# Patient Record
Sex: Female | Born: 1935
Health system: Southern US, Community
[De-identification: ages and names within clinical notes are randomized; demographics above are authoritative.]

## PROBLEM LIST (undated history)

## (undated) ENCOUNTER — Emergency Department (HOSPITAL_COMMUNITY): Admission: EM | Payer: Medicare HMO | Source: Home / Self Care

## (undated) DIAGNOSIS — G43909 Migraine, unspecified, not intractable, without status migrainosus: Secondary | ICD-10-CM

## (undated) DIAGNOSIS — L821 Other seborrheic keratosis: Secondary | ICD-10-CM

## (undated) DIAGNOSIS — M25569 Pain in unspecified knee: Secondary | ICD-10-CM

## (undated) DIAGNOSIS — E039 Hypothyroidism, unspecified: Secondary | ICD-10-CM

## (undated) DIAGNOSIS — F329 Major depressive disorder, single episode, unspecified: Secondary | ICD-10-CM

## (undated) DIAGNOSIS — B9681 Helicobacter pylori [H. pylori] as the cause of diseases classified elsewhere: Secondary | ICD-10-CM

## (undated) DIAGNOSIS — I471 Supraventricular tachycardia, unspecified: Secondary | ICD-10-CM

## (undated) DIAGNOSIS — M1990 Unspecified osteoarthritis, unspecified site: Secondary | ICD-10-CM

## (undated) DIAGNOSIS — K589 Irritable bowel syndrome without diarrhea: Secondary | ICD-10-CM

## (undated) DIAGNOSIS — Z780 Asymptomatic menopausal state: Secondary | ICD-10-CM

## (undated) DIAGNOSIS — L309 Dermatitis, unspecified: Secondary | ICD-10-CM

## (undated) DIAGNOSIS — M5137 Other intervertebral disc degeneration, lumbosacral region: Secondary | ICD-10-CM

## (undated) DIAGNOSIS — R0602 Shortness of breath: Secondary | ICD-10-CM

## (undated) DIAGNOSIS — J189 Pneumonia, unspecified organism: Secondary | ICD-10-CM

## (undated) DIAGNOSIS — D229 Melanocytic nevi, unspecified: Secondary | ICD-10-CM

## (undated) DIAGNOSIS — N898 Other specified noninflammatory disorders of vagina: Secondary | ICD-10-CM

## (undated) DIAGNOSIS — E739 Lactose intolerance, unspecified: Secondary | ICD-10-CM

## (undated) DIAGNOSIS — R7611 Nonspecific reaction to tuberculin skin test without active tuberculosis: Secondary | ICD-10-CM

## (undated) DIAGNOSIS — I639 Cerebral infarction, unspecified: Secondary | ICD-10-CM

## (undated) DIAGNOSIS — F32A Depression, unspecified: Secondary | ICD-10-CM

## (undated) DIAGNOSIS — K921 Melena: Secondary | ICD-10-CM

## (undated) DIAGNOSIS — Z9109 Other allergy status, other than to drugs and biological substances: Secondary | ICD-10-CM

## (undated) DIAGNOSIS — R112 Nausea with vomiting, unspecified: Secondary | ICD-10-CM

## (undated) DIAGNOSIS — I10 Essential (primary) hypertension: Secondary | ICD-10-CM

## (undated) DIAGNOSIS — Z79890 Hormone replacement therapy (postmenopausal): Secondary | ICD-10-CM

## (undated) DIAGNOSIS — E785 Hyperlipidemia, unspecified: Secondary | ICD-10-CM

## (undated) DIAGNOSIS — K279 Peptic ulcer, site unspecified, unspecified as acute or chronic, without hemorrhage or perforation: Secondary | ICD-10-CM

## (undated) DIAGNOSIS — C539 Malignant neoplasm of cervix uteri, unspecified: Secondary | ICD-10-CM

## (undated) DIAGNOSIS — H43819 Vitreous degeneration, unspecified eye: Secondary | ICD-10-CM

## (undated) DIAGNOSIS — K219 Gastro-esophageal reflux disease without esophagitis: Secondary | ICD-10-CM

## (undated) DIAGNOSIS — K5730 Diverticulosis of large intestine without perforation or abscess without bleeding: Secondary | ICD-10-CM

## (undated) DIAGNOSIS — R350 Frequency of micturition: Secondary | ICD-10-CM

## (undated) DIAGNOSIS — M51379 Other intervertebral disc degeneration, lumbosacral region without mention of lumbar back pain or lower extremity pain: Secondary | ICD-10-CM

## (undated) DIAGNOSIS — Z9889 Other specified postprocedural states: Secondary | ICD-10-CM

## (undated) HISTORY — DX: Nonspecific reaction to tuberculin skin test without active tuberculosis: R76.11

## (undated) HISTORY — DX: Helicobacter pylori (H. pylori) as the cause of diseases classified elsewhere: B96.81

## (undated) HISTORY — DX: Peptic ulcer, site unspecified, unspecified as acute or chronic, without hemorrhage or perforation: K27.9

## (undated) HISTORY — DX: Hyperlipidemia, unspecified: E78.5

## (undated) HISTORY — DX: Melanocytic nevi, unspecified: D22.9

## (undated) HISTORY — DX: Irritable bowel syndrome, unspecified: K58.9

## (undated) HISTORY — DX: Cerebral infarction, unspecified: I63.9

## (undated) HISTORY — PX: CATARACT EXTRACTION W/ INTRAOCULAR LENS IMPLANT: SHX1309

## (undated) HISTORY — DX: Melena: K92.1

## (undated) HISTORY — DX: Essential (primary) hypertension: I10

## (undated) HISTORY — DX: Other seborrheic keratosis: L82.1

## (undated) HISTORY — DX: Hormone replacement therapy: Z79.890

## (undated) HISTORY — DX: Supraventricular tachycardia, unspecified: I47.10

## (undated) HISTORY — DX: Unspecified osteoarthritis, unspecified site: M19.90

## (undated) HISTORY — DX: Malignant neoplasm of cervix uteri, unspecified: C53.9

## (undated) HISTORY — DX: Asymptomatic menopausal state: Z78.0

## (undated) HISTORY — DX: Other specified noninflammatory disorders of vagina: N89.8

## (undated) HISTORY — PX: EYE SURGERY: SHX253

## (undated) HISTORY — DX: Supraventricular tachycardia: I47.1

## (undated) HISTORY — DX: Vitreous degeneration, unspecified eye: H43.819

## (undated) HISTORY — DX: Hypothyroidism, unspecified: E03.9

## (undated) HISTORY — DX: Pain in unspecified knee: M25.569

## (undated) HISTORY — PX: COLONOSCOPY: SHX174

## (undated) HISTORY — DX: Frequency of micturition: R35.0

## (undated) HISTORY — DX: Diverticulosis of large intestine without perforation or abscess without bleeding: K57.30

---

## 1980-04-17 HISTORY — PX: TOTAL ABDOMINAL HYSTERECTOMY W/ BILATERAL SALPINGOOPHORECTOMY: SHX83

## 1980-04-17 HISTORY — PX: ABDOMINAL HYSTERECTOMY: SHX81

## 1994-04-17 DIAGNOSIS — H43819 Vitreous degeneration, unspecified eye: Secondary | ICD-10-CM

## 1994-04-17 HISTORY — DX: Vitreous degeneration, unspecified eye: H43.819

## 1997-09-22 ENCOUNTER — Encounter: Admission: RE | Admit: 1997-09-22 | Discharge: 1997-09-22 | Payer: Self-pay | Admitting: Family Medicine

## 1997-09-30 ENCOUNTER — Encounter: Admission: RE | Admit: 1997-09-30 | Discharge: 1997-09-30 | Payer: Self-pay | Admitting: Family Medicine

## 1997-12-22 ENCOUNTER — Emergency Department (HOSPITAL_COMMUNITY): Admission: EM | Admit: 1997-12-22 | Discharge: 1997-12-22 | Payer: Self-pay | Admitting: Emergency Medicine

## 1998-02-03 ENCOUNTER — Encounter: Admission: RE | Admit: 1998-02-03 | Discharge: 1998-02-03 | Payer: Self-pay | Admitting: Family Medicine

## 1998-04-14 ENCOUNTER — Encounter: Admission: RE | Admit: 1998-04-14 | Discharge: 1998-04-14 | Payer: Self-pay | Admitting: Sports Medicine

## 1998-09-24 ENCOUNTER — Encounter: Admission: RE | Admit: 1998-09-24 | Discharge: 1998-09-24 | Payer: Self-pay | Admitting: Family Medicine

## 1998-12-22 ENCOUNTER — Encounter: Admission: RE | Admit: 1998-12-22 | Discharge: 1998-12-22 | Payer: Self-pay | Admitting: Family Medicine

## 1999-01-11 ENCOUNTER — Encounter: Admission: RE | Admit: 1999-01-11 | Discharge: 1999-01-11 | Payer: Self-pay | Admitting: Family Medicine

## 1999-02-01 ENCOUNTER — Encounter: Admission: RE | Admit: 1999-02-01 | Discharge: 1999-02-01 | Payer: Self-pay | Admitting: Sports Medicine

## 1999-04-05 ENCOUNTER — Encounter: Admission: RE | Admit: 1999-04-05 | Discharge: 1999-04-05 | Payer: Self-pay | Admitting: Sports Medicine

## 1999-05-20 ENCOUNTER — Encounter: Admission: RE | Admit: 1999-05-20 | Discharge: 1999-05-20 | Payer: Self-pay | Admitting: Family Medicine

## 1999-05-20 ENCOUNTER — Encounter: Payer: Self-pay | Admitting: Sports Medicine

## 1999-05-20 ENCOUNTER — Encounter: Admission: RE | Admit: 1999-05-20 | Discharge: 1999-05-20 | Payer: Self-pay | Admitting: *Deleted

## 1999-05-21 ENCOUNTER — Emergency Department (HOSPITAL_COMMUNITY): Admission: EM | Admit: 1999-05-21 | Discharge: 1999-05-21 | Payer: Self-pay | Admitting: Emergency Medicine

## 1999-05-22 ENCOUNTER — Emergency Department (HOSPITAL_COMMUNITY): Admission: EM | Admit: 1999-05-22 | Discharge: 1999-05-22 | Payer: Self-pay | Admitting: Emergency Medicine

## 1999-05-30 ENCOUNTER — Ambulatory Visit (HOSPITAL_COMMUNITY): Admission: RE | Admit: 1999-05-30 | Discharge: 1999-05-30 | Payer: Self-pay | Admitting: *Deleted

## 1999-07-22 ENCOUNTER — Ambulatory Visit (HOSPITAL_COMMUNITY): Admission: RE | Admit: 1999-07-22 | Discharge: 1999-07-22 | Payer: Self-pay | Admitting: Cardiology

## 1999-08-26 ENCOUNTER — Encounter: Payer: Self-pay | Admitting: Cardiology

## 1999-08-26 ENCOUNTER — Ambulatory Visit (HOSPITAL_COMMUNITY): Admission: RE | Admit: 1999-08-26 | Discharge: 1999-08-26 | Payer: Self-pay | Admitting: Cardiology

## 2000-03-23 ENCOUNTER — Encounter: Admission: RE | Admit: 2000-03-23 | Discharge: 2000-03-23 | Payer: Self-pay | Admitting: Family Medicine

## 2000-05-08 ENCOUNTER — Encounter: Admission: RE | Admit: 2000-05-08 | Discharge: 2000-05-08 | Payer: Self-pay | Admitting: Family Medicine

## 2000-05-25 ENCOUNTER — Encounter: Admission: RE | Admit: 2000-05-25 | Discharge: 2000-05-25 | Payer: Self-pay | Admitting: Family Medicine

## 2000-05-31 ENCOUNTER — Encounter: Payer: Self-pay | Admitting: Sports Medicine

## 2000-05-31 ENCOUNTER — Ambulatory Visit (HOSPITAL_COMMUNITY): Admission: RE | Admit: 2000-05-31 | Discharge: 2000-05-31 | Payer: Self-pay | Admitting: Sports Medicine

## 2001-06-27 ENCOUNTER — Ambulatory Visit (HOSPITAL_COMMUNITY): Admission: RE | Admit: 2001-06-27 | Discharge: 2001-06-27 | Payer: Self-pay | Admitting: Sports Medicine

## 2001-10-10 ENCOUNTER — Emergency Department (HOSPITAL_COMMUNITY): Admission: EM | Admit: 2001-10-10 | Discharge: 2001-10-11 | Payer: Self-pay | Admitting: Emergency Medicine

## 2001-10-11 ENCOUNTER — Encounter: Payer: Self-pay | Admitting: Emergency Medicine

## 2001-10-15 ENCOUNTER — Encounter (INDEPENDENT_AMBULATORY_CARE_PROVIDER_SITE_OTHER): Payer: Self-pay | Admitting: *Deleted

## 2001-10-15 LAB — CONVERTED CEMR LAB

## 2001-11-08 ENCOUNTER — Encounter: Admission: RE | Admit: 2001-11-08 | Discharge: 2001-11-08 | Payer: Self-pay | Admitting: Family Medicine

## 2002-05-29 ENCOUNTER — Encounter: Admission: RE | Admit: 2002-05-29 | Discharge: 2002-05-29 | Payer: Self-pay | Admitting: Family Medicine

## 2002-06-03 ENCOUNTER — Encounter: Payer: Self-pay | Admitting: Sports Medicine

## 2002-06-03 ENCOUNTER — Encounter: Admission: RE | Admit: 2002-06-03 | Discharge: 2002-06-03 | Payer: Self-pay | Admitting: Sports Medicine

## 2002-06-23 ENCOUNTER — Encounter: Admission: RE | Admit: 2002-06-23 | Discharge: 2002-06-23 | Payer: Self-pay | Admitting: Family Medicine

## 2003-03-11 ENCOUNTER — Encounter: Admission: RE | Admit: 2003-03-11 | Discharge: 2003-03-11 | Payer: Self-pay | Admitting: Family Medicine

## 2003-04-22 ENCOUNTER — Ambulatory Visit (HOSPITAL_COMMUNITY): Admission: RE | Admit: 2003-04-22 | Discharge: 2003-04-22 | Payer: Self-pay | Admitting: Family Medicine

## 2003-04-22 ENCOUNTER — Encounter: Admission: RE | Admit: 2003-04-22 | Discharge: 2003-04-22 | Payer: Self-pay | Admitting: Sports Medicine

## 2003-05-06 ENCOUNTER — Encounter: Admission: RE | Admit: 2003-05-06 | Discharge: 2003-05-06 | Payer: Self-pay | Admitting: Family Medicine

## 2004-05-20 ENCOUNTER — Ambulatory Visit: Payer: Self-pay | Admitting: Family Medicine

## 2004-05-22 ENCOUNTER — Emergency Department (HOSPITAL_COMMUNITY): Admission: EM | Admit: 2004-05-22 | Discharge: 2004-05-22 | Payer: Self-pay | Admitting: Family Medicine

## 2004-05-27 ENCOUNTER — Ambulatory Visit (HOSPITAL_COMMUNITY): Admission: RE | Admit: 2004-05-27 | Discharge: 2004-05-27 | Payer: Self-pay | Admitting: Family Medicine

## 2004-06-26 ENCOUNTER — Emergency Department (HOSPITAL_COMMUNITY): Admission: EM | Admit: 2004-06-26 | Discharge: 2004-06-26 | Payer: Self-pay | Admitting: Family Medicine

## 2004-08-09 ENCOUNTER — Ambulatory Visit: Payer: Self-pay | Admitting: Cardiology

## 2004-08-19 ENCOUNTER — Encounter: Payer: Self-pay | Admitting: Family Medicine

## 2004-08-19 ENCOUNTER — Ambulatory Visit: Payer: Self-pay

## 2004-12-22 ENCOUNTER — Emergency Department (HOSPITAL_COMMUNITY): Admission: EM | Admit: 2004-12-22 | Discharge: 2004-12-22 | Payer: Self-pay | Admitting: Emergency Medicine

## 2005-05-24 ENCOUNTER — Encounter: Admission: RE | Admit: 2005-05-24 | Discharge: 2005-05-24 | Payer: Self-pay | Admitting: Family Medicine

## 2005-05-24 ENCOUNTER — Ambulatory Visit: Payer: Self-pay | Admitting: Family Medicine

## 2005-07-10 ENCOUNTER — Ambulatory Visit (HOSPITAL_COMMUNITY): Admission: RE | Admit: 2005-07-10 | Discharge: 2005-07-10 | Payer: Self-pay | Admitting: Sports Medicine

## 2005-07-27 ENCOUNTER — Ambulatory Visit: Payer: Self-pay | Admitting: Family Medicine

## 2005-08-21 ENCOUNTER — Encounter: Admission: RE | Admit: 2005-08-21 | Discharge: 2005-08-21 | Payer: Self-pay | Admitting: Sports Medicine

## 2005-08-28 ENCOUNTER — Ambulatory Visit: Payer: Self-pay | Admitting: Sports Medicine

## 2005-08-28 ENCOUNTER — Ambulatory Visit: Payer: Self-pay | Admitting: Cardiology

## 2005-09-20 ENCOUNTER — Encounter: Admission: RE | Admit: 2005-09-20 | Discharge: 2005-12-19 | Payer: Self-pay | Admitting: Sports Medicine

## 2005-09-28 ENCOUNTER — Ambulatory Visit: Payer: Self-pay | Admitting: Family Medicine

## 2005-10-27 ENCOUNTER — Encounter: Admission: RE | Admit: 2005-10-27 | Discharge: 2005-10-27 | Payer: Self-pay | Admitting: Sports Medicine

## 2005-10-30 ENCOUNTER — Ambulatory Visit: Payer: Self-pay | Admitting: Family Medicine

## 2005-12-11 ENCOUNTER — Ambulatory Visit: Payer: Self-pay | Admitting: Family Medicine

## 2005-12-21 ENCOUNTER — Ambulatory Visit: Payer: Self-pay | Admitting: Family Medicine

## 2006-01-24 ENCOUNTER — Ambulatory Visit: Payer: Self-pay | Admitting: Sports Medicine

## 2006-02-15 ENCOUNTER — Ambulatory Visit: Payer: Self-pay | Admitting: Sports Medicine

## 2006-02-21 ENCOUNTER — Ambulatory Visit: Payer: Self-pay | Admitting: Family Medicine

## 2006-02-26 ENCOUNTER — Emergency Department (HOSPITAL_COMMUNITY): Admission: EM | Admit: 2006-02-26 | Discharge: 2006-02-26 | Payer: Self-pay | Admitting: Family Medicine

## 2006-06-14 DIAGNOSIS — I471 Supraventricular tachycardia, unspecified: Secondary | ICD-10-CM | POA: Insufficient documentation

## 2006-06-14 DIAGNOSIS — M170 Bilateral primary osteoarthritis of knee: Secondary | ICD-10-CM | POA: Insufficient documentation

## 2006-06-14 DIAGNOSIS — M1990 Unspecified osteoarthritis, unspecified site: Secondary | ICD-10-CM

## 2006-06-14 DIAGNOSIS — M5137 Other intervertebral disc degeneration, lumbosacral region: Secondary | ICD-10-CM | POA: Insufficient documentation

## 2006-06-14 DIAGNOSIS — M25569 Pain in unspecified knee: Secondary | ICD-10-CM | POA: Insufficient documentation

## 2006-06-14 HISTORY — DX: Supraventricular tachycardia, unspecified: I47.10

## 2006-06-15 ENCOUNTER — Encounter (INDEPENDENT_AMBULATORY_CARE_PROVIDER_SITE_OTHER): Payer: Self-pay | Admitting: *Deleted

## 2006-10-04 ENCOUNTER — Encounter (INDEPENDENT_AMBULATORY_CARE_PROVIDER_SITE_OTHER): Payer: Self-pay | Admitting: Family Medicine

## 2006-10-05 ENCOUNTER — Ambulatory Visit: Payer: Self-pay | Admitting: Family Medicine

## 2006-10-05 ENCOUNTER — Encounter (INDEPENDENT_AMBULATORY_CARE_PROVIDER_SITE_OTHER): Payer: Self-pay | Admitting: Family Medicine

## 2006-10-05 LAB — CONVERTED CEMR LAB: TSH: 4.135 microintl units/mL (ref 0.350–5.50)

## 2006-11-12 ENCOUNTER — Ambulatory Visit: Payer: Self-pay | Admitting: Cardiology

## 2007-02-08 ENCOUNTER — Ambulatory Visit: Payer: Self-pay | Admitting: Family Medicine

## 2007-02-08 ENCOUNTER — Encounter (INDEPENDENT_AMBULATORY_CARE_PROVIDER_SITE_OTHER): Payer: Self-pay | Admitting: Family Medicine

## 2007-02-08 LAB — CONVERTED CEMR LAB
Cholesterol: 228 mg/dL — ABNORMAL HIGH (ref 0–200)
LDL Cholesterol: 139 mg/dL — ABNORMAL HIGH (ref 0–99)
Triglycerides: 229 mg/dL — ABNORMAL HIGH (ref <150)

## 2007-02-11 ENCOUNTER — Telehealth (INDEPENDENT_AMBULATORY_CARE_PROVIDER_SITE_OTHER): Payer: Self-pay | Admitting: Family Medicine

## 2007-02-11 ENCOUNTER — Telehealth: Payer: Self-pay | Admitting: *Deleted

## 2007-02-21 ENCOUNTER — Ambulatory Visit: Payer: Self-pay | Admitting: Cardiology

## 2007-02-26 ENCOUNTER — Encounter (INDEPENDENT_AMBULATORY_CARE_PROVIDER_SITE_OTHER): Payer: Self-pay | Admitting: Family Medicine

## 2007-03-07 ENCOUNTER — Ambulatory Visit: Payer: Self-pay | Admitting: Cardiology

## 2007-03-28 ENCOUNTER — Ambulatory Visit: Payer: Self-pay | Admitting: Family Medicine

## 2007-04-26 ENCOUNTER — Ambulatory Visit: Payer: Self-pay | Admitting: Gastroenterology

## 2007-04-26 LAB — CONVERTED CEMR LAB
Basophils Relative: 0.5 % (ref 0.0–1.0)
Hemoglobin: 13.4 g/dL (ref 12.0–15.0)
Lymphocytes Relative: 38.1 % (ref 12.0–46.0)
MCV: 85.7 fL (ref 78.0–100.0)
Monocytes Absolute: 0.6 10*3/uL (ref 0.2–0.7)
Monocytes Relative: 8.8 % (ref 3.0–11.0)
Neutro Abs: 3.2 10*3/uL (ref 1.4–7.7)

## 2007-05-01 ENCOUNTER — Ambulatory Visit: Payer: Self-pay | Admitting: Cardiology

## 2007-05-01 LAB — CONVERTED CEMR LAB
ALT: 16 units/L (ref 0–35)
Alkaline Phosphatase: 59 units/L (ref 39–117)
Cholesterol: 156 mg/dL (ref 0–200)
LDL Cholesterol: 85 mg/dL (ref 0–99)
Total Protein: 7.1 g/dL (ref 6.0–8.3)
VLDL: 25 mg/dL (ref 0–40)

## 2007-05-06 ENCOUNTER — Encounter: Payer: Self-pay | Admitting: Gastroenterology

## 2007-05-06 ENCOUNTER — Encounter (INDEPENDENT_AMBULATORY_CARE_PROVIDER_SITE_OTHER): Payer: Self-pay | Admitting: Family Medicine

## 2007-05-06 ENCOUNTER — Ambulatory Visit: Payer: Self-pay | Admitting: Gastroenterology

## 2007-05-06 DIAGNOSIS — K5730 Diverticulosis of large intestine without perforation or abscess without bleeding: Secondary | ICD-10-CM | POA: Insufficient documentation

## 2007-05-21 ENCOUNTER — Ambulatory Visit: Payer: Self-pay | Admitting: Gastroenterology

## 2007-05-21 LAB — CONVERTED CEMR LAB
Fecal Occult Blood: NEGATIVE
OCCULT 3: NEGATIVE
OCCULT 4: NEGATIVE

## 2007-06-04 ENCOUNTER — Ambulatory Visit: Payer: Self-pay | Admitting: Cardiology

## 2007-11-14 ENCOUNTER — Ambulatory Visit: Payer: Self-pay | Admitting: Family Medicine

## 2007-11-14 ENCOUNTER — Telehealth: Payer: Self-pay | Admitting: *Deleted

## 2007-11-14 LAB — CONVERTED CEMR LAB
Glucose, Urine, Semiquant: NEGATIVE
Specific Gravity, Urine: 1.015
WBC Urine, dipstick: NEGATIVE
pH: 7.5

## 2007-11-18 ENCOUNTER — Ambulatory Visit: Payer: Self-pay | Admitting: Cardiology

## 2007-12-24 ENCOUNTER — Ambulatory Visit: Payer: Self-pay | Admitting: Family Medicine

## 2007-12-24 ENCOUNTER — Encounter (INDEPENDENT_AMBULATORY_CARE_PROVIDER_SITE_OTHER): Payer: Self-pay | Admitting: Family Medicine

## 2007-12-24 DIAGNOSIS — L821 Other seborrheic keratosis: Secondary | ICD-10-CM | POA: Insufficient documentation

## 2007-12-24 LAB — CONVERTED CEMR LAB
BUN: 10 mg/dL (ref 6–23)
Calcium: 9.1 mg/dL (ref 8.4–10.5)
Glucose, Bld: 87 mg/dL (ref 70–99)
TSH: 4.185 microintl units/mL (ref 0.350–4.50)

## 2007-12-25 ENCOUNTER — Encounter (INDEPENDENT_AMBULATORY_CARE_PROVIDER_SITE_OTHER): Payer: Self-pay | Admitting: Family Medicine

## 2008-01-22 ENCOUNTER — Ambulatory Visit: Payer: Self-pay | Admitting: Family Medicine

## 2008-05-13 ENCOUNTER — Ambulatory Visit: Payer: Self-pay | Admitting: Cardiology

## 2008-05-13 ENCOUNTER — Ambulatory Visit (HOSPITAL_COMMUNITY): Admission: RE | Admit: 2008-05-13 | Discharge: 2008-05-13 | Payer: Self-pay | Admitting: Family Medicine

## 2008-05-13 LAB — CONVERTED CEMR LAB
ALT: 13 units/L (ref 0–35)
AST: 20 units/L (ref 0–37)
Bilirubin, Direct: 0.1 mg/dL (ref 0.0–0.3)
Total Bilirubin: 0.5 mg/dL (ref 0.3–1.2)
VLDL: 17 mg/dL (ref 0–40)

## 2008-05-21 ENCOUNTER — Ambulatory Visit: Payer: Self-pay | Admitting: Cardiology

## 2008-05-25 ENCOUNTER — Ambulatory Visit: Payer: Self-pay | Admitting: Family Medicine

## 2008-05-25 LAB — CONVERTED CEMR LAB: Rapid Strep: NEGATIVE

## 2008-06-01 ENCOUNTER — Encounter (INDEPENDENT_AMBULATORY_CARE_PROVIDER_SITE_OTHER): Payer: Self-pay | Admitting: Family Medicine

## 2008-06-02 ENCOUNTER — Telehealth (INDEPENDENT_AMBULATORY_CARE_PROVIDER_SITE_OTHER): Payer: Self-pay | Admitting: Family Medicine

## 2008-06-03 ENCOUNTER — Encounter: Admission: RE | Admit: 2008-06-03 | Discharge: 2008-06-03 | Payer: Self-pay | Admitting: Family Medicine

## 2008-06-03 ENCOUNTER — Ambulatory Visit: Payer: Self-pay | Admitting: Family Medicine

## 2008-06-03 DIAGNOSIS — I10 Essential (primary) hypertension: Secondary | ICD-10-CM | POA: Insufficient documentation

## 2008-06-09 ENCOUNTER — Encounter: Payer: Self-pay | Admitting: *Deleted

## 2008-06-17 ENCOUNTER — Ambulatory Visit: Payer: Self-pay | Admitting: Family Medicine

## 2008-11-06 ENCOUNTER — Encounter (INDEPENDENT_AMBULATORY_CARE_PROVIDER_SITE_OTHER): Payer: Self-pay | Admitting: *Deleted

## 2008-12-15 ENCOUNTER — Ambulatory Visit: Payer: Self-pay | Admitting: Cardiology

## 2009-01-27 ENCOUNTER — Ambulatory Visit: Payer: Self-pay | Admitting: Family Medicine

## 2009-04-16 ENCOUNTER — Emergency Department (HOSPITAL_COMMUNITY): Admission: EM | Admit: 2009-04-16 | Discharge: 2009-04-16 | Payer: Self-pay | Admitting: Emergency Medicine

## 2009-06-08 ENCOUNTER — Ambulatory Visit: Payer: Self-pay | Admitting: Family Medicine

## 2009-06-23 ENCOUNTER — Ambulatory Visit: Payer: Self-pay

## 2009-06-23 ENCOUNTER — Encounter: Payer: Self-pay | Admitting: Family Medicine

## 2009-06-28 DIAGNOSIS — E559 Vitamin D deficiency, unspecified: Secondary | ICD-10-CM | POA: Insufficient documentation

## 2009-06-28 LAB — CONVERTED CEMR LAB
Albumin: 4.4 g/dL (ref 3.5–5.2)
Alkaline Phosphatase: 65 units/L (ref 39–117)
BUN: 7 mg/dL (ref 6–23)
CO2: 26 meq/L (ref 19–32)
Cholesterol: 143 mg/dL (ref 0–200)
Glucose, Bld: 98 mg/dL (ref 70–99)
HDL: 51 mg/dL (ref >39)
LDL Cholesterol: 71 mg/dL (ref 0–99)
Potassium: 4.7 meq/L (ref 3.5–5.3)
Total Bilirubin: 0.3 mg/dL (ref 0.3–1.2)
Triglycerides: 106 mg/dL (ref <150)

## 2009-07-26 ENCOUNTER — Ambulatory Visit (HOSPITAL_COMMUNITY): Admission: RE | Admit: 2009-07-26 | Discharge: 2009-07-26 | Payer: Self-pay | Admitting: Family Medicine

## 2009-10-08 ENCOUNTER — Encounter: Payer: Self-pay | Admitting: Family Medicine

## 2009-10-08 ENCOUNTER — Ambulatory Visit: Payer: Self-pay | Admitting: Family Medicine

## 2009-10-08 DIAGNOSIS — R0602 Shortness of breath: Secondary | ICD-10-CM | POA: Insufficient documentation

## 2009-10-14 ENCOUNTER — Encounter: Admission: RE | Admit: 2009-10-14 | Discharge: 2009-10-14 | Payer: Self-pay | Admitting: Family Medicine

## 2009-10-15 ENCOUNTER — Encounter: Payer: Self-pay | Admitting: Family Medicine

## 2009-10-26 ENCOUNTER — Ambulatory Visit: Payer: Self-pay | Admitting: Family Medicine

## 2009-10-26 ENCOUNTER — Encounter: Payer: Self-pay | Admitting: Family Medicine

## 2009-10-26 LAB — CONVERTED CEMR LAB
HCT: 38 % (ref 36.0–46.0)
Hemoglobin: 12.9 g/dL (ref 12.0–15.0)
MCV: 84.1 fL (ref 78.0–100.0)
Platelets: 179 10*3/uL (ref 150–400)
WBC: 7.2 10*3/uL (ref 4.0–10.5)

## 2009-11-30 ENCOUNTER — Ambulatory Visit: Payer: Self-pay | Admitting: Family Medicine

## 2010-01-12 ENCOUNTER — Ambulatory Visit: Payer: Self-pay | Admitting: Cardiology

## 2010-02-07 ENCOUNTER — Ambulatory Visit: Payer: Self-pay | Admitting: Family Medicine

## 2010-02-08 ENCOUNTER — Ambulatory Visit: Payer: Self-pay | Admitting: Family Medicine

## 2010-02-17 ENCOUNTER — Ambulatory Visit: Payer: Self-pay | Admitting: Family Medicine

## 2010-03-07 ENCOUNTER — Encounter: Payer: Self-pay | Admitting: *Deleted

## 2010-03-08 ENCOUNTER — Observation Stay (HOSPITAL_COMMUNITY): Admission: EM | Admit: 2010-03-08 | Discharge: 2010-03-08 | Payer: Self-pay | Admitting: Emergency Medicine

## 2010-03-08 ENCOUNTER — Encounter (INDEPENDENT_AMBULATORY_CARE_PROVIDER_SITE_OTHER): Payer: Self-pay | Admitting: Emergency Medicine

## 2010-03-08 ENCOUNTER — Telehealth: Payer: Self-pay | Admitting: Family Medicine

## 2010-03-21 ENCOUNTER — Encounter: Payer: Self-pay | Admitting: Family Medicine

## 2010-03-21 ENCOUNTER — Ambulatory Visit: Payer: Self-pay | Admitting: Family Medicine

## 2010-03-22 ENCOUNTER — Encounter: Admission: RE | Admit: 2010-03-22 | Discharge: 2010-03-22 | Payer: Self-pay | Admitting: Family Medicine

## 2010-03-25 ENCOUNTER — Telehealth: Payer: Self-pay | Admitting: Family Medicine

## 2010-03-31 ENCOUNTER — Telehealth: Payer: Self-pay | Admitting: Family Medicine

## 2010-03-31 ENCOUNTER — Telehealth: Payer: Self-pay | Admitting: *Deleted

## 2010-04-12 ENCOUNTER — Ambulatory Visit
Admission: RE | Admit: 2010-04-12 | Discharge: 2010-04-12 | Payer: Self-pay | Source: Home / Self Care | Attending: Family Medicine | Admitting: Family Medicine

## 2010-04-15 ENCOUNTER — Telehealth: Payer: Self-pay | Admitting: *Deleted

## 2010-04-19 ENCOUNTER — Telehealth: Payer: Self-pay | Admitting: *Deleted

## 2010-04-22 ENCOUNTER — Telehealth: Payer: Self-pay | Admitting: Family Medicine

## 2010-04-27 ENCOUNTER — Ambulatory Visit: Admission: RE | Admit: 2010-04-27 | Discharge: 2010-04-27 | Payer: Self-pay | Source: Home / Self Care

## 2010-05-02 ENCOUNTER — Encounter: Payer: Self-pay | Admitting: Family Medicine

## 2010-05-02 ENCOUNTER — Ambulatory Visit
Admission: RE | Admit: 2010-05-02 | Discharge: 2010-05-02 | Payer: Self-pay | Source: Home / Self Care | Attending: Family Medicine | Admitting: Family Medicine

## 2010-05-08 ENCOUNTER — Encounter: Payer: Self-pay | Admitting: Sports Medicine

## 2010-05-09 ENCOUNTER — Encounter
Admission: RE | Admit: 2010-05-09 | Discharge: 2010-05-09 | Payer: Self-pay | Source: Home / Self Care | Attending: Family Medicine | Admitting: Family Medicine

## 2010-05-16 ENCOUNTER — Ambulatory Visit
Admission: RE | Admit: 2010-05-16 | Discharge: 2010-05-16 | Payer: Self-pay | Source: Home / Self Care | Attending: Family Medicine | Admitting: Family Medicine

## 2010-05-17 NOTE — Assessment & Plan Note (Signed)
Summary: Meet new PCP/tcb   Vital Signs:  Patient profile:   77 year old female Weight:      148.1 pounds Temp:     97.7 degrees F oral Pulse rate:   57 / minute Pulse rhythm:   regular BP sitting:   145 / 84  (left arm) Cuff size:   regular  Vitals Entered By: Loralee Pacas CMA (June 08, 2009 1:52 PM)  Primary Care Provider:  Delbert Harness MD   History of Present Illness: 77 yo F here to meet new PCP for follow-up- has not been seen in over a year.  HRT:  Hysterectomy at age 77.  has been taking hormone therapy since then.  Has been tapering down.  has been out for 2 months.  Hot flashes have been not tolerable in this time period.  would like to restart.  HYPERTENSION/SVT Meds: Taking and tolerating? yes Home BP's: no Chest Pain: no Dyspnea: no SVT: No palpitations  Hypothyroid:  has been taking sythroid daily.  Has some fatigue but otherwise has been stable on doseage for a while.  No constipation, palpitations, heat or cold intolerance, change in hair or nails.  HYPERLIPIDEMIA Meds: simvastatin 20mg   Taking and tolerating? yes    Current Medications (verified): 1)  Atenolol 25 Mg Tabs (Atenolol) .... One Daily 2)  Nasacort Aq 55 Mcg/act Aers (Triamcinolone Acetonide(Nasal)) .... Spray 2 Spray As Directed Once A Day 3)  Omeprazole 10 Mg Cpdr (Omeprazole) .... Take 1 Capsule By Mouth Once A Day 4)  Premarin 0.3 Mg Tabs (Estrogens Conjugated) .... Take 1 Tablet By Mouth Every 3 Days. 5)  Synthroid 50 Mcg Tabs (Levothyroxine Sodium) .... Take 1 Tablet By Mouth Once A Day 6)  Fexofenadine Hcl 180 Mg Tabs (Fexofenadine Hcl) .... One By Mouth Daily 7)  Simvastatin 20 Mg Tabs (Simvastatin) .... One Tablet Qhs  Allergies: 1)  ! Sulfa 2)  ! Doxycycline PMH-FH-SH reviewed-no changes except otherwise noted  Review of Systems      See HPI General:  Complains of fatigue; denies fever and weight loss. CV:  Denies chest pain or discomfort, palpitations, shortness of  breath with exertion, and swelling of feet. Resp:  Denies cough and shortness of breath. GI:  Denies change in bowel habits. GU:  Denies incontinence. Endo:  Denies cold intolerance and heat intolerance.  Physical Exam  General:  Well developed, well nourished, in no acute distress. Neck:  Neck supple, no JVD. No masses, thyromegaly or abnormal cervical nodes. Lungs:  Clear bilaterally to auscultation and percussion. Heart:  Normal rate and regular rhythm. S1 and S2 normal without gallop, murmur, click, rub or other extra sounds. Extremities:  no edema   Impression & Recommendations:  Problem # 1:  HRT (ICD-V07.4)  Discussed long term HRT and risks with patient's FH of breast cancer.  Patient understands this but feels her quality of life is too  compromised without this.  Patient states she had been weaning with previous PCP.  Patient agreeable to reifll today at previous dose taking it every 3 days.  Given ed handout on obehaviorsal/non-hormonal ways to releive hot flashes.  Patient will try and see if she is able to wean, will discuss at next visit.  Orders: [facility]- Est  Level 4 (60454)  Problem # 2:  HYPERTENSION, BENIGN ESSENTIAL (ICD-401.1)  Near goal.  Continue current mgmt.  Her updated medication list for this problem includes:    Atenolol 25 Mg Tabs (Atenolol) ..... One daily  Orders:  [facility]- Est  Level 4 (99214)Future Orders: Comp Met-[facility] ([id number]) ... 05/26/2010 Lipid-[facility] ([id number]) ... 06/03/2010 Vit D, 25 OH-[facility] ([id number]) ... 06/03/2010  Problem # 3:  OSTEOPENIA (ICD-733.90)  Patient had dexa < 2 years ago.  To continue Calcium/Vit D.  Future Orders: Vit D, 25 OH-[facility] ([id number]) ... 06/03/2010  Problem # 4:  HYPOTHYROIDISM, UNSPECIFIED (ICD-244.9)  Will recheck as it has been over 1 year.  Contnue current dose.  Her updated medication list for this problem includes:    Synthroid 50 Mcg Tabs (Levothyroxine sodium) .Marland Kitchen... Take 1 tablet by mouth once  a day  Labs Reviewed: TSH: 2.34 (05/21/2008)    Chol: 144 (05/13/2008)   HDL: 40.7 (05/13/2008)   LDL: 87 (05/13/2008)   TG: 83 (05/13/2008)  Orders: [facility]- Est  Level 4 (99214)Future Orders: Comp Met-[facility] ([id number]) ... 05/26/2010 TSH-[facility] [id number]) ... 05/20/2010  Problem # 5:  Preventive Health Care (ICD-V70.0) Patient to inquire about zostavax.  Reminded to make mammogram appt.  Complete Medication List: 1)  Atenolol 25 Mg Tabs (Atenolol) .... One daily 2)  Nasacort Aq 55 Mcg/act Aers (Triamcinolone acetonide(nasal)) .... Spray 2 spray as directed once a day 3)  Omeprazole 10 Mg Cpdr (Omeprazole) .... Take 1 capsule by mouth once a day 4)  Premarin 0.3 Mg Tabs (Estrogens conjugated) .... Take 1 tablet by mouth every 3 days. 5)  Synthroid 50 Mcg Tabs (Levothyroxine sodium) .... Take 1 tablet by mouth once a day 6)  Fexofenadine Hcl 180 Mg Tabs (Fexofenadine hcl) .... One by mouth daily 7)  Simvastatin 20 Mg Tabs (Simvastatin) .... One tablet qhs  Patient Instructions: 1)  Continue to take calcium and vitamin D Supplement. 2)  Come back for fasting blood work. 3)  Work on tapering premarin. 4)  Miaralax for constipation. 5)  Make mammogram appt. 6)  follow-up in 2-3 months. Prescriptions: SIMVASTATIN 20 MG TABS (SIMVASTATIN) one tablet QHS  #30 x 3   Entered and Authorized by:   Delbert Harness MD   Signed by:   Delbert Harness MD on 06/08/2009   Method used:   Electronically to        [facility]  [place] [address] (retail)       [address]       [place], [place]  [address]       Ph: [phone] or [phone]       Fax: [phone]   RxID:   [id number] SYNTHROID 50 MCG TABS (LEVOTHYROXINE SODIUM) Take 1 tablet by mouth once a day  #30 x 3   Entered and Authorized by:   Delbert Harness MD   Signed by:   Delbert Harness MD on 06/08/2009   Method used:   [facility] to        [facility]  [place] [address] (retail)       [address]       [place], [place]  [address]       Ph:  [phone] or [phone]       Fax: [phone]   RxID:   [id number] FEXOFENADINE HCL 180 MG TABS (FEXOFENADINE HCL) one by mouth daily  #90 x 3   Entered and Authorized by:   Delbert Harness MD   Signed by:   Delbert Harness MD on 06/08/2009   Method used:   [facility]        [facility]  [place] [phone][facility] (retail)       [address]  [place], [place]  [address]       Ph: [phone] or [phone]       Fax: [phone]   RxID:   [id number] OMEPRAZOLE 10 MG CPDR (OMEPRAZOLE) Take 1 capsule by mouth once a day  #90 x 3   Entered and Authorized by:   Delbert Harness MD   Signed by:   Delbert Harness MD on 06/08/2009   Method used:   [facility]        [facility]  [place] [address] (retail)       [address]       [place], [place]  [address]       Ph: [phone] or [phone]       Fax: [phone]   RxID:   [id number] PREMARIN 0.3 MG TABS (ESTROGENS CONJUGATED) Take 1 tablet by mouth every 3 days.  #10 x 1   Entered and Authorized by:   Delbert Harness MD   Signed by:   Delbert Harness MD on 06/08/2009   Method used:   [facility]        [facility]  [place] [address] (retail)       [address]       [place], [place]  [address]       Ph: [phone] or [phone]       Fax: [phone]   RxID:   [id number]   Last Flu Vaccine:  Fluvax MCR (01/27/2009 11:58:52 AM) Flu Vaccine Result Date:  12/16/2008 Flu Vaccine Result:  given Flu Vaccine Next Due:  1 yr

## 2010-05-17 NOTE — Assessment & Plan Note (Signed)
Summary: dyspnea/kh   Vital Signs:  Patient profile:   77 year old female Height:      61 inches Weight:      151.2 pounds BMI:     28.67 O2 Sat:      95 % on Room air Temp:     98.2 degrees F oral Pulse rate:   108 / minute BP sitting:   109 / 65  (left arm) Cuff size:   small  Vitals Entered By: Gladstone Pih (October 26, 2009 3:35 PM)  O2 Flow:  Room air CC: dyspnea Is Patient Diabetic? No Pain Assessment Patient in pain? no        Primary Care Provider:  Delbert Harness MD  CC:  dyspnea.  History of Present Illness: 77 yo with PMH of PSVT here to discuss chronic dypnea  had been noticing increasing dyspnea over past several years.  25 pack year history but has not smoked in over 20 years.  Described as "cant' catch my breath" when walking.  Also had some trouble breathing in very hot weather such as on the drive here in a car without air conditioning.    Decreasing exercise tolerance- feels she has to stop sooner when going to the mailbox.    ROS positive for cough with sputum at times.  Mild dependent LE edema.  neg for PND, claudication, chest pain, diaphoresis, lightheadedness.  Habits & Providers  Alcohol-Tobacco-Diet     Tobacco Status: never  Current Medications (verified): 1)  Atenolol 25 Mg Tabs (Atenolol) .... One Daily 2)  Nasacort Aq 55 Mcg/act Aers (Triamcinolone Acetonide(Nasal)) .... Spray 2 Spray As Directed Once A Day 3)  Omeprazole 10 Mg Cpdr (Omeprazole) .... Take 1 Capsule By Mouth Once A Day 4)  Synthroid 50 Mcg Tabs (Levothyroxine Sodium) .... Take 1 Tablet By Mouth Once A Day 5)  Fexofenadine Hcl 180 Mg Tabs (Fexofenadine Hcl) .... One By Mouth Daily 6)  Simvastatin 20 Mg Tabs (Simvastatin) .... One Tablet Qhs 7)  Zostavax 47425 Unt/0.68ml Solr (Zoster Vaccine Live) .... Handwritten Prescription  Allergies: 1)  ! Sulfa 2)  ! Doxycycline PMH-FH-SH reviewed-no changes except otherwise noted  Social History: tob quit 1982, 25 pack yr hx, no  drugs/etoh; married X 33 years, 3 grown children NSVD; 1 great grandichild.  husband smokes, outdoor Medical laboratory scientific officer; retired from [employer] ge[employer]; lives in trailer in [place] with husband.   eats lots of veggies.    Review of Systems      See HPI  Physical Exam  General:  Well developed, well nourished, in no acute distress.  here with her husband Neck:  no JVD Lungs:  Clear bilaterally to auscultation and percussion. Heart:  Normal rate and regular rhythm. S1 and S2 normal without gallop, murmur, click, rub or other extra sounds. Pulses:  minimal LE edema Additional Exam:  Pulse ox at rest- 95%, on walking around clinic 90%   Impression & Recommendations:  Problem # 1:  DYSPNEA (ICD-786.05) Chronic, gradually progressibe.  Echo normal several years ago and normal cardiolite in 2001.  No cardiomegaly on CXR 2010.  Pulse ox decreased on ambulation.  Will get CBC, TSH to rule out easily reversible causes of dyspnea.  Will schedule PFT in pharmacy clinic.  Patient has appt with cardiology next month and will discuss SOB at that time and need for ECHO.  Orders: CBC-[facility] ([id number]) TSH-[facility] ([id number]) [facility]- Est Level  3 (88416)  Complete Medication List: 1)  Atenolol 25 Mg Tabs (Atenolol) .... One  daily 2)  Nasacort Aq 55 Mcg/act Aers (Triamcinolone acetonide(nasal)) .... Spray 2 spray as directed once a day 3)  Omeprazole 10 Mg Cpdr (Omeprazole) .... Take 1 capsule by mouth once a day 4)  Synthroid 50 Mcg Tabs (Levothyroxine sodium) .... Take 1 tablet by mouth once a day 5)  Fexofenadine Hcl 180 Mg Tabs (Fexofenadine hcl) .... One by mouth daily 6)  Simvastatin 20 Mg Tabs (Simvastatin) .... One tablet qhs 7)  Zostavax 30160 Unt/0.4ml Solr (Zoster vaccine live) .... Handwritten prescription  Patient Instructions: 1)  Make appointment to see Dr. Raymondo Band for Pulmonary Function Testing 2)  Follow-up with cardiologist to evaluate dyspnea  Appended Document: ECHO  Records obtained from  cardiologist.   Clinical Lists Changes  Observations: Added new observation of PAST SURG HX: B hand films osteoarth, no erosions, no RA - 05/26/2005 BMI:27 (144 pounds, 61.5 inches) - 03/13/2003 Cardiolite:  EF 69%, no ischemia - 07/17/1999,  ECHO 08/2004 Normal LV fxn, trace MR and TR DEXA:  spine T  -2.0, L hip  -1.4 - 04/26/2003, L foot film wnl - 05/26/2005, R heel scan:  T score -0.93 - 01/16/1999 R shoulder:  degen change of AC joint - 05/18/2002 TAH -cervical CA, normal paps since - 04/17/1980 (10/28/2009 11:13) Added new observation of PRIMARY MD: Delbert Harness MD (10/28/2009 11:13)        Past History:  Past Surgical History: B hand films osteoarth, no erosions, no RA - 05/26/2005 BMI:27 (144 pounds, 61.5 inches) - 03/13/2003 Cardiolite:  EF 69%, no ischemia - 07/17/1999,  ECHO 08/2004 Normal LV fxn, trace MR and TR DEXA:  spine T  -2.0, L hip  -1.4 - 04/26/2003, L foot film wnl - 05/26/2005, R heel scan:  T score -0.93 - 01/16/1999 R shoulder:  degen change of AC joint - 05/18/2002 TAH -cervical CA, normal paps since - 04/17/1980

## 2010-05-17 NOTE — Progress Notes (Signed)
Summary: Leg pain followup    Phone Note From Other Clinic   Reason for Call: Schedule Patient Appt, Diagnosis Check Summary of Call: Recieved page from ED about pt seen for leg pain. LE dopplers negative for DVT. However, pain still persistent. Pt will be recieving short term course of narcotics for pain. EDP requesting followup for leg pain.  Doree Albee MD March 08, 2010 9:30 AM

## 2010-05-17 NOTE — Assessment & Plan Note (Signed)
Summary: leg pain,df   Vital Signs:  Patient profile:   77 year old female Weight:      154 pounds Temp:     97.5 degrees F oral Pulse rate:   53 / minute Pulse rhythm:   regular BP sitting:   140 / 73  (left arm) Cuff size:   regular  Vitals Entered By: Loralee Pacas CMA (March 21, 2010 4:39 PM) CC: left leg pain   Primary Care Provider:  Delbert Harness MD  CC:  left leg pain.  History of Present Illness: 77 yo withh several weeks of leg pain  no new injury or overuse.  Has some chronic back and hip pain but this is different.  Went to ER for this and was given pain medications after negative doppler for DVT.  Worse with sitting.  No morning stiffness.  No symptoms of claudication.  Pain in anterior shin area.  No weakness.  htn: was asked by cardiologist to take lisinopril 10mg  but she did not start as she was not sure she needed it.  BP is  high again today.  Current Medications (verified): 1)  Atenolol 25 Mg Tabs (Atenolol) .... 1/2 Pill Am and 1/2 Pill Pm 2)  Omeprazole 10 Mg Cpdr (Omeprazole) .... Take 1 Capsule By Mouth Once A Day 3)  Synthroid 50 Mcg Tabs (Levothyroxine Sodium) .... Take 1 Tablet By Mouth Once A Day 4)  Fexofenadine Hcl 180 Mg Tabs (Fexofenadine Hcl) .... One By Mouth Daily 5)  Simvastatin 20 Mg Tabs (Simvastatin) .... One Tablet Qhs 6)  Zostavax 32440 Unt/0.49ml Solr (Zoster Vaccine Live) .... Handwritten Prescription 7)  Lisinopril 10 Mg Tabs (Lisinopril) .... Take One Tablet By Mouth Daily 8)  Flonase 50 Mcg/act Susp (Fluticasone Propionate) .... 2 Squirts in Each Nostril Daily 9)  Amoxicillin 875 Mg Tabs (Amoxicillin) .... One Tab Two Times A Day For 7 Days 10)  Vicodin 5-500 Mg Tabs (Hydrocodone-Acetaminophen) .... One Tablet Every 6 Hours As Needed For Pain  Allergies: 1)  ! Sulfa 2)  ! Doxycycline PMH-FH-SH reviewed for relevance  Review of Systems      See HPI  Physical Exam  General:  Well-developed,well-nourished,in no acute  distress; alert,appropriate and cooperative throughout examination Extremities:  Left leg 39.5 cm at calf which is smaller than unaffected leg at 40.5 cm.  normal sensation,strength, and bloodflow.   tender to palpation along tibia   Impression & Recommendations:  Problem # 1:  LEG PAIN, LEFT (ICD-729.5) Unusual presentation of pain.  ESR normal suggests noninflammatory etiology.  Xrays obtained and shows no fractures as well as no bony lesions.  Up to date on cancer screening.  Will treat pain symptomatically.  Will follow-up in 1 month or sooner if worsens.  Orders: Radiology other (Radiology Other) Sed Rate (ESR)-[facility] [phone])  Complete Medication List: 1)  Atenolol 25 Mg Tabs (Atenolol) .... 1/2 pill am and 1/2 pill pm 2)  Omeprazole 10 Mg Cpdr (Omeprazole) .... Take 1 capsule by mouth once a day 3)  Synthroid 50 Mcg Tabs (Levothyroxine sodium) .... Take 1 tablet by mouth once a day 4)  Fexofenadine Hcl 180 Mg Tabs (Fexofenadine hcl) .... One by mouth daily 5)  Simvastatin 20 Mg Tabs (Simvastatin) .... One tablet qhs 6)  Zostavax 53664 Unt/0.24ml Solr (Zoster vaccine live) .... Handwritten prescription 7)  Lisinopril 10 Mg Tabs (Lisinopril) .... Take one tablet by mouth daily 8)  Flonase 50 Mcg/act Susp (Fluticasone propionate) .... 2 squirts in each nostril daily  9)  Amoxicillin 875 Mg Tabs (Amoxicillin) .... One tab two times a day for 7 days 10)  Vicodin 5-500 Mg Tabs (Hydrocodone-acetaminophen) .... One tablet every 6 hours as needed for pain  Patient Instructions: 1)  take one half tablet of your lisinopril and follow-up for labwork in 10 days 2)  Will get xray 3)  follow-up if no improvement in pain in 1 month Prescriptions: VICODIN 5-500 MG TABS (HYDROCODONE-ACETAMINOPHEN) one tablet every 6 hours as needed for pain  #20 x 0   Entered and Authorized by:   Delbert Harness MD   Signed by:   Delbert Harness MD on 03/21/2010   Method used:   Print then Give to Patient   RxID:    [id number]    Orders Added: 1)  Radiology other [Radiology Other] 2)  Sed Rate (ESR)-[facility] [33295]     Prevention & Chronic Care Immunizations   Influenza vaccine: Fluvax MCR  (02/17/2010)   Influenza vaccine due: 12/16/2009    Tetanus booster: 06/16/2002: Done.   Tetanus booster due: 06/15/2012    Pneumococcal vaccine: Done.  (05/23/2004)   Pneumococcal vaccine due: None    H. zoster vaccine: Not documented  Colorectal Screening   Hemoccult: Done.  (05/23/2004)   Hemoccult due: Not Indicated    Colonoscopy: polyp, diverticulosis  (04/18/2007)   Colonoscopy due: 04/17/2012  Other Screening   Pap smear: s/p TAH /BSO  (12/24/2007)   Pap smear due: Not Indicated    Mammogram: BI-RADS CATEGORY 1:  Negative.^MM DIGITAL DIAGNOSTIC UNILAT R  (10/14/2009)   Mammogram due: 05/13/2009    DXA bone density scan: abnormal - osteopenic with mod frx risk [facility]  (05/13/2008)   DXA scan due: None    Smoking status: quit  (02/08/2010)  Lipids   Total Cholesterol: 143  (06/23/2009)   LDL: 71  (06/23/2009)   LDL Direct: Not documented   HDL: 51  (06/23/2009)   Triglycerides: 106  (06/23/2009)    SGOT (AST): 13  (06/23/2009)   SGPT (ALT): 8  (06/23/2009)   Alkaline phosphatase: 65  (06/23/2009)   Total bilirubin: 0.3  (06/23/2009)  Hypertension   Last Blood Pressure: 140 / 73  (03/21/2010)   Serum creatinine: 0.80  (06/23/2009)   Serum potassium 4.7  (06/23/2009)  Self-Management Support :   Personal Goals (by the next clinic visit) :      Personal blood pressure goal: 140/90  (11/30/2009)     Personal LDL goal: 130  (11/30/2009)    Hypertension self-management support: Written self-care plan  (11/30/2009)    Lipid self-management support: Written self-care plan  (11/30/2009)     Appended Document: Orders Update     Clinical Lists Changes  Problems: Assessed HYPERTENSION, BENIGN ESSENTIAL as comment only - advised if does not want to take  10 mg of lisinopril cardiology prescribed, would be reasonable to start with 1/2 tab.  Wil recheck Cr 10 days after taking new ACE-I.  Her updated medication list for this problem includes:    Atenolol 25 Mg Tabs (Atenolol) .Marland Kitchen... 1/2 pill am and 1/2 pill pm    Lisinopril 10 Mg Tabs (Lisinopril) .Marland Kitchen... Take one tablet by mouth daily  Orders: Added new Test order of [facility]- Est Level  3 (18841) - Signed Observations: Added new observation of PRIMARY MD: Delbert Harness MD (03/24/2010 20:45) Added new observation of HTN PROGRESS: Unchanged (03/24/2010 20:45) Added new observation of HTN FSREVIEW: Yes (03/24/2010 20:45) Added new observation of DM PROGRESS: N/A (03/24/2010 20:45) Added  new observation of DM FSREVIEW: N/A (03/24/2010 20:45)       Prevention & Chronic Care Immunizations   Influenza vaccine: Fluvax MCR  (02/17/2010)   Influenza vaccine due: 12/16/2009    Tetanus booster: 06/16/2002: Done.   Tetanus booster due: 06/15/2012    Pneumococcal vaccine: Done.  (05/23/2004)   Pneumococcal vaccine due: None    H. zoster vaccine: Not documented  Colorectal Screening   Hemoccult: Done.  (05/23/2004)   Hemoccult due: Not Indicated    Colonoscopy: polyp, diverticulosis  (04/18/2007)   Colonoscopy due: 04/17/2012  Other Screening   Pap smear: s/p TAH /BSO  (12/24/2007)   Pap smear due: Not Indicated    Mammogram: BI-RADS CATEGORY 1:  Negative.^MM DIGITAL DIAGNOSTIC UNILAT R  (10/14/2009)   Mammogram due: 05/13/2009    DXA bone density scan: abnormal - osteopenic with mod frx risk ([facility])  (05/13/2008)   DXA scan due: None    Smoking status: quit  (02/08/2010)  Lipids   Total Cholesterol: 143  (06/23/2009)   LDL: 71  (06/23/2009)   LDL Direct: Not documented   HDL: 51  (06/23/2009)   Triglycerides: 106  (06/23/2009)    SGOT (AST): 13  (06/23/2009)   SGPT (ALT): 8  (06/23/2009)   Alkaline phosphatase: 65  (06/23/2009)   Total bilirubin: 0.3   (06/23/2009)  Hypertension   Last Blood Pressure: 140 / 73  (03/21/2010)   Serum creatinine: 0.80  (06/23/2009)   Serum potassium 4.7  (06/23/2009)    Hypertension flowsheet reviewed?: Yes   Progress toward BP goal: Unchanged  Self-Management Support :   Personal Goals (by the next clinic visit) :      Personal blood pressure goal: 140/90  (11/30/2009)     Personal LDL goal: 130  (11/30/2009)    Hypertension self-management support: Written self-care plan  (11/30/2009)    Lipid self-management support: Written self-care plan  (11/30/2009)      Impression & Recommendations:  Problem # 1:  HYPERTENSION, BENIGN ESSENTIAL (ICD-401.1)  advised if does not want to take 10 mg of lisinopril cardiology prescribed, would be reasonable to start with 1/2 tab.  Wil recheck Cr 10 days after taking new ACE-I.  Her updated medication list for this problem includes:    Atenolol 25 Mg Tabs (Atenolol) .Marland Kitchen... 1/2 pill am and 1/2 pill pm    Lisinopril 10 Mg Tabs (Lisinopril) .Marland Kitchen... Take one tablet by mouth daily  Orders: [facility]- Est Level  3 (63016)  Complete Medication List: 1)  Atenolol 25 Mg Tabs (Atenolol) .... 1/2 pill am and 1/2 pill pm 2)  Omeprazole 10 Mg Cpdr (Omeprazole) .... Take 1 capsule by mouth once a day 3)  Synthroid 50 Mcg Tabs (Levothyroxine sodium) .... Take 1 tablet by mouth once a day 4)  Fexofenadine Hcl 180 Mg Tabs (Fexofenadine hcl) .... One by mouth daily 5)  Simvastatin 20 Mg Tabs (Simvastatin) .... One tablet qhs 6)  Zostavax 01093 Unt/0.19ml Solr (Zoster vaccine live) .... Handwritten prescription 7)  Lisinopril 10 Mg Tabs (Lisinopril) .... Take one tablet by mouth daily 8)  Flonase 50 Mcg/act Susp (Fluticasone propionate) .... 2 squirts in each nostril daily 9)  Amoxicillin 875 Mg Tabs (Amoxicillin) .... One tab two times a day for 7 days 10)  Vicodin 5-500 Mg Tabs (Hydrocodone-acetaminophen) .... One tablet every 6 hours as needed for pain

## 2010-05-17 NOTE — Letter (Signed)
Summary: Results Follow-up Letter  [facility]  [address]   [place], [place] [address]   Phone: [phone]  Fax: [phone]    10/15/2009  [address] [facility] [address] [place], [place]  [address]  Dear Ms. Ozanich,   The following are the results of your mammogram:  No evidence of malignancy is identified in the right breast. Bilateral screening mammogram in April 2012 is recommended.  I look forward to seeing you soon.  Sincerely,  Delbert Harness MD [facility]           Appended Document: Results Follow-up Letter mailed

## 2010-05-17 NOTE — Assessment & Plan Note (Signed)
Summary: PFT's  Rx Clinic   Vital Signs:  Patient profile:   77 year old female Height:      61 inches Weight:      151 pounds BMI:     28.63 Pulse rate:   51 / minute BP sitting:   142 / 79  (right arm)   Primary Care Provider:  Delbert Harness MD   History of Present Illness: Presents in good spirits.  WN, WD, NAD. A&O x 3.    CC: dyspnea on exertion that has gotten progressively worse over the past several weeks.  Has a history of dyspnea for several years, and mentioned that a doctor at one point said she possibly had emphysema.    Smoking: 25 pack year history (1 ppd x 25 years), but quit back in 1982.   Does not currently use any medications to manage dyspnea.  Reports that most dyspnea occurs when she is walking to her mailbox, which is at the top of her inclined driveway.  She does have to stop to catch her breath when she walks to the mailbox. Reports not exercising due to inability to drive to convenient locations to walk.  Husband is in poor health and cannot drive her either.    At the office we walked with patient around the building three times while monitoring O2 saturation.  Oxygenation dropped to 92% and pulse rose into the 70s, at which point patient reported to be short of breath.  After stopping, O2 saturation rose and pulse decreased to 55.    Current Medications (verified): 1)  Atenolol 25 Mg Tabs (Atenolol) .... 1/2 Pill Am and 1/2 Pill Pm 2)  Nasacort Aq 55 Mcg/act Aers (Triamcinolone Acetonide(Nasal)) .... Spray 2 Spray As Directed Once A Day 3)  Omeprazole 10 Mg Cpdr (Omeprazole) .... Take 1 Capsule By Mouth Once A Day 4)  Synthroid 50 Mcg Tabs (Levothyroxine Sodium) .... Take 1 Tablet By Mouth Once A Day 5)  Fexofenadine Hcl 180 Mg Tabs (Fexofenadine Hcl) .... One By Mouth Daily 6)  Simvastatin 20 Mg Tabs (Simvastatin) .... One Tablet Qhs 7)  Zostavax 14782 Unt/0.47ml Solr (Zoster Vaccine Live) .... Handwritten Prescription  Allergies (verified): 1)  !  Sulfa 2)  ! Doxycycline   Impression & Recommendations:  Problem # 1:  DYSPNEA (ICD-786.05) Assessment Unchanged  Spirometry evaluation without bronchodilator use reveals normal to very slightly impaired lung function.  Patient has been experiencing dyspnea on exertion for several weeks and currently taking no medication for relief.  Continue current treatment plan at this time.  Reviewed results of pulmonary function tests.  Pt verbalized understanding of results.  Written pt instructions provided.  F/U with Dr. Earnest Bailey in 4-6 weeks to assess blood pressure medications as well as pulse.    TTFFC:45 minutes.  Patient seen with: Kennieth Francois, PharmD candidate.  Orders: PFT Basline & Lung Volume (PFT Baseline-Lung  V)  Problem # 2:  HYPERTENSION, BENIGN ESSENTIAL (ICD-401.1) Assessment: Unchanged Blood pressure today in clinic is 142/79. SBP slightly elevated compared to goal of < 140/90.  However, pulse today is 51, compared with pulse of 108 October 26, 2009.  Patient walked around the building three times, and HR increased into the 70s. Pt reported some dyspnea at this point.  Low pulse could be contributing to dyspnea.  Spoke with Dr. Earnest Bailey about this issue.  At this time, continue atenolol 25 mg 1/2 tab am and 1/2 tab pm, and follow-up with Dr. Earnest Bailey in 4 to 6  weeks to assess pulse, BP, and dyspnea.    Her updated medication list for this problem includes:    Atenolol 25 Mg Tabs (Atenolol) .Marland Kitchen... 1/2 pill am and 1/2 pill pm  Complete Medication List: 1)  Atenolol 25 Mg Tabs (Atenolol) .... 1/2 pill am and 1/2 pill pm 2)  Nasacort Aq 55 Mcg/act Aers (Triamcinolone acetonide(nasal)) .... Spray 2 spray as directed once a day 3)  Omeprazole 10 Mg Cpdr (Omeprazole) .... Take 1 capsule by mouth once a day 4)  Synthroid 50 Mcg Tabs (Levothyroxine sodium) .... Take 1 tablet by mouth once a day 5)  Fexofenadine Hcl 180 Mg Tabs (Fexofenadine hcl) .... One by mouth daily 6)  Simvastatin 20 Mg  Tabs (Simvastatin) .... One tablet qhs 7)  Zostavax 16109 Unt/0.10ml Solr (Zoster vaccine live) .... Handwritten prescription  Patient Instructions: 1)  Near Normal Lung Function Test. 2)  Keep walking as much as possible at a comfortable pace.  3)  Next visit with Dr. Earnest Bailey in 4-6 weeks. Prescriptions: ATENOLOL 25 MG TABS (ATENOLOL) 1/2 pill AM and 1/2 pill PM  #30 x 0   Entered and Authorized by:   Christian Mate D   Signed by:   Madelon Lips Pharm D on 11/30/2009   Method used:   Historical   RxID:   [id number]    Pulmonary Function Test Date: 11/30/2009 Height (in.): 61 Gender: Female  Pre-Spirometry FVC    Value: 2.62 L/min   Pred: 2.54 L/min     % Pred: 103 % FEV1    Value: 1.98 L     Pred: 1.96 L     % Pred: 100 % FEV1/FVC  Value: 75 %     Pred: 79 %     % Pred: 95 % FEF 25-75  Value: 1.52 L/min   Pred: 1.82 L/min     % Pred: 83 %  Comments: Effort:  Good with training  (Third effort was best).   Evaluation: normal - improved   Prevention & Chronic Care Immunizations   Influenza vaccine: Fluvax MCR  (01/27/2009)   Influenza vaccine due: 12/16/2009    Tetanus booster: 06/16/2002: Done.   Tetanus booster due: 06/15/2012    Pneumococcal vaccine: Done.  (05/23/2004)   Pneumococcal vaccine due: None    H. zoster vaccine: Not documented  Colorectal Screening   Hemoccult: Done.  (05/23/2004)   Hemoccult due: Not Indicated    Colonoscopy: polyp, diverticulosis  (04/18/2007)   Colonoscopy due: 04/17/2012  Other Screening   Pap smear: s/p TAH /BSO  (12/24/2007)   Pap smear due: Not Indicated    Mammogram: BI-RADS CATEGORY 1:  Negative.^MM DIGITAL DIAGNOSTIC UNILAT R  (10/14/2009)   Mammogram due: 05/13/2009    DXA bone density scan: abnormal - osteopenic with mod frx risk [facility]  (05/13/2008)   DXA scan due: None    Smoking status: never  (10/26/2009)  Lipids   Total Cholesterol: 143  (06/23/2009)   LDL: 71  (06/23/2009)   LDL  Direct: Not documented   HDL: 51  (06/23/2009)   Triglycerides: 106  (06/23/2009)    SGOT (AST): 13  (06/23/2009)   SGPT (ALT): 8  (06/23/2009)   Alkaline phosphatase: 65  (06/23/2009)   Total bilirubin: 0.3  (06/23/2009)    Lipid flowsheet reviewed?: Yes   Progress toward LDL goal: At goal  Hypertension   Last Blood Pressure: 142 / 79  (11/30/2009)   Serum creatinine: 0.80  (06/23/2009)   Serum potassium 4.7  (  06/23/2009)    Hypertension flowsheet reviewed?: Yes   Progress toward BP goal: Unchanged  Self-Management Support :   Personal Goals (by the next clinic visit) :      Personal blood pressure goal: 140/90  (11/30/2009)     Personal LDL goal: 130  (11/30/2009)    Hypertension self-management support: Written self-care plan  (11/30/2009)   Hypertension self-care plan printed.    Lipid self-management support: Written self-care plan  (11/30/2009)   Lipid self-care plan printed.

## 2010-05-17 NOTE — Assessment & Plan Note (Signed)
Summary: WI for sinus infection   Vital Signs:  Patient profile:   77 year old female Weight:      152.5 pounds Temp:     98 degrees F oral Pulse rate:   58 / minute BP sitting:   149 / 78  (left arm) Cuff size:   regular  Vitals Entered By: Arlyss Repress CMA, (February 08, 2010 12:05 PM) CC: right ear pain/sore throat, congestion and cough x 3 weeks Is Patient Diabetic? No Pain Assessment Patient in pain? yes     Location: right ear Intensity: 6 Onset of pain  x 3 weeks   Primary Care Provider:  Delbert Harness MD  CC:  right ear pain/sore throat and congestion and cough x 3 weeks.  History of Present Illness: 3 weeks of sinus pressure, ear pain, cough, and headache.  She tried to hold out but feels that she needs antibotics.  She has a positive belief and past experience that this will help.  Use Allegra daily, had used nasal steroids in the past currently not using.  Habits & Providers  Alcohol-Tobacco-Diet     Tobacco Status: quit     Year Quit: 1982  Current Medications (verified): 1)  Atenolol 25 Mg Tabs (Atenolol) .... 1/2 Pill Am and 1/2 Pill Pm 2)  Omeprazole 10 Mg Cpdr (Omeprazole) .... Take 1 Capsule By Mouth Once A Day 3)  Synthroid 50 Mcg Tabs (Levothyroxine Sodium) .... Take 1 Tablet By Mouth Once A Day 4)  Fexofenadine Hcl 180 Mg Tabs (Fexofenadine Hcl) .... One By Mouth Daily 5)  Simvastatin 20 Mg Tabs (Simvastatin) .... One Tablet Qhs 6)  Zostavax 04540 Unt/0.67ml Solr (Zoster Vaccine Live) .... Handwritten Prescription 7)  Lisinopril 10 Mg Tabs (Lisinopril) .... Take One Tablet By Mouth Daily 8)  Flonase 50 Mcg/act Susp (Fluticasone Propionate) .... 2 Squirts in Each Nostril Daily 9)  Amoxicillin 875 Mg Tabs (Amoxicillin) .... One Tab Two Times A Day For 7 Days  Allergies: 1)  ! Sulfa 2)  ! Doxycycline  Social History: Smoking Status:  quit  Review of Systems General:  Denies chills, fatigue, fever, and malaise. ENT:  Complains of earache,  postnasal drainage, and sinus pressure; denies nasal congestion and sore throat. Resp:  Complains of cough; denies sputum productive and wheezing.  Physical Exam  General:  Well-developed,well-nourished,in no acute distress; alert,appropriate and cooperative throughout examination Ears:  TMs grey and retracted Nose:  mildly inflammed Mouth:  post nasal drainage, swollen tonsils (in 77 yr old) Neck:  No deformities, masses, or tenderness noted. Lungs:  normal respiratory effort and normal breath sounds.   Heart:  normal rate, regular rhythm, and no murmur.     Impression & Recommendations:  Problem # 1:  SINUSITIS, ACUTE (ICD-461.9) add nasal steroid, explaind to use for a least one month but may find it is helpful long term. Her updated medication list for this problem includes:    Flonase 50 Mcg/act Susp (Fluticasone propionate) .Marland Kitchen... 2 squirts in each nostril daily    Amoxicillin 875 Mg Tabs (Amoxicillin) ..... One tab two times a day for 7 days  Orders: [facility]- Est Level  3 (98119)  Complete Medication List: 1)  Atenolol 25 Mg Tabs (Atenolol) .... 1/2 pill am and 1/2 pill pm 2)  Omeprazole 10 Mg Cpdr (Omeprazole) .... Take 1 capsule by mouth once a day 3)  Synthroid 50 Mcg Tabs (Levothyroxine sodium) .... Take 1 tablet by mouth once a day 4)  Fexofenadine Hcl 180 Mg  Tabs (Fexofenadine hcl) .... One by mouth daily 5)  Simvastatin 20 Mg Tabs (Simvastatin) .... One tablet qhs 6)  Zostavax 29562 Unt/0.12ml Solr (Zoster vaccine live) .... Handwritten prescription 7)  Lisinopril 10 Mg Tabs (Lisinopril) .... Take one tablet by mouth daily 8)  Flonase 50 Mcg/act Susp (Fluticasone propionate) .... 2 squirts in each nostril daily 9)  Amoxicillin 875 Mg Tabs (Amoxicillin) .... One tab two times a day for 7 days  Patient Instructions: 1)  Please schedule a follow-up appointment as needed .  Prescriptions: AMOXICILLIN 875 MG TABS (AMOXICILLIN) one tab two times a day for 7 days  #14 x 0    Entered and Authorized by:   Luretha Murphy NP   Signed by:   Luretha Murphy NP on 02/08/2010   Method used:   [facility] to        [facility]  [place] [address] (retail)       [address]       [place], [place]  [address]       Ph: [phone] or [phone]       Fax: [phone]   RxID:   [id number] FLONASE 50 MCG/ACT SUSP (FLUTICASONE PROPIONATE) 2 squirts in each nostril daily  #1 x 6   Entered and Authorized by:   Luretha Murphy NP   Signed by:   Luretha Murphy NP on 02/08/2010   Method used:   [facility] to        [facility]  [place] [address] (retail)       [address]       [place], [place]  [address]       Ph: [phone] or [phone]       Fax: [phone]   RxID:   [id number]    Orders Added: 1)  [facility]- Est Level  3 [16073]

## 2010-05-17 NOTE — Assessment & Plan Note (Signed)
Summary: f 1 yr/nt  Medications Added LISINOPRIL 10 MG TABS (LISINOPRIL) Take one tablet by mouth daily        Visit Type:  1 yr f/u Primary Provider:  Delbert Harness MD  CC:  sob....edema/ankles....denies any cp.  History of Present Illness: Amy Rodriguez returns today for evaluation of a history of SVT, hypertension, and now some lower extremity edema.  She denies any recurrent clinical SVT. The atenolol twice a day as really made a difference. Resting heart rate runs in the 50s but she is not symptomatic.  She's had increasing lower extremity edema. She does use salt table. Her blood pressures are running in the 160 range systolic and around 90 diastolic.  She denies orthopnea or PND. She had  no angina or chest pain.  Current Medications (verified): 1)  Atenolol 25 Mg Tabs (Atenolol) .... 1/2 Pill Am and 1/2 Pill Pm 2)  Omeprazole 10 Mg Cpdr (Omeprazole) .... Take 1 Capsule By Mouth Once A Day 3)  Synthroid 50 Mcg Tabs (Levothyroxine Sodium) .... Take 1 Tablet By Mouth Once A Day 4)  Fexofenadine Hcl 180 Mg Tabs (Fexofenadine Hcl) .... One By Mouth Daily 5)  Simvastatin 20 Mg Tabs (Simvastatin) .... One Tablet Qhs 6)  Zostavax 16109 Unt/0.67ml Solr (Zoster Vaccine Live) .... Handwritten Prescription  Allergies: 1)  ! Sulfa 2)  ! 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 Vitreous detachment 1996 Multiple nevi--last derm evaluation 01/2003 postmenopausal s/p hyst for h/o Cervical CA '82 (both ovaries taken at that time) now on hormonal replacement   Past Surgical History: Last updated: 10/28/2009 B hand films osteoarth, no erosions, no RA - 05/26/2005 BMI:27 (144 pounds, 61.5 inches) - 03/13/2003 Cardiolite:  EF 69%, no ischemia - 07/17/1999,  ECHO 08/2004 Normal LV fxn, trace MR and TR DEXA:  spine T  -2.0, L hip  -1.4 - 04/26/2003, L foot film wnl - 05/26/2005, R heel scan:  T score -0.93 - 01/16/1999 R shoulder:  degen change of AC joint - 05/18/2002 TAH -cervical CA, normal paps since - 04/17/1980  Family History: Last updated: 12/24/2007 Breast CA mother in her 58s brother deceased lung CA 61 CHF in father no CAD,DM, HTN  Social History: Last updated: 10/26/2009 tob quit 1982, 25 pack yr hx, no drugs/etoh; married X 33 years, 3 grown children NSVD; 1 great grandichild.  husband smokes, outdoor Medical laboratory scientific officer; retired from [employer]; lives in trailer in [place] with husband.   eats lots of veggies.    Risk Factors: Smoking Status: never (10/26/2009)  Review of Systems       date of of history of present illness  Vital Signs:  Patient profile:   77 year old female Height:      61 inches Weight:      154 pounds BMI:     29.20 Pulse rate:   57 / minute Pulse rhythm:   irregular BP sitting:   154 /  90  (left arm) Cuff size:   large  Vitals Entered By: Danielle Rankin, CMA (January 12, 2010 4:40 PM)  Physical Exam  General:  Well developed, well nourished, in no acute distress. Head:  normocephalic and atraumatic Eyes:  PERRLA/EOM intact; conjunctiva and lids normal. Neck:  Neck supple, no JVD. No  masses, thyromegaly or abnormal cervical nodes. Chest [name]:  no deformities or breast masses noted Lungs:  Clear bilaterally to auscultation and percussion. Heart:  MI nondisplaced, regular rate and rhythm, no gallop, carotid strokes equal bilaterally without bruit Msk:  Back normal, normal gait. Muscle strength and tone normal. Pulses:  pulses normal in all 4 extremities Extremities:  1+ left pedal edema and 1+ right pedal edema.  1+ left pedal edema and 1+ right pedal edema.   Neurologic:  Alert and oriented x 3. Skin:  Intact without lesions or rashes. Psych:  Normal affect.   Problems:  Medical Problems Added: 1)  Dx of Edema  (ICD-782.3)  EKG  Procedure date:  01/12/2010  Findings:      sinus bradycardia first degree AV block, no other changes  EKG  Procedure date:  01/12/2010  Findings:      sinus bradycardia at, nonspecific ST segment changes no change.  Impression & Recommendations:  Problem # 1:  HYPERTENSION, BENIGN ESSENTIAL (ICD-401.1) Assessment Deteriorated I will start lisinopril 10 mg p.o. q. day with followup electrolytes in a week to 10 days. Salt restriction reinforced. Cannot use diuretic also sulfa allergy, specially HCTZ. Her updated medication list for this problem includes:    Atenolol 25 Mg Tabs (Atenolol) .Marland Kitchen... 1/2 pill am and 1/2 pill pm    Lisinopril 10 Mg Tabs (Lisinopril) .Marland Kitchen... Take one tablet by mouth daily  Orders: EKG w/ Interpretation (93000)  Problem # 2:  HYPERLIPIDEMIA (ICD-272.4)  Her updated medication list for this problem includes:    Simvastatin 20 Mg Tabs (Simvastatin) ..... One tablet qhs  Problem # 3:  TACHYCARDIA, PAROXYSMAL SUPRAVENTRICULAR (ICD-427.0) Assessment: Unchanged  Her updated medication list for this problem includes:    Atenolol 25 Mg Tabs (Atenolol) .Marland Kitchen... 1/2 pill am and 1/2 pill pm    Lisinopril 10 Mg Tabs (Lisinopril) .Marland Kitchen... Take one tablet by mouth daily  Problem # 4:  PALPITATIONS  (ICD-785.1) Assessment: Improved  Her updated medication list for this problem includes:    Atenolol 25 Mg Tabs (Atenolol) .Marland Kitchen... 1/2 pill am and 1/2 pill pm    Lisinopril 10 Mg Tabs (Lisinopril) .Marland Kitchen... Take one tablet by mouth daily  Orders: EKG w/ Interpretation (93000)  Problem # 5:  EDEMA (ICD-782.3) Assessment: Deteriorated  Patient Instructions: 1)  Your physician recommends that you schedule a follow-up appointment in: 1 year with Dr. Daleen Squibb 2)  Your physician has recommended you make the following change in your medication:  3)  Reduce your salt intake.  Remember to elevate your legs when sitting.  This will help reduce swelling. 4)  Your physician has requested that you regularly monitor and record your blood pressure readings at home.  Please use the same machine at the same time of day to check your readings and record them to bring to your follow-up visit. Goal blood pressure 135-140 systolic over 80-85 diastolic. 5)  Return in 10 days for lab work.  Bmet 401.9 Prescriptions: LISINOPRIL 10 MG TABS (LISINOPRIL) Take one tablet by mouth daily  #30 x 11   Entered by:   Lisabeth Devoid RN   Authorized by:   Gaylord Shih,  MD, [facility]   Signed by:   Lisabeth Devoid RN on 01/12/2010   Method used:   [facility]        [facility]  [place] [address] [facility]       [address]       [place], [place]  [address]       Ph: [phone] or [phone]       Fax: [phone]   RxID:   [id number]

## 2010-05-17 NOTE — Miscellaneous (Signed)
Summary: triage call   Clinical Lists Changes Angelique Blonder took a call from  patient  wanting to schedule appointment for leg pain and appointment was scheduled for tomorrow . Angelique Blonder ask me to call patient to discuss. Patient states she has been having this pain for a week . she rates the pain at 10/10.  states she has arthritis in knee but this is  a different pain  involving whole leg. states leg is somewhat swollen. advised patient that she needs to be seen today and advised her to go to Urgent Care or ED. states she has no way to get there.  advised her to try to get someone to take her but if no other way call  EMS. consulted Dr. Leveda Anna and  he agrees this is a reasonable solution. patient notified.  Theresia Lo RN  March 07, 2010 5:03 PM

## 2010-05-17 NOTE — Assessment & Plan Note (Signed)
Summary: FLU SHOT/KH   Nurse Visit   Allergies: 1)  ! Sulfa 2)  ! Doxycycline  Immunizations Administered:  Influenza Vaccine # 1:    Vaccine Type: Fluvax MCR    Site: right deltoid    Mfr: Gl[employer]oSmithKline    Dose: 0.5 ml    Route: IM    Given by: Theresia Lo RN    Exp. Date: 10/12/2010    Lot #: [id number]    VIS given: 11/09/09 version given February 17, 2010.  Flu Vaccine Consent Questions:    Do you have a history of severe allergic reactions to this vaccine? no    Any prior history of allergic reactions to egg and/or gelatin? no    Do you have a sensitivity to the preservative Thimersol? no    Do you have a past history of Guillan-Barre Syndrome? no    Do you currently have an acute febrile illness? no    Have you ever had a severe reaction to latex? no    Vaccine information given and explained to patient? yes    Are you currently pregnant? no  Orders Added: 1)  Influenza Vaccine MCR [00025] 2)  Administration Flu vaccine - MCR [G0008]

## 2010-05-17 NOTE — Assessment & Plan Note (Signed)
Summary: flu shot/eo   patient came in for flu vaccine  and she states she has had sore throat , earache and sinus cingestion for a  2-3 weeks. appointment scheduled tomorrow for work in appointment. will defer flu vaccine until that time. today arrived no charge. Theresia Lo RN  February 07, 2010 2:39 PM  Nurse Visit   Allergies: 1)  ! Sulfa 2)  ! Doxycycline  Orders Added: 1)  No Charge Patient Arrived (NCPA0) [NCPA0]

## 2010-05-17 NOTE — Assessment & Plan Note (Signed)
Summary: follow-up and breast tenderness,df   Vital Signs:  Patient profile:   77 year old female Height:      61 inches Weight:      149 pounds BMI:     28.26 BSA:     1.67 Temp:     97.9 degrees F Pulse rate:   49 / minute BP sitting:   135 / 76  Vitals Entered By: Jone Baseman CMA (October 08, 2009 2:38 PM) CC: CPE Is Patient Diabetic? No Pain Assessment Patient in pain? no        Primary Care Provider:  Delbert Harness MD  CC:  CPE.  History of Present Illness: 77 yo here to discuss breast tenderness, prevention, menopause  1.  breast tenderness:  breasts tender x 1-2 months.  no skin changes, lumps, nipple discharge.  had normal mammogram April 2011.  No changes felt on self breast exam.  2.  zostavax:  patient requests zostavax prescritpion.  Has not had vaccine or shingles before to her knowledge.  3.  Vitamin D deficiency:  completed course prescribed.  4.  IBS:  continues to have what she thinks is lactose intolerance of many years.  Also diagnosed with IBS which improves some with lactose avoidance.  Not currently taking any meds.  Asks about Hyocyamine.  5.  postmenopausal symptoms:  has discontinued premarin.  doing well.  6.  SOB:  has been for many years.  feels like exercise intolerance is gradually declining over the years.  no orthopnea.  No cough.  former smoker.  Habits & Providers  Alcohol-Tobacco-Diet     Tobacco Status: never  Current Medications (verified): 1)  Atenolol 25 Mg Tabs (Atenolol) .... One Daily 2)  Nasacort Aq 55 Mcg/act Aers (Triamcinolone Acetonide(Nasal)) .... Spray 2 Spray As Directed Once A Day 3)  Omeprazole 10 Mg Cpdr (Omeprazole) .... Take 1 Capsule By Mouth Once A Day 4)  Synthroid 50 Mcg Tabs (Levothyroxine Sodium) .... Take 1 Tablet By Mouth Once A Day 5)  Fexofenadine Hcl 180 Mg Tabs (Fexofenadine Hcl) .... One By Mouth Daily 6)  Simvastatin 20 Mg Tabs (Simvastatin) .... One Tablet Qhs 7)  Zostavax 27253 Unt/0.86ml Solr  (Zoster Vaccine Live) .... Handwritten Prescription  Allergies: 1)  ! Sulfa 2)  ! Doxycycline PMH-FH-SH reviewed-no changes except otherwise noted  Review of Systems      See HPI General:  Denies fever and weight loss. CV:  Complains of shortness of breath with exertion; denies chest pain or discomfort, difficulty breathing at night, difficulty breathing while lying down, lightheadness, and palpitations. Resp:  Complains of shortness of breath; denies cough. GI:  Complains of diarrhea; denies change in bowel habits.  Physical Exam  General:  Well developed, well nourished, in no acute distress.  here with her husband Breasts:  No mass, nodules, thickening, tenderness, bulging, retraction, inflamation, nipple discharge or skin changes noted.  tender lateral to areola - 9 oclock on right breast Lungs:  Clear bilaterally to auscultation and percussion. Heart:  Normal rate and regular rhythm. S1 and S2 normal without gallop, murmur, click, rub or other extra sounds. Extremities:  no LE edema   Impression & Recommendations:  Problem # 1:  BREAST TENDERNESS (ICD-611.71)  normal exam.  Normal mammogram 07/2009.  family history of breast ca.  Will refer for diagnostic mammogram.  Orders: [facility]- Est  Level 4 (66440) Mammogram (Diagnostic) (Mammo)  Problem # 2:  VITAMIN D DEFICIENCY (ICD-268.9) Will recheck today.  Orders: Vit D, 25  OH-[facility] [phone]) [facility]- Est  Level 4 ([address]11)  Problem # 3:  HRT (ICD-V07.4)  discontinued- have now tapered off  Orders: [facility]- Est  Level 4 (91478)  Problem # 4:  DYSPNEA (ICD-786.05)  patient attributes this to summer heat although seems to be pattern of slow decline over the years.  Last had ECHO per patient several years ago- normal.  Will request recordsfrom cardiology.  Also history of smoking but does not seem to have had any issues with COPD.  Will discuss further and workup at next appt.  may benefit from PFT's.  Orders: [facility]- Est  Level 4  ([address]214)  Problem # 5:  IRRITABLE BOWEL SYNDROME (ICD-564.1)  Not overly distressing to patient.  Will discuss further at future visit.  Orders: [facility]- Est  Level 4 ([address]562)  Complete Medication List: 1)  Atenolol 25 Mg Tabs (Atenolol) .... One daily 2)  Nasacort Aq 55 Mcg/act Aers (Triamcinolone acetonide(nasal)) .... Spray 2 spray as directed once a day 3)  Omeprazole 10 Mg Cpdr (Omeprazole) .... Take 1 capsule by mouth once a day 4)  Synthroid 50 Mcg Tabs (Levothyroxine sodium) .... Take 1 tablet by mouth once a day 5)  Fexofenadine Hcl 180 Mg Tabs (Fexofenadine hcl) .... One by mouth daily 6)  Simvastatin 20 Mg Tabs (Simvastatin) .... One tablet qhs 7)  Zostavax 13086 Unt/0.47ml Solr (Zoster vaccine live) .... Handwritten prescription  Patient Instructions: 1)  Will schedule you for mammogram- call us back if you dont get a call about appt. 2)  Make follow-up appt to further discuss SOB 3)  Will recheck Vitamin D at next visit Prescriptions: ZOSTAVAX 57846 UNT/0.65ML SOLR (ZOSTER VACCINE LIVE) Handwritten prescription  #1 x 0   Entered and Authorized by:   Delbert Harness MD   Signed by:   Delbert Harness MD on 10/08/2009   Method used:   Handwritten   RxID:   [id number]    Prevention & Chronic Care Immunizations   Influenza vaccine: Fluvax MCR  (01/27/2009)   Influenza vaccine due: 12/16/2009    Tetanus booster: 06/16/2002: Done.   Tetanus booster due: 06/15/2012    Pneumococcal vaccine: Done.  (05/23/2004)   Pneumococcal vaccine due: None    H. zoster vaccine: Not documented  Colorectal Screening   Hemoccult: Done.  (05/23/2004)   Hemoccult due: Not Indicated    Colonoscopy: polyp, diverticulosis  (04/18/2007)   Colonoscopy due: 04/17/2012  Other Screening   Pap smear: s/p TAH /BSO  (12/24/2007)   Pap smear due: Not Indicated    Mammogram: ASSESSMENT: Negative - BI-RADS 1^MM DIGITAL SCREENING  (07/26/2009)   Mammogram due: 05/13/2009    DXA bone density scan:  abnormal - osteopenic with mod frx risk [facility]  (05/13/2008)   DXA scan due: None    Smoking status: never  (10/08/2009)  Lipids   Total Cholesterol: 143  (06/23/2009)   LDL: 71  (06/23/2009)   LDL Direct: Not documented   HDL: 51  (06/23/2009)   Triglycerides: 106  (06/23/2009)    SGOT (AST): 13  (06/23/2009)   SGPT (ALT): 8  (06/23/2009)   Alkaline phosphatase: 65  (06/23/2009)   Total bilirubin: 0.3  (06/23/2009)  Hypertension   Last Blood Pressure: 135 / 76  (10/08/2009)   Serum creatinine: 0.80  (06/23/2009)   Serum potassium 4.7  (06/23/2009)  Self-Management Support :    Hypertension self-management support: Not documented    Lipid self-management support: Not documented

## 2010-05-19 NOTE — Progress Notes (Signed)
Summary: follow-up pain  Medications Added GABAPENTIN 100 MG CAPS (GABAPENTIN) take 1-3 tablets before bed at night       Phone Note Outgoing Call      New/Updated Medications: GABAPENTIN 100 MG CAPS (GABAPENTIN) take 1-3 tablets before bed at night Prescriptions: VICODIN 5-500 MG TABS (HYDROCODONE-ACETAMINOPHEN) one tablet every 6 hours as needed for pain  #30 x 0   Entered and Authorized by:   Delbert Harness MD   Signed by:   Delbert Harness MD on 03/31/2010   Method used:   Printed then faxed to ...       [facility]  [place] [address] (retail)       [address]       [place], [place]  [address]       Ph: [phone] or [phone]       Fax: [phone]   RxID:   [id number] GABAPENTIN 100 MG CAPS (GABAPENTIN) take 1-3 tablets before bed at night  #90 x 1   Entered and Authorized by:   Delbert Harness MD   Signed by:   Delbert Harness MD on 03/31/2010   Method used:   [facility]        [facility]  [place] [address] (retail)       [address]       [place], [place]  [address]       Ph: [phone] or [phone]       Fax: [phone]   RxID:   [id number]     Impression & Recommendations:  Problem # 1:  LEG PAIN, LEFT (ICD-729.5) Continued leg pain, better some morning but overall no improvement.  Continues to take vicodin once at night and once during the day.  Hurts even when in bed but worse also with movement.    Not only pain in anterior shins as before, but also having som epain in ankle and in thighs.  Discussed maybe due to chronic hip pain and be from back.  Will start gabapentin- will start very low as patient states she is very sensetive to meds.  Discussed how we will slowly titrate dose upwards.  Will plan to follow-up in office in January to re-examine and consider need for further back or hip studies.  Will refill vicodin  Complete Medication List: 1)  Atenolol 25 Mg Tabs (Atenolol) .... 1/2 pill am and 1/2 pill pm 2)  Omeprazole 10 Mg Cpdr (Omeprazole) .... Take  1 capsule by mouth once a day 3)  Synthroid 50 Mcg Tabs (Levothyroxine sodium) .... Take 1 tablet by mouth once a day 4)  Fexofenadine Hcl 180 Mg Tabs (Fexofenadine hcl) .... One by mouth daily 5)  Simvastatin 20 Mg Tabs (Simvastatin) .... One tablet qhs 6)  Zostavax 46270 Unt/0.34ml Solr (Zoster vaccine live) .... Handwritten prescription 7)  Lisinopril 10 Mg Tabs (Lisinopril) .... Take one tablet by mouth daily 8)  Flonase 50 Mcg/act Susp (Fluticasone propionate) .... 2 squirts in each nostril daily 9)  Amoxicillin 875 Mg Tabs (Amoxicillin) .... One tab two times a day for 7 days 10)  Vicodin 5-500 Mg Tabs (Hydrocodone-acetaminophen) .... One tablet every 6 hours as needed for pain 11)  Gabapentin 100 Mg Caps (Gabapentin) .... Take 1-3 tablets before bed at night

## 2010-05-19 NOTE — Op Note (Signed)
Summary: CONSENT  CONSENT   Imported By: Marily Memos 05/03/2010 10:01:37  _____________________________________________________________________  External Attachment:    Type:   Image     Comment:   External Document

## 2010-05-19 NOTE — Assessment & Plan Note (Signed)
Summary: f/u cough, leg painl/rh   Vital Signs:  Patient profile:   77 year old female Height:      61 inches Weight:      152.6 pounds BMI:     28.94 O2 Sat:      96 % on Room air Temp:     97.8 degrees F oral Pulse rate:   55 / minute BP sitting:   154 / 80  (left arm) Cuff size:   regular  Vitals Entered By: Jimmy Footman, CMA (April 27, 2010 10:29 AM)  O2 Flow:  Room air CC: follow-up visit cough/congestion, not getting better Is Patient Diabetic? No Pain Assessment Patient in pain? no        Primary Care Provider:  Delbert Harness MD  CC:  follow-up visit cough/congestion and not getting better.  History of Present Illness: 77 yo here for follow-up of cough and leg pain  cough:  several weeks of cough.  was seen Dec 27th and dx with viral illness.  seemed to get better then worsened cough with yellow rhinorrhea, postnasal drainage.  No sore throat, ear pain.  has had some dyspnea at baseline.  left leg pain:  continued pain on anterior shin, now also in knee.  No swelling or knee effusion.  Worse with stairs, sitting, ebtter with standing.  Some stiffness but not associated with periods of inactivity.  Uses cane to ambulate.  Continues to use vicodin at night.  gabapentin did not help andonly sedated her.  Habits & Providers  Alcohol-Tobacco-Diet     Tobacco Status: quit  Current Medications (verified): 1)  Atenolol 25 Mg Tabs (Atenolol) .... 1/2 Pill Am and 1/2 Pill Pm 2)  Omeprazole 10 Mg Cpdr (Omeprazole) .... Take 1 Capsule By Mouth Once A Day 3)  Synthroid 50 Mcg Tabs (Levothyroxine Sodium) .... Take 1 Tablet By Mouth Once A Day 4)  Fexofenadine Hcl 180 Mg Tabs (Fexofenadine Hcl) .... One By Mouth Daily 5)  Simvastatin 20 Mg Tabs (Simvastatin) .... One Tablet Qhs 6)  Zostavax 16109 Unt/0.33ml Solr (Zoster Vaccine Live) .... Handwritten Prescription 7)  Lisinopril 10 Mg Tabs (Lisinopril) .... Take One Tablet By Mouth Daily 8)  Flonase 50 Mcg/act Susp  (Fluticasone Propionate) .... 2 Squirts in Each Nostril Daily 9)  Amoxicillin 875 Mg Tabs (Amoxicillin) .... One Tab Two Times A Day For 10 Days 10)  Vicodin 5-500 Mg Tabs (Hydrocodone-Acetaminophen) .... One Tablet Every 6 Hours As Needed For Pain 11)  Hydrocodone-Homatropine 5-1.5 Mg/54ml Syrp (Hydrocodone-Homatropine) .... 5 Ml Q 4-6 Hours.  Dispense 200 Ml  Allergies: 1)  ! Sulfa 2)  ! Doxycycline 3)  ! * Tessalon PMH-FH-SH reviewed for relevance  Review of Systems      See HPI  Physical Exam  General:  Well-developed,well-nourished,in no acute distress; alert,appropriate and cooperative throughout examination Nose:  Mild rhinorrhea Mouth:  Oral mucosa and oropharynx without lesions or exudates.  Mild erythema in posterior pharynx. Teeth in good repair. Neck:  No deformities, masses, or tenderness noted. Lungs:  Normal respiratory effort, chest expands symmetrically. Lungs are clear to auscultation, no crackles or wheezes. Heart:  Normal rate and regular rhythm. S1 and S2 normal without gallop, murmur, click, rub or other extra sounds. Extremities:  no LE edema or knee effusion.  Tender to palpation alogn entire shin.  No TTP along patella or joint line.  no crepitus.  no joint laxity.   Impression & Recommendations:  Problem # 1:  URI (ICD-465.9)  given prolonged  course with second sickening, will treat with course of amoxicillin.  WIll also give cough syrup.  The following medications were removed from the medication list:    Tessalon Perles 100 Mg Caps (Benzonatate) ..... One tab three times a day as needed for cough Her updated medication list for this problem includes:    Fexofenadine Hcl 180 Mg Tabs (Fexofenadine hcl) ..... One by mouth daily    Hydrocodone-homatropine 5-1.5 Mg/14ml Syrp (Hydrocodone-homatropine) .Marland KitchenMarland KitchenMarland KitchenMarland Kitchen 5 ml q 4-6 hours.  dispense 200 ml  Orders: [facility]- Est  Level 4 ([address])  Problem # 2:  LEG PAIN, LEFT (ICD-729.5)  Unclear etiology.  Clinical history  may be primarily shin pain with subsequent knee pain from antalgic gait.  XR of knee and leg shows no significant pathology.  Will refer to sports med to evaluate for other possibly causes- ? stress fracture.  Precepted with Dr. [place].  Orders: [facility]- Est  Level 4 ([address]454)  Problem # 3:  HYPERTENSION, BENIGN ESSENTIAL (ICD-401.1)  blood pressure is elevated.  Prescribed by patient's cardiologist.  She states she was unsure about taking lisinopril.  Bp high today. Advised it would be safe and to encouraged trial as directed by cardiology.  Her updated medication list for this problem includes:    Atenolol 25 Mg Tabs (Atenolol) .Marland Kitchen... 1/2 pill am and 1/2 pill pm    Lisinopril 10 Mg Tabs (Lisinopril) .Marland Kitchen... Take one tablet by mouth daily  Orders: [facility]- Est  Level 4 ([address])  Complete Medication List: 1)  Atenolol 25 Mg Tabs (Atenolol) .... 1/2 pill am and 1/2 pill pm 2)  Omeprazole 10 Mg Cpdr (Omeprazole) .... Take 1 capsule by mouth once a day 3)  Synthroid 50 Mcg Tabs (Levothyroxine sodium) .... Take 1 tablet by mouth once a day 4)  Fexofenadine Hcl 180 Mg Tabs (Fexofenadine hcl) .... One by mouth daily 5)  Simvastatin 20 Mg Tabs (Simvastatin) .... One tablet qhs 6)  Zostavax 91478 Unt/0.68ml Solr (Zoster vaccine live) .... Handwritten prescription 7)  Lisinopril 10 Mg Tabs (Lisinopril) .... Take one tablet by mouth daily 8)  Flonase 50 Mcg/act Susp (Fluticasone propionate) .... 2 squirts in each nostril daily 9)  Amoxicillin 875 Mg Tabs (Amoxicillin) .... One tab two times a day for 10 days 10)  Vicodin 5-500 Mg Tabs (Hydrocodone-acetaminophen) .... One tablet every 6 hours as needed for pain 11)  Hydrocodone-homatropine 5-1.5 Mg/26ml Syrp (Hydrocodone-homatropine) .... 5 ml q 4-6 hours.  dispense 200 ml  Patient Instructions: 1)  Make appointment for sports medicine at front desk Prescriptions: HYDROCODONE-HOMATROPINE 5-1.5 MG/5ML SYRP (HYDROCODONE-HOMATROPINE) 5 ml q 4-6 hours.  Dispense  200 ml  #1 x 0   Entered and Authorized by:   Delbert Harness MD   Signed by:   Delbert Harness MD on 04/28/2010   Method used:   Historical   RxID:   [id number] AMOXICILLIN 875 MG TABS (AMOXICILLIN) one tab two times a day for 10 days  #20 x 0   Entered and Authorized by:   Delbert Harness MD   Signed by:   Delbert Harness MD on 04/27/2010   Method used:   [facility]        [facility]  [place] [address] [facility]       [address]       [place], [place]  [address]       Ph: [phone] or [phone]       Fax: [phone]   RxID:   [id number]  Orders Added: 1)  [facility]- Est  Level 4 [[address]]

## 2010-05-19 NOTE — Assessment & Plan Note (Signed)
Summary: LEG AND KNEE PAIN/KH   Vital Signs:  Patient profile:   77 year old female BP sitting:   154 / 79  Vitals Entered By: Rochele Pages RN (May 02, 2010 2:07 PM)  Primary Care Provider:  Delbert Harness MD   History of Present Illness: LEFT leg pain since November. No specific injury. Has had a lot of problems w B knee pain wfor 10 y or so and thinks this might be from that--but it is not localized to just her knee joint. radiates into calf and up into thigh. was seen at ED and had doppplers 9neg) and knee x ray.   Pain is 8/10 at worst---wprse with walking or stairs but continues to ache at all times. PERTINENT PMH/PSH: has never had knee surgery  Current Medications (verified): 1)  Atenolol 25 Mg Tabs (Atenolol) .... 1/2 Pill Am and 1/2 Pill Pm 2)  Omeprazole 10 Mg Cpdr (Omeprazole) .... Take 1 Capsule By Mouth Once A Day 3)  Synthroid 50 Mcg Tabs (Levothyroxine Sodium) .... Take 1 Tablet By Mouth Once A Day 4)  Fexofenadine Hcl 180 Mg Tabs (Fexofenadine Hcl) .... One By Mouth Daily 5)  Simvastatin 20 Mg Tabs (Simvastatin) .... One Tablet Qhs 6)  Zostavax 16109 Unt/0.81ml Solr (Zoster Vaccine Live) .... Handwritten Prescription 7)  Lisinopril 10 Mg Tabs (Lisinopril) .... Take One Tablet By Mouth Daily 8)  Flonase 50 Mcg/act Susp (Fluticasone Propionate) .... 2 Squirts in Each Nostril Daily 9)  Amoxicillin 875 Mg Tabs (Amoxicillin) .... One Tab Two Times A Day For 10 Days 10)  Vicodin 5-500 Mg Tabs (Hydrocodone-Acetaminophen) .... One Tablet Every 6 Hours As Needed For Pain 11)  Hydrocodone-Homatropine 5-1.5 Mg/9ml Syrp (Hydrocodone-Homatropine) .... 5 Ml Q 4-6 Hours.  Dispense 200 Ml  Allergies: 1)  ! Sulfa 2)  ! Doxycycline 3)  ! * Tessalon  Social History: Reviewed history from 10/26/2009 and no changes required. tob quit 1982, 25 pack yr hx, no drugs/etoh; married X 33 years, 3 grown children NSVD; 1 great grandichild.  husband smokes, outdoor Medical laboratory scientific officer; retired from [employer] ge[employer];  lives in trailer in [place] with husband.   eats lots of veggies.    Review of Systems  The patient denies anorexia and fever.    Physical Exam  General:  alert, well-developed, well-nourished, and well-hydrated.   walks with cane held in right hand Msk:  B knees some external changes of OA. medial joint line tender on left. Full ext and flexion B. calves are soft B and dp 2+ symmetrical Additional Exam:  reviewed plain non standing films of knee and tib fib done at ed--neg   Impression & Recommendations:  Problem # 1:  KNEE PAIN (ICD-719.46) I think this is all from DJD. Her films done prev were not standing films--we will get those. discussed options and we decided on injection which may be diagnostic as well as tehrapeutic rtc 1-2 w Orders: Diagnostic X-Ray/Fluoroscopy (Diagnostic X-Ray/Flu) Joint Aspirate / Injection, Large (20610) Kenalog 10 mg inj (J3301)  Problem # 2:  LEG PAIN, LEFT (ICD-729.5)  Complete Medication List: 1)  Atenolol 25 Mg Tabs (Atenolol) .... 1/2 pill am and 1/2 pill pm 2)  Omeprazole 10 Mg Cpdr (Omeprazole) .... Take 1 capsule by mouth once a day 3)  Synthroid 50 Mcg Tabs (Levothyroxine sodium) .... Take 1 tablet by mouth once a day 4)  Fexofenadine Hcl 180 Mg Tabs (Fexofenadine hcl) .... One by mouth daily 5)  Simvastatin 20 Mg Tabs (Simvastatin) .... One  tablet qhs 6)  Zostavax 91478 Unt/0.62ml Solr (Zoster vaccine live) .... Handwritten prescription 7)  Lisinopril 10 Mg Tabs (Lisinopril) .... Take one tablet by mouth daily 8)  Flonase 50 Mcg/act Susp (Fluticasone propionate) .... 2 squirts in each nostril daily 9)  Amoxicillin 875 Mg Tabs (Amoxicillin) .... One tab two times a day for 10 days 10)  Vicodin 5-500 Mg Tabs (Hydrocodone-acetaminophen) .... One tablet every 6 hours as needed for pain 11)  Hydrocodone-homatropine 5-1.5 Mg/20ml Syrp (Hydrocodone-homatropine) .... 5 ml q 4-6 hours.  dispense 200 ml   Orders Added: 1)  Diagnostic  X-Ray/Fluoroscopy [Diagnostic X-Ray/Flu] 2)  Est. Patient Level III [29562] 3)  Joint Aspirate / Injection, Large [20610] 4)  Kenalog 10 mg inj [J3301]  Appended Document: LEG AND KNEE PAIN/KH     Clinical Lists Changes  Observations: Added new observation of PEADULT: Denny Levy MD ~Additional Exam`PE comments (05/02/2010 14:58) Added new observation of PE COMMENTS: Patient given informed consent for injection. Discussed possible complications of infection, bleeding or skin atrophy at site of injection. Possible side effect of avascular necrosis (focal area of bone death) due to steroid use.Appropriate verbal time out taken Are cleaned and prepped in usual sterile fashion. A --1-- cc kennalog plus --4--cc 1% lidocaine without epinephrine was injected into theleft knee---. Patient tolerated procedure well with no complications.  (05/02/2010 14:58)       Complete Medication List: 1)  Atenolol 25 Mg Tabs (Atenolol) .... 1/2 pill am and 1/2 pill pm 2)  Omeprazole 10 Mg Cpdr (Omeprazole) .... Take 1 capsule by mouth once a day 3)  Synthroid 50 Mcg Tabs (Levothyroxine sodium) .... Take 1 tablet by mouth once a day 4)  Fexofenadine Hcl 180 Mg Tabs (Fexofenadine hcl) .... One by mouth daily 5)  Simvastatin 20 Mg Tabs (Simvastatin) .... One tablet qhs 6)  Zostavax 13086 Unt/0.21ml Solr (Zoster vaccine live) .... Handwritten prescription 7)  Lisinopril 10 Mg Tabs (Lisinopril) .... Take one tablet by mouth daily 8)  Flonase 50 Mcg/act Susp (Fluticasone propionate) .... 2 squirts in each nostril daily 9)  Amoxicillin 875 Mg Tabs (Amoxicillin) .... One tab two times a day for 10 days 10)  Vicodin 5-500 Mg Tabs (Hydrocodone-acetaminophen) .... One tablet every 6 hours as needed for pain 11)  Hydrocodone-homatropine 5-1.5 Mg/35ml Syrp (Hydrocodone-homatropine) .... 5 ml q 4-6 hours.  dispense 200 ml   Physical Exam  Additional Exam:  Patient given informed consent for injection. Discussed  possible complications of infection, bleeding or skin atrophy at site of injection. Possible side effect of avascular necrosis (focal area of bone death) due to steroid use.Appropriate verbal time out taken Are cleaned and prepped in usual sterile fashion. A --1-- cc kennalog plus --4--cc 1% lidocaine without epinephrine was injected into theleft knee---. Patient tolerated procedure well with no complications.

## 2010-05-19 NOTE — Progress Notes (Signed)
Summary: alternate rx  Medications Added TUSSIONEX PENNKINETIC ER 10-8 MG/5ML LQCR (HYDROCOD POLST-CHLORPHEN POLST) 5 ml twice a day as needed for cough.  Dispense 100 ml       Phone Note Call from Patient Call back at Home Phone [phone]   Reason for Call: Talk to Nurse Summary of Call: rx for cough makes pt vomitt, wants alternate rx Initial call taken by: Knox Royalty,  April 22, 2010 12:11 PM  Follow-up for Phone Call        fwd to pcp Follow-up by: Jimmy Footman, CMA,  April 25, 2010 9:27 AM    New/Updated Medications: Sandria Senter ER 10-8 MG/5ML LQCR (HYDROCOD POLST-CHLORPHEN POLST) 5 ml twice a day as needed for cough.  Dispense 100 ml Prescriptions: TUSSIONEX PENNKINETIC ER 10-8 MG/5ML LQCR (HYDROCOD POLST-CHLORPHEN POLST) 5 ml twice a day as needed for cough.  Dispense 100 ml  #1 x 0   Entered and Authorized by:   Delbert Harness MD   Signed by:   Delbert Harness MD on 04/25/2010   Method used:   Telephoned to ...       [facility]  [place] [address] ([facility]ail)       [address]       [place], [place]  [address]       Ph: [phone] or [phone]       Fax: [phone]   RxID:   [id number]

## 2010-05-19 NOTE — Progress Notes (Signed)
Summary: Triage  Medications Added TESSALON PERLES 100 MG CAPS (BENZONATATE) one tab three times a day as needed for cough       Phone Note Call from Patient Call back at Home Phone [phone]   Reason for Call: Talk to Nurse Summary of Call: pt calling re: continued cough, see last triage message. pt not any better & wants [place] to call something in, lives in [facility] & feels too bad to come out here again. Initial call taken by: Knox Royalty,  April 19, 2010 11:40 AM  Follow-up for Phone Call        Pt's cough has worsened - says it is wearing her out.  It is still productive, color greenish. Explained to pt that we do not like to call anything in w/o seeing the pt first.  Offered her a 1:45 with Dr. Alvester Morin but she states she cannot come in due to several afternoon conflicts.  Told her the best I could do at this point was route this note to her PCP to see if she had any addition advise.  Pt. agreeable. Follow-up by: Dennison Nancy RN,  April 19, 2010 12:13 PM  Additional Follow-up for Phone Call Additional follow up Details #1::        I sent in a script for Tessalon perles for symptomatic cough suppression as expected viral cough may linger.  Advised her if feels worse or fever, worsening dyspnea, needs to come in to evalute for worsening infection. Additional Follow-up by: Delbert Harness MD,  April 19, 2010 1:45 PM    Additional Follow-up for Phone Call Additional follow up Details #2::    Patient notified. Follow-up by: Dennison Nancy RN,  April 19, 2010 1:53 PM  New/Updated Medications: TESSALON PERLES 100 MG CAPS (BENZONATATE) one tab three times a day as needed for cough Prescriptions: TESSALON PERLES 100 MG CAPS (BENZONATATE) one tab three times a day as needed for cough  #15 x 0   Entered and Authorized by:   Delbert Harness MD   Signed by:   Dennison Nancy RN on 04/19/2010   Method used:   [facility]  [place] [address] [facility]ail[facility]       [address]       [place], [place]  [address]       Ph: [phone] or [phone]       Fax: [phone]   RxID:   [id number]

## 2010-05-19 NOTE — Assessment & Plan Note (Signed)
Summary: sorethroat and congested/bmc   Vital Signs:  Patient profile:   77 year old female Weight:      153 pounds O2 Sat:      94 % on Room air Temp:     98 degrees F oral Pulse rate:   60 / minute Pulse rhythm:   regular BP sitting:   133 / 77  (left arm) Cuff size:   regular  Vitals Entered By: Loralee Pacas CMA (April 12, 2010 3:27 PM) CC: sore throat and cough   Primary Provider:  Delbert Harness MD  CC:  sore throat and cough.  History of Present Illness: Cough: one week of cough with some mild production of clear-yellow sputum. Has scratchy throat, rhinorrhea, fatigue, intermittent HA. At times feels SOB, but no more than recent baseline (work up for subjective dyspnea in July has revealed no convincing cause, no hypoxia, normal PFTs).  Has tried Allegra, motrin, cough drops with minimal improvement. Denies fever, chills, chest pain, hemoptysis, wheezing, sick contacts. Had flu shot this winter. On ROS, complaining of leg pain still present since last OV w/ Dr. Earnest Bailey.   Allergies: 1)  ! Sulfa 2)  ! 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 Vitreous detachment 1996 Multiple nevi--last derm evaluation 01/2003 postmenopausal s/p hyst for h/o Cervical CA '82 (both ovaries taken at that time) now on hormonal replacement   Past Surgical History: Last updated: 10/28/2009 B hand films osteoarth, no erosions, no RA - 05/26/2005 BMI:27 (144 pounds, 61.5 inches) - 03/13/2003 Cardiolite:  EF 69%, no ischemia - 07/17/1999,  ECHO 08/2004 Normal LV fxn, trace MR and TR DEXA:  spine T  -2.0, L hip  -1.4 - 04/26/2003, L foot film wnl - 05/26/2005, R heel scan:  T score -0.93 - 01/16/1999 R shoulder:  degen change of AC joint - 05/18/2002 TAH -cervical CA, normal paps since - 04/17/1980  Family History: Last updated: 12/24/2007 Breast CA mother in her 32s brother deceased lung CA 44 CHF in father no CAD,DM, HTN  Social History: Last updated: 10/26/2009 tob quit 1982, 25 pack yr hx, no drugs/etoh; married X 33 years, 3 grown children NSVD; 1 great grandichild.  husband smokes, outdoor Medical laboratory scientific officer; retired from [employer] ge[employer]; lives in trailer in [place] with husband.   eats lots of veggies.    Risk Factors: Smoking Status: quit (02/08/2010)  Review of Systems  The patient denies anorexia, weight loss, decreased hearing, hoarseness, chest pain, syncope, peripheral edema, hemoptysis, abdominal pain, and muscle weakness.    Physical Exam  General:  Well-developed,well-nourished,in no acute distress; alert,appropriate and cooperative throughout examination Head:  Normocephalic and atraumatic without obvious abnormalities. No apparent alopecia or balding. Eyes:  EOMI. Perrla. Some conjunctival tearing and mild irritation. Vision grossly normal. Ears:  Bilateral serous fluid levels posterior to TMs. Nose:  Mild rhinorrhea Mouth:  Oral mucosa and oropharynx without lesions or exudates.  Mild erythema in posterior pharynx. Teeth  in good repair. Lungs:  Normal respiratory effort, chest expands symmetrically. Lungs are clear to  auscultation, no crackles or wheezes. Heart:  Normal rate and regular rhythm. S1 and S2 normal without gallop, murmur, click, rub or other extra sounds. Extremities:  No clubbing, cyanosis, edema or deformity noted with normal full range of motion of all joints. The patient points out her right  knee has some nontender soft tissue swelling in the posterior fossa.  Neurologic:  No cranial nerve deficits noted. Station and gait are normal. Sensory, motor and coordinative functions appear intact.   Impression & Recommendations:  Problem # 1:  UPPER RESPIRATORY INFECTION, VIRAL (ICD-465.9) One week of cough with viral syndrome,  no red flag symptoms, no hypoxia.  Reassured patient of the viral nature of her illness. Symptomatic care.  May continue allegra, motrin and recommended she try guafenisin for mucolytic properties if desired. Drink plenty of fluids. Return to care for fevers, worsened dyspnea, hemoptysis, CP, or if symptoms do not improve or worsen in the next week. Otherwise, f/u with Dr. Earnest Bailey as regularly scheduled.   Her updated medication list for this problem includes:    Fexofenadine Hcl 180 Mg Tabs (Fexofenadine hcl) ..... One by mouth daily  Orders: [facility]- Est Level  3 ([address])  Complete Medication List: 1)  Atenolol 25 Mg Tabs (Atenolol) .... 1/2 pill am and 1/2 pill pm 2)  Omeprazole 10 Mg Cpdr (Omeprazole) .... Take 1 capsule by mouth once a day 3)  Synthroid 50 Mcg Tabs (Levothyroxine sodium) .... Take 1 tablet by mouth once a day 4)  Fexofenadine Hcl 180 Mg Tabs (Fexofenadine hcl) .... One by mouth daily 5)  Simvastatin 20 Mg Tabs (Simvastatin) .... One tablet qhs 6)  Zostavax 60454 Unt/0.27ml Solr (Zoster vaccine live) .... Handwritten prescription 7)  Lisinopril 10 Mg Tabs (Lisinopril) .... Take one tablet by mouth daily 8)  Flonase 50 Mcg/act Susp (Fluticasone propionate) .... 2 squirts in each nostril daily 9)  Amoxicillin 875 Mg Tabs (Amoxicillin) .... One tab two  times a day for 7 days 10)  Vicodin 5-500 Mg Tabs (Hydrocodone-acetaminophen) .... One tablet every 6 hours as needed for pain 11)  Gabapentin 100 Mg Caps (Gabapentin) .... Take 1-3 tablets before bed at night  Patient Instructions: 1)  Nice to meet you. 2)  Your symptoms sound like a virus. This will take several days to improve.  3)  You may take mucinex and tessalon for your cough. 4)  Drink plenty of fluids. 5)  Return to clinic if you develop fevers, blood in sputum, feel short of breath, have nausea/vomitting, or chest pain.   Orders Added: 1)  [facility]- Est Level  3 [[address]]

## 2010-05-19 NOTE — Progress Notes (Signed)
   Phone Note Outgoing Call   Call placed by: Jimmy Footman, CMA,  March 31, 2010 10:32 AM Call placed to: Patient Summary of Call: Called pt to inform that the two rx's were sent to pharmacy. The vicodin was verbally called in by me

## 2010-05-19 NOTE — Progress Notes (Signed)
   Phone Note Call from Patient   Caller: Patient Call For: [phone] Summary of Call: Call about results of tests Initial call taken by: Abundio Miu,  March 25, 2010 2:30 PM  Follow-up for Phone Call        discussed normal xr and esr.  still having anterior shin "bone pain"  same or maybe slightly improved. no worse.  will touch bases later on this week and decide on further testing. Follow-up by: Delbert Harness MD,  March 28, 2010 8:54 AM

## 2010-05-19 NOTE — Progress Notes (Signed)
Summary: Triage   Phone Note Call from Patient Call back at Home Phone [phone]   Summary of Call: pt now has larengytis, pt wants to know what she can do for it? Initial call taken by: Knox Royalty,  April 15, 2010 2:54 PM  Follow-up for Phone Call        For the laryngitis I recommended warm salt water gargle, motrin for the inflammation and voice rest.  She also has a cough wtih color (yellow).  Is not running a fever, just feels tired and congested.  Re-read Dr. Sherran Needs instructions to her from the 12/27 visit and told her to continue to follow those.  Advised if her cough worsend to the point of chest pain, SOB or hemoptysis to call us next Tuesday so she could be seen.  Pt agreeable. Follow-up by: Dennison Nancy RN,  April 15, 2010 3:14 PM

## 2010-05-25 NOTE — Assessment & Plan Note (Signed)
Summary: POST XRAY OF KNEE,MC   Vital Signs:  Patient profile:   77 year old female BP sitting:   157 / 85  Vitals Entered By: Lillia Pauls CMA (May 16, 2010 3:54 PM)  Primary Care Provider:  Delbert Harness MD   History of Present Illness: f/u left knee pain--injection really helped for about 7-10 days--then she helped her son move and did a lot of carrying boxes up and down 3 steps. Started having same pain again  had her x rays done  Current Medications (verified): 1)  Atenolol 25 Mg Tabs (Atenolol) .... 1/2 Pill Am and 1/2 Pill Pm 2)  Omeprazole 10 Mg Cpdr (Omeprazole) .... Take 1 Capsule By Mouth Once A Day 3)  Synthroid 50 Mcg Tabs (Levothyroxine Sodium) .... Take 1 Tablet By Mouth Once A Day 4)  Fexofenadine Hcl 180 Mg Tabs (Fexofenadine Hcl) .... One By Mouth Daily 5)  Simvastatin 20 Mg Tabs (Simvastatin) .... One Tablet Qhs 6)  Zostavax 11914 Unt/0.87ml Solr (Zoster Vaccine Live) .... Handwritten Prescription 7)  Lisinopril 10 Mg Tabs (Lisinopril) .... Take One Tablet By Mouth Daily 8)  Flonase 50 Mcg/act Susp (Fluticasone Propionate) .... 2 Squirts in Each Nostril Daily 9)  Amoxicillin 875 Mg Tabs (Amoxicillin) .... One Tab Two Times A Day For 10 Days 10)  Vicodin 5-500 Mg Tabs (Hydrocodone-Acetaminophen) .... One Tablet Every 6 Hours As Needed For Pain 11)  Hydrocodone-Homatropine 5-1.5 Mg/38ml Syrp (Hydrocodone-Homatropine) .... 5 Ml Q 4-6 Hours.  Dispense 200 Ml 12)  Diclofenac Potassium 50 Mg Tabs (Diclofenac Potassium) .Marland Kitchen.. 1 By Mouth Once Daily/ Two Times A Day For Knee Pain As Needed Take With Food  Allergies: 1)  ! Sulfa 2)  ! Doxycycline 3)  ! * Tessalon  Review of Systems  The patient denies fever.    Physical Exam  General:  alert, well-developed, well-nourished, and well-hydrated.   Msk:  L knee TTP medial and lateral joint line, no effusion. external changes of OA disttally neurovascuallryintact Additional Exam:  reviewed her standing B knee films.  Her joint space is surprisingly well preserved. She has irregularity on underside of each patella L>R so I suspect her pain is from the patellofemoral compartment.   Impression & Recommendations:  Problem # 1:  KNEE PAIN (ICD-719.46) discussed options which are few--she has had some "heart racing" with some of the NSA(DS inteh past---we will try diclofenac and use it as needed to see if it  helps. I  will have her rtc 2 m after initial injectyition for f/u and would consider a repeat then if she wants but in general will goq 3 m. She is in agreementw this paln. She will use diclofenac as needed only, does not even plan to use it daily and plans only one pill.  Complete Medication List: 1)  Atenolol 25 Mg Tabs (Atenolol) .... 1/2 pill am and 1/2 pill pm 2)  Omeprazole 10 Mg Cpdr (Omeprazole) .... Take 1 capsule by mouth once a day 3)  Synthroid 50 Mcg Tabs (Levothyroxine sodium) .... Take 1 tablet by mouth once a day 4)  Fexofenadine Hcl 180 Mg Tabs (Fexofenadine hcl) .... One by mouth daily 5)  Simvastatin 20 Mg Tabs (Simvastatin) .... One tablet qhs 6)  Zostavax 78295 Unt/0.110ml Solr (Zoster vaccine live) .... Handwritten prescription 7)  Lisinopril 10 Mg Tabs (Lisinopril) .... Take one tablet by mouth daily 8)  Flonase 50 Mcg/act Susp (Fluticasone propionate) .... 2 squirts in each nostril daily 9)  Amoxicillin 875 Mg  Tabs (Amoxicillin) .... One tab two times a day for 10 days 10)  Vicodin 5-500 Mg Tabs (Hydrocodone-acetaminophen) .... One tablet every 6 hours as needed for pain 11)  Hydrocodone-homatropine 5-1.5 Mg/74ml Syrp (Hydrocodone-homatropine) .... 5 ml q 4-6 hours.  dispense 200 ml 12)  Diclofenac Potassium 50 Mg Tabs (Diclofenac potassium) .Marland Kitchen.. 1 by mouth once daily/ two times a day for knee pain as needed take with food Prescriptions: DICLOFENAC POTASSIUM 50 MG TABS (DICLOFENAC POTASSIUM) 1 by mouth once daily/ two times a day for knee pain as needed take with food  #60 x 1    Entered and Authorized by:   Denny Levy MD   Signed by:   Denny Levy MD on 05/16/2010   Method used:   [facility]  [place] [address] [facility]       [address]       [place], [place]  [address]       Ph: [phone] or [phone]       Fax: [phone]   RxID:   [id number]    Orders Added: 1)  Est. Patient Level III [[address]]

## 2010-06-23 ENCOUNTER — Other Ambulatory Visit: Payer: Self-pay | Admitting: Family Medicine

## 2010-06-23 MED ORDER — HYDROCODONE-HOMATROPINE 5-1.5 MG/5ML PO SYRP: 5.0000 mL | ORAL_SOLUTION | Freq: Four times a day (QID) | ORAL | Status: DC | PRN

## 2010-06-23 NOTE — Telephone Encounter (Signed)
Saw patient in  Office at her husband's office visit.  Still having cough.  Agreed to refill cough medicine once and advised to make appointment to review chronic cough workup.

## 2010-06-28 LAB — CBC
HCT: 39.2 % (ref 36.0–46.0)
Hemoglobin: 13.4 g/dL (ref 12.0–15.0)
MCHC: 34.2 g/dL (ref 30.0–36.0)
WBC: 7.7 10*3/uL (ref 4.0–10.5)

## 2010-06-28 LAB — DIFFERENTIAL
Basophils Relative: 0 % (ref 0–1)
Eosinophils Absolute: 0.3 10*3/uL (ref 0.0–0.7)
Eosinophils Relative: 4 % (ref 0–5)
Lymphs Abs: 2.9 10*3/uL (ref 0.7–4.0)
Monocytes Relative: 12 % (ref 3–12)

## 2010-06-28 LAB — COMPREHENSIVE METABOLIC PANEL
ALT: 14 U/L (ref 0–35)
AST: 19 U/L (ref 0–37)
Alkaline Phosphatase: 67 U/L (ref 39–117)
CO2: 29 mEq/L (ref 19–32)
Calcium: 8.9 mg/dL (ref 8.4–10.5)
GFR calc Af Amer: >60 mL/min (ref >60)
GFR calc non Af Amer: >60 mL/min (ref >60)
Potassium: 4.4 mEq/L (ref 3.5–5.1)
Sodium: 138 mEq/L (ref 135–145)
Total Protein: 7.4 g/dL (ref 6.0–8.3)

## 2010-07-15 ENCOUNTER — Ambulatory Visit: Payer: Self-pay | Admitting: Family Medicine

## 2010-07-18 ENCOUNTER — Other Ambulatory Visit: Payer: Self-pay | Admitting: Family Medicine

## 2010-07-18 NOTE — Telephone Encounter (Signed)
Refill request

## 2010-07-19 ENCOUNTER — Encounter: Payer: Self-pay | Admitting: Home Health Services

## 2010-08-22 ENCOUNTER — Other Ambulatory Visit (INDEPENDENT_AMBULATORY_CARE_PROVIDER_SITE_OTHER): Payer: Medicare Other | Admitting: Family Medicine

## 2010-08-22 ENCOUNTER — Ambulatory Visit (INDEPENDENT_AMBULATORY_CARE_PROVIDER_SITE_OTHER): Payer: Medicare Other | Admitting: Family Medicine

## 2010-08-22 VITALS — BP 155/75

## 2010-08-22 DIAGNOSIS — M1990 Unspecified osteoarthritis, unspecified site: Secondary | ICD-10-CM

## 2010-08-23 NOTE — Progress Notes (Signed)
  Subjective:    [name], female    DOB: [birth year], 77 y.o.   MRN: [id number]  HPI  Left knee pain worsening over the last 4-6 weeks. Right after her injection she had about 90% improvement in her knee pain. This lasted until about 6 weeks ago when she started having pain with standing walking and climbing stairs. She is also occasionally having night pain but not every night and it does not awaken her from sleep. She's had no new falls, no twisting injuries.  Review of Systems    no unusual weight gain or loss, no fever. Has noted no redness or swelling of the left knee. Objective:   Physical Exam    left knee has some external changes of osteoarthritis. She is tender to palpation along the medial joint line. There is no effusion. She is ligamentously intact. She has mild crepitus under the patella. Her calf is soft. Distally she is neurovascularly intact.    Assessment & Plan:  INJECTION: Patient was given informed consent, signed copy in the chart. Appropriate time out was taken. Area prepped and draped in usual sterile fashion. 1 cc of kenalog plus  4 cc of lidocaine was injected into the left klnee using a(n) anterior approach. The patient tolerated the procedure well. There were no complications. Post procedure instructions were given.

## 2010-08-23 NOTE — Assessment & Plan Note (Signed)
Left knee pain primarily from osteoarthritis. Did well with her first injection several months ago and had great relief. It lasted about 3-4 months. Discussed injection therapy in general today. Signed consent form a scan should the chart. We'll inject her again today. She will followup when necessary she is aware that we will likely not do these injections more often than every 3-4 months.

## 2010-08-30 NOTE — Assessment & Plan Note (Signed)
[facility]                            CARDIOLOGY OFFICE NOTE   EVONA, WESTRA                      MRN:          [id number]  DATE:11/18/2007                            DOB:          Aug 30, 1933    Ms. Jarrells comes in today for further management of her history of SVT,  tachy palpitations, and hyperlipidemia.   She says that she is doing remarkably well and has no further problems  with lightheadedness since we changed her atenolol to 12.5 b.i.d.   She is on simvastatin 20 mg daily.  She has remarkably good lipids as in  January.  Her total cholesterol was 156, triglycerides 124, HDL 46.6,  and LDL 85.  LFTs were normal.   She has had no syncope, presyncope, tachy palpitations, or dyspnea on  exertion.   Her blood pressure today is 126/70, her pulse is 58 and regular, and  weight is 144.  HEENT is unchanged.  Carotids are full without bruits.  No JVD.  Thyroid is not enlarged.  Trachea is midline.  Lungs are clear.  Heart reveals a regular rate and rhythm.  No gallop.  Abdominal exam is  soft.  Good bowel sounds.  Extremities with no edema.  Pulses are  intact.  Neuro exam is intact.   Ms. Montone is doing well.  I have made no changes in her medical  program.  We will plan on seeing her back again in 6 months.  At that  time, she will need fasting lipids and LFTs.     Thomas C. Daleen Squibb, MD, [facility]  Electronically Signed    TCW/MedQ  DD: 11/18/2007  DT: 11/19/2007  Job #: [id number]   cc:   [facility]

## 2010-08-30 NOTE — Assessment & Plan Note (Signed)
[facility]                            CARDIOLOGY OFFICE NOTE   GLENDELL, SCHLOTTMAN                      MRN:          [id number]  DATE:03/07/2007                            DOB:          1933-07-01    Ms. Rosasco comes back today for further management of her tachycardia,  palpitations, and also wanted me to review her cholesterol.   We DC'd her Lanoxin and put her on atenolol 25 mg in the morning and 25  in the evening.  Her tachycardia has improved as has her shortness of  breath when she walks.   She has brought in her cholesterol today which was a total of 228, LDL  139.  She wants to know about statins.   Her blood pressure today is 127/66, pulse 53 and regular.  She is  usually bradycardic.  Rest of her exam is unchanged.   We spent about 20 minutes talking about coronary artery disease and  stroke and generalized vascular disease and the role of statins in  reducing acute events or progressive events.  After a long discussion  she decided she would like to go on low dose Simvastatin 20 mg a day.  Will follow up in about six weeks with blood work.  Our goal will be to  get her LDL below 100, total well below 200.   I will plan on seeing her shortly thereafter to discuss this and see how  she is doing.     Thomas C. Daleen Squibb, MD, [facility]  Electronically Signed    TCW/MedQ  DD: 03/07/2007  DT: 03/08/2007  Job #: [id number]

## 2010-08-30 NOTE — Assessment & Plan Note (Signed)
[facility]                            CARDIOLOGY OFFICE NOTE   MAYLEIGH, TETRAULT                      MRN:          [id number]  DATE:06/04/2007                            DOB:          [birth year]    Amy Rodriguez returns today further manage her tachycardia and  hyperlipidemia.   She has had a wonderful response to simvastatin 20 mg a day.  Her total  cholesterol is 156, triglycerides 124 and HDL 46.6, LDL 85.  LFTs were  normal.  She tolerated drug fine.   She has noticed her pulse rate in the 40s at time and she had been a  little lightheaded.  She has cut her atenolol back to 25 mg the morning  12.5 around noon.  She took both dose for she came today.   Her blood pressure today is 131/66, her pulse 49.  She is in sinus brady  by EKG.  Her weight is 146.  The rest her exam is unchanged.   I have had a nice chat with Amy Rodriguez today.  She will continue with  her simvastatin and we will check follow-up lipids and LFTs in 6 months.  In addition, I have asked her to take her atenolol a 12-hour intervals  as opposed to taking it so shortly after she takes it in the morning at  noon.  Hopefully this will help prevent some of the lightheadedness and  presyncope.  If it does not, told her to cut to 12.5 mg b.i.d.   I will plan on seeing her back again in several months.     Thomas C. Daleen Squibb, MD, [facility]  Electronically Signed    TCW/MedQ  DD: 06/04/2007  DT: 06/05/2007  Job #: [id number]

## 2010-08-30 NOTE — Assessment & Plan Note (Signed)
[facility]                            CARDIOLOGY OFFICE NOTE   [name], [name]                      MRN:          [id number]  DATE:02/21/2007                            DOB:          1933-11-12    [name] returns today for further management of the following issues:  1. SVT.  2. Asymptomatic bradycardia.  3. History of exertional dyspnea. I have recommended adenosine Myoview      which she cancelled.  4. History of mild pulmonary hypertension.  5. Labile hypertension.   She has been noticing a little bit of tachycardia at the top of the hill  with walking. She feels like it is a car revving up that just will not  go into gear. She would like to go up on her atenolol to 25 mg twice a  day.   She also takes Lanoxin 0.25 mg a day.   She has had an echocardiogram in the past which shows normal left  ventricular function.   CURRENT MEDICATIONS:  1. Atenolol 25 mg in the morning and 12.5 in the evening.  2. Premarin 0.3 mg a day.  3. Lanoxin 0.25 mg a day.  4. TUMS two b.i.d.  5. Levothyroxine 50 mcg a day.  6. Omeprazole 10 mg daily.   Her blood pressure is 126/63. Her pulse is 45 and sinus brady. Her EKG  shows marked ST segment changes inferior and laterally, which are old.  Weight is 143. She is in no acute distress.  HEENT: Unchanged. Carotid upstrokes are equal bilaterally without  bruits. There is no JVD.  Thyroid is not enlarged. Trachea is midline.  LUNGS:  Are clear.  HEART: Reveals a slow rate and rhythm. There is no gallop.  ABDOMEN: Soft with good bowel sounds. There is no midline bruit.  EXTREMITIES: Reveals no cyanosis, clubbing or edema. Pulses are intact.  NEURO: Is grossly intact.   I had a nice chat with Ms. Koone today. I have discontinued her Lanoxin  and have increased her atenolol to 25 b.i.d. Will see her back in a  couple of weeks to obtain an EKG and to check her heart rate. Hopefully,  this will help her  symptomatically.     Thomas C. Daleen Squibb, MD, [facility]  Electronically Signed    TCW/MedQ  DD: 02/21/2007  DT: 02/21/2007  Job #: [id number]   cc:   [facility]

## 2010-08-30 NOTE — Assessment & Plan Note (Signed)
[facility]                            CARDIOLOGY OFFICE NOTE   YONG, WAHLQUIST                      MRN:          [id number]  DATE:11/12/2006                            DOB:          04-05-1934    Ms. Bjelland returns today for further management of the following issues.  1. Supraventricular tachycardia.  She has had no tachycardic events.  2. History of exertional dyspnea.  I saw her last year and set her up      for an adenosine Myoview, which she cancelled.  She has had some      shoulder pain, but it is with motion, and probably related to her      working in the garden.  3. History of mild pulmonary hypertension.  4. Labile hypertension.   CURRENT MEDICATIONS:  1. Atenolol 25 mg in the morning 12.5 in the evening.  2. She is also on levothyroxine 50 mcg a day.  3. Omeprazole 10 mg a day.  4. Lanoxin 0.25 mg a day.  5. Premarin 0.3 mg a day.   EXAM:  Blood pressure 132/74, pulse 50 and regular.  Her weight is 143,  down 10.  HEENT:  Skin is pale and dry.  PERRLA.  Extraocular muscles are intact.  Sclerae clear.  Facial symmetry is normal.  Carotid upstrokes are equal bilaterally without bruits.  No JVD.  Thyroid is not enlarged.  Trachea is midline.  LUNGS:  Clear.  HEART:  Reveals a nondisplaced PMI.  She has a slow rate and rhythm.  Dynamic S1.  No murmur.  S2 is physiologically split.  ABDOMEN:  Soft with good bowel sounds.  EXTREMITIES:  With no edema.  Pulses are intact.  NEURO:  Intact.   EKG:  Shows nonspecific ST segment changes with sinus brady.  It has not  changed from before.   ASSESSMENT AND PLAN:  Ms. Dipalma is doing well.  I renewed her  medications, specifically atenolol and Lanoxin.  I will see her back in  a year.     Thomas C. Daleen Squibb, MD, [facility]  Electronically Signed    TCW/MedQ  DD: 11/12/2006  DT: 11/13/2006  Job #: [id number]   cc:   [facility]

## 2010-08-30 NOTE — Assessment & Plan Note (Signed)
[facility]                         GASTROENTEROLOGY OFFICE NOTE   TARRA, PENCE                      MRN:          [id number]  DATE:04/26/2007                            DOB:          04-04-1934    REASON FOR CONSULTATION:  Heme-positive stool.   Amy Rodriguez is a pleasant 77 year old white female referred through the  courtesy of Dr. Gavin Potters for evaluation.  Heme-positive stool was noted  on routine testing.  Mrs. Greis has no GI complaints, including change  in bowel habits, abdominal pain, melena, or hematochezia.  She is on no  gastric irritants, including nonsteroidals.  She takes omeprazole daily  for occasional pyrosis.  She has a remote history of ulcers.   PAST MEDICAL HISTORY:  Pertinent for hypertension.  She has had  arrhythmias.  She has a history of thyroid disease and arthritis.  She  is status post hysterectomy.   FAMILY HISTORY:  Noncontributory.   MEDICATIONS:  Atenolol.  Levothyroxine.  Omeprazole.  Premarin.  Simvastatin.   ALLERGIES:  SULFA (itching and rash).   She does not smoke or drink.  She is married and retired.   REVIEW OF SYSTEMS:  Positive for joint pains.  Occasional nosebleeds.   EXAM:  Pulse 58, blood pressure 138/76, weight 142.  HEENT:  EOMI. PERRLA. Sclerae are anicteric.  Conjunctivae are pink.  NECK:  Supple without thyromegaly, adenopathy or carotid bruits.  CHEST:  Clear to auscultation and percussion without adventitious  sounds.  CARDIAC:  Regular rhythm; normal S1 S2.  There are no murmurs, gallops  or rubs.  ABDOMEN:  Bowel sounds are normoactive.  Abdomen is soft, non-tender and  non-distended.  There are no abdominal masses, tenderness, splenic  enlargement or hepatomegaly.  EXTREMITIES:  Full range of motion.  No cyanosis, clubbing or edema.  RECTAL:  Deferred.   IMPRESSION:  Heme-positive stool.  Chronic bleeding sources including  polyps, arteriovenous malformation, neoplasm, and  hemorrhoids are  considerations.  An upper gastrointestinal source is less likely.   RECOMMENDATION:  Colonoscopy.  If negative, I will obtain followup  Hemoccult.  If still positive, I would proceed with upper endoscopy.     Barbette Hair. Arlyce Dice, MD,FACG  Electronically Signed    RDK/MedQ  DD: 04/26/2007  DT: 04/26/2007  Job #: [id number]   cc:   Rolm Gala, M.D.

## 2010-08-30 NOTE — Assessment & Plan Note (Signed)
[facility]                            CARDIOLOGY OFFICE NOTE   Amy Rodriguez, Amy Rodriguez                      MRN:          [id number]  DATE:05/21/2008                            DOB:          1934-04-03    Amy Rodriguez comes in today for further management of her SVT, tachy  palpitations, hyperlipidemia.   She has no complaints.  She has had no lightheadedness whatsoever.   She is sort of self-medicating herself with her atenolol.  She is  taking, as best as I can tell, 12.5 mg in the morning, 12.5 at noon and  then it sounds like a full tablet at night.  Sometimes, she takes 1-1/2  per day.   She is currently on:  1. Levothyroxine 50 mcg a day.  2. Omeprazole 20 mg a day.  3. Premarin 0.3 mg per day.  4. Simvastatin 20 mg a day.  5. Atenolol as I mentioned above.   Her exam today, she is in no acute distress.  Her blood pressure is  140/70, her pulse is 48.  EKG confirms sinus brady with nonspecific ST  segment changes.  Her weight is 146.  HEENT:  Normal.  Carotid upstrokes are equal bilaterally without bruits.  No JVD.  Thyroid is not enlarged.  Trachea is midline.  LUNGS:  Clear.  HEART:  Reveals a slow rate and rhythm.  No gallop, murmur, or rub.  ABDOMEN:  Soft.  Good bowel sounds.  No pulsatile mass.  EXTREMITIES:  No edema.  Pulses are intact.  NEURO:  Exam is intact.   Amy Rodriguez is a little bit slow even though she is asymptomatic.  I have  asked her to cut her atenolol to one 25 mg tablet per day.  She can take  an extra 12.5 if she is having a lot of palpitations on a particular  day.   I will plan on seeing her back in 6 months.   I am also going to check a TSH since she says she has not had any blood  work in a while.     Thomas C. Daleen Squibb, MD, [facility]  Electronically Signed    TCW/MedQ  DD: 05/21/2008  DT: 05/22/2008  Job #: [id number]

## 2010-08-30 NOTE — Letter (Signed)
April 26, 2007    Rolm Gala, M.D.  [address] [place] [address]   RE:  Amy Rodriguez, Amy Rodriguez  MRN:  [id number]  /  DOB:  04-Mar-1934   Dear Dr. Gavin Potters:   Upon your kind referral, I had the pleasure of evaluating your patient  and I am pleased to offer my findings.  I saw Amy Rodriguez in the  office today.  Enclosed is a copy of my progress note that details my  findings and recommendations.   Thank you for the opportunity to participate in your patient's care.    Sincerely,      Barbette Hair. Arlyce Dice, MD,FACG  Electronically Signed    RDK/MedQ  DD: 04/26/2007  DT: 04/26/2007  Job #: [id number]

## 2010-09-20 ENCOUNTER — Ambulatory Visit (INDEPENDENT_AMBULATORY_CARE_PROVIDER_SITE_OTHER): Payer: Medicare Other | Admitting: Family Medicine

## 2010-09-20 ENCOUNTER — Encounter: Payer: Self-pay | Admitting: Family Medicine

## 2010-09-20 ENCOUNTER — Ambulatory Visit (HOSPITAL_COMMUNITY)
Admission: RE | Admit: 2010-09-20 | Discharge: 2010-09-20 | Disposition: A | Discharge status: Home / Self Care | Payer: Medicare Other | Source: Ambulatory Visit | Attending: Family Medicine | Admitting: Family Medicine

## 2010-09-20 VITALS — BP 144/76 | HR 54 | Temp 97.1°F | Ht 62.0 in | Wt 150.0 lb

## 2010-09-20 DIAGNOSIS — R232 Flushing: Secondary | ICD-10-CM

## 2010-09-20 DIAGNOSIS — M899 Disorder of bone, unspecified: Secondary | ICD-10-CM

## 2010-09-20 DIAGNOSIS — E039 Hypothyroidism, unspecified: Secondary | ICD-10-CM

## 2010-09-20 DIAGNOSIS — E785 Hyperlipidemia, unspecified: Secondary | ICD-10-CM

## 2010-09-20 DIAGNOSIS — R05 Cough: Secondary | ICD-10-CM

## 2010-09-20 DIAGNOSIS — R059 Cough, unspecified: Secondary | ICD-10-CM

## 2010-09-20 DIAGNOSIS — R053 Chronic cough: Secondary | ICD-10-CM

## 2010-09-20 DIAGNOSIS — R0602 Shortness of breath: Secondary | ICD-10-CM

## 2010-09-20 DIAGNOSIS — I10 Essential (primary) hypertension: Secondary | ICD-10-CM | POA: Insufficient documentation

## 2010-09-20 DIAGNOSIS — M949 Disorder of cartilage, unspecified: Secondary | ICD-10-CM

## 2010-09-20 DIAGNOSIS — E559 Vitamin D deficiency, unspecified: Secondary | ICD-10-CM

## 2010-09-20 DIAGNOSIS — N951 Menopausal and female climacteric states: Secondary | ICD-10-CM

## 2010-09-20 DIAGNOSIS — K589 Irritable bowel syndrome without diarrhea: Secondary | ICD-10-CM

## 2010-09-20 DIAGNOSIS — I471 Supraventricular tachycardia, unspecified: Secondary | ICD-10-CM

## 2010-09-20 LAB — CBC WITH DIFFERENTIAL/PLATELET
Basophils Relative: 0 % (ref 0–1)
Eosinophils Absolute: 0.1 10*3/uL (ref 0.0–0.7)
MCH: 29.3 pg (ref 26.0–34.0)
MCHC: 33.4 g/dL (ref 30.0–36.0)
Monocytes Relative: 12 % (ref 3–12)
Neutrophils Relative %: 54 % (ref 43–77)
Platelets: 198 10*3/uL (ref 150–400)
RDW: 13.7 % (ref 11.5–15.5)

## 2010-09-20 MED ORDER — ZOSTER VACCINE LIVE 19400 UNT/0.65ML ~~LOC~~ SOLR: 1.0000 [IU] | Freq: Once | SUBCUTANEOUS | Status: DC

## 2010-09-20 MED ORDER — CHLORPHENIRAMINE-PSEUDOEPH 4-60 MG PO TABS: 1.0000 | ORAL_TABLET | Freq: Four times a day (QID) | ORAL | Status: DC

## 2010-09-20 NOTE — Assessment & Plan Note (Signed)
Will draw fasting labs today

## 2010-09-20 NOTE — Assessment & Plan Note (Signed)
Would advise non-hormonal tx.  Will start trial of gabapentin- she already has some at home and will taper up from 100 mg qhs to 300 mg qhs.

## 2010-09-20 NOTE — Assessment & Plan Note (Signed)
Will recheck today.

## 2010-09-20 NOTE — Assessment & Plan Note (Signed)
Last dexa over 2 years ago, will repeat to see if needs additoinal tx.  Currently on calcium/vit d

## 2010-09-20 NOTE — Progress Notes (Signed)
  Subjective:    [name], female    DOB: 14-Oct-1933, 77 y.o.   MRN: [id number]  HPIHere to discuss cough and hot flashes  Started 6 months ago- at the time had a cold - got better for a several week period in March, then came back - mostly dry cough, sometimes phlegm, feels a lot of sinus drainage - cough is worse at night, some dyspnea with exertion- worked up - due to deconditioning. Exertion does not make it worse - Has been taking taking prilosec and allegra regularly without improvement - has had a trial of a few weeks of lisinopril, without improvement in cough. - smoked for 20-25 years- 1 PPD.  Quit in 1982.  Hot flashes:  Restarted after stopped estrogen about 1 year ago.  Had been on estrogen since 1982 after hysterectomy and oophorectomy for cervical cancer.  Flushes day and night., lasting 10 minutes.  Has not tried any OTC medications or prescritpion medications   Review of Systems  Constitutional: Negative for fever and unexpected weight change.  HENT: Positive for postnasal drip. Negative for sore throat, trouble swallowing and neck pain.   Respiratory: Positive for cough and shortness of breath. Negative for chest tightness.   Cardiovascular: Negative for chest pain.  Gastrointestinal: Negative for nausea, vomiting, diarrhea, constipation and blood in stool.  Genitourinary: Negative for dysuria.       Objective:   Physical Exam  Constitutional: She appears well-developed and well-nourished. No distress.  Neck: Neck supple. No thyromegaly present.  Cardiovascular: Normal rate and regular rhythm.   No murmur heard. Pulmonary/Chest: Effort normal and breath sounds normal. No respiratory distress. She has no wheezes. She has no rales.  Abdominal: Soft. Bowel sounds are normal.  Musculoskeletal: She exhibits no edema.  Psychiatric: She has a normal mood and affect.          Assessment & Plan:

## 2010-09-20 NOTE — Assessment & Plan Note (Signed)
Has had trial of antihsitamine, PPI, off lisinopril, with no improvement.  Normal spirometry.  Will get CXR to rule out malignancy.  Likely postnasal drainage vs mild COPD given long smoking history and despite quitting 30 years ago, has significant secondhand smoke exposure.  Will use chlorpheniramine to help with PND, avoid decongestants given SVT and HTN.

## 2010-09-20 NOTE — Patient Instructions (Signed)
Try new allergy medicine- stop allegra for now Give me a call if after 2-3 weeks no improvement Follow-up in 2-3 months Will schedule dex scan

## 2010-09-21 ENCOUNTER — Encounter: Payer: Self-pay | Admitting: Family Medicine

## 2010-09-21 LAB — COMPREHENSIVE METABOLIC PANEL
CO2: 26 mEq/L (ref 19–32)
Creat: 0.9 mg/dL (ref 0.50–1.10)
Glucose, Bld: 85 mg/dL (ref 70–99)
Total Bilirubin: 0.4 mg/dL (ref 0.3–1.2)

## 2010-09-21 LAB — LIPID PANEL
Cholesterol: 152 mg/dL (ref 0–200)
HDL: 49 mg/dL (ref >39)
Total CHOL/HDL Ratio: 3.1 Ratio
Triglycerides: 102 mg/dL (ref <150)
VLDL: 20 mg/dL (ref 0–40)

## 2010-09-21 LAB — VITAMIN D 25 HYDROXY (VIT D DEFICIENCY, FRACTURES): Vit D, 25-Hydroxy: 35 ng/mL (ref 30–89)

## 2010-09-26 ENCOUNTER — Telehealth: Payer: Self-pay | Admitting: *Deleted

## 2010-09-26 NOTE — Telephone Encounter (Signed)
Message copied by Farrell Ours on Mon Sep 26, 2010  3:37 PM ------      Message from: Macy Mis      Created: Tue Sep 20, 2010 11:26 AM       See order for DEXA screening

## 2010-09-26 NOTE — Telephone Encounter (Signed)
Spoke with patient and informed her of DEXA scan appointment set up. [facility] Phone number is [phone]. [address]   Patient was given phone number to breast center if needing to cancel or change appointment

## 2010-09-28 ENCOUNTER — Other Ambulatory Visit: Payer: Self-pay | Admitting: Family Medicine

## 2010-09-28 ENCOUNTER — Other Ambulatory Visit: Payer: Self-pay | Admitting: Cardiology

## 2010-09-28 NOTE — Telephone Encounter (Signed)
Refill request

## 2010-09-30 ENCOUNTER — Other Ambulatory Visit: Payer: Self-pay | Admitting: Family Medicine

## 2010-09-30 DIAGNOSIS — M899 Disorder of bone, unspecified: Secondary | ICD-10-CM

## 2010-09-30 DIAGNOSIS — Z139 Encounter for screening, unspecified: Secondary | ICD-10-CM

## 2010-10-03 ENCOUNTER — Other Ambulatory Visit: Payer: Self-pay | Admitting: Family Medicine

## 2010-10-03 NOTE — Telephone Encounter (Signed)
Refill request

## 2010-10-04 ENCOUNTER — Other Ambulatory Visit: Payer: Self-pay | Admitting: Family Medicine

## 2010-10-04 ENCOUNTER — Other Ambulatory Visit: Payer: MEDICARE

## 2010-10-04 NOTE — Telephone Encounter (Signed)
Refill request

## 2010-10-06 ENCOUNTER — Ambulatory Visit (HOSPITAL_COMMUNITY)
Admission: RE | Admit: 2010-10-06 | Discharge: 2010-10-06 | Disposition: A | Discharge status: Home / Self Care | Payer: Medicare Other | Source: Ambulatory Visit | Attending: Family Medicine | Admitting: Family Medicine

## 2010-10-06 DIAGNOSIS — M949 Disorder of cartilage, unspecified: Secondary | ICD-10-CM | POA: Insufficient documentation

## 2010-10-06 DIAGNOSIS — M899 Disorder of bone, unspecified: Secondary | ICD-10-CM | POA: Insufficient documentation

## 2010-10-06 DIAGNOSIS — Z1231 Encounter for screening mammogram for malignant neoplasm of breast: Secondary | ICD-10-CM | POA: Insufficient documentation

## 2010-10-06 DIAGNOSIS — Z139 Encounter for screening, unspecified: Secondary | ICD-10-CM

## 2010-10-14 ENCOUNTER — Telehealth: Payer: Self-pay | Admitting: Family Medicine

## 2010-10-14 DIAGNOSIS — M899 Disorder of bone, unspecified: Secondary | ICD-10-CM

## 2010-10-14 NOTE — Telephone Encounter (Signed)
Left message.  Will send her a letter.  If she calls back, may read her the letter as I will be gone for two weeks.

## 2010-10-14 NOTE — Assessment & Plan Note (Signed)
May qualify for bisphosphonates give prob of hip fracture >3%.  Will discuss further with patient.

## 2010-10-14 NOTE — Telephone Encounter (Signed)
Message copied by Macy Mis on Fri Oct 14, 2010  9:01 AM ------      Message from: MCDIARMID, TODD D      Created: Thu Oct 06, 2010  3:30 PM                   ----- Message -----         From: Rad Results In Interface         Sent: 10/06/2010   2:08 PM           To: Leighton Roach McDiarmid

## 2010-11-18 ENCOUNTER — Other Ambulatory Visit: Payer: Self-pay | Admitting: Family Medicine

## 2010-11-21 NOTE — Telephone Encounter (Signed)
Refill request

## 2010-11-23 ENCOUNTER — Encounter: Payer: Self-pay | Admitting: Cardiology

## 2010-11-24 ENCOUNTER — Other Ambulatory Visit: Payer: Self-pay | Admitting: Family Medicine

## 2010-12-12 ENCOUNTER — Encounter: Payer: Self-pay | Admitting: Family Medicine

## 2010-12-12 ENCOUNTER — Other Ambulatory Visit: Payer: Self-pay | Admitting: Family Medicine

## 2010-12-12 ENCOUNTER — Ambulatory Visit (INDEPENDENT_AMBULATORY_CARE_PROVIDER_SITE_OTHER): Payer: Medicare Other | Admitting: Family Medicine

## 2010-12-12 VITALS — BP 158/81 | HR 60 | Ht 62.0 in | Wt 150.0 lb

## 2010-12-12 DIAGNOSIS — M25569 Pain in unspecified knee: Secondary | ICD-10-CM

## 2010-12-12 DIAGNOSIS — M25562 Pain in left knee: Secondary | ICD-10-CM

## 2010-12-12 MED ORDER — DICLOFENAC POTASSIUM 50 MG PO TABS: 50.0000 mg | ORAL_TABLET | Freq: Two times a day (BID) | ORAL | Status: DC

## 2010-12-12 NOTE — Progress Notes (Signed)
  Subjective:    [name], female    DOB: May 16, 1933, 77 y.o.   MRN: [id number]  HPI Left knee pain. She had an injection in April. It was her very first corticosteroid injection in her knee and it helped 50-75% up until July. She started having the same type of aching pain most of the time, worse with stair climbing and walking. She's had no swelling of the knee. It feels like it might catch but she's had no giving way or locking. She is interested in pursuing possible total knee replacement. He realizes she's fairly healthy man he does not certain that she will be in the same good health 5 or 10 years from now. Her husband just had to have hip replacement surgery and this has got her thinking about timing of joint replacement.  Review of Systems Pertinent review of systems: negative for fever or unusual weight change.     Objective:   Physical Exam   GENERAL: Well-developed female no acute distress LEFT KNEE: Full extension and full flexion. Mild crepitus. Ligamentously intact. Calf is soft. Distally she is neurovascularly intact. Review of her x-rays. These were not standing films. They show fairly well maintained joint space. I do not see a lot of osteophytes. I see no sclerosis and no cysts. All in all been a slight very mild degenerative change by x-ray. Standing x-rays might be more revealing.  INJECTION: Patient was given informed consent, signed copy in the chart. Appropriate time out was taken. Area prepped and draped in usual sterile fashion. One cc of kenalog plus  4 cc of lidocaine was injected into the left knee using a(n) anterior approach. The patient tolerated the procedure well. There were no complications. Post procedure instructions were given.      Assessment & Plan:  #1. Left knee pain. Symptoms are consistent with osteoarthritis. I discussed options with her. She would like to get more information about total knee replacement given her concerns as  outlined in the history of present illness. I'll set her up with the same group they did her husband hip surgery. I don't think she's close to needing a total knee replacement at this time but understand her desire to learn more about her options. In the interim I would be happy to inject her every 3 months when necessary.

## 2010-12-12 NOTE — Patient Instructions (Signed)
Valeta Harms [facility] Monday January 16, 2011 10:30AM ARRIVE AT 10:15AM [address] [phone]

## 2010-12-20 ENCOUNTER — Ambulatory Visit: Payer: Medicare Other | Admitting: Cardiology

## 2011-01-12 ENCOUNTER — Ambulatory Visit (INDEPENDENT_AMBULATORY_CARE_PROVIDER_SITE_OTHER): Payer: Medicare Other | Admitting: *Deleted

## 2011-01-12 VITALS — Temp 97.8°F

## 2011-01-12 DIAGNOSIS — Z23 Encounter for immunization: Secondary | ICD-10-CM

## 2011-04-28 ENCOUNTER — Other Ambulatory Visit: Payer: Self-pay | Admitting: Family Medicine

## 2011-04-28 NOTE — Telephone Encounter (Signed)
Refill request

## 2011-07-04 ENCOUNTER — Other Ambulatory Visit: Payer: Self-pay | Admitting: Family Medicine

## 2011-07-06 ENCOUNTER — Other Ambulatory Visit: Payer: Self-pay | Admitting: Family Medicine

## 2011-08-11 ENCOUNTER — Other Ambulatory Visit: Payer: Self-pay | Admitting: Cardiology

## 2011-08-11 NOTE — Telephone Encounter (Signed)
..   Requested Prescriptions   Pending Prescriptions Disp Refills  . atenolol (TENORMIN) 25 MG tablet [Pharmacy Med Name: ATENOLOL 25 MG TABLET] 30 tablet 3    Sig: TAKE 1 TABLET BY MOUTH EVERY DAY  ok per Eunice Blase

## 2011-10-01 ENCOUNTER — Other Ambulatory Visit: Payer: Self-pay | Admitting: Family Medicine

## 2011-10-09 ENCOUNTER — Other Ambulatory Visit: Payer: Self-pay | Admitting: Family Medicine

## 2011-11-02 ENCOUNTER — Encounter: Payer: Self-pay | Admitting: *Deleted

## 2011-11-11 ENCOUNTER — Other Ambulatory Visit: Payer: Self-pay | Admitting: Family Medicine

## 2011-11-24 ENCOUNTER — Ambulatory Visit (INDEPENDENT_AMBULATORY_CARE_PROVIDER_SITE_OTHER): Payer: Medicare Other | Admitting: Cardiology

## 2011-11-24 ENCOUNTER — Encounter: Payer: Self-pay | Admitting: Cardiology

## 2011-11-24 VITALS — BP 142/74 | HR 51 | Ht 62.0 in | Wt 148.0 lb

## 2011-11-24 DIAGNOSIS — R0609 Other forms of dyspnea: Secondary | ICD-10-CM

## 2011-11-24 DIAGNOSIS — R609 Edema, unspecified: Secondary | ICD-10-CM

## 2011-11-24 DIAGNOSIS — I471 Supraventricular tachycardia: Secondary | ICD-10-CM

## 2011-11-24 DIAGNOSIS — R0602 Shortness of breath: Secondary | ICD-10-CM

## 2011-11-24 DIAGNOSIS — R002 Palpitations: Secondary | ICD-10-CM

## 2011-11-24 DIAGNOSIS — I10 Essential (primary) hypertension: Secondary | ICD-10-CM

## 2011-11-24 DIAGNOSIS — R0989 Other specified symptoms and signs involving the circulatory and respiratory systems: Secondary | ICD-10-CM

## 2011-11-24 MED ORDER — ATENOLOL 25 MG PO TABS: 25.0000 mg | ORAL_TABLET | Freq: Every day | ORAL | Status: DC

## 2011-11-24 NOTE — Assessment & Plan Note (Signed)
This could be an ischemic equivalent. Of particular concern considering her risk factors and also the EKG changes. We'll perform  risk stratification Myoview.

## 2011-11-24 NOTE — Patient Instructions (Addendum)
Your physician has requested that you have a lexiscan myoview. For further information please visit [email or web address] Please follow instruction sheet, as given.  Your physician recommends that you continue on your current medications as directed. Please refer to the Current Medication list given to you today.  Your physician recommends that you schedule a follow-up appointment in: 1 year with Dr. Daleen Squibb

## 2011-11-24 NOTE — Progress Notes (Signed)
HPI Amy Rodriguez  returns today for followup of her history of SVT. She has occasional palpitations but nothing sustained. She's been a l ot of stress with her husband in June.  She denies any angina. She does have dyspnea on exertion but denies orthopnea, PND or edema. He's had no syncope or presyncope.  She has not had lipids checked in over a year. She is followed by family practice at [facility].  Past Medical History  Diagnosis Date  . Cervical cancer     s/p hysterectomy/oop  . Palpitations   . Supraventricular tachycardia, paroxysmal   . HTN (hypertension)   . Hyperlipidemia   . Postmenopausal HRT (hormone replacement therapy)   . URI (upper respiratory infection)   . Pharyngitis   . Need for prophylactic vaccination and inoculation against influenza   . Other seborrheic keratosis   . Breast screening, unspecified   . Vaginal pruritus   . Urinary frequency   . Diverticulosis of colon   . Blood in stool   . Pain in joint, lower leg   . Osteopenia   . OA (osteoarthritis)     multiple sites  . IBS (irritable bowel syndrome)   . Hypothyroidism     TSH 14.488 (11/2005)  . H pylori ulcer     not treated due to expense  10/2001  . Arthritis     sed rate 10, RF, CCP pending  . PPD positive     history +PPD 1984, no treatment, no abnormal CXR  . Posterior vitreous detachment 1996  . Multiple pigmented nevi     last derm evaluation 01/2003  . Postmenopausal     s/p hysterectomy for h/o cervical cancer 1982 (both ovaries taken at that time) now on hormonal replacement     Current Outpatient Prescriptions  Medication Sig Dispense Refill  . atenolol (TENORMIN) 25 MG tablet TAKE 1 TABLET BY MOUTH EVERY DAY  30 tablet  3  . fexofenadine (ALLEGRA) 180 MG tablet Take 180 mg by mouth as needed.       . fluticasone (FLONASE) 50 MCG/ACT nasal spray Place 2 sprays into the nose as needed.       Marland Kitchen levothyroxine (SYNTHROID, LEVOTHROID) 50 MCG tablet TAKE 1 TABLET BY MOUTH EVERY DAY  90 tablet   3  . omeprazole (PRILOSEC) 10 MG capsule TAKE 1 CAPSULE DAILY  90 capsule  0  . simvastatin (ZOCOR) 20 MG tablet TAKE 1 TABLET BY MOUTH AT BEDTIME  90 tablet  3  . zoster vaccine live, PF, (ZOSTAVAX) 81191 UNT/0.65ML injection Inject 1 Units into the skin once.  1 vial  0  . DISCONTD: simvastatin (ZOCOR) 20 MG tablet TAKE 1 TABLET BY MOUTH AT BEDTIME  90 tablet  0    Allergies  Allergen Reactions  . Benzonatate     REACTION: vomiting  . Doxycycline     REACTION: nausea  . Sulfonamide Derivatives     REACTION: itching    Family History  Problem Relation Age of Onset  . Breast cancer Mother 54  . Lung cancer Brother 65  . Heart failure Father     congestive    History   Social History  . Marital Status: Married    Spouse Name: N/A    Number of Children: 3  . Years of Education: N/A   Occupational History  . retired     Engineering geologist   Social History Main Topics  . Smoking status: Former Smoker -- 25 years  Types: Cigarettes    Quit date: 04/17/1980  . Smokeless tobacco: Never Used   Comment: passive smoker, husband smokes  . Alcohol Use: No  . Drug Use: No  . Sexually Active: Not on file   Other Topics Concern  . Not on file   Social History Narrative   Lives with husband Fredrick Dray.  She does not drive- he drives her to appointments.  She used to work in Engineering geologist, retired.      ROS ALL NEGATIVE EXCEPT THOSE NOTED IN HPI  PE  General Appearance: well developed, well nourished in no acute distress, overweight HEENT: symmetrical face, PERRLA, good dentition  Neck: no JVD, thyromegaly, or adenopathy, trachea midline Chest: symmetric without deformity Cardiac: PMI non-displaced, RRR, normal S1, S2, no gallop or murmur Lung: clear to ausculation and percussion Vascular: all pulses full without bruits  Abdominal: nondistended, nontender, good bowel sounds, no HSM, no bruits Extremities: no cyanosis, clubbing or edema, no sign of DVT, no varicosities  Skin:  normal color, no rashes Neuro: alert and oriented x 3, non-focal Pysch: normal affect  EKG Sinus bradycardia, ST-T wave changes more prominent than last EKG. Rule out anterolateral ischemia. BMET    Component Value Date/Time   NA 136 09/20/2010 0938   K 4.5 09/20/2010 0938   CL 99 09/20/2010 0938   CO2 26 09/20/2010 0938   GLUCOSE 85 09/20/2010 0938   BUN 12 09/20/2010 0938   CREATININE 0.90 09/20/2010 0938   CREATININE 0.86 03/08/2010 0152   CALCIUM 9.3 09/20/2010 0938   GFRNONAA >60 03/08/2010 0152   GFRAA  Value: >60        The eGFR has been calculated using the MDRD equation. This calculation has not been validated in all clinical situations. eGFR's persistently <60 mL/min signify possible Chronic Kidney Disease. 03/08/2010 0152    Lipid Panel     Component Value Date/Time   CHOL 152 09/20/2010 0938   TRIG 102 09/20/2010 0938   HDL 49 09/20/2010 0938   CHOLHDL 3.1 09/20/2010 0938   VLDL 20 09/20/2010 0938   LDLCALC 83 09/20/2010 0938    CBC    Component Value Date/Time   WBC 6.3 09/20/2010 0938   RBC 4.68 09/20/2010 0938   HGB 13.7 09/20/2010 0938   HCT 41.0 09/20/2010 0938   PLT 198 09/20/2010 0938   MCV 87.6 09/20/2010 0938   MCH 29.3 09/20/2010 0938   MCHC 33.4 09/20/2010 0938   RDW 13.7 09/20/2010 0938   LYMPHSABS 2.0 09/20/2010 0938   MONOABS 0.8 09/20/2010 0938   EOSABS 0.1 09/20/2010 0938   BASOSABS 0.0 09/20/2010 1610

## 2011-11-29 ENCOUNTER — Encounter: Payer: Medicare Other | Admitting: Family Medicine

## 2011-12-04 ENCOUNTER — Ambulatory Visit (HOSPITAL_COMMUNITY): Payer: Medicare Other | Attending: Cardiology | Admitting: Radiology

## 2011-12-04 VITALS — BP 137/84 | HR 51 | Ht 62.0 in | Wt 146.0 lb

## 2011-12-04 DIAGNOSIS — R002 Palpitations: Secondary | ICD-10-CM | POA: Insufficient documentation

## 2011-12-04 DIAGNOSIS — R0602 Shortness of breath: Secondary | ICD-10-CM

## 2011-12-04 DIAGNOSIS — R42 Dizziness and giddiness: Secondary | ICD-10-CM | POA: Insufficient documentation

## 2011-12-04 DIAGNOSIS — R0609 Other forms of dyspnea: Secondary | ICD-10-CM | POA: Insufficient documentation

## 2011-12-04 DIAGNOSIS — R9431 Abnormal electrocardiogram [ECG] [EKG]: Secondary | ICD-10-CM | POA: Insufficient documentation

## 2011-12-04 DIAGNOSIS — R0989 Other specified symptoms and signs involving the circulatory and respiratory systems: Secondary | ICD-10-CM | POA: Insufficient documentation

## 2011-12-04 DIAGNOSIS — I10 Essential (primary) hypertension: Secondary | ICD-10-CM | POA: Insufficient documentation

## 2011-12-04 MED ORDER — REGADENOSON 0.4 MG/5ML IV SOLN
0.4000 mg | Freq: Once | INTRAVENOUS | Status: AC
  Administered 2011-12-04: 0.4 mg via INTRAVENOUS

## 2011-12-04 MED ORDER — TECHNETIUM TC 99M TETROFOSMIN IV KIT
10.0000 | PACK | Freq: Once | INTRAVENOUS | Status: AC | PRN
  Administered 2011-12-04: 10 via INTRAVENOUS

## 2011-12-04 MED ORDER — TECHNETIUM TC 99M TETROFOSMIN IV KIT
30.0000 | PACK | Freq: Once | INTRAVENOUS | Status: AC | PRN
  Administered 2011-12-04: 30 via INTRAVENOUS

## 2011-12-04 NOTE — Progress Notes (Signed)
 [facility] 3 NUCLEAR MED [address] [place] [address] [phone]  Cardiology Nuclear Med Study  Amy Rodriguez is a 78 y.o. female     MRN : [id number]     DOB: 22-Feb-1934  Procedure Date: 12/04/2011  Nuclear Med Background Indication for Stress Test:  Evaluation for Ischemia and Abnormal EKG. History:  '01 MPS:No ischemia, EF=69%; '06 Echo:Normal LVF; h/o PSVT. Cardiac Risk Factors: History of Smoking, Hypertension and Lipids.  Symptoms:  Dizziness, DOE, Fatigue, Palpitations and Rapid HR.   Nuclear Pre-Procedure Caffeine/Decaff Intake:  9:00pm NPO After: 9:00pm   Lungs:  Clear. O2 Sat: 94% on room air. IV 0.9% NS with Angio Cath:  20g  IV Site: R Hand  IV Started by:  Cathlyn Parsons, RN  Chest Size (in):  38 Cup Size: B  Height: 5\' 2"  (1.575 m)  Weight:  146 lb (66.225 kg)  BMI:  Body mass index is 26.70 kg/(m^2). Tech Comments:  Atenolol taken as directed.    Nuclear Med Study 1 or 2 day study: 1 day  Stress Test Type:  Treadmill/Lexiscan  Reading MD: Olga Millers, MD  Order Authorizing Provider:  Annice Pih  Resting Radionuclide: Technetium 19m Tetrofosmin  Resting Radionuclide Dose: 11.0 mCi   Stress Radionuclide:  Technetium 53m Tetrofosmin  Stress Radionuclide Dose: 33.0 mCi           Stress Protocol Rest HR: 51 Stress HR: 103  Rest BP: 137/84 Stress BP: 167/67  Exercise Time (min): 2:00 METS: n/a   Predicted Max HR: 144 bpm % Max HR: 71.53 bpm Rate Pressure Product: 21308   Dose of Adenosine (mg):  n/a Dose of Lexiscan: 0.4 mg  Dose of Atropine (mg): n/a Dose of Dobutamine: n/a mcg/kg/min (at max HR)  Stress Test Technologist: Smiley Houseman, CMA-N  Nuclear Technologist:  Domenic Polite, CNMT     Rest Procedure:  Myocardial perfusion imaging was performed at rest 45 minutes following the intravenous administration of Technetium 63m Tetrofosmin.  Rest ECG: Nonspecific ST-T wave changes with occasional  PAC.  Stress Procedure:  The patient received IV Lexiscan 0.4 mg over 15-seconds with concurrent low level exercise and then Technetium 48m Tetrofosmin was injected at 30-seconds while the patient continued walking one more minute. There were no significant changes with Lexiscan, occasional PAC's and rare PVC noted. Quantitative spect images were obtained after a 45-minute delay.  Stress ECG: No significant ST segment change suggestive of ischemia.  QPS Raw Data Images:  Acquisition technically good; normal left ventricular size. Stress Images:  Normal homogeneous uptake in all areas of the myocardium. Rest Images:  Normal homogeneous uptake in all areas of the myocardium. Subtraction (SDS):  No evidence of ischemia. Transient Ischemic Dilatation (Normal <1.22):  0.87 Lung/Heart Ratio (Normal <0.45):  0.34  Quantitative Gated Spect Images QGS EDV:  66 ml QGS ESV:  16 ml  Impression Exercise Capacity:  Lexiscan with low level exercise. BP Response:  Normal blood pressure response. Clinical Symptoms:  There is dyspnea. ECG Impression:  No significant ST segment change suggestive of ischemia. Comparison with Prior Nuclear Study: No previous nuclear study performed  Overall Impression:  Normal stress nuclear study.  LV Ejection Fraction: 76%.  LV Wall Motion:  NL LV Function; NL Wall Motion   Olga Millers

## 2011-12-05 ENCOUNTER — Ambulatory Visit (INDEPENDENT_AMBULATORY_CARE_PROVIDER_SITE_OTHER): Payer: Medicare Other | Admitting: Family Medicine

## 2011-12-05 ENCOUNTER — Encounter: Payer: Self-pay | Admitting: Family Medicine

## 2011-12-05 VITALS — BP 156/76 | HR 60 | Ht 62.0 in | Wt 146.9 lb

## 2011-12-05 DIAGNOSIS — Z0000 Encounter for general adult medical examination without abnormal findings: Secondary | ICD-10-CM

## 2011-12-05 DIAGNOSIS — E785 Hyperlipidemia, unspecified: Secondary | ICD-10-CM

## 2011-12-05 DIAGNOSIS — E559 Vitamin D deficiency, unspecified: Secondary | ICD-10-CM

## 2011-12-05 DIAGNOSIS — E039 Hypothyroidism, unspecified: Secondary | ICD-10-CM

## 2011-12-05 DIAGNOSIS — I10 Essential (primary) hypertension: Secondary | ICD-10-CM

## 2011-12-05 LAB — COMPREHENSIVE METABOLIC PANEL
ALT: 8 U/L (ref 0–35)
AST: 13 U/L (ref 0–37)
Albumin: 4.2 g/dL (ref 3.5–5.2)
Alkaline Phosphatase: 65 U/L (ref 39–117)
Potassium: 4 mEq/L (ref 3.5–5.3)
Sodium: 138 mEq/L (ref 135–145)
Total Bilirubin: 0.4 mg/dL (ref 0.3–1.2)
Total Protein: 6.7 g/dL (ref 6.0–8.3)

## 2011-12-05 MED ORDER — ZOSTER VACCINE LIVE 19400 UNT/0.65ML ~~LOC~~ SOLR: 1.0000 [IU] | Freq: Once | SUBCUTANEOUS | Status: DC

## 2011-12-05 MED ORDER — AMLODIPINE BESYLATE 5 MG PO TABS: 2.5000 mg | ORAL_TABLET | Freq: Every day | ORAL | Status: DC

## 2011-12-05 NOTE — Patient Instructions (Addendum)
Take nw medicine- amlodipine- one half tablet daily  Follow-up in 6 months  Let me know if you feel you are not continuing to cope and finding ways to enjoy life  See rx for zostavax

## 2011-12-06 MED ORDER — ERGOCALCIFEROL 1.25 MG (50000 UT) PO CAPS: 50000.0000 [IU] | ORAL_CAPSULE | ORAL | Status: DC

## 2011-12-06 NOTE — Assessment & Plan Note (Signed)
Will add low dose amlodipine

## 2011-12-06 NOTE — Assessment & Plan Note (Signed)
Vitamin D low, will supplement 50,000 q weekly x 8 weeks, then will supplement 1000-2000 mg daily.  Will rehceck in 6 months

## 2011-12-06 NOTE — Assessment & Plan Note (Signed)
Only on 50 mcg, TSh on very low sid eof normal  i suspect treating subclinical hypothyroidism.  Discussed with patient, does not recall every having symtpoms,  Will d/c and recheck in 6 months.  Discussed signs of hypothyroidism.  Will call for recheck earlier if feeling symptoms.

## 2011-12-06 NOTE — Assessment & Plan Note (Signed)
LDL at goal 

## 2011-12-06 NOTE — Progress Notes (Signed)
  Subjective:    [name], female    DOB: [birth year], 78 y.o.   MRN: [id number]  HPI Patient here for annual physical.  Since last seen husband has passed away  Has found it difficult and has relied on friends and family for transportatio nas she doesn't drive.  Goes out weekly for church and grocery shopping.  Overall feels like she is healing as well as expected.  Denies sadness outside of normal grieving.  No hopelessness  HYPERTENSION  BP Readings from Last 3 Encounters:  12/05/11 156/76  12/04/11 137/84  11/24/11 142/74    Hypertension ROS: taking medications as instructed, no medication side effects noted, no TIA's, no chest pain on exertion, no dyspnea on exertion, no swelling of ankles and no intermittent claudication symptoms.   HYPERLIPIDEMIA  Diet: Not following low cholesterol diet Exercise: No regular exercise Wt Readings from Last 3 Encounters:  12/05/11 146 lb 14.4 oz (66.633 kg)  12/04/11 146 lb (66.225 kg)  11/24/11 148 lb (67.132 kg)   ROS:  Denies RUQ pain, myalgias, or symptoms or coronary ischemia Lab Results  Component Value Date   LDLCALC 83 09/20/2010   Lab Results  Component Value Date   CHOL 152 09/20/2010   CHOL 143 06/23/2009   CHOL 144 05/13/2008   Lab Results  Component Value Date   HDL 49 09/20/2010   HDL 51 06/23/2009   HDL 40.7 05/13/2008   Lab Results  Component Value Date   TRIG 102 09/20/2010   TRIG 106 06/23/2009   TRIG 83 05/13/2008   Lab Results  Component Value Date   ALT 8 12/05/2011   AST 13 12/05/2011   ALKPHOS 65 12/05/2011   BILITOT 0.4 12/05/2011     Patient Information Form: Screening and ROS  AUDIT-C Score: 0 Do you feel safe in relationships? yes PHQ-2:negative  Review of Symptoms  General:  Negative for nexplained weight loss, fever Skin: Negative for new or changing mole, sore that won't heal HEENT: Negative for trouble hearing, trouble seeing, ringing in ears, mouth sores, hoarseness, change in voice,  dysphagia. CV:  Negative for chest pain, dyspnea, edema, palpitations Resp: Negative for cough, dyspnea, hemoptysis GI: Negative for nausea, vomiting, diarrhea, constipation, abdominal pain, melena, hematochezia. GU: Negative for dysuria, incontinence, urinary hesitance, hematuria, vaginal or penile discharge, polyuria, sexual difficulty, lumps in testicle or breasts MSK: Negative for muscle cramps or aches, joint pain or swelling Neuro: Negative for headaches, weakness, numbness, dizziness, passing out/fainting Psych: Negative for depression, anxiety, memory problems    Review of Systems     Objective:   Physical Exam  GEN: Alert & Oriented, No acute distress CV:  Regular Rate & Rhythm, no murmur Respiratory:  Normal work of breathing, CTAB Abd:  + BS, soft, no tenderness to palpation Ext: no pre-tibial edema       Assessment & Plan:  Zostavax given today

## 2011-12-07 ENCOUNTER — Telehealth: Payer: Self-pay | Admitting: Cardiology

## 2011-12-07 HISTORY — PX: CARDIOVASCULAR STRESS TEST: SHX262

## 2011-12-07 NOTE — Telephone Encounter (Signed)
Fu call °Patient returning your call °

## 2011-12-07 NOTE — Telephone Encounter (Signed)
Pt aware of lexiscan results.  Reassurance given Mylo Red RN

## 2011-12-12 ENCOUNTER — Telehealth: Payer: Self-pay | Admitting: Family Medicine

## 2011-12-12 MED ORDER — HYDROCHLOROTHIAZIDE 25 MG PO TABS: 12.5000 mg | ORAL_TABLET | Freq: Every day | ORAL | Status: DC

## 2011-12-12 NOTE — Telephone Encounter (Signed)
Hearts palpations, self resolves.  No edema or dyspnea.  Will change to HCTZ 12.5.

## 2011-12-12 NOTE — Telephone Encounter (Signed)
Patient states since starting amlodipine  she has had more heart palpitations  through out the day. Usually heart rate is in 50-60 range today was 82,  BP 119/81 at home. Denies any shortness of breath. Will send message to Dr. Earnest Bailey.

## 2011-12-12 NOTE — Telephone Encounter (Signed)
Heart meds were changed and she is having more palpitations now.  Wants to know if this can be changed.

## 2011-12-27 ENCOUNTER — Encounter: Payer: Self-pay | Admitting: Family Medicine

## 2011-12-27 ENCOUNTER — Other Ambulatory Visit: Payer: Self-pay | Admitting: Family Medicine

## 2012-01-04 ENCOUNTER — Other Ambulatory Visit: Payer: Self-pay | Admitting: Family Medicine

## 2012-01-09 ENCOUNTER — Other Ambulatory Visit: Payer: Self-pay | Admitting: Family Medicine

## 2012-01-11 ENCOUNTER — Other Ambulatory Visit: Payer: Self-pay | Admitting: Orthopaedic Surgery

## 2012-01-25 ENCOUNTER — Encounter (HOSPITAL_COMMUNITY): Payer: Self-pay | Admitting: Pharmacy Technician

## 2012-02-01 ENCOUNTER — Encounter (HOSPITAL_COMMUNITY): Admission: RE | Admit: 2012-02-01 | Payer: Medicare Other | Source: Ambulatory Visit

## 2012-02-01 NOTE — H&P (Signed)
TOTAL KNEE ADMISSION H&P  Patient is being admitted for left total knee arthroplasty.  Subjective:  Chief Complaint:left knee pain.  HPI: Amy Rodriguez, 78 y.o. female, has a history of pain and functional disability in the left knee due to arthritis and has failed non-surgical conservative treatments for greater than 12 weeks to includeNSAID's and/or analgesics, corticosteriod injections and flexibility and strengthening excercises.  Onset of symptoms was gradual, starting 2 years ago with gradually worsening course since that time. The patient noted no past surgery on the left knee(s).  Patient currently rates pain in the left knee(s) at 7 out of 10 with activity. Patient has night pain, worsening of pain with activity and weight bearing, pain that interferes with activities of daily living, pain with passive range of motion, crepitus and joint swelling.  Patient has evidence of periarticular osteophytes and joint space narrowing by imaging studies. This patient has had no previous surgery. There is no active infection.  Patient Active Problem List   Diagnosis Date Noted  . Dyspnea on exertion 11/24/2011  . Knee pain, left 12/12/2010  . Hot flashes 09/20/2010  . LEG PAIN, LEFT 03/21/2010  . DYSPNEA 10/08/2009  . VITAMIN D DEFICIENCY 06/28/2009  . PALPITATIONS 12/10/2008  . HYPERTENSION, BENIGN ESSENTIAL 06/03/2008  . HYPERLIPIDEMIA 12/24/2007  . DIVERTICULOSIS OF COLON 05/06/2007  . HYPOTHYROIDISM, UNSPECIFIED 06/14/2006  . TACHYCARDIA, PAROXYSMAL SUPRAVENTRICULAR 06/14/2006  . Irritable bowel syndrome 06/14/2006  . OSTEOARTHRITIS, MULTI SITES 06/14/2006  . PATELLO FEMORAL STRESS SYNDROME 06/14/2006  . OSTEOPENIA 06/14/2006   Past Medical History  Diagnosis Date  . Cervical cancer     s/p hysterectomy/oop  . Palpitations   . Supraventricular tachycardia, paroxysmal   . HTN (hypertension)   . Hyperlipidemia   . Postmenopausal HRT (hormone replacement therapy)   . URI (upper  respiratory infection)   . Pharyngitis   . Need for prophylactic vaccination and inoculation against influenza   . Other seborrheic keratosis   . Breast screening, unspecified   . Vaginal pruritus   . Urinary frequency   . Diverticulosis of colon   . Blood in stool   . Pain in joint, lower leg   . Osteopenia   . OA (osteoarthritis)     multiple sites  . IBS (irritable bowel syndrome)   . Hypothyroidism     TSH 14.488 (11/2005)  . H pylori ulcer     not treated due to expense  10/2001  . Arthritis     sed rate 10, RF, CCP pending  . PPD positive     history +PPD 1984, no treatment, no abnormal CXR  . Posterior vitreous detachment 1996  . Multiple pigmented nevi     last derm evaluation 01/2003  . Postmenopausal     s/p hysterectomy for h/o cervical cancer 1982 (both ovaries taken at that time) now on hormonal replacement     Past Surgical History  Procedure Date  . Total abdominal hysterectomy w/ bilateral salpingoophorectomy 1982    cervical ca  . Shoulder surgery     right    No prescriptions prior to admission   Allergies  Allergen Reactions  . Amlodipine     Heart palpitations   . Benzonatate     REACTION: vomiting  . Doxycycline     REACTION: nausea  . Sulfonamide Derivatives     REACTION: itching    History  Substance Use Topics  . Smoking status: Former Smoker -- 25 years    Types: Cigarettes  Quit date: 04/17/1980  . Smokeless tobacco: Never Used   Comment: passive smoker, husband smokes  . Alcohol Use: No    Family History  Problem Relation Age of Onset  . Breast cancer Mother 59  . Lung cancer Brother 15  . Heart failure Father     congestive     Review of Systems  Constitutional: Negative.   HENT: Negative.   Eyes: Negative.   Respiratory: Negative.   Cardiovascular: Negative.   Gastrointestinal: Negative.   Genitourinary: Negative.   Musculoskeletal: Negative.   Skin: Negative.   Neurological: Negative.   Endo/Heme/Allergies:  Negative.   Psychiatric/Behavioral: Negative.     Objective:  Physical Exam  Constitutional: She appears well-nourished.  HENT:  Head: Atraumatic.  Eyes: EOM are normal.  Neck: Normal range of motion.  Cardiovascular: Normal rate.   Respiratory: Effort normal.  GI: Bowel sounds are normal.  Musculoskeletal:       Left knee has minimal effusion and no scars.  Range of motion 0-1 10.  There is crepitation and medial joint line pain to palpation.  Calf soft and nontender with negative Homans.  Normal neurovascular status distally.  Neurological: She is alert.  Skin: Skin is warm.  Psychiatric: She has a normal mood and affect.    Vital signs in last 24 hours:    Labs:   Estimated Body mass index is 26.87 kg/(m^2) as calculated from the following:   Height as of 12/05/11: 5\' 2" (1.575 m).   Weight as of 12/05/11: 146 lb 14.4 oz(66.633 kg).   Imaging Review Plain radiographs demonstrate severe degenerative joint disease of the left knee(s). The overall alignment ismild varus. The bone quality appears to be satisfactory for age and reported activity level.  Assessment/Plan:  End stage arthritis, left knee   The patient history, physical examination, clinical judgment of the provider and imaging studies are consistent with end stage degenerative joint disease of the left knee(s) and total knee arthroplasty is deemed medically necessary. The treatment options including medical management, injection therapy arthroscopy and arthroplasty were discussed at length. The risks and benefits of total knee arthroplasty were presented and reviewed. The risks due to aseptic loosening, infection, stiffness, patella tracking problems, thromboembolic complications and other imponderables were discussed. The patient acknowledged the explanation, agreed to proceed with the plan and consent was signed. Patient is being admitted for inpatient treatment for surgery, pain control, PT, OT, prophylactic  antibiotics, VTE prophylaxis, progressive ambulation and ADL's and discharge planning. The patient is planning to be discharged home with home health services

## 2012-02-06 ENCOUNTER — Ambulatory Visit (HOSPITAL_COMMUNITY)
Admission: RE | Admit: 2012-02-06 | Discharge: 2012-02-06 | Disposition: A | Discharge status: Home / Self Care | Payer: Medicare Other | Source: Ambulatory Visit | Attending: Orthopaedic Surgery | Admitting: Orthopaedic Surgery

## 2012-02-06 ENCOUNTER — Encounter (HOSPITAL_COMMUNITY)
Admission: RE | Admit: 2012-02-06 | Discharge: 2012-02-06 | Disposition: A | Discharge status: Home / Self Care | Payer: Medicare Other | Source: Ambulatory Visit | Attending: Orthopaedic Surgery | Admitting: Orthopaedic Surgery

## 2012-02-06 ENCOUNTER — Encounter (HOSPITAL_COMMUNITY): Payer: Self-pay

## 2012-02-06 DIAGNOSIS — Z01818 Encounter for other preprocedural examination: Secondary | ICD-10-CM | POA: Insufficient documentation

## 2012-02-06 HISTORY — DX: Other allergy status, other than to drugs and biological substances: Z91.09

## 2012-02-06 HISTORY — DX: Other specified postprocedural states: Z98.890

## 2012-02-06 HISTORY — DX: Nausea with vomiting, unspecified: R11.2

## 2012-02-06 HISTORY — DX: Dermatitis, unspecified: L30.9

## 2012-02-06 HISTORY — DX: Lactose intolerance, unspecified: E73.9

## 2012-02-06 HISTORY — DX: Gastro-esophageal reflux disease without esophagitis: K21.9

## 2012-02-06 HISTORY — DX: Major depressive disorder, single episode, unspecified: F32.9

## 2012-02-06 HISTORY — DX: Migraine, unspecified, not intractable, without status migrainosus: G43.909

## 2012-02-06 HISTORY — DX: Pneumonia, unspecified organism: J18.9

## 2012-02-06 HISTORY — DX: Depression, unspecified: F32.A

## 2012-02-06 HISTORY — DX: Shortness of breath: R06.02

## 2012-02-06 LAB — CBC WITH DIFFERENTIAL/PLATELET
Basophils Absolute: 0 10*3/uL (ref 0.0–0.1)
Basophils Relative: 0 % (ref 0–1)
Eosinophils Absolute: 0.4 10*3/uL (ref 0.0–0.7)
Eosinophils Relative: 6 % — ABNORMAL HIGH (ref 0–5)
HCT: 39.4 % (ref 36.0–46.0)
Lymphocytes Relative: 33 % (ref 12–46)
MCH: 30 pg (ref 26.0–34.0)
MCHC: 36 g/dL (ref 30.0–36.0)
MCV: 83.3 fL (ref 78.0–100.0)
Monocytes Absolute: 0.8 10*3/uL (ref 0.1–1.0)
RDW: 12.8 % (ref 11.5–15.5)

## 2012-02-06 LAB — BASIC METABOLIC PANEL
BUN: 10 mg/dL (ref 6–23)
CO2: 26 mEq/L (ref 19–32)
Calcium: 9.2 mg/dL (ref 8.4–10.5)
Chloride: 100 mEq/L (ref 96–112)
Creatinine, Ser: 0.89 mg/dL (ref 0.50–1.10)

## 2012-02-06 LAB — URINALYSIS, ROUTINE W REFLEX MICROSCOPIC
Hgb urine dipstick: NEGATIVE
Specific Gravity, Urine: 1.01 (ref 1.005–1.030)
Urobilinogen, UA: 1 mg/dL (ref 0.0–1.0)
pH: 7.5 (ref 5.0–8.0)

## 2012-02-06 LAB — ABO/RH: ABO/RH(D): O POS

## 2012-02-06 LAB — TYPE AND SCREEN: Antibody Screen: NEGATIVE

## 2012-02-07 MED ORDER — CEFAZOLIN SODIUM-DEXTROSE 2-3 GM-% IV SOLR
2.0000 g | INTRAVENOUS | Status: AC
  Administered 2012-02-08: 2 g via INTRAVENOUS
  Filled 2012-02-07: qty 50

## 2012-02-08 ENCOUNTER — Inpatient Hospital Stay (HOSPITAL_COMMUNITY)
Admission: RE | Admit: 2012-02-08 | Discharge: 2012-02-14 | DRG: 470 | Disposition: A | Discharge status: Skilled Nursing Facility | Payer: Medicare Other | Source: Ambulatory Visit | Attending: Orthopaedic Surgery | Admitting: Orthopaedic Surgery

## 2012-02-08 ENCOUNTER — Encounter (HOSPITAL_COMMUNITY): Payer: Self-pay | Admitting: General Practice

## 2012-02-08 ENCOUNTER — Ambulatory Visit (HOSPITAL_COMMUNITY): Payer: Medicare Other | Admitting: Anesthesiology

## 2012-02-08 ENCOUNTER — Encounter (HOSPITAL_COMMUNITY)
Admission: RE | Disposition: A | Discharge status: Skilled Nursing Facility | Payer: Self-pay | Source: Ambulatory Visit | Attending: Orthopaedic Surgery

## 2012-02-08 ENCOUNTER — Encounter (HOSPITAL_COMMUNITY): Payer: Self-pay | Admitting: Anesthesiology

## 2012-02-08 DIAGNOSIS — E871 Hypo-osmolality and hyponatremia: Secondary | ICD-10-CM | POA: Diagnosis not present

## 2012-02-08 DIAGNOSIS — I10 Essential (primary) hypertension: Secondary | ICD-10-CM | POA: Diagnosis present

## 2012-02-08 DIAGNOSIS — M25562 Pain in left knee: Secondary | ICD-10-CM

## 2012-02-08 DIAGNOSIS — M1712 Unilateral primary osteoarthritis, left knee: Secondary | ICD-10-CM

## 2012-02-08 DIAGNOSIS — E785 Hyperlipidemia, unspecified: Secondary | ICD-10-CM | POA: Diagnosis present

## 2012-02-08 DIAGNOSIS — M1710 Unilateral primary osteoarthritis, unspecified knee: Principal | ICD-10-CM | POA: Diagnosis present

## 2012-02-08 DIAGNOSIS — M899 Disorder of bone, unspecified: Secondary | ICD-10-CM | POA: Diagnosis present

## 2012-02-08 DIAGNOSIS — E039 Hypothyroidism, unspecified: Secondary | ICD-10-CM | POA: Clinically undetermined

## 2012-02-08 DIAGNOSIS — M179 Osteoarthritis of knee, unspecified: Secondary | ICD-10-CM

## 2012-02-08 HISTORY — PX: TOTAL KNEE ARTHROPLASTY: SHX125

## 2012-02-08 HISTORY — DX: Other intervertebral disc degeneration, lumbosacral region: M51.37

## 2012-02-08 HISTORY — DX: Other intervertebral disc degeneration, lumbosacral region without mention of lumbar back pain or lower extremity pain: M51.379

## 2012-02-08 LAB — GLUCOSE, CAPILLARY: Glucose-Capillary: 146 mg/dL — ABNORMAL HIGH (ref 70–99)

## 2012-02-08 SURGERY — ARTHROPLASTY, KNEE, TOTAL
Anesthesia: Choice | Site: Knee | Laterality: Left | Wound class: Clean

## 2012-02-08 MED ORDER — ONDANSETRON HCL 4 MG/2ML IJ SOLN
INTRAMUSCULAR | Status: DC | PRN
  Administered 2012-02-08: 4 mg via INTRAVENOUS

## 2012-02-08 MED ORDER — PANTOPRAZOLE SODIUM 40 MG PO TBEC
40.0000 mg | DELAYED_RELEASE_TABLET | Freq: Every day | ORAL | Status: DC
  Administered 2012-02-08 – 2012-02-14 (×7): 40 mg via ORAL
  Filled 2012-02-08 (×7): qty 1

## 2012-02-08 MED ORDER — INFLUENZA VIRUS VACC SPLIT PF IM SUSP
0.5000 mL | INTRAMUSCULAR | Status: AC
  Administered 2012-02-09: 0.5 mL via INTRAMUSCULAR
  Filled 2012-02-08 (×2): qty 0.5

## 2012-02-08 MED ORDER — HYDROMORPHONE HCL PF 1 MG/ML IJ SOLN
0.5000 mg | INTRAMUSCULAR | Status: DC | PRN
  Administered 2012-02-08: 1 mg via INTRAVENOUS
  Filled 2012-02-08: qty 1

## 2012-02-08 MED ORDER — HYDROCHLOROTHIAZIDE 25 MG PO TABS
12.5000 mg | ORAL_TABLET | Freq: Every day | ORAL | Status: DC
  Filled 2012-02-08: qty 0.5

## 2012-02-08 MED ORDER — CHLORHEXIDINE GLUCONATE 4 % EX LIQD: 60.0000 mL | Freq: Once | CUTANEOUS | Status: DC

## 2012-02-08 MED ORDER — HYDROCHLOROTHIAZIDE 12.5 MG PO CAPS
12.5000 mg | ORAL_CAPSULE | Freq: Every day | ORAL | Status: DC
  Administered 2012-02-08 – 2012-02-10 (×3): 12.5 mg via ORAL
  Filled 2012-02-08 (×3): qty 1

## 2012-02-08 MED ORDER — ACETAMINOPHEN 650 MG RE SUPP: 650.0000 mg | Freq: Four times a day (QID) | RECTAL | Status: DC | PRN

## 2012-02-08 MED ORDER — FLEET ENEMA 7-19 GM/118ML RE ENEM: 1.0000 | ENEMA | Freq: Once | RECTAL | Status: AC | PRN

## 2012-02-08 MED ORDER — LACTATED RINGERS IV SOLN
INTRAVENOUS | Status: DC | PRN
  Administered 2012-02-08 (×2): via INTRAVENOUS

## 2012-02-08 MED ORDER — PROPOFOL 10 MG/ML IV BOLUS
INTRAVENOUS | Status: DC | PRN
  Administered 2012-02-08: 180 mg via INTRAVENOUS

## 2012-02-08 MED ORDER — NEOSTIGMINE METHYLSULFATE 1 MG/ML IJ SOLN
INTRAMUSCULAR | Status: DC | PRN
  Administered 2012-02-08: 3 mg via INTRAVENOUS

## 2012-02-08 MED ORDER — HYDROMORPHONE HCL PF 1 MG/ML IJ SOLN
INTRAMUSCULAR | Status: AC
  Filled 2012-02-08: qty 1

## 2012-02-08 MED ORDER — ATENOLOL 25 MG PO TABS
25.0000 mg | ORAL_TABLET | Freq: Every day | ORAL | Status: DC
  Administered 2012-02-09 – 2012-02-14 (×6): 25 mg via ORAL
  Filled 2012-02-08 (×8): qty 1

## 2012-02-08 MED ORDER — ASPIRIN EC 325 MG PO TBEC
325.0000 mg | DELAYED_RELEASE_TABLET | Freq: Two times a day (BID) | ORAL | Status: DC
  Administered 2012-02-09 – 2012-02-14 (×11): 325 mg via ORAL
  Filled 2012-02-08 (×13): qty 1

## 2012-02-08 MED ORDER — ONDANSETRON HCL 4 MG/2ML IJ SOLN
4.0000 mg | Freq: Four times a day (QID) | INTRAMUSCULAR | Status: DC | PRN
  Administered 2012-02-10: 4 mg via INTRAVENOUS

## 2012-02-08 MED ORDER — METHOCARBAMOL 100 MG/ML IJ SOLN
500.0000 mg | Freq: Four times a day (QID) | INTRAVENOUS | Status: DC | PRN
  Administered 2012-02-08: 500 mg via INTRAVENOUS
  Filled 2012-02-08 (×3): qty 5

## 2012-02-08 MED ORDER — CEFUROXIME SODIUM 1.5 G IJ SOLR
INTRAMUSCULAR | Status: AC
  Filled 2012-02-08: qty 1.5

## 2012-02-08 MED ORDER — DEXAMETHASONE SODIUM PHOSPHATE 4 MG/ML IJ SOLN
INTRAMUSCULAR | Status: DC | PRN
  Administered 2012-02-08: 4 mg via INTRAVENOUS

## 2012-02-08 MED ORDER — METOCLOPRAMIDE HCL 5 MG/ML IJ SOLN: 10.0000 mg | Freq: Once | INTRAMUSCULAR | Status: DC | PRN

## 2012-02-08 MED ORDER — OXYCODONE HCL 5 MG PO TABS: 5.0000 mg | ORAL_TABLET | Freq: Once | ORAL | Status: DC | PRN

## 2012-02-08 MED ORDER — HYDROMORPHONE HCL PF 1 MG/ML IJ SOLN
0.2500 mg | INTRAMUSCULAR | Status: DC | PRN
  Administered 2012-02-08 (×4): 0.5 mg via INTRAVENOUS

## 2012-02-08 MED ORDER — SIMVASTATIN 20 MG PO TABS
20.0000 mg | ORAL_TABLET | Freq: Every evening | ORAL | Status: DC
  Administered 2012-02-08 – 2012-02-12 (×4): 20 mg via ORAL
  Filled 2012-02-08 (×7): qty 1

## 2012-02-08 MED ORDER — FENTANYL CITRATE 0.05 MG/ML IJ SOLN
50.0000 ug | INTRAMUSCULAR | Status: DC | PRN
  Administered 2012-02-08: 50 ug via INTRAVENOUS

## 2012-02-08 MED ORDER — POLYETHYLENE GLYCOL 3350 17 G PO PACK
17.0000 g | PACK | Freq: Every day | ORAL | Status: DC | PRN
  Administered 2012-02-10 – 2012-02-13 (×2): 17 g via ORAL
  Filled 2012-02-08 (×2): qty 1

## 2012-02-08 MED ORDER — BUPIVACAINE HCL (PF) 0.5 % IJ SOLN
INTRAMUSCULAR | Status: DC | PRN
  Administered 2012-02-08: 25 mL

## 2012-02-08 MED ORDER — MENTHOL 3 MG MT LOZG: 1.0000 | LOZENGE | OROMUCOSAL | Status: DC | PRN

## 2012-02-08 MED ORDER — METHOCARBAMOL 500 MG PO TABS
500.0000 mg | ORAL_TABLET | Freq: Four times a day (QID) | ORAL | Status: DC | PRN
  Filled 2012-02-08: qty 1

## 2012-02-08 MED ORDER — LIDOCAINE HCL (CARDIAC) 20 MG/ML IV SOLN
INTRAVENOUS | Status: DC | PRN
  Administered 2012-02-08: 50 mg via INTRAVENOUS

## 2012-02-08 MED ORDER — FENTANYL CITRATE 0.05 MG/ML IJ SOLN
INTRAMUSCULAR | Status: AC
  Filled 2012-02-08: qty 2

## 2012-02-08 MED ORDER — LACTATED RINGERS IV SOLN
INTRAVENOUS | Status: DC
  Administered 2012-02-08: 12:00:00 via INTRAVENOUS

## 2012-02-08 MED ORDER — ONDANSETRON HCL 4 MG PO TABS: 4.0000 mg | ORAL_TABLET | Freq: Four times a day (QID) | ORAL | Status: DC | PRN

## 2012-02-08 MED ORDER — SODIUM CHLORIDE 0.9 % IR SOLN
Status: DC | PRN
  Administered 2012-02-08: 1000 mL

## 2012-02-08 MED ORDER — OXYCODONE HCL 5 MG PO TABS
5.0000 mg | ORAL_TABLET | ORAL | Status: DC | PRN
  Administered 2012-02-09: 10 mg via ORAL
  Administered 2012-02-09: 5 mg via ORAL
  Administered 2012-02-09: 10 mg via ORAL
  Administered 2012-02-10: 5 mg via ORAL
  Administered 2012-02-10: 10 mg via ORAL
  Filled 2012-02-08 (×2): qty 1
  Filled 2012-02-08 (×3): qty 2

## 2012-02-08 MED ORDER — PHENOL 1.4 % MT LIQD
1.0000 | OROMUCOSAL | Status: DC | PRN
  Administered 2012-02-09: 1 via OROMUCOSAL
  Filled 2012-02-08: qty 177

## 2012-02-08 MED ORDER — METOCLOPRAMIDE HCL 5 MG/ML IJ SOLN
5.0000 mg | Freq: Three times a day (TID) | INTRAMUSCULAR | Status: DC | PRN
  Administered 2012-02-10: 10 mg via INTRAVENOUS
  Filled 2012-02-08 (×2): qty 2

## 2012-02-08 MED ORDER — ACETAMINOPHEN 325 MG PO TABS
650.0000 mg | ORAL_TABLET | Freq: Four times a day (QID) | ORAL | Status: DC | PRN
  Administered 2012-02-10: 650 mg via ORAL
  Filled 2012-02-08: qty 2

## 2012-02-08 MED ORDER — METOCLOPRAMIDE HCL 10 MG PO TABS
5.0000 mg | ORAL_TABLET | Freq: Three times a day (TID) | ORAL | Status: DC | PRN
  Administered 2012-02-10: 10 mg via ORAL
  Filled 2012-02-08: qty 1

## 2012-02-08 MED ORDER — CEFAZOLIN SODIUM-DEXTROSE 2-3 GM-% IV SOLR
2.0000 g | Freq: Four times a day (QID) | INTRAVENOUS | Status: AC
  Administered 2012-02-08 – 2012-02-09 (×2): 2 g via INTRAVENOUS
  Filled 2012-02-08 (×2): qty 50

## 2012-02-08 MED ORDER — MIDAZOLAM HCL 2 MG/2ML IJ SOLN
1.0000 mg | INTRAMUSCULAR | Status: DC | PRN
  Administered 2012-02-08: 2 mg via INTRAVENOUS

## 2012-02-08 MED ORDER — LORATADINE 10 MG PO TABS
10.0000 mg | ORAL_TABLET | Freq: Every day | ORAL | Status: DC
  Administered 2012-02-09 – 2012-02-14 (×4): 10 mg via ORAL
  Filled 2012-02-08 (×7): qty 1

## 2012-02-08 MED ORDER — FLUTICASONE PROPIONATE 50 MCG/ACT NA SUSP
2.0000 | NASAL | Status: DC | PRN
  Administered 2012-02-10: 2 via NASAL
  Filled 2012-02-08: qty 16

## 2012-02-08 MED ORDER — FENTANYL CITRATE 0.05 MG/ML IJ SOLN
INTRAMUSCULAR | Status: DC | PRN
  Administered 2012-02-08 (×2): 50 ug via INTRAVENOUS

## 2012-02-08 MED ORDER — CEFUROXIME SODIUM 1.5 G IJ SOLR
INTRAMUSCULAR | Status: DC | PRN
  Administered 2012-02-08: 1.5 g

## 2012-02-08 MED ORDER — ROCURONIUM BROMIDE 100 MG/10ML IV SOLN
INTRAVENOUS | Status: DC | PRN
  Administered 2012-02-08: 50 mg via INTRAVENOUS

## 2012-02-08 MED ORDER — DIPHENHYDRAMINE HCL 12.5 MG/5ML PO ELIX: 12.5000 mg | ORAL_SOLUTION | ORAL | Status: DC | PRN

## 2012-02-08 MED ORDER — LACTATED RINGERS IV SOLN: INTRAVENOUS | Status: DC

## 2012-02-08 MED ORDER — ACETAMINOPHEN 10 MG/ML IV SOLN
1000.0000 mg | Freq: Four times a day (QID) | INTRAVENOUS | Status: AC
  Administered 2012-02-08 – 2012-02-09 (×4): 1000 mg via INTRAVENOUS
  Filled 2012-02-08 (×4): qty 100

## 2012-02-08 MED ORDER — DOCUSATE SODIUM 100 MG PO CAPS
100.0000 mg | ORAL_CAPSULE | Freq: Two times a day (BID) | ORAL | Status: DC
  Administered 2012-02-08 – 2012-02-14 (×12): 100 mg via ORAL
  Filled 2012-02-08 (×12): qty 1

## 2012-02-08 MED ORDER — MIDAZOLAM HCL 2 MG/2ML IJ SOLN
INTRAMUSCULAR | Status: AC
  Filled 2012-02-08: qty 2

## 2012-02-08 MED ORDER — EPHEDRINE SULFATE 50 MG/ML IJ SOLN
INTRAMUSCULAR | Status: DC | PRN
  Administered 2012-02-08 (×2): 10 mg via INTRAVENOUS

## 2012-02-08 MED ORDER — GLYCOPYRROLATE 0.2 MG/ML IJ SOLN
INTRAMUSCULAR | Status: DC | PRN
  Administered 2012-02-08: 0.4 mg via INTRAVENOUS

## 2012-02-08 SURGICAL SUPPLY — 63 items
BANDAGE ELASTIC 4 VELCRO ST LF (GAUZE/BANDAGES/DRESSINGS) ×2 IMPLANT
BANDAGE ELASTIC 6 VELCRO ST LF (GAUZE/BANDAGES/DRESSINGS) ×2 IMPLANT
BANDAGE ESMARK 6X9 LF (GAUZE/BANDAGES/DRESSINGS) ×1 IMPLANT
BANDAGE GAUZE ELAST BULKY 4 IN (GAUZE/BANDAGES/DRESSINGS) ×2 IMPLANT
BENZOIN TINCTURE PRP APPL 2/3 (GAUZE/BANDAGES/DRESSINGS) ×2 IMPLANT
BLADE SAGITTAL 25.0X1.19X90 (BLADE) ×2 IMPLANT
BLADE SURG ROTATE 9660 (MISCELLANEOUS) IMPLANT
BNDG CMPR 9X6 STRL LF SNTH (GAUZE/BANDAGES/DRESSINGS) ×1
BNDG ELASTIC 6X10 VLCR STRL LF (GAUZE/BANDAGES/DRESSINGS) ×2 IMPLANT
BNDG ESMARK 6X9 LF (GAUZE/BANDAGES/DRESSINGS) ×2
BOWL SMART MIX CTS (DISPOSABLE) ×2 IMPLANT
CEMENT HV SMART SET (Cement) ×4 IMPLANT
CLOTH BEACON ORANGE TIMEOUT ST (SAFETY) ×2 IMPLANT
CLSR STERI-STRIP ANTIMIC 1/2X4 (GAUZE/BANDAGES/DRESSINGS) ×2 IMPLANT
COVER SURGICAL LIGHT HANDLE (MISCELLANEOUS) ×2 IMPLANT
CUFF TOURNIQUET SINGLE 34IN LL (TOURNIQUET CUFF) ×2 IMPLANT
CUFF TOURNIQUET SINGLE 44IN (TOURNIQUET CUFF) IMPLANT
DRAPE EXTREMITY T 121X128X90 (DRAPE) ×2 IMPLANT
DRAPE PROXIMA HALF (DRAPES) ×2 IMPLANT
DRAPE U-SHAPE 47X51 STRL (DRAPES) ×2 IMPLANT
DRSG ADAPTIC 3X8 NADH LF (GAUZE/BANDAGES/DRESSINGS) ×2 IMPLANT
DRSG PAD ABDOMINAL 8X10 ST (GAUZE/BANDAGES/DRESSINGS) ×2 IMPLANT
DURAPREP 26ML APPLICATOR (WOUND CARE) ×2 IMPLANT
ELECT REM PT RETURN 9FT ADLT (ELECTROSURGICAL) ×2
ELECTRODE REM PT RTRN 9FT ADLT (ELECTROSURGICAL) ×1 IMPLANT
EVACUATOR 1/8 PVC DRAIN (DRAIN) IMPLANT
FACESHIELD LNG OPTICON STERILE (SAFETY) ×4 IMPLANT
GLOVE BIO SURGEON STRL SZ8.5 (GLOVE) ×2 IMPLANT
GLOVE BIOGEL PI IND STRL 8 (GLOVE) ×1 IMPLANT
GLOVE BIOGEL PI IND STRL 8.5 (GLOVE) ×1 IMPLANT
GLOVE BIOGEL PI INDICATOR 8 (GLOVE) ×1
GLOVE BIOGEL PI INDICATOR 8.5 (GLOVE) ×1
GLOVE SS BIOGEL STRL SZ 8 (GLOVE) ×1 IMPLANT
GLOVE SUPERSENSE BIOGEL SZ 8 (GLOVE) ×1
GOWN PREVENTION PLUS XLARGE (GOWN DISPOSABLE) ×2 IMPLANT
GOWN PREVENTION PLUS XXLARGE (GOWN DISPOSABLE) ×2 IMPLANT
GOWN STRL NON-REIN LRG LVL3 (GOWN DISPOSABLE) ×2 IMPLANT
HANDPIECE INTERPULSE COAX TIP (DISPOSABLE) ×2
HOOD PEEL AWAY FACE SHEILD DIS (HOOD) ×2 IMPLANT
IMMOBILIZER KNEE 20 (SOFTGOODS)
IMMOBILIZER KNEE 20 THIGH 36 (SOFTGOODS) IMPLANT
IMMOBILIZER KNEE 22 UNIV (SOFTGOODS) ×2 IMPLANT
IMMOBILIZER KNEE 24 THIGH 36 (MISCELLANEOUS) IMPLANT
IMMOBILIZER KNEE 24 UNIV (MISCELLANEOUS)
KIT BASIN OR (CUSTOM PROCEDURE TRAY) ×2 IMPLANT
KIT ROOM TURNOVER OR (KITS) ×2 IMPLANT
MANIFOLD NEPTUNE II (INSTRUMENTS) ×2 IMPLANT
NS IRRIG 1000ML POUR BTL (IV SOLUTION) ×2 IMPLANT
PACK TOTAL JOINT (CUSTOM PROCEDURE TRAY) ×2 IMPLANT
PAD ARMBOARD 7.5X6 YLW CONV (MISCELLANEOUS) ×4 IMPLANT
SET HNDPC FAN SPRY TIP SCT (DISPOSABLE) ×1 IMPLANT
SPONGE GAUZE 4X4 12PLY (GAUZE/BANDAGES/DRESSINGS) ×2 IMPLANT
STAPLER VISISTAT 35W (STAPLE) ×2 IMPLANT
SUCTION FRAZIER TIP 10 FR DISP (SUCTIONS) IMPLANT
SUT VIC AB 0 CT1 27 (SUTURE) ×4
SUT VIC AB 0 CT1 27XBRD ANBCTR (SUTURE) ×2 IMPLANT
SUT VIC AB 2-0 CT1 27 (SUTURE) ×4
SUT VIC AB 2-0 CT1 TAPERPNT 27 (SUTURE) ×2 IMPLANT
SUT VLOC 180 0 24IN GS25 (SUTURE) ×2 IMPLANT
TOWEL OR 17X24 6PK STRL BLUE (TOWEL DISPOSABLE) ×2 IMPLANT
TOWEL OR 17X26 10 PK STRL BLUE (TOWEL DISPOSABLE) ×2 IMPLANT
TRAY FOLEY CATH 14FR (SET/KITS/TRAYS/PACK) ×2 IMPLANT
WATER STERILE IRR 1000ML POUR (IV SOLUTION) ×6 IMPLANT

## 2012-02-08 NOTE — Preoperative (Signed)
Beta Blockers   Reason not to administer Beta Blockers:Pt took Atenolol at 0900 today

## 2012-02-08 NOTE — Progress Notes (Signed)
Orthopedic Tech Progress Note Patient Details:  Amy Rodriguez 03-22-1934 [id number]  CPM Left Knee CPM Left Knee: On Left Knee Flexion (Degrees): 60  Left Knee Extension (Degrees): 0  Additional Comments: trapeze bar   [name] 02/08/2012, 3:38 PM

## 2012-02-08 NOTE — Plan of Care (Signed)
Problem: Consults Goal: Diagnosis- Total Joint Replacement Primary Total Knee Left     

## 2012-02-08 NOTE — Interval H&P Note (Signed)
History and Physical Interval Note:  02/08/2012 11:59 AM  Amy Rodriguez  has presented today for surgery, with the diagnosis of LEFT KNEE DEGENERATIVE JOINT DISEASE  The various methods of treatment have been discussed with the patient and family. After consideration of risks, benefits and other options for treatment, the patient has consented to  Procedure(s) (LRB) with comments: TOTAL KNEE ARTHROPLASTY (Left) as a surgical intervention .  The patient's history has been reviewed, patient examined, no change in status, stable for surgery.  I have reviewed the patient's chart and labs.  Questions were answered to the patient's satisfaction.     Kalyan Barabas G

## 2012-02-08 NOTE — Anesthesia Preprocedure Evaluation (Addendum)
Anesthesia Evaluation  Patient identified by MRN, date of birth, ID band Patient awake    Reviewed: Allergy & Precautions, H&P , NPO status , Patient's Chart, lab work & pertinent test results, reviewed documented beta blocker date and time   History of Anesthesia Complications (+) PONV  Airway Mallampati: II TM Distance: >3 FB Neck ROM: full    Dental  (+) Edentulous Upper and Edentulous Lower   Pulmonary shortness of breath and with exertion, pneumonia -, resolved, former smoker,  breath sounds clear to auscultation        Cardiovascular hypertension, On Medications, On Home Beta Blockers, Pt. on medications and Pt. on home beta blockers + dysrhythmias Supra Ventricular Tachycardia Rhythm:regular     Neuro/Psych  Headaches, PSYCHIATRIC DISORDERS Depression    GI/Hepatic negative GI ROS, Neg liver ROS, GERD-  Medicated and Controlled,  Endo/Other  Hypothyroidism   Renal/GU negative Renal ROS  negative genitourinary   Musculoskeletal   Abdominal   Peds  Hematology negative hematology ROS (+)   Anesthesia Other Findings See surgeon's H&P   Reproductive/Obstetrics negative OB ROS                          Anesthesia Physical Anesthesia Plan  ASA: III  Anesthesia Plan: General   Post-op Pain Management:    Induction: Intravenous  Airway Management Planned: Oral ETT  Additional Equipment:   Intra-op Plan:   Post-operative Plan: Extubation in OR  Informed Consent: I have reviewed the patients History and Physical, chart, labs and discussed the procedure including the risks, benefits and alternatives for the proposed anesthesia with the patient or authorized representative who has indicated his/her understanding and acceptance.   Dental Advisory Given  Plan Discussed with: CRNA, Surgeon and Anesthesiologist  Anesthesia Plan Comments:        Anesthesia Quick Evaluation

## 2012-02-08 NOTE — Anesthesia Procedure Notes (Addendum)
Anesthesia Regional Block:  Femoral nerve block  Pre-Anesthetic Checklist: ,, timeout performed, Correct Patient, Correct Site, Correct Laterality, Correct Procedure, Correct Position, site marked, Risks and benefits discussed,  Surgical consent,  Pre-op evaluation,  At surgeon's request and post-op pain management  Laterality: Left  Prep: chloraprep       Needles:   Needle Type: Other     Needle Length: 9cm  Needle Gauge: 21    Additional Needles:  Procedures: ultrasound guided Femoral nerve block Narrative:  Start time: 02/08/2012 12:11 PM End time: 02/08/2012 12:18 PM Injection made incrementally with aspirations every 5 mL.  Performed by: Personally  Anesthesiologist: C. Frederick MD  Additional Notes: Ultrasound guidance used to: id relevant anatomy, confirm needle position, local anesthetic spread, avoidance of vascular puncture. Picture saved. No complications. Block performed personally by Janetta Hora. Gelene Mink, MD    Femoral nerve block Procedure Name: Intubation Date/Time: 02/08/2012 12:51 PM Performed by: Rogelia Boga Pre-anesthesia Checklist: Patient identified, Timeout performed, Emergency Drugs available, Suction available and Patient being monitored Patient Re-evaluated:Patient Re-evaluated prior to inductionOxygen Delivery Method: Circle system utilized Preoxygenation: Pre-oxygenation with 100% oxygen Intubation Type: IV induction Ventilation: Mask ventilation without difficulty Laryngoscope Size: Miller and 2 Grade View: Grade II Tube size: 7.5 mm Number of attempts: 1 Placement Confirmation: ETT inserted through vocal cords under direct vision,  breath sounds checked- equal and bilateral and positive ETCO2 Secured at: 21 cm Tube secured with: Tape Dental Injury: Teeth and Oropharynx as per pre-operative assessment

## 2012-02-08 NOTE — Transfer of Care (Signed)
Immediate Anesthesia Transfer of Care Note  Patient: Amy Rodriguez  Procedure(s) Performed: Procedure(s) (LRB) with comments: TOTAL KNEE ARTHROPLASTY (Left)  Patient Location: PACU  Anesthesia Type: General  Level of Consciousness: awake, alert  and oriented  Airway & Oxygen Therapy: Patient Spontanous Breathing and Patient connected to nasal cannula oxygen  Post-op Assessment: Report given to PACU RN and Post -op Vital signs reviewed and stable  Post vital signs: Reviewed  Complications: No apparent anesthesia complications

## 2012-02-08 NOTE — Anesthesia Postprocedure Evaluation (Signed)
Anesthesia Post Note  Patient: Amy Rodriguez  Procedure(s) Performed: Procedure(s) (LRB): TOTAL KNEE ARTHROPLASTY (Left)  Anesthesia type: general  Patient location: PACU  Post pain: Pain level controlled  Post assessment: Patient's Cardiovascular Status Stable  Last Vitals:  Filed Vitals:   02/08/12 1530  BP: 136/67  Pulse: 60  Temp:   Resp: 10    Post vital signs: Reviewed and stable  Level of consciousness: sedated  Complications: No apparent anesthesia complications

## 2012-02-08 NOTE — Op Note (Signed)
PREOP DIAGNOSIS: DJD LEFT KNEE POSTOP DIAGNOSIS: DJD LEFT KNEE PROCEDURE: LEFT TKR ANESTHESIA: General and block ATTENDING SURGEON: Kairen Hallinan G ASSISTANTLindwood Qua PA  INDICATIONS FOR PROCEDURE: Amy Rodriguez is a 78 y.o. female who has struggled for a long time with pain due to degenerative arthritis of the left knee.  The patient has failed many conservative non-operative measures and at this point has pain which limits the ability to sleep and walk.  The patient is offered total knee replacement.  Informed operative consent was obtained after discussion of possible risks of anesthesia, infection, neurovascular injury, DVT, and death.  The importance of the post-operative rehabilitation protocol to optimize result was stressed extensively with the patient.  SUMMARY OF FINDINGS AND PROCEDURE:  SHATARRA ARK was taken to the operative suite where under the above anesthesia a left knee replacement was performed.  There were advanced degenerative changes and the bone quality was good.  We used the DePuy system and placed size standard femur, 2.5 tibia, 35 mm all polyethylene patella, and a size 10 spacer.  I did include Zinacef antibiotic in the cement.  The patient was admitted for appropriate post-op care to include perioperative antibiotics and mechanical and pharmacologic measures for DVT prophylaxis.  DESCRIPTION OF PROCEDURE:  SHEKIRA KRATZER was taken to the operative suite where the above anesthesia was applied.  The patient was positioned supine and prepped and draped in normal sterile fashion.  An appropriate time out was performed.  After the administration of Kefzol pre-op antibiotic the leg was elevated and exsanguinated and a tourniquet inflated.  A standard longitudinal incision was made on the anterior knee.  Dissection was carried down to the extensor mechanism.  All appropriate anti-infective measures were used including the pre-operative antibiotic, betadine impregnated  drape, and closed hooded exhaust systems for each member of the surgical team.  A medial parapatellar incision was made in the extensor mechanism and the knee cap flipped and the knee flexed.  Some residual meniscal tissues were removed along with any remaining ACL/PCL tissue.  A guide was placed on the tibia and a flat cut was made on it's superior surface.  An intramedullary guide was placed in the femur and was utilized to make anterior and posterior cuts creating an appropriate flexion gap.  A second intramedullary guide was placed in the femur to make a distal cut properly balancing the knee with an extension gap equal to the flexion gap.  The three bones sized to the above mentioned sizes and the appropriate guides were placed and utilized.  A trial reduction was done and the knee easily came to full extension and the patella tracked well on flexion.  The trial components were removed and all bones were cleaned with pulsatile lavage and then dried thoroughly.  Cement was mixed including antibiotic and was pressurized onto the bones followed by placement of the aforementioned components.  Excess cement was trimmed and pressure was held on the components until the cement had hardened.  The tourniquet was deflated and a small amount of bleeding was controlled with cautery and pressure.  The knee was irrigated thoroughly.  The extensor mechanism was re-approximated with V-loc suture in running fashion.  The knee was flexed and the repair was solid.  The subcutaneous tissues were re-approximated with #0 and #2-0 vicryl and the skin closed with a subcuticular stitch and steristrips.  A sterile dressing was applied.  Intraoperative fluids, EBL, and tourniquet time can be obtained from anesthesia records.  DISPOSITION:  The patient was taken to recovery room in stable condition and admitted for appropriate post-op care to include peri-operative antibiotic and DVT prophylaxis with mechanical and pharmacologic  measures.  Arianne Klinge G 02/08/2012, 2:26 PM

## 2012-02-09 LAB — CBC
HCT: 31.9 % — ABNORMAL LOW (ref 36.0–46.0)
MCV: 83.5 fL (ref 78.0–100.0)
Platelets: 168 10*3/uL (ref 150–400)
RBC: 3.82 MIL/uL — ABNORMAL LOW (ref 3.87–5.11)
RDW: 12.8 % (ref 11.5–15.5)
WBC: 10.5 10*3/uL (ref 4.0–10.5)

## 2012-02-09 LAB — BASIC METABOLIC PANEL
BUN: 11 mg/dL (ref 6–23)
CO2: 24 mEq/L (ref 19–32)
Chloride: 94 mEq/L — ABNORMAL LOW (ref 96–112)
Creatinine, Ser: 0.75 mg/dL (ref 0.50–1.10)
GFR calc Af Amer: >90 mL/min (ref >90)
Potassium: 3.8 mEq/L (ref 3.5–5.1)

## 2012-02-09 MED ORDER — CHLORHEXIDINE GLUCONATE CLOTH 2 % EX PADS
6.0000 | MEDICATED_PAD | Freq: Every day | CUTANEOUS | Status: AC
  Administered 2012-02-12: 6 via TOPICAL

## 2012-02-09 MED ORDER — WHITE PETROLATUM GEL
Status: AC
  Administered 2012-02-09: 05:00:00
  Filled 2012-02-09: qty 5

## 2012-02-09 MED ORDER — OXYCODONE HCL 5 MG PO TABS: 5.0000 mg | ORAL_TABLET | ORAL | Status: DC | PRN

## 2012-02-09 MED ORDER — ASPIRIN 325 MG PO TBEC: 325.0000 mg | DELAYED_RELEASE_TABLET | Freq: Two times a day (BID) | ORAL | Status: DC

## 2012-02-09 MED ORDER — MUPIROCIN 2 % EX OINT
1.0000 "application " | TOPICAL_OINTMENT | Freq: Two times a day (BID) | CUTANEOUS | Status: AC
  Administered 2012-02-09 – 2012-02-13 (×9): 1 via NASAL
  Filled 2012-02-09: qty 22

## 2012-02-09 NOTE — Progress Notes (Signed)
I agree with the following treatment note after reviewing documentation.   [name], [name]   OTR/L Pager: [phone] Office: [phone] .   

## 2012-02-09 NOTE — Clinical Social Work Psychosocial (Signed)
     Clinical Social Work Department BRIEF PSYCHOSOCIAL ASSESSMENT 02/09/2012  Patient:  Amy Rodriguez, Amy Rodriguez     Account Number:  [id number]     Admit date:  02/08/2012  Clinical Social Worker:  Tiburcio Pea  Date/Time:  02/09/2012 01:23 PM  Referred by:  Physician  Date Referred:  02/09/2012 Referred for  SNF Placement   Other Referral:   Interview type:  Patient Other interview type:    PSYCHOSOCIAL DATA Living Status:  WITH ADULT CHILDREN Admitted from facility:   Level of care:   Primary support name:  Myles Gip Primary support relationship to patient:  CHILD, ADULT Degree of support available:   Strong support    CURRENT CONCERNS Current Concerns  Post-Acute Placement   Other Concerns:   Pt prearranged d/c to short term SNF at [facility]    SOCIAL WORK ASSESSMENT / PLAN Met with pateint today- she has pretoured and arranged for d/c to [facility] for short term SNF.  CSW was aware of patient through the preadmission process.  Spoke with Donne Hazel- Admissions Director of [facility]. She is aware of patient and is planning d/c to SNF when stable. Fl2 forwarded to facility for review.  Plan d/c tomorrow if stable per MD.  [facility] has been requested.   Assessment/plan status:  Psychosocial Support/Ongoing Assessment of Needs Other assessment/ plan:   Information/referral to community resources:   SNF bed list deferred as patient has prechosen a SNF  Discussed possible aftercare needs to be arranged by SNF if indicated.    PATIENTS/FAMILYS RESPONSE TO PLAN OF CARE: Patient is alert, oriented and very pleasant. She has made her own arrangements to go to [facility] and is pleased with this d/c plan.  Patient lives with her son and states that she simply needs a little therapy to get stronger before she returns home.  Will ask weekend CSW to arrange d/c if ok per MD in a.m.

## 2012-02-09 NOTE — Progress Notes (Addendum)
Physical Therapy Treatment Patient Details Name: Amy Rodriguez MRN: [id number] DOB: Feb 20, 1934 Today's Date: 02/09/2012 Time: 3086-5784 PT Time Calculation (min): 26 min  PT Assessment / Plan / Recommendation Comments on Treatment Session  Pt is making excellent progress with mobility. Pt agreeable to d/c to prearranged SNF for continued PT services. Pt will continue to benefit from skilled PT services to regain Independence.    Follow Up Recommendations  Post-acute rehab/ SNF     Does the patient have the potential to tolerate intense rehabilitation     Barriers to Discharge Decreased caregiver support son is disabled and can not provide physical assistance.    Equipment Recommendations  None recommended by PT    Recommendations for Other Services    Frequency 7X/week   Plan Discharge plan remains appropriate    Precautions / Restrictions Precautions Precautions: Knee Restrictions Weight Bearing Restrictions: Yes LLE Weight Bearing: Weight bearing as tolerated   Pertinent Vitals/Pain     Mobility  Bed Mobility Bed Mobility: Sit to Supine Supine to Sit: 5: Supervision Details for Bed Mobility Assistance: cues for technique Transfers Transfers: Sit to Stand Sit to Stand: 4: Min guard Details for Transfer Assistance: cues for technique and hand placement Ambulation/Gait Ambulation/Gait Assistance: 4: Min guard Ambulation Distance (Feet): 85 Feet Assistive device: Rolling walker Ambulation/Gait Assistance Details: c/o increased knee pain with gait Gait Pattern: Step-to pattern;Decreased stance time - right Gait velocity: decreased    Exercises Total Joint Exercises Ankle Circles/Pumps: AROM;Strengthening;Both;10 reps;Seated Quad Sets: AROM;Strengthening;Left;10 reps;Seated Straight Leg Raises: AROM;Strengthening;Left;5 reps;Supine Long Arc Quad: AROM;Strengthening;Left;5 reps;Seated Knee Flexion: AAROM;Strengthening;Left;5 reps;Seated Goniometric ROM: 0-60    PT Diagnosis: Difficulty walking  PT Problem List: Decreased strength;Decreased range of motion;Decreased activity tolerance;Decreased mobility;Decreased knowledge of use of DME PT Treatment Interventions: DME instruction;Gait training;Stair training;Therapeutic activities;Therapeutic exercise;Patient/family education   PT Goals Acute Rehab PT Goals PT Goal Formulation: With patient Time For Goal Achievement: 02/16/12 Potential to Achieve Goals: Good Pt will go Supine/Side to Sit: with modified independence PT Goal: Supine/Side to Sit - Progress: Progressing toward goal Pt will go Sit to Stand: with modified independence PT Goal: Sit to Stand - Progress: Progressing toward goal Pt will go Stand to Sit: with modified independence PT Goal: Stand to Sit - Progress: Progressing toward goal Pt will Transfer Bed to Chair/Chair to Bed: with modified independence PT Transfer Goal: Bed to Chair/Chair to Bed - Progress: Goal set today Pt will Ambulate: >150 feet PT Goal: Ambulate - Progress: Progressing toward goal Pt will Go Up / Down Stairs: 3-5 stairs;with min assist PT Goal: Up/Down Stairs - Progress: Goal set today Pt will Perform Home Exercise Program: Independently PT Goal: Perform Home Exercise Program - Progress: Progressing toward goal  Visit Information  Last PT Received On: 02/09/12 Assistance Needed: +1    Subjective Data  Subjective: I would consider going home with HHPT Patient Stated Goal: To take care of herself.   Cognition  Overall Cognitive Status: Appears within functional limits for tasks assessed/performed Arousal/Alertness: Awake/alert Orientation Level: Appears intact for tasks assessed Behavior During Session: [facility] for tasks performed    Balance  Balance Balance Assessed: Yes Static Standing Balance Static Standing - Balance Support: Bilateral upper extremity supported Static Standing - Level of Assistance: 5: Stand by assistance  End of Session PT - End  of Session Equipment Utilized During Treatment: Gait belt Activity Tolerance: Patient tolerated treatment well Patient left: in bed;with call bell/phone within reach Nurse Communication: Mobility status CPM Left Knee CPM Left  Knee: Other (Comment) (pt declined)   GP     Greggory Stallion 02/09/2012, 10:52 AM

## 2012-02-09 NOTE — Evaluation (Signed)
I agree with the following treatment note after reviewing documentation.   [name], Seaborn Nakama Brynn   OTR/L Pager: [phone] Office: [phone] .   

## 2012-02-09 NOTE — Discharge Summary (Deleted)
[name] MRN: [id number] DOB/AGE: May 05, 1933 78 y.o.  Admit date: 02/08/2012 Discharge date: 02/10/2012  Admission Diagnoses:  Principal Problem:  *Left knee DJD   Discharge Diagnoses:  Same  Past Medical History  Diagnosis Date  . Palpitations   . Supraventricular tachycardia, paroxysmal   . HTN (hypertension)   . Hyperlipidemia   . Postmenopausal HRT (hormone replacement therapy)   . URI (upper respiratory infection)   . Pharyngitis   . Other seborrheic keratosis   . Breast screening, unspecified   . Vaginal pruritus   . Urinary frequency   . Diverticulosis of colon   . Blood in stool   . Pain in joint, lower leg   . Osteopenia   . IBS (irritable bowel syndrome)   . Hypothyroidism     TSH 14.488 (11/2005)  . H pylori ulcer     not treated due to expense  10/2001  . PPD positive     history +PPD 1984, no treatment, no abnormal CXR  . Posterior vitreous detachment 1996  . Multiple pigmented nevi     last derm evaluation 01/2003  . Postmenopausal     s/p hysterectomy for h/o cervical cancer 1982 (both ovaries taken at that time) now on hormonal replacement   . Shortness of breath     with ambulation  . Environmental allergies   . Lactose intolerance   . GERD (gastroesophageal reflux disease)   . Hand dermatitis   . Cervical cancer     s/p hysterectomy/oop  . PONV (postoperative nausea and vomiting)     "and takes me a long time to come out under it" (02/08/2012)  . Pneumonia     "couple times in my lifetime" (02/08/2012)  . History of bronchitis     "w/a cold" (02/08/2012)  . Migraines     "get them very rarely now" (02/08/2012)  . OA (osteoarthritis)     multiple sites  . Arthritis     sed rate 10, RF, CCP pending  . DDD (degenerative disc disease), lumbosacral   . Osteoporosis     "borderline" (02/08/2012)  . Depression     Due to husbands passing away 09/22/2011    Surgeries: Procedure(s): TOTAL KNEE ARTHROPLASTY on 02/08/2012   Consultants:    Discharged Condition: Improved  Hospital Course: Amy Rodriguez is an 78 y.o. female who was admitted 02/08/2012 for operative treatment ofLeft knee DJD. Patient has severe unremitting pain that affects sleep, daily activities, and work/hobbies. After pre-op clearance the patient was taken to the operating room on 02/08/2012 and underwent  Procedure(s): TOTAL KNEE ARTHROPLASTY.    Patient was given perioperative antibiotics: Anti-infectives     Start     Dose/Rate Route Frequency Ordered Stop   02/08/12 1815   ceFAZolin (ANCEF) IVPB 2 g/50 mL premix        2 g 100 mL/hr over 30 Minutes Intravenous 4 times per day 02/08/12 1643 02/09/12 0104   02/08/12 1319   cefUROXime (ZINACEF) injection  Status:  Discontinued          As needed 02/08/12 1320 02/08/12 1451   02/07/12 1444   ceFAZolin (ANCEF) IVPB 2 g/50 mL premix        2 g 100 mL/hr over 30 Minutes Intravenous 60 min pre-op 02/07/12 1444 02/08/12 1254           Patient was given sequential compression devices, early ambulation, and chemoprophylaxis to prevent DVT. ASA 325 1 po BID for 14 days.  May be WBAT.  Patient benefited maximally from hospital stay and there were no complications.    Recent vital signs: Patient Vitals for the past 24 hrs:  BP Temp Temp src Pulse Resp SpO2  02/09/12 0844 - - - - - 97 %  02/09/12 0626 125/57 mmHg 97.3 F (36.3 C) - 55  16  100 %  02/09/12 0400 - - - - 17  96 %  02/09/12 0020 - 97.2 F (36.2 C) Axillary - - -  02/09/12 0000 - - - - 16  96 %  02/08/12 2200 111/53 mmHg 95.7 F (35.4 C) - 51  16  98 %  02/08/12 2000 - - - - 17  97 %  02/08/12 1631 109/66 mmHg - - 51  14  98 %  02/08/12 1600 141/57 mmHg 97 F (36.1 C) - 58  12  95 %  02/08/12 1545 138/70 mmHg - - 53  13  95 %  02/08/12 1530 136/67 mmHg - - 60  10  96 %  02/08/12 1515 128/68 mmHg - - 63  11  97 %  02/08/12 1500 137/68 mmHg - - 71  17  98 %  02/08/12 1458 142/66 mmHg 97.5 F (36.4 C) - 71  18  98 %      Recent laboratory studies:  Basename 02/09/12 0530 02/27/12 1555  WBC 10.5 7.5  HGB 10.9* 14.2  HCT 31.9* 39.4  PLT 168 191  NA 129* 134*  K 3.8 4.4  CL 94* 100  CO2 24 26  BUN 11 10  CREATININE 0.75 0.89  GLUCOSE 155* 90  INR -- 1.05  CALCIUM 8.3* --     Discharge Medications:     Medication List     As of 02/09/2012  1:34 PM    TAKE these medications         aspirin 325 MG EC tablet   Take 1 tablet (325 mg total) by mouth 2 (two) times daily.      atenolol 25 MG tablet   Commonly known as: TENORMIN   Take 25 mg by mouth daily.      fexofenadine 180 MG tablet   Commonly known as: ALLEGRA   Take 180 mg by mouth daily as needed. Allergies      fluticasone 50 MCG/ACT nasal spray   Commonly known as: FLONASE   Place 2 sprays into the nose as needed. Allergies      hydrochlorothiazide 25 MG tablet   Commonly known as: HYDRODIURIL   Take 12.5 mg by mouth daily.      omeprazole 10 MG capsule   Commonly known as: PRILOSEC   Take 10 mg by mouth daily.      oxyCODONE 5 MG immediate release tablet   Commonly known as: Oxy IR/ROXICODONE   Take 1-2 tablets (5-10 mg total) by mouth every 3 (three) hours as needed.      simvastatin 20 MG tablet   Commonly known as: ZOCOR   Take 20 mg by mouth every evening.        Diagnostic Studies: Dg Chest 2 View  02-27-12  *RADIOLOGY REPORT*  Clinical Data: Preop for left knee replacement  CHEST - 2 VIEW  Comparison: 09/20/2010  Findings: Cardiomediastinal silhouette is stable.  Mild hyperinflation again noted.  No acute infiltrate or pleural effusion.  No pulmonary edema.  IMPRESSION: No active disease.  No significant change.   Original Report Authenticated By: Natasha Mead, M.D.     Disposition:  May be WBAT.      Follow-up Information    Follow up with DALLDORF,PETER G, MD. Call in 2 weeks.   Contact information:   [address] [place] [address] [phone]           Signed: Prince Rome 02/09/2012, 1:34 PM

## 2012-02-09 NOTE — Progress Notes (Signed)
Foley d/c at 0615. Pt due to void.

## 2012-02-09 NOTE — Progress Notes (Signed)
Utilization review complete 

## 2012-02-09 NOTE — Progress Notes (Signed)
Subjective: 1 Day Post-Op Procedure(s) (LRB): TOTAL KNEE ARTHROPLASTY (Left)  Activity level:  oob today  snf sunday Diet tolerance:  ok Voiding:  ok Patient reports pain as 2 on 0-10 scale.    Objective: Vital signs in last 24 hours: Temp:  [95.7 F (35.4 C)-97.5 F (36.4 C)] 97.3 F (36.3 C) (10/25 0626) Pulse Rate:  [48-71] 55  (10/25 0626) Resp:  [10-18] 16  (10/25 0626) BP: (109-151)/(53-75) 125/57 mmHg (10/25 0626) SpO2:  [95 %-100 %] 100 % (10/25 0626)  Labs:  Basename 02/09/12 0530 02/06/12 1555  HGB 10.9* 14.2    Basename 02/09/12 0530 02/06/12 1555  WBC 10.5 7.5  RBC 3.82* 4.73  HCT 31.9* 39.4  PLT 168 191    Basename 02/09/12 0530 02/06/12 1555  NA 129* 134*  K 3.8 4.4  CL 94* 100  CO2 24 26  BUN 11 10  CREATININE 0.75 0.89  GLUCOSE 155* 90  CALCIUM 8.3* 9.2    Basename 02/06/12 1555  LABPT --  INR 1.05    Physical Exam:  Neurologically intact ABD soft Neurovascular intact Sensation intact distally Intact pulses distally Dorsiflexion/Plantar flexion intact Incision: dressing C/D/I No cellulitis present Compartment soft  Assessment/Plan:  1 Day Post-Op Procedure(s) (LRB): TOTAL KNEE ARTHROPLASTY (Left) Advance diet Up with therapy D/C IV fluids Discharge to SNF Sat or Sunday Will repeat bmet sat. ASA 325 BID x 2 weeks  Change dressing sat - order in     Breylin Dom R 02/09/2012, 8:12 AM

## 2012-02-09 NOTE — Clinical Social Work Placement (Addendum)
    Clinical Social Work Department CLINICAL SOCIAL WORK PLACEMENT NOTE 02/09/2012  Patient:  QUINETTA, SHILLING  Account Number:  [id number] Admit date:  02/08/2012  Clinical Social Worker:  Tiburcio Pea  Date/time:  02/09/2012 01:46 PM  Clinical Social Work is seeking post-discharge placement for this patient at the following level of care:   SKILLED NURSING   (*CSW will update this form in Epic as items are completed)     Patient/family provided with [facility] Department of Clinical Social Work's list of facilities offering this level of care within the geographic area requested by the patient (or if unable, by the patient's family).    Patient/family informed of their freedom to choose among providers that offer the needed level of care, that participate in Medicare, Medicaid or managed care program needed by the patient, have an available bed and are willing to accept the patient.    Patient/family informed of [facility]' ownership interest in [facility], as well as of the fact that they are under no obligation to receive care at this facility.  PASARR submitted to EDS on 02/09/2012 PASARR number received from EDS on 02/09/2012  FL2 transmitted to all facilities in geographic area requested by pt/family on  02/09/2012 FL2 transmitted to all facilities within larger geographic area on   Patient informed that his/her managed care company has contracts with or will negotiate with  certain facilities, including the following:   [employer]-- [facility]   Auth requested     Patient/family informed of bed offers received:  02/09/2012 Patient chooses bed at [facility] Physician recommends and patient chooses bed at    Patient to be transferred to [facility] on  02/14/12 Patient to be transferred to facility by ambulance Sharin Mons)  The following physician request were entered in Epic:   Additional Comments: OK for d/c today to [facility]. Notified  SNF and pt's nurse Meriam Sprague.  Patient is pleased with d/c plan. She states that she has notified her son of d/c plan.  No further CSW intervention indicated. Lorri Frederick. [place]  [phone]

## 2012-02-09 NOTE — Progress Notes (Signed)
CARE MANAGEMENT NOTE 02/09/2012  Patient:  Amy, Rodriguez   Account Number:  [id number]  Date Initiated:  02/09/2012  Documentation initiated by:  Vance Peper  Subjective/Objective Assessment:   78 yr old female s/p left total knee arthroplasty.     Action/Plan:   Patient will be going to SNF-[facility]-for shortterm rehab. Social Worker is aware.   Anticipated DC Date:  02/10/2012   Anticipated DC Plan:  SKILLED NURSING FACILITY  In-house referral  Clinical Social Worker      DC Planning Services  CM consult      Choice offered to / List presented to:             Status of service:  Completed, signed off Medicare Important Message given?   (If response is "NO", the following Medicare IM given date fields will be blank) Date Medicare IM given:   Date Additional Medicare IM given:    Discharge Disposition:  SKILLED NURSING FACILITY  Per UR Regulation:    If discussed at Long Length of Stay Meetings, dates discussed:    Comments:

## 2012-02-09 NOTE — Evaluation (Signed)
Occupational Therapy Evaluation Patient Details Name: Amy Rodriguez MRN: [id number] DOB: 14-Oct-1933 Today's Date: 02/09/2012 Time: 4098-1191 OT Time Calculation (min): 19 min  OT Assessment / Plan / Recommendation Clinical Impression  Pt. 78 yo female s/p left TKA. Pt is doing very well and plans to d/c to SNF to recieve more rehab prior to returning home. No further acute OT needs at this time.     OT Assessment  Patient does not need any further OT services    Follow Up Recommendations  Skilled nursing facility    Barriers to Discharge      Equipment Recommendations  None recommended by OT    Recommendations for Other Services    Frequency       Precautions / Restrictions Precautions Precautions: Knee Restrictions Weight Bearing Restrictions: Yes LLE Weight Bearing: Weight bearing as tolerated   Pertinent Vitals/Pain Pain 8/10, RN had just given pain meds.     ADL  Grooming: Performed;Wash/dry hands;Supervision/safety Where Assessed - Grooming: Unsupported standing Upper Body Dressing: Performed;Independent Where Assessed - Upper Body Dressing: Supported standing Lower Body Dressing: Performed;Modified independent Where Assessed - Lower Body Dressing: Supported sit to Pharmacist, hospital: Research scientist (life sciences) Method: Sit to Barista: Regular height toilet Toileting - Clothing Manipulation and Hygiene: Performed;Independent Where Assessed - Toileting Clothing Manipulation and Hygiene: Sit on 3-in-1 or toilet Tub/Shower Transfer: Simulated;Supervision/safety Tub/Shower Transfer Method: Science writer: Walk in shower Transfers/Ambulation Related to ADLs: Supervision for safety  ADL Comments: Able to dress UB independently and dress LB with mod independence (requires extra time). Able to transfer to toilet with supervision and complete grooming tasks at sink level with supervision.    OT  Diagnosis:    OT Problem List:   OT Treatment Interventions:     OT Goals    Visit Information  Last OT Received On: 02/09/12 Assistance Needed: +1    Subjective Data  Subjective: I am in a lot of pain but I guess I will do whatever you need me to do.  Patient Stated Goal: To get better and eventually go home   Prior Functioning     Home Living Lives With: Son Available Help at Discharge: Other (Comment) (no physical (A) from son) Type of Home: House Home Access: Stairs to enter [employer] of Steps: 4 Entrance Stairs-Rails: Can reach both Home Layout: One level Bathroom Shower/Tub: Health visitor: Standard Bathroom Accessibility: Yes How Accessible: Accessible via walker Home Adaptive Equipment: Walker - rolling;Straight cane Prior Function Level of Independence: Independent Able to Take Stairs?: Yes Driving: No Vocation: Retired Musician: No difficulties Dominant Hand: Right         Vision/Perception     Cognition  Overall Cognitive Status: Appears within functional limits for tasks assessed/performed Arousal/Alertness: Awake/alert Orientation Level: Appears intact for tasks assessed Behavior During Session: [facility] for tasks performed    Extremity/Trunk Assessment Right Lower Extremity Assessment RLE ROM/Strength/Tone: Within functional levels Left Lower Extremity Assessment LLE ROM/Strength/Tone: Deficits LLE ROM/Strength/Tone Deficits: knee flexion/extension 3/5, knee AAROM 0-65 LLE Sensation: WFL - Light Touch Trunk Assessment Trunk Assessment: Normal     Mobility Bed Mobility Bed Mobility: Sit to Supine;Supine to Sit Supine to Sit: 5: Supervision Sit to Supine: 5: Supervision Details for Bed Mobility Assistance: cues for technique Transfers Transfers: Sit to Stand;Stand to Sit Sit to Stand: 5: Supervision;With upper extremity assist;From bed;From toilet Stand to Sit: 5: Supervision;To bed;To  toilet;With upper extremity assist Details for Transfer Assistance: cues  for safe hand placement          Exercise Total Joint Exercises Ankle Circles/Pumps: AROM;Strengthening;Both;10 reps;Seated Quad Sets: AROM;Strengthening;Left;10 reps;Seated Straight Leg Raises: AROM;Strengthening;Left;5 reps;Supine Long Arc Quad: AROM;Strengthening;Left;5 reps;Seated Knee Flexion: AAROM;Strengthening;Left;5 reps;Seated Goniometric ROM: 0-60   Balance Balance Balance Assessed: Yes Static Standing Balance Static Standing - Balance Support: Bilateral upper extremity supported Static Standing - Level of Assistance: 5: Stand by assistance   End of Session OT - End of Session Equipment Utilized During Treatment: Gait belt Activity Tolerance: Patient tolerated treatment well Patient left: in bed;with call bell/phone within reach Nurse Communication: Mobility status CPM Left Knee CPM Left Knee: Other (Comment) (pt declined)  GO     Cleora Fleet 02/09/2012, 12:17 PM

## 2012-02-09 NOTE — Progress Notes (Signed)
Occupational Therapy Discharge Patient Details Name: Amy Rodriguez MRN: [id number] DOB: 19-May-1933 Today's Date: 02/09/2012 Time: 5621-3086 OT Time Calculation (min): 19 min  Patient discharged from OT services secondary to Pt. doing well able to complete ADL's, transfers and ambulation at supervision level, plan to d/c to SNF. No further acute OT needs at this time.   Please see latest therapy progress note for current level of functioning and progress toward goals.    Progress and discharge plan discussed with patient and/or caregiver: Patient/Caregiver agrees with plan  GO     Cleora Fleet 02/09/2012, 12:18 PM

## 2012-02-09 NOTE — Evaluation (Addendum)
Physical Therapy Evaluation Patient Details Name: Amy Rodriguez MRN: [id number] DOB: 09-28-1933 Today's Date: 02/09/2012 Time: 0810-0840 PT Time Calculation (min): 30 min  PT Assessment / Plan / Recommendation Clinical Impression  Pt is s/p Left TKR who presents with dependencies in mobility and Independence. Pt has decreased caregiver support and has arranged SNF placement for continued rehab to reach Independent level. Pt would benefit from skilled PT to focus on independence with mobility and strength. Recommend D/C to SNF.    PT Assessment  Patient needs continued PT services    Follow Up Recommendations  Post-acute rehab, Other (comment) (pt requesting SNF if not Independent)    Does the patient have the potential to tolerate intense rehabilitation      Barriers to Discharge Decreased caregiver support son is disabled and can not provide physical assistance.    Equipment Recommendations  None recommended by PT    Recommendations for Other Services     Frequency 7X/week    Precautions / Restrictions Precautions Precautions: Knee Restrictions Weight Bearing Restrictions: Yes LLE Weight Bearing: Weight bearing as tolerated   Pertinent Vitals/Pain       Mobility  Bed Mobility Bed Mobility: Supine to Sit Supine to Sit: 5: Supervision Details for Bed Mobility Assistance: cues for technique Transfers Transfers: Sit to Stand Sit to Stand: 4: Min guard;From bed Details for Transfer Assistance: cues for technique Ambulation/Gait Ambulation/Gait Assistance: 4: Min guard Ambulation Distance (Feet): 5 Feet Assistive device: Rolling walker Gait Pattern: Step-to pattern;Decreased stance time - right;Decreased hip/knee flexion - left Gait velocity: decreased    Shoulder Instructions     Exercises Total Joint Exercises Ankle Circles/Pumps: AROM;Strengthening;Both;10 reps;Supine Quad Sets: AROM;Strengthening;Both;10 reps;Supine Straight Leg Raises:  AROM;Strengthening;10 reps;Supine;Both Long Arc Quad: AROM;Strengthening;10 reps;Both;Seated Knee Flexion: AAROM;Strengthening;Left;10 reps;Seated Goniometric ROM: 0-60   PT Diagnosis: Difficulty walking  PT Problem List: Decreased strength;Decreased range of motion;Decreased activity tolerance;Decreased mobility;Decreased knowledge of use of DME PT Treatment Interventions: DME instruction;Gait training;Stair training;Therapeutic activities;Therapeutic exercise;Patient/family education   PT Goals Acute Rehab PT Goals PT Goal Formulation: With patient Time For Goal Achievement: 02/16/12 Potential to Achieve Goals: Good Pt will go Supine/Side to Sit: with modified independence PT Goal: Supine/Side to Sit - Progress: Goal set today Pt will go Sit to Stand: with modified independence PT Goal: Sit to Stand - Progress: Goal set today Pt will go Stand to Sit: with modified independence PT Goal: Stand to Sit - Progress: Goal set today Pt will Transfer Bed to Chair/Chair to Bed: with modified independence PT Transfer Goal: Bed to Chair/Chair to Bed - Progress: Goal set today Pt will Ambulate: >150 feet PT Goal: Ambulate - Progress: Goal set today Pt will Go Up / Down Stairs: 3-5 stairs;with min assist PT Goal: Up/Down Stairs - Progress: Goal set today Pt will Perform Home Exercise Program: Independently PT Goal: Perform Home Exercise Program - Progress: Goal set today  Visit Information  Last PT Received On: 02/09/12 Assistance Needed: +1    Subjective Data  Subjective: [name]. Patient Stated Goal: To walk   Prior Functioning  Home Living Lives With: Son Available Help at Discharge: Other (Comment) (no physical assistance from son) Type of Home: House Home Access: Stairs to enter [employer] of Steps: 4 Entrance Stairs-Rails: Can reach both Home Layout: One level Home Adaptive Equipment: Walker - rolling;Straight cane Prior Function Level of  Independence: Independent Able to Take Stairs?: Yes Driving: No Vocation: Retired Musician: No difficulties  Cognition  Overall Cognitive Status: Appears within functional limits for tasks assessed/performed Arousal/Alertness: Awake/alert Orientation Level: Appears intact for tasks assessed Behavior During Session: [facility] for tasks performed    Extremity/Trunk Assessment Right Lower Extremity Assessment RLE ROM/Strength/Tone: Within functional levels Left Lower Extremity Assessment LLE ROM/Strength/Tone: Deficits LLE ROM/Strength/Tone Deficits: knee flexion/extension 3/5, knee AAROM 0-65 LLE Sensation: WFL - Light Touch Trunk Assessment Trunk Assessment: Normal   Balance Balance Balance Assessed: Yes Static Standing Balance Static Standing - Balance Support: During functional activity Static Standing - Level of Assistance: 5: Stand by assistance  End of Session PT - End of Session Equipment Utilized During Treatment: Gait belt Activity Tolerance: Patient tolerated treatment well Patient left: in chair Nurse Communication: Mobility status CPM Left Knee CPM Left Knee: Off Left Knee Flexion (Degrees): 60  Left Knee Extension (Degrees): 0   GP     Greggory Stallion 02/09/2012, 9:05 AM

## 2012-02-10 DIAGNOSIS — E871 Hypo-osmolality and hyponatremia: Secondary | ICD-10-CM | POA: Diagnosis not present

## 2012-02-10 LAB — BASIC METABOLIC PANEL
BUN: 9 mg/dL (ref 6–23)
BUN: 9 mg/dL (ref 6–23)
CO2: 26 mEq/L (ref 19–32)
CO2: 27 mEq/L (ref 19–32)
Chloride: 85 mEq/L — ABNORMAL LOW (ref 96–112)
Chloride: 83 mEq/L — ABNORMAL LOW (ref 96–112)
GFR calc non Af Amer: 83 mL/min — ABNORMAL LOW (ref >90)
Glucose, Bld: 120 mg/dL — ABNORMAL HIGH (ref 70–99)
Glucose, Bld: 124 mg/dL — ABNORMAL HIGH (ref 70–99)
Potassium: 4.1 mEq/L (ref 3.5–5.1)
Potassium: 4.1 mEq/L (ref 3.5–5.1)
Sodium: 120 mEq/L — ABNORMAL LOW (ref 135–145)
Sodium: 116 mEq/L — CL (ref 135–145)

## 2012-02-10 LAB — CBC
HCT: 28.2 % — ABNORMAL LOW (ref 36.0–46.0)
Hemoglobin: 9.9 g/dL — ABNORMAL LOW (ref 12.0–15.0)
MCHC: 35.1 g/dL (ref 30.0–36.0)
RBC: 3.49 MIL/uL — ABNORMAL LOW (ref 3.87–5.11)

## 2012-02-10 MED ORDER — METOCLOPRAMIDE HCL 5 MG/ML IJ SOLN
10.0000 mg | Freq: Four times a day (QID) | INTRAMUSCULAR | Status: DC | PRN
  Administered 2012-02-11: 10 mg via INTRAVENOUS

## 2012-02-10 MED ORDER — SODIUM CHLORIDE 0.9 % IV SOLN
INTRAVENOUS | Status: DC
  Administered 2012-02-10: 09:00:00 via INTRAVENOUS

## 2012-02-10 MED ORDER — HYDROCODONE-ACETAMINOPHEN 5-325 MG PO TABS
1.0000 | ORAL_TABLET | ORAL | Status: DC | PRN
  Administered 2012-02-11 – 2012-02-12 (×4): 1 via ORAL
  Administered 2012-02-12: 2 via ORAL
  Administered 2012-02-12: 1 via ORAL
  Administered 2012-02-13: 2 via ORAL
  Administered 2012-02-13 – 2012-02-14 (×3): 1 via ORAL
  Filled 2012-02-10: qty 1
  Filled 2012-02-10: qty 2
  Filled 2012-02-10 (×4): qty 1
  Filled 2012-02-10: qty 2
  Filled 2012-02-10 (×2): qty 1
  Filled 2012-02-10: qty 2
  Filled 2012-02-10: qty 1

## 2012-02-10 NOTE — Progress Notes (Signed)
Subjective: 2 Days Post-Op Procedure(s) (LRB): TOTAL KNEE ARTHROPLASTY (Left) Patient reports pain as 5 on 0-10 scale.   Patient sitting up in chair.  Mild nausea.  Taking by mouth okay.  No confusion.  Objective: Vital signs in last 24 hours: Temp:  [97.6 F (36.4 C)] 97.6 F (36.4 C) (10/26 0624) Pulse Rate:  [69] 69  (10/26 0624) Resp:  [18] 18  (10/26 0800) BP: (140-150)/(63) 140/63 mmHg (10/26 0624) SpO2:  [94 %-99 %] 99 % (10/26 0624)  Intake/Output from previous day: 10/25 0701 - 10/26 0700 In: 240 [P.O.:240] Out: 200 [Urine:200] Intake/Output this shift: Total I/O In: 120 [P.O.:120] Out: -    Basename 02/10/12 0458 02/09/12 0530  HGB 9.9* 10.9*    Basename 02/10/12 0458 02/09/12 0530  WBC 11.2* 10.5  RBC 3.49* 3.82*  HCT 28.2* 31.9*  PLT 149* 168    Basename 02/10/12 0458 02/09/12 0530  NA 120* 129*  K 4.1 3.8  CL 85* 94*  CO2 26 24  BUN 9 11  CREATININE 0.68 0.75  GLUCOSE 120* 155*  CALCIUM 8.1* 8.3*   No results found for this basename: LABPT:2,INR:2 in the last 72 hours Left knee exam: Neurovascular intact Sensation intact distally Intact pulses distally Dorsiflexion/Plantar flexion intact Incision: no drainage Compartment soft No significant edema in bilateral lower extremities.  Assessment/Plan: 2 Days Post-Op Procedure(s) (LRB): TOTAL KNEE ARTHROPLASTY (Left) Hyponatremia Plan: Will start fluid restriction diet. Decrease IV to [facility].  she already received HCTZ dose today, will hold tomorrow. Check BMET now and in the a.m. Continue physical therapy. Aspirin twice daily for DVT prophylaxis.  Continue SCDs. Not ready for discharge to SNF today, but may be in one to 2 days. If no improvement in hyponatremia in a.m., will consider medical consult.   Kelsi Benham G 02/10/2012, 2:02 PM

## 2012-02-10 NOTE — Progress Notes (Signed)
Physical Therapy Treatment Patient Details Name: Amy Rodriguez MRN: [id number] DOB: 1933-05-28 Today's Date: 02/10/2012 Time: 4098-1191 PT Time Calculation (min): 29 min  PT Assessment / Plan / Recommendation Comments on Treatment Session  Patient limited this session by nausea. She had already been given meds by RN. Patient reviewed HEP. Will attempt to see again this afternoon with attempts of increasing ambulation.     Follow Up Recommendations  Home health PT     Does the patient have the potential to tolerate intense rehabilitation     Barriers to Discharge        Equipment Recommendations  None recommended by PT;None recommended by OT    Recommendations for Other Services    Frequency 7X/week   Plan Discharge plan remains appropriate;Frequency remains appropriate    Precautions / Restrictions Precautions Precautions: Knee Restrictions LLE Weight Bearing: Weight bearing as tolerated   Pertinent Vitals/Pain Patient limited by nausea this session    Mobility  Bed Mobility Bed Mobility: Not assessed Transfers Sit to Stand: 4: Min guard;With upper extremity assist;From chair/3-in-1 Stand to Sit: 4: Min guard;With upper extremity assist;To chair/3-in-1 Details for Transfer Assistance: Patient stood x3 with cues for safe hand placement Ambulation/Gait Ambulation/Gait Assistance: 4: Min assist Ambulation Distance (Feet): 10 Feet Assistive device: Rolling walker Ambulation/Gait Assistance Details: Patient limited by nausea this session. Cues for sequence and posture needed this session Gait Pattern: Step-to pattern;Trunk flexed    Exercises Total Joint Exercises Ankle Circles/Pumps: AROM;Left;15 reps Quad Sets: AROM;Left;15 reps Short Arc QuadBarbaraann Rodriguez;Left;15 reps Heel Slides: AAROM;Left;15 reps Hip ABduction/ADduction: AROM;Left;15 reps Straight Leg Raises: AAROM;Left;15 reps   PT Diagnosis:    PT Problem List:   PT Treatment Interventions:     PT Goals Acute  Rehab PT Goals PT Goal: Sit to Stand - Progress: Progressing toward goal PT Goal: Stand to Sit - Progress: Progressing toward goal PT Transfer Goal: Bed to Chair/Chair to Bed - Progress: Progressing toward goal PT Goal: Ambulate - Progress: Not progressing  Visit Information  Last PT Received On: 02/10/12 Assistance Needed: +1    Subjective Data      Cognition  Overall Cognitive Status: Appears within functional limits for tasks assessed/performed Arousal/Alertness: Awake/alert Orientation Level: Appears intact for tasks assessed Behavior During Session: [facility] for tasks performed    Balance     End of Session PT - End of Session Equipment Utilized During Treatment: Gait belt Activity Tolerance: Treatment limited secondary to medical complications (Comment) (nausea) Patient left: in chair;with call bell/phone within reach Nurse Communication: Mobility status   GP     Fredrich Birks 02/10/2012, 10:12 AM 02/10/2012 Fredrich Birks PTA [phone] pager [phone] office

## 2012-02-10 NOTE — Clinical Social Work Note (Signed)
Clinical Social Work  CSW spoke with RN, who shared that pt's discharge has been delayed due to medical issues. Anticipated discharge will be Sun or Monday. CSW updated [employer] and they are agreeable. CSW will continue to follow.  Dede Query, MSW, Theresia Majors [phone]

## 2012-02-10 NOTE — Progress Notes (Signed)
Critical value of 116 called from lab - PA notified. No new orders. Recheck in am. Follow current orders.

## 2012-02-10 NOTE — Progress Notes (Signed)
Physical Therapy Treatment Patient Details Name: KARINE GARN MRN: [id number] DOB: 02-06-1934 Today's Date: 02/10/2012 Time: 6578-4696 PT Time Calculation (min): 21 min  PT Assessment / Plan / Recommendation Comments on Treatment Session  Patient refuses ambulation this session due to pain and just getting back into bed. Agreeable to therex and CPM    Follow Up Recommendations  Home health PT     Does the patient have the potential to tolerate intense rehabilitation     Barriers to Discharge        Equipment Recommendations  None recommended by OT;None recommended by PT    Recommendations for Other Services    Frequency 7X/week   Plan Discharge plan remains appropriate;Frequency remains appropriate    Precautions / Restrictions Precautions Precautions: Knee Restrictions LLE Weight Bearing: Weight bearing as tolerated   Pertinent Vitals/Pain     Mobility       Exercises Total Joint Exercises Ankle Circles/Pumps: AROM;Left;15 reps Quad Sets: AAROM;Left;15 reps Short Arc QuadBarbaraann Boys;Left;15 reps Heel Slides: AAROM;Left;15 reps Hip ABduction/ADduction: AAROM;Left;15 reps Straight Leg Raises: AAROM;Left;15 reps   PT Diagnosis:    PT Problem List:   PT Treatment Interventions:     PT Goals Acute Rehab PT Goals PT Goal: Perform Home Exercise Program - Progress: Progressing toward goal  Visit Information  Last PT Received On: 02/10/12 Assistance Needed: +1    Subjective Data      Cognition  Overall Cognitive Status: Appears within functional limits for tasks assessed/performed Arousal/Alertness: Awake/alert Orientation Level: Appears intact for tasks assessed Behavior During Session: [facility] for tasks performed    Balance     End of Session PT - End of Session Activity Tolerance: Patient tolerated treatment well Patient left: in bed;with call bell/phone within reach Nurse Communication: Mobility status CPM Left Knee Left Knee Flexion (Degrees): 60  Left  Knee Extension (Degrees): 0    GP     Robinette, Adline Potter 02/10/2012, 2:27 PM 02/10/2012 Fredrich Birks PTA [phone] pager [phone] office

## 2012-02-11 DIAGNOSIS — M1710 Unilateral primary osteoarthritis, unspecified knee: Secondary | ICD-10-CM

## 2012-02-11 DIAGNOSIS — E871 Hypo-osmolality and hyponatremia: Secondary | ICD-10-CM

## 2012-02-11 DIAGNOSIS — IMO0002 Reserved for concepts with insufficient information to code with codable children: Secondary | ICD-10-CM

## 2012-02-11 LAB — BASIC METABOLIC PANEL
BUN: 10 mg/dL (ref 6–23)
CO2: 25 mEq/L (ref 19–32)
Calcium: 8.1 mg/dL — ABNORMAL LOW (ref 8.4–10.5)
Chloride: 83 mEq/L — ABNORMAL LOW (ref 96–112)
GFR calc Af Amer: >90 mL/min (ref >90)
GFR calc non Af Amer: 81 mL/min — ABNORMAL LOW (ref >90)
GFR calc non Af Amer: 82 mL/min — ABNORMAL LOW (ref >90)
Glucose, Bld: 120 mg/dL — ABNORMAL HIGH (ref 70–99)
Potassium: 4.3 mEq/L (ref 3.5–5.1)
Sodium: 115 mEq/L — CL (ref 135–145)
Sodium: 119 mEq/L — CL (ref 135–145)

## 2012-02-11 LAB — CBC
HCT: 26.1 % — ABNORMAL LOW (ref 36.0–46.0)
Hemoglobin: 9.4 g/dL — ABNORMAL LOW (ref 12.0–15.0)
MCH: 28.5 pg (ref 26.0–34.0)
MCHC: 36 g/dL (ref 30.0–36.0)
MCV: 79.1 fL (ref 78.0–100.0)
RBC: 3.3 MIL/uL — ABNORMAL LOW (ref 3.87–5.11)

## 2012-02-11 LAB — SODIUM, URINE, RANDOM: Sodium, Ur: 29 mEq/L

## 2012-02-11 MED ORDER — SODIUM CHLORIDE 0.9 % IV SOLN: INTRAVENOUS | Status: DC

## 2012-02-11 MED ORDER — SODIUM CHLORIDE 0.9 % IV SOLN
INTRAVENOUS | Status: DC
  Administered 2012-02-11 – 2012-02-13 (×3): via INTRAVENOUS

## 2012-02-11 NOTE — Consult Note (Signed)
[facility] Medical Consultation  ELLIOTT QUADE GNF:[id number] DOB: 03-13-1934 DOA: 02/08/2012 PCP: Delbert Harness, MD   Requesting physician: Dr  Date of consultation: 10/27 Reason for consultation: Hyponatremia  Impression/Recommendations Principal Problem:  *Left knee DJD Active Problems:  Hyponatremia   1. Hyponatremia: probably a combination of post op, dehydration, medications like HCTZ,  pain etc. We will have to rule out SIADH. Patient did not respond to fluid restriction. Patient appears slightly dehydrated and with blood pressures running soft. We will send worK UP including, TSH, serum osmolality, urine osmolality, urine sodium, am cortisol level etc. We will do a trial of fluids for 24 hours and monitor electrolytes. Patient is currently symptomatic with nausea, she is not confused or altered.   2. Left Knee DJD: s/p surgery and followed orthopedics.   I will followup again tomorrow. Please contact me if I can be of assistance in the meanwhile. Thank you for this consultation.  Chief Complaint: left knee pain  HPI:  78 year old lady with multiple medical problems came in for severe left knee pain secondary to left DJD. She underwent total knee arthoplasty on the left. She was found to be hyponatremic which was not responding to fluid restrction and HCTZ, and we were consulted.   Review of Systems:  See HPI otherwise neg.  Past Medical History  Diagnosis Date  . Palpitations   . Supraventricular tachycardia, paroxysmal   . HTN (hypertension)   . Hyperlipidemia   . Postmenopausal HRT (hormone replacement therapy)   . URI (upper respiratory infection)   . Pharyngitis   . Other seborrheic keratosis   . Breast screening, unspecified   . Vaginal pruritus   . Urinary frequency   . Diverticulosis of colon   . Blood in stool   . Pain in joint, lower leg   . Osteopenia   . IBS (irritable bowel syndrome)   . Hypothyroidism     TSH 14.488 (11/2005)  . H pylori  ulcer     not treated due to expense  10/2001  . PPD positive     history +PPD 1984, no treatment, no abnormal CXR  . Posterior vitreous detachment 1996  . Multiple pigmented nevi     last derm evaluation 01/2003  . Postmenopausal     s/p hysterectomy for h/o cervical cancer 1982 (both ovaries taken at that time) now on hormonal replacement   . Shortness of breath     with ambulation  . Environmental allergies   . Lactose intolerance   . GERD (gastroesophageal reflux disease)   . Hand dermatitis   . Cervical cancer     s/p hysterectomy/oop  . PONV (postoperative nausea and vomiting)     "and takes me a long time to come out under it" (02/08/2012)  . Pneumonia     "couple times in my lifetime" (02/08/2012)  . History of bronchitis     "w/a cold" (02/08/2012)  . Migraines     "get them very rarely now" (02/08/2012)  . OA (osteoarthritis)     multiple sites  . Arthritis     sed rate 10, RF, CCP pending  . DDD (degenerative disc disease), lumbosacral   . Osteoporosis     "borderline" (02/08/2012)  . Depression     Due to husbands passing away 09/22/2011   Past Surgical History  Procedure Date  . Eye surgery     cataract removal right eye  . Colonoscopy   . Total knee arthroplasty 02/08/2012  left  . Total abdominal hysterectomy 1982  . Cataract extraction w/ intraocular lens implant ?1997    right   Social History:  reports that she quit smoking about 31 years ago. Her smoking use included Cigarettes. She has a 25 pack-year smoking history. She has never used smokeless tobacco. She reports that she does not drink alcohol or use illicit drugs.  Allergies  Allergen Reactions  . Benzonatate Nausea And Vomiting    "tessalon pearl"  . Doxycycline Nausea And Vomiting  . Sulfonamide Derivatives Itching and Rash  . Amlodipine Other (See Comments)    Heart palpitations    Family History  Problem Relation Age of Onset  . Breast cancer Mother 21  . Lung cancer Brother 37    . Heart failure Father     congestive    Prior to Admission medications   Medication Sig Start Date End Date Taking? Authorizing Provider  atenolol (TENORMIN) 25 MG tablet Take 25 mg by mouth daily.   Yes Historical Provider, MD  fexofenadine (ALLEGRA) 180 MG tablet Take 180 mg by mouth daily as needed. Allergies   Yes Historical Provider, MD  fluticasone (FLONASE) 50 MCG/ACT nasal spray Place 2 sprays into the nose as needed. Allergies   Yes Historical Provider, MD  hydrochlorothiazide (HYDRODIURIL) 25 MG tablet Take 12.5 mg by mouth daily.   Yes Historical Provider, MD  omeprazole (PRILOSEC) 10 MG capsule Take 10 mg by mouth daily.   Yes Historical Provider, MD  simvastatin (ZOCOR) 20 MG tablet Take 20 mg by mouth every evening.   Yes Historical Provider, MD  aspirin EC 325 MG EC tablet Take 1 tablet (325 mg total) by mouth 2 (two) times daily. 02/09/12   Prince Rome, PA  oxyCODONE (OXY IR/ROXICODONE) 5 MG immediate release tablet Take 1-2 tablets (5-10 mg total) by mouth every 3 (three) hours as needed. 02/09/12   Prince Rome, PA   Physical Exam: Blood pressure 125/58, pulse 62, temperature 98 F (36.7 C), temperature source Oral, resp. rate 18, SpO2 95.00%. Filed Vitals:   02/10/12 1453 02/10/12 2051 02/11/12 0419 02/11/12 0800  BP: 127/62 135/60 125/58   Pulse: 60 61 62   Temp: 97.9 F (36.6 C) 98.4 F (36.9 C) 98 F (36.7 C)   TempSrc: Oral Oral    Resp: 16 17 17 18   SpO2: 96% 96% 95%     Constitutional: Vital signs reviewed.  Patient is a well-developed and well-nourished  in no acute distress and cooperative with exam. Alert and oriented x3.  Head: Normocephalic and atraumatic Mouth: no erythema or exudates, MMM Eyes: PERRL, EOMI, conjunctivae normal, No scleral icterus.  Neck: Supple, Trachea midline normal ROM, No JVD, mass, thyromegaly, or carotid bruit present.  Cardiovascular: RRR, S1 normal, S2 normal, no MRG, pulses symmetric and intact  bilaterally Pulmonary/Chest: CTAB, no wheezes, rales, or rhonchi Abdominal: Soft. Non-tender, non-distended, bowel sounds are normal, no masses, organomegaly, or guarding present.  Musculoskeletal: left knee wrapped Neurological: A&O x3, Strength is normal and symmetric bilaterally, cranial nerve II-XII are grossly intact, no focal motor deficit, sensory intact to light touch bilaterally.  Skin: Warm, dry and intact. No rash, cyanosis, or clubbing.  Psychiatric: Normal mood and affect. speech and behavior is normal. Judgment and thought content normal    Labs on Admission:  Basic Metabolic Panel:  Lab 02/11/12 4098 02/10/12 1456 02/10/12 0458 02/09/12 0530 02/06/12 1555  NA 115* 116* 120* 129* 134*  K 3.9 4.1 4.1 3.8 4.4  CL  80* 83* 85* 94* 100  CO2 25 27 26 24 26   GLUCOSE 120* 124* 120* 155* 90  BUN 10 9 9 11 10   CREATININE 0.74 0.67 0.68 0.75 0.89  CALCIUM 8.1* 7.8* 8.1* 8.3* 9.2  MG -- -- -- -- --  PHOS -- -- -- -- --   Liver Function Tests: No results found for this basename: AST:5,ALT:5,ALKPHOS:5,BILITOT:5,PROT:5,ALBUMIN:5 in the last 168 hours No results found for this basename: LIPASE:5,AMYLASE:5 in the last 168 hours No results found for this basename: AMMONIA:5 in the last 168 hours CBC:  Lab 02/11/12 0635 02/10/12 0458 02/09/12 0530 02/06/12 1555  WBC 13.4* 11.2* 10.5 7.5  NEUTROABS -- -- -- 3.8  HGB 9.4* 9.9* 10.9* 14.2  HCT 26.1* 28.2* 31.9* 39.4  MCV 79.1 80.8 83.5 83.3  PLT 166 149* 168 191   Cardiac Enzymes: No results found for this basename: CKTOTAL:5,CKMB:5,CKMBINDEX:5,TROPONINI:5 in the last 168 hours BNP: No components found with this basename: POCBNP:5 CBG:  Lab 02/08/12 2221  GLUCAP 146*    Radiological Exams on Admission: No results found.    Time spent: 58 min  Sweta Halseth [facility] Pager [phone]  If 7PM-7AM, please contact night-coverage [email or web address] Password [email or web address] 02/11/2012, 10:15 AM

## 2012-02-11 NOTE — Progress Notes (Signed)
Physical Therapy Treatment Patient Details Name: Amy Rodriguez MRN: [id number] DOB: 1933-09-25 Today's Date: 02/11/2012 Time: 4098-1191 PT Time Calculation (min): 14 min  PT Assessment / Plan / Recommendation Comments on Treatment Session  Pt's mobility cont's to be limited by nausea per pt.  After ambulation, pt deferred performing any more LE ther-ex.  RN notified of pt's pain (8/10 Lt knee) & nausea.      Follow Up Recommendations  Post acute inpatient     Does the patient have the potential to tolerate intense rehabilitation  No, Recommend SNF  Barriers to Discharge        Equipment Recommendations  None recommended by OT;None recommended by PT    Recommendations for Other Services    Frequency 7X/week   Plan Discharge plan remains appropriate;Frequency remains appropriate    Precautions / Restrictions Precautions Precautions: Knee Restrictions LLE Weight Bearing: Weight bearing as tolerated    Pertinent Vitals/Pain No pain reported upon arrival & 8/10 Lt knee with ambulation.  Pt also c/o nausea.  RN notified.      Mobility  Bed Mobility Bed Mobility: Supine to Sit;Sitting - Scoot to Edge of Bed Supine to Sit: 5: Supervision Sitting - Scoot to Edge of Bed: 5: Supervision Details for Bed Mobility Assistance: cues for hooking technique of RLE to manage LLE.  Increased time.   Transfers Transfers: Sit to Stand;Stand to Sit Sit to Stand: 5: Supervision;With upper extremity assist;From bed Stand to Sit: 5: Supervision;With armrests;To chair/3-in-1 Details for Transfer Assistance: Cues for safest hand placement.   Ambulation/Gait Ambulation/Gait Assistance: 4: Min guard Ambulation Distance (Feet): 20 Feet Assistive device: Rolling walker Ambulation/Gait Assistance Details: Distance limited by nausea.  Cues to stay inside RW, safety with turns, & increased heel strike.   Gait Pattern: Step-to pattern;Decreased weight shift to left;Left flexed knee in  stance;Antalgic;Trunk flexed Gait velocity: decreased Stairs: No Wheelchair Mobility Wheelchair Mobility: No    Exercises Total Joint Exercises Ankle Circles/Pumps: AROM;Both;20 reps Quad Sets: AROM;Both;15 reps Hip ABduction/ADduction: AAROM;Left;15 reps   PT  PT Goals Acute Rehab PT Goals Time For Goal Achievement: 02/16/12 Potential to Achieve Goals: Good PT Goal: Supine/Side to Sit - Progress: Not met Pt will go Sit to Stand: with modified independence PT Goal: Sit to Stand - Progress: Progressing toward goal Pt will go Stand to Sit: with modified independence PT Goal: Stand to Sit - Progress: Progressing toward goal Pt will Transfer Bed to Chair/Chair to Bed: with modified independence Pt will Ambulate: >150 feet PT Goal: Ambulate - Progress: Progressing toward goal (slowly) Pt will Go Up / Down Stairs: 3-5 stairs;with min assist Pt will Perform Home Exercise Program: Independently PT Goal: Perform Home Exercise Program - Progress: Progressing toward goal  Visit Information  Last PT Received On: 02/11/12 Assistance Needed: +1    Subjective Data  Subjective: "I dont feel like im on top of the world"   Cognition  Overall Cognitive Status: Appears within functional limits for tasks assessed/performed Arousal/Alertness: Awake/alert Orientation Level: Appears intact for tasks assessed Behavior During Session: [facility] for tasks performed    Balance     End of Session PT - End of Session Equipment Utilized During Treatment: Gait belt Activity Tolerance: Other (comment) (limited by nausea) Patient left: in chair;with call bell/phone within reach Nurse Communication: Mobility status;Other (comment) (pain & nausea)     Verdell Face, PTA [phone] 02/11/2012

## 2012-02-11 NOTE — Progress Notes (Signed)
Subjective: 3 Days Post-Op Procedure(s) (LRB): TOTAL KNEE ARTHROPLASTY (Left) Patient reports pain as 4 on 0-10 scale.  No dizziness. No obvious confusion. Fluid restriction started yest and IV at [facility] after phone discussion with renal service. Taking po/voiding ok.  Objective: Vital signs in last 24 hours: Temp:  [97.9 F (36.6 C)-98.4 F (36.9 C)] 98 F (36.7 C) (10/27 0419) Pulse Rate:  [60-62] 62  (10/27 0419) Resp:  [16-18] 18  (10/27 0800) BP: (125-135)/(58-62) 125/58 mmHg (10/27 0419) SpO2:  [95 %-96 %] 95 % (10/27 0419)  Intake/Output from previous day: 10/26 0701 - 10/27 0700 In: 120 [P.O.:120] Out: -  Intake/Output this shift:     Basename 02/11/12 0635 02/10/12 0458 02/09/12 0530  HGB 9.4* 9.9* 10.9*    Basename 02/11/12 0635 02/10/12 0458  WBC 13.4* 11.2*  RBC 3.30* 3.49*  HCT 26.1* 28.2*  PLT 166 149*    Basename 02/11/12 0635 02/10/12 1456  NA 115* 116*  K 3.9 4.1  CL 80* 83*  CO2 25 27  BUN 10 9  CREATININE 0.74 0.67  GLUCOSE 120* 124*  CALCIUM 8.1* 7.8*   No results found for this basename: LABPT:2,INR:2 in the last 72 hours Left knee exam: Neurovascular intact Sensation intact distally Intact pulses distally Dorsiflexion/Plantar flexion intact Incision: no drainage Compartment soft Pt alert and oriented.  Assessment/Plan: 3 Days Post-Op Procedure(s) (LRB): TOTAL KNEE ARTHROPLASTY (Left) Hyponatremia  Plan: Up with therapy Will get medical consult to manage hyponatremia. Can go to SNF when med issues resolved.  Monish Haliburton G 02/11/2012, 12:09 PM

## 2012-02-11 NOTE — Progress Notes (Signed)
Na 115 called to PA.  For medical consult.

## 2012-02-11 NOTE — Progress Notes (Signed)
Serum osmolality critical value called from lab - Dr Blake Divine paged.

## 2012-02-12 ENCOUNTER — Encounter (HOSPITAL_COMMUNITY): Payer: Self-pay | Admitting: Orthopaedic Surgery

## 2012-02-12 DIAGNOSIS — M79609 Pain in unspecified limb: Secondary | ICD-10-CM

## 2012-02-12 DIAGNOSIS — M1710 Unilateral primary osteoarthritis, unspecified knee: Secondary | ICD-10-CM

## 2012-02-12 DIAGNOSIS — IMO0002 Reserved for concepts with insufficient information to code with codable children: Secondary | ICD-10-CM

## 2012-02-12 DIAGNOSIS — E871 Hypo-osmolality and hyponatremia: Secondary | ICD-10-CM

## 2012-02-12 LAB — BASIC METABOLIC PANEL
CO2: 27 mEq/L (ref 19–32)
Chloride: 92 mEq/L — ABNORMAL LOW (ref 96–112)
Chloride: 95 mEq/L — ABNORMAL LOW (ref 96–112)
GFR calc Af Amer: >90 mL/min (ref >90)
GFR calc Af Amer: >90 mL/min (ref >90)
GFR calc non Af Amer: 82 mL/min — ABNORMAL LOW (ref >90)
Glucose, Bld: 114 mg/dL — ABNORMAL HIGH (ref 70–99)
Potassium: 3.6 mEq/L (ref 3.5–5.1)
Potassium: 3.6 mEq/L (ref 3.5–5.1)
Sodium: 128 mEq/L — ABNORMAL LOW (ref 135–145)

## 2012-02-12 MED ORDER — LEVOTHYROXINE SODIUM 50 MCG PO TABS
50.0000 ug | ORAL_TABLET | Freq: Every day | ORAL | Status: DC
  Administered 2012-02-13 – 2012-02-14 (×2): 50 ug via ORAL
  Filled 2012-02-12 (×3): qty 1

## 2012-02-12 NOTE — Progress Notes (Signed)
[facility] PROGRESS NOTE  Amy Rodriguez OZH:[id number] DOB: 1933-11-25 DOA: 02/08/2012 PCP: Delbert Harness, MD  Assessment/Plan: Principal Problem:  *Left knee DJD Active Problems:  Hyponatremia     1. Hyponatremia: Sodium at 128, probably a combination of post op, dehydration, medications like HCTZ, pain etc. We will have to rule out SIADH. Patient did not respond to fluid restriction. Patient appears slightly dehydrated and with blood pressures running soft. TSH is elevated. We will do a trial of fluids for 24 hours and monitor electrolytes. Patient is currently symptomatic with nausea, she is not confused or altered.  2. Left Knee DJD: s/p surgery and followed orthopedics.  3. hypothyroidism will start the patient on 50 mcg of levothyroxine  I will followup again tomorrow. Please contact me if I can be of assistance in the meanwhile. Thank you for this consultation. Probable discharge tomorrow  Chief Complaint: left knee pain  HPI:  78 year old lady with multiple medical problems came in for severe left knee pain secondary to left DJD. She underwent total knee arthoplasty on the left. She was found to be hyponatremic which was not responding to fluid restrction and HCTZ, and we were consulted.     Objective: Filed Vitals:   02/11/12 0800 02/11/12 1509 02/11/12 2038 02/12/12 0624  BP:  127/58 135/60 128/78  Pulse:  56 80 76  Temp:  97.9 F (36.6 C) 98.4 F (36.9 C) 98 F (36.7 C)  TempSrc:  Oral Oral Oral  Resp: 18 18 16 16   SpO2:  98% 98% 97%    Intake/Output Summary (Last 24 hours) at 02/12/12 1033 Last data filed at 02/11/12 2037  Gross per 24 hour  Intake    241 ml  Output      0 ml  Net    241 ml    Exam:  HENT:  Head: Atraumatic.  Nose: Nose normal.  Mouth/Throat: Oropharynx is clear and moist.  Eyes: Conjunctivae are normal. Pupils are equal, round, and reactive to light. No scleral icterus.  Neck: Neck supple. No tracheal deviation present.    Cardiovascular: Normal rate, regular rhythm, normal heart sounds and intact distal pulses.  Pulmonary/Chest: Effort normal and breath sounds normal. No respiratory distress.  Abdominal: Soft. Normal appearance and bowel sounds are normal. She exhibits no distension. There is no tenderness.  Musculoskeletal: She exhibits no edema and no tenderness.  Neurological: She is alert. No cranial nerve deficit.    Data Reviewed: Basic Metabolic Panel:  Lab 02/12/12 6295 02/11/12 2024 02/11/12 0635 02/10/12 1456 02/10/12 0458  NA 128* 119* 115* 116* 120*  K 3.6 4.3 3.9 4.1 4.1  CL 92* 83* 80* 83* 85*  CO2 28 25 25 27 26   GLUCOSE 114* 129* 120* 124* 120*  BUN 9 10 10 9 9   CREATININE 0.71 0.71 0.74 0.67 0.68  CALCIUM 8.3* 8.7 8.1* 7.8* 8.1*  MG -- -- -- -- --  PHOS -- -- -- -- --    Liver Function Tests: No results found for this basename: AST:5,ALT:5,ALKPHOS:5,BILITOT:5,PROT:5,ALBUMIN:5 in the last 168 hours No results found for this basename: LIPASE:5,AMYLASE:5 in the last 168 hours No results found for this basename: AMMONIA:5 in the last 168 hours  CBC:  Lab 02/11/12 0635 02/10/12 0458 02/09/12 0530 02/06/12 1555  WBC 13.4* 11.2* 10.5 7.5  NEUTROABS -- -- -- 3.8  HGB 9.4* 9.9* 10.9* 14.2  HCT 26.1* 28.2* 31.9* 39.4  MCV 79.1 80.8 83.5 83.3  PLT 166 149* 168 191  Cardiac Enzymes: No results found for this basename: CKTOTAL:5,CKMB:5,CKMBINDEX:5,TROPONINI:5 in the last 168 hours BNP (last 3 results) No results found for this basename: PROBNP:3 in the last 8760 hours   CBG:  Lab 02/08/12 2221  GLUCAP 146*    Recent Results (from the past 240 hour(s))  SURGICAL PCR SCREEN     Status: Abnormal   Collection Time   02/06/12  3:55 PM      Component Value Range Status Comment   MRSA, PCR NEGATIVE  NEGATIVE Final    Staphylococcus aureus POSITIVE (*) NEGATIVE Final      Studies: Dg Chest 2 View  02/06/2012  *RADIOLOGY REPORT*  Clinical Data: Preop for left knee  replacement  CHEST - 2 VIEW  Comparison: 09/20/2010  Findings: Cardiomediastinal silhouette is stable.  Mild hyperinflation again noted.  No acute infiltrate or pleural effusion.  No pulmonary edema.  IMPRESSION: No active disease.  No significant change.   Original Report Authenticated By: Natasha Mead, M.D.     Scheduled Meds:   . aspirin EC  325 mg Oral BID  . atenolol  25 mg Oral Daily  . Chlorhexidine Gluconate Cloth  6 each Topical Daily  . docusate sodium  100 mg Oral BID  . levothyroxine  50 mcg Oral QAC breakfast  . loratadine  10 mg Oral Daily  . mupirocin ointment  1 application Nasal BID  . pantoprazole  40 mg Oral Daily  . simvastatin  20 mg Oral QPM   Continuous Infusions:   . sodium chloride 50 mL/hr at 02/11/12 2245  . DISCONTD: sodium chloride 10 mL/hr (02/10/12 1441)  . DISCONTD: sodium chloride 50 mL/hr (02/11/12 1030)    Principal Problem:  *Left knee DJD Active Problems:  Hyponatremia    Time spent: 40 minutes   [facility]  [facility] Pager [phone]. If 8PM-8AM, please contact night-coverage at [email or web address], password [facility] 02/12/2012, 10:33 AM  LOS: 4 days

## 2012-02-12 NOTE — Progress Notes (Signed)
Physical Therapy Treatment Patient Details Name: Amy Rodriguez MRN: [id number] DOB: [birth year] Today's Date: 02/12/2012 Time: 0950-1016 PT Time Calculation (min): 26 min  PT Assessment / Plan / Recommendation Comments on Treatment Session  Pt continues to progress towards PT goals. Pt was able to ambulate increased distance with minimal rest breaks.  Pt also able to perform multiple transfers from varying surfaces.  Pt c/o increased pain with ambulation and activity and mild light-headedness she attributes to the pain.  The light-headedness resolved after rest. will continue to monitor and progress activity as tolerated.    Follow Up Recommendations  Post acute inpatient     Does the patient have the potential to tolerate intense rehabilitation  No, Recommend SNF  Barriers to Discharge        Equipment Recommendations  None recommended by OT;None recommended by PT    Recommendations for Other Services    Frequency 7X/week   Plan Discharge plan remains appropriate;Frequency remains appropriate    Precautions / Restrictions Precautions Precautions: Knee Restrictions LLE Weight Bearing: Weight bearing as tolerated   Pertinent Vitals/Pain 6/10    Mobility  Transfers Transfers: Sit to Stand;Stand to Sit Sit to Stand: 5: Supervision;With upper extremity assist;From toilet;From chair/3-in-1 Stand to Sit: 5: Supervision;With armrests;To chair/3-in-1;To toilet Details for Transfer Assistance: VC's for hand placement to sit, VCs to use rail during toilet transfer Ambulation/Gait Ambulation/Gait Assistance: 4: Min guard;5: Supervision Ambulation Distance (Feet): 60 Feet Assistive device: Rolling walker Ambulation/Gait Assistance Details: Rest breaks required secondary to light headedness.  After a few minutes symptoms resolved Gait Pattern: Step-to pattern;Decreased weight shift to left;Left flexed knee in stance;Antalgic;Trunk flexed;Step-through pattern Gait velocity:  decreased General Gait Details: VC's for emerging step through gait Stairs: No Wheelchair Mobility Wheelchair Mobility: No    Exercises Total Joint Exercises Ankle Circles/Pumps: AROM;Both;20 reps    PT Goals Acute Rehab PT Goals Time For Goal Achievement: 02/16/12 Potential to Achieve Goals: Good Pt will go Sit to Stand: with modified independence Pt will go Stand to Sit: with modified independence PT Goal: Stand to Sit - Progress: Progressing toward goal Pt will Transfer Bed to Chair/Chair to Bed: with modified independence Pt will Ambulate: >150 feet PT Goal: Ambulate - Progress: Progressing toward goal Pt will Go Up / Down Stairs: 3-5 stairs;with min assist Pt will Perform Home Exercise Program: Independently PT Goal: Perform Home Exercise Program - Progress: Progressing toward goal  Visit Information  Last PT Received On: 02/12/12 Assistance Needed: +1    Subjective Data  Subjective: They are having a hard time with my meds Patient Stated Goal: To take care of herself.   Cognition  Overall Cognitive Status: Appears within functional limits for tasks assessed/performed Arousal/Alertness: Awake/alert Orientation Level: Appears intact for tasks assessed Behavior During Session: [facility] for tasks performed    Balance  Balance Balance Assessed: Yes Static Standing Balance Static Standing - Balance Support: Bilateral upper extremity supported Static Standing - Level of Assistance: 6: Modified independent (Device/Increase time) (at sink to perform hygiene) High Level Balance High Level Balance Activites: Side stepping;Backward walking;Direction changes;Turns;Head turns High Level Balance Comments: pt steady with balance activities  End of Session PT - End of Session Equipment Utilized During Treatment: Gait belt Activity Tolerance: Patient tolerated treatment well;Patient limited by pain Patient left: in chair;with call bell/phone within reach;with nursing in room Nurse  Communication: Mobility status;Other (comment) (pain & nausea)   GP     Fabio Asa 02/12/2012, 10:24 AM Charlotte Crumb, PT DPT  [phone]    

## 2012-02-12 NOTE — Progress Notes (Signed)
Subjective: 4 Days Post-Op Procedure(s) (LRB): TOTAL KNEE ARTHROPLASTY (Left)  Activity level:  Up with therapy Diet tolerance:  Less n/v  - feels some better Voiding:  ok Patient reports pain as 3 on 0-10 scale.    Objective: Vital signs in last 24 hours: Temp:  [97.9 F (36.6 C)-98.4 F (36.9 C)] 98 F (36.7 C) (10/28 0624) Pulse Rate:  [56-80] 76  (10/28 0624) Resp:  [16-18] 16  (10/28 0624) BP: (127-135)/(58-78) 128/78 mmHg (10/28 0624) SpO2:  [97 %-98 %] 97 % (10/28 0624)  Labs:  Basename 02/11/12 0635 02/10/12 0458  HGB 9.4* 9.9*    Basename 02/11/12 0635 02/10/12 0458  WBC 13.4* 11.2*  RBC 3.30* 3.49*  HCT 26.1* 28.2*  PLT 166 149*    Basename 02/12/12 0655 02/11/12 2024  NA 128* 119*  K 3.6 4.3  CL 92* 83*  CO2 28 25  BUN 9 10  CREATININE 0.71 0.71  GLUCOSE 114* 129*  CALCIUM 8.3* 8.7   No results found for this basename: LABPT:2,INR:2 in the last 72 hours  Physical Exam:  Neurologically intact ABD soft Neurovascular intact Sensation intact distally Intact pulses distally Dorsiflexion/Plantar flexion intact Incision: dressing C/D/I No cellulitis present Compartment soft  Assessment/Plan: Hyponatremia  4 Days Post-Op Procedure(s) (LRB): TOTAL KNEE ARTHROPLASTY (Left) Advance diet Up with therapy Discharge to SNF when ok with med consult -  Na  128 and TSH elevated .   Patient says PCP stopped thyroid med a few months ago. Cont IV Thanks to Dr.Akula    Providence Stivers R 02/12/2012, 9:40 AM

## 2012-02-12 NOTE — Progress Notes (Addendum)
Patient was not d/c'd to SNFover the weekend due to medical issues and is not stable medically today either for d/c.  Notified [facility] and [facility] Medicare Case Manager of above. Will have to seek new authorization when patient is medically stable for d/c. Will monitor for tentative date of stability.   Lorri Frederick. [place]  [phone]

## 2012-02-13 DIAGNOSIS — E039 Hypothyroidism, unspecified: Secondary | ICD-10-CM | POA: Clinically undetermined

## 2012-02-13 DIAGNOSIS — M79609 Pain in unspecified limb: Secondary | ICD-10-CM

## 2012-02-13 LAB — BASIC METABOLIC PANEL
CO2: 26 mEq/L (ref 19–32)
Chloride: 100 mEq/L (ref 96–112)
Chloride: 99 mEq/L (ref 96–112)
Creatinine, Ser: 0.67 mg/dL (ref 0.50–1.10)
GFR calc Af Amer: >90 mL/min (ref >90)
Potassium: 3.2 mEq/L — ABNORMAL LOW (ref 3.5–5.1)
Sodium: 136 mEq/L (ref 135–145)
Sodium: 134 mEq/L — ABNORMAL LOW (ref 135–145)

## 2012-02-13 MED ORDER — LEVOTHYROXINE SODIUM 50 MCG PO TABS: 50.0000 ug | ORAL_TABLET | Freq: Every day | ORAL | Status: DC

## 2012-02-13 MED ORDER — FLEET ENEMA 7-19 GM/118ML RE ENEM
1.0000 | ENEMA | Freq: Once | RECTAL | Status: AC
  Administered 2012-02-13: 1 via RECTAL

## 2012-02-13 MED ORDER — BISACODYL 10 MG RE SUPP
10.0000 mg | Freq: Once | RECTAL | Status: AC
  Administered 2012-02-13: 10 mg via RECTAL

## 2012-02-13 MED ORDER — POTASSIUM CHLORIDE CRYS ER 20 MEQ PO TBCR
40.0000 meq | EXTENDED_RELEASE_TABLET | Freq: Once | ORAL | Status: AC
  Administered 2012-02-13: 40 meq via ORAL
  Filled 2012-02-13: qty 2

## 2012-02-13 NOTE — Progress Notes (Signed)
Physical Therapy Treatment Patient Details Name: Amy Rodriguez MRN: [id number] DOB: 05-16-1933 Today's Date: 02/13/2012 Time: 7829-5621 PT Time Calculation (min): 24 min  PT Assessment / Plan / Recommendation Comments on Treatment Session  Pt mobility limited by calf pain. Pt resents with increasing pain in posterior lower leg with palpation of calf and ankle DF. Pt presents with positive Homan's sign in L LE.   Attempted to contact MD/PA.  Both in surgery.  Left message for MD to call RN about PT findings. PT would benefit from Venous doppler study to r/o DVT in L LE.      Follow Up Recommendations  Post acute inpatient     Does the patient have the potential to tolerate intense rehabilitation  No, Recommend SNF  Barriers to Discharge        Equipment Recommendations  None recommended by PT    Recommendations for Other Services    Frequency 7X/week   Plan Discharge plan remains appropriate;Frequency remains appropriate      Precautions / Restrictions Precautions Precautions: Knee Restrictions Weight Bearing Restrictions: Yes LLE Weight Bearing: Weight bearing as tolerated    Pertinent Vitals/Pain Pt c/o 8/10 pain in L calf.   Pt presents with tenderness to palpation of Calf and positive Homan's sign.  MD and RN notified.       Mobility  Bed Mobility Bed Mobility: Supine to Sit;Sitting - Scoot to Edge of Bed Supine to Sit: 5: Supervision Sitting - Scoot to Edge of Bed: 5: Supervision Transfers Transfers: Sit to Stand;Stand to Sit Sit to Stand: 5: Supervision Ambulation/Gait Ambulation/Gait Assistance: 3: Mod assist Ambulation Distance (Feet): 5 Feet Assistive device: Rolling walker Ambulation/Gait Assistance Details: Pt unable to tolerate WB on Heel of LLE.  Pt c/o severe pain in L calf with L ankle DF and weight bearing on L LE.  Discontinued gait training secondary to pt c/o worsening pain.   Gait Pattern: Step-to pattern;Decreased weight shift to left;Left flexed  knee in stance;Antalgic;Trunk flexed;Step-through pattern Gait velocity: decreased Stairs: No Wheelchair Mobility Wheelchair Mobility: No    Exercises     PT Diagnosis:    PT Problem List:   PT Treatment Interventions:     PT Goals Acute Rehab PT Goals PT Goal Formulation: With patient Time For Goal Achievement: 02/16/12 Potential to Achieve Goals: Good Pt will go Supine/Side to Sit: with modified independence PT Goal: Supine/Side to Sit - Progress: Progressing toward goal Pt will go Sit to Stand: with modified independence PT Goal: Sit to Stand - Progress: Progressing toward goal Pt will go Stand to Sit: with modified independence PT Goal: Stand to Sit - Progress: Progressing toward goal Pt will Transfer Bed to Chair/Chair to Bed: with modified independence PT Transfer Goal: Bed to Chair/Chair to Bed - Progress: Progressing toward goal Pt will Ambulate: >150 feet PT Goal: Ambulate - Progress: Not met Pt will Go Up / Down Stairs: 3-5 stairs;with min assist PT Goal: Up/Down Stairs - Progress: Not met Pt will Perform Home Exercise Program: Independently PT Goal: Perform Home Exercise Program - Progress: Not met  Visit Information  Last PT Received On: 02/13/12 Assistance Needed: +1    Subjective Data  Subjective: I feel fine when I am lying down.  Patient Stated Goal: To take care of herself.   Cognition  Overall Cognitive Status: Appears within functional limits for tasks assessed/performed Arousal/Alertness: Awake/alert Orientation Level: Appears intact for tasks assessed Behavior During Session: [facility] for tasks performed    Balance  End of Session PT - End of Session Equipment Utilized During Treatment: Gait belt Activity Tolerance: Patient limited by pain Patient left: in chair;with call bell/phone within reach;with nursing in room Nurse Communication: Mobility status;Other (comment) (Pt with calf pain )   GP     Wrenley Sayed 02/13/2012, 11:00 AM Nikeya Maxim L.  Nicandro Perrault DPT [phone]

## 2012-02-13 NOTE — Progress Notes (Signed)
Per MD- patient is medically stable for SNF d/c.  DC delay today was due to patient requiring evaluation for possible DVT and needing to have an bowel movement. Once these issues were resolved- attempts were made to send patient to [facility] but facility stated that is it too late in the day and they already have multiple late admissions. They can accept patient in the a.m.  Patient,  Kathlene November- PA and pt's nurse- Ivar Drape were notified of above. Plan d/c to SNF in the a.m.   Lorri Frederick. [place]  [phone]

## 2012-02-13 NOTE — Progress Notes (Signed)
Utilization review completed. [place]fle[place] Capers, RN, BSN. 

## 2012-02-13 NOTE — Progress Notes (Signed)
*  PRELIMINARY RESULTS* Vascular Ultrasound Left lower extremity venous duplex has been completed.  Preliminary findings: Left:  No evidence of DVT, superficial thrombosis, or Baker's cyst. There appears to be a hematoma measuring 3.5cm in the proximal calf.   Farrel Demark, RDMS, RVT 02/13/2012, 12:51 PM

## 2012-02-13 NOTE — Progress Notes (Signed)
Physical Therapy Treatment Patient Details Name: Amy Rodriguez MRN: [id number] DOB: 22-May-1933 Today's Date: 02/13/2012 Time: 6578-4696 PT Time Calculation (min): 15 min  PT Assessment / Plan / Recommendation Comments on Treatment Session  Pt contiues to c/o calf pain. RN reports doppler of calf negative for DVT pt has a Hematoma in the  L calf per RN.  Applied ice pack to calf for pain relief.  Pt should d/c to SNF tomorrowl.      Follow Up Recommendations  Post acute inpatient     Does the patient have the potential to tolerate intense rehabilitation  No, Recommend SNF  Barriers to Discharge        Equipment Recommendations  None recommended by PT    Recommendations for Other Services    Frequency 7X/week   Plan Discharge plan remains appropriate;Frequency remains appropriate    Precautions / Restrictions Precautions Precautions: Knee Restrictions Weight Bearing Restrictions: Yes LLE Weight Bearing: Weight bearing as tolerated   Pertinent Vitals/Pain 8/10 pain in calf with ankle DF.  No pain in CPM machine.      Mobility  Bed Mobility Bed Mobility: Not assessed Transfers Transfers: Not assessed Ambulation/Gait Ambulation/Gait Assistance: Not tested (comment)    Exercises Total Joint Exercises Ankle Circles/Pumps: AROM;Both;20 reps Quad Sets: AROM;Both;15 reps Short Arc QuadBarbaraann Boys;Left;15 reps Heel Slides: AAROM;Left;15 reps Hip ABduction/ADduction: AAROM;Left;15 reps Straight Leg Raises: AAROM;Left;15 reps   PT Diagnosis:    PT Problem List:   PT Treatment Interventions:     PT Goals Acute Rehab PT Goals PT Goal Formulation: With patient Time For Goal Achievement: 02/16/12 Potential to Achieve Goals: Good Pt will go Supine/Side to Sit: with modified independence Pt will Perform Home Exercise Program: Independently PT Goal: Perform Home Exercise Program - Progress: Progressing toward goal  Visit Information  Last PT Received On: 02/13/12      Subjective Data  Subjective: my calf still hurts.   Patient Stated Goal: To take care of herself.   Cognition  Overall Cognitive Status: Appears within functional limits for tasks assessed/performed Arousal/Alertness: Awake/alert Orientation Level: Appears intact for tasks assessed Behavior During Session: [facility] for tasks performed    Balance     End of Session PT - End of Session Activity Tolerance: Patient limited by pain Patient left: in bed;in CPM;with call bell/phone within reach Nurse Communication: Mobility status;Other (comment)   GP     Zan Orlick 02/13/2012, 6:47 PM Lamount Bankson L. Jolleen Seman DPT [phone]

## 2012-02-13 NOTE — Discharge Summary (Signed)
[name] MRN: [id number] DOB/AGE: 23-Oct-1933 78 y.o.  Admit date: 02/08/2012 Discharge date: 02/13/2012  Admission Diagnoses:  Principal Problem:  *Left knee DJD Active Problems:  Hypothyroidism  Hyponatremia   Discharge Diagnoses:  Same  Past Medical History  Diagnosis Date  . Palpitations   . Supraventricular tachycardia, paroxysmal   . HTN (hypertension)   . Hyperlipidemia   . Postmenopausal HRT (hormone replacement therapy)   . URI (upper respiratory infection)   . Pharyngitis   . Other seborrheic keratosis   . Breast screening, unspecified   . Vaginal pruritus   . Urinary frequency   . Diverticulosis of colon   . Blood in stool   . Pain in joint, lower leg   . Osteopenia   . IBS (irritable bowel syndrome)   . Hypothyroidism     TSH 14.488 (11/2005)  . H pylori ulcer     not treated due to expense  10/2001  . PPD positive     history +PPD 1984, no treatment, no abnormal CXR  . Posterior vitreous detachment 1996  . Multiple pigmented nevi     last derm evaluation 01/2003  . Postmenopausal     s/p hysterectomy for h/o cervical cancer 1982 (both ovaries taken at that time) now on hormonal replacement   . Shortness of breath     with ambulation  . Environmental allergies   . Lactose intolerance   . GERD (gastroesophageal reflux disease)   . Hand dermatitis   . Cervical cancer     s/p hysterectomy/oop  . PONV (postoperative nausea and vomiting)     "and takes me a long time to come out under it" (02/08/2012)  . Pneumonia     "couple times in my lifetime" (02/08/2012)  . History of bronchitis     "w/a cold" (02/08/2012)  . Migraines     "get them very rarely now" (02/08/2012)  . OA (osteoarthritis)     multiple sites  . Arthritis     sed rate 10, RF, CCP pending  . DDD (degenerative disc disease), lumbosacral   . Osteoporosis     "borderline" (02/08/2012)  . Depression     Due to husbands passing away 09/22/2011    Surgeries:  Procedure(s): TOTAL KNEE ARTHROPLASTY on 02/08/2012   Consultants:   [facility] for management of hyponatremia and hypothyroidism  Discharged Condition: Improved  Hospital Course: Amy Rodriguez is an 78 y.o. female who was admitted 02/08/2012 for operative treatment ofLeft knee DJD. Patient has severe unremitting pain that affects sleep, daily activities, and work/hobbies. After pre-op clearance the patient was taken to the operating room on 02/08/2012 and underwent  Procedure(s): TOTAL KNEE ARTHROPLASTY.  During her hospital stay she experienced hyponatremia and hypothyroidism, a consult by the [facility] hospitalist was obtained for management of these 2 issues. She was fluid restricted and IVs were managed to correct her hyponatremia. She was also started on thyroid replacement to manage her hypothyroidism.  Patient was given perioperative antibiotics: Anti-infectives     Start     Dose/Rate Route Frequency Ordered Stop   02/08/12 1815   ceFAZolin (ANCEF) IVPB 2 g/50 mL premix        2 g 100 mL/hr over 30 Minutes Intravenous 4 times per day 02/08/12 1643 02/09/12 0104   02/08/12 1319   cefUROXime (ZINACEF) injection  Status:  Discontinued          As needed 02/08/12 1320 02/08/12 1451   02/07/12 1444  ceFAZolin (ANCEF) IVPB 2 g/50 mL premix        2 g 100 mL/hr over 30 Minutes Intravenous 60 min pre-op 02/07/12 1444 02/08/12 1254           Patient was given sequential compression devices, early ambulation, and chemoprophylaxis to prevent DVT. This patient was complaining of calf pain on her day of discharge and a venous Doppler was done and was negative.  Patient benefited maximally from hospital stay and there were no complications.    Recent vital signs: Patient Vitals for the past 24 hrs:  BP Temp Temp src Pulse Resp SpO2  02/13/12 1328 143/59 mmHg 98.3 F (36.8 C) - 67  18  98 %  02/13/12 0613 135/59 mmHg 98 F (36.7 C) Oral 70  20  95 %  Feb 17, 2012 2140 136/57 mmHg  98.7 F (37.1 C) Oral 74  18  95 %     Recent laboratory studies:  Basename 02/13/12 0605 02-17-12 1732 02/11/12 0635  WBC -- -- 13.4*  HGB -- -- 9.4*  HCT -- -- 26.1*  PLT -- -- 166  NA 136 129* --  K 3.2* 3.6 --  CL 100 95* --  CO2 26 27 --  BUN 9 11 --  CREATININE 0.61 0.76 --  GLUCOSE 105* 117* --  INR -- -- --  CALCIUM 7.7* -- --     Discharge Medications:     Medication List     As of 02/13/2012  3:21 PM    STOP taking these medications         hydrochlorothiazide 25 MG tablet   Commonly known as: HYDRODIURIL      TAKE these medications         aspirin 325 MG EC tablet   Take 1 tablet (325 mg total) by mouth 2 (two) times daily.      atenolol 25 MG tablet   Commonly known as: TENORMIN   Take 25 mg by mouth daily.      fexofenadine 180 MG tablet   Commonly known as: ALLEGRA   Take 180 mg by mouth daily as needed. Allergies      fluticasone 50 MCG/ACT nasal spray   Commonly known as: FLONASE   Place 2 sprays into the nose as needed. Allergies      levothyroxine 50 MCG tablet   Commonly known as: SYNTHROID, LEVOTHROID   Take 1 tablet (50 mcg total) by mouth daily before breakfast.      omeprazole 10 MG capsule   Commonly known as: PRILOSEC   Take 10 mg by mouth daily.      oxyCODONE 5 MG immediate release tablet   Commonly known as: Oxy IR/ROXICODONE   Take 1-2 tablets (5-10 mg total) by mouth every 3 (three) hours as needed.      simvastatin 20 MG tablet   Commonly known as: ZOCOR   Take 20 mg by mouth every evening.        Diagnostic Studies: Dg Chest 2 View  02/06/2012  *RADIOLOGY REPORT*  Clinical Data: Preop for left knee replacement  CHEST - 2 VIEW  Comparison: 09/20/2010  Findings: Cardiomediastinal silhouette is stable.  Mild hyperinflation again noted.  No acute infiltrate or pleural effusion.  No pulmonary edema.  IMPRESSION: No active disease.  No significant change.   Original Report Authenticated By: Natasha Mead, M.D.      Disposition:       Discharge Orders    Future Orders Please Complete By Expires  Diet - low sodium heart healthy      Call MD / Call 911      Comments:   If you experience chest pain or shortness of breath, CALL 911 and be transported to the hospital emergency room.  If you develope a fever above 101 F, pus (white drainage) or increased drainage or redness at the wound, or calf pain, call your surgeon's office.   Constipation Prevention      Comments:   Drink plenty of fluids.  Prune juice may be helpful.  You may use a stool softener, such as Colace (over the counter) 100 mg twice a day.  Use MiraLax (over the counter) for constipation as needed.   Increase activity slowly as tolerated        she will need a TSH drawn by her family doctor in 6 weeks. Dr. Earnest Bailey She will also continue on ASA 325 one twice a day for a total of 2 weeks from her surgical date.  Follow-up Information    Follow up with DALLDORF,PETER G, MD. Call in 2 weeks.   Contact information:   [address] [place] [address] [phone]           Signed: Prince Rome 02/13/2012, 3:21 PM

## 2012-02-13 NOTE — Progress Notes (Addendum)
Subjective: 5 Days Post-Op Procedure(s) (LRB): TOTAL KNEE ARTHROPLASTY (Left)  Activity level:  Out of bed with physical therapy Diet tolerance:  ok Voiding:  ok Patient reports pain as 2 on 0-10 scale.    Objective: Vital signs in last 24 hours: Temp:  [98 F (36.7 C)-98.7 F (37.1 C)] 98.3 F (36.8 C) (10/29 1328) Pulse Rate:  [67-74] 67  (10/29 1328) Resp:  [18-20] 18  (10/29 1328) BP: (135-143)/(57-59) 143/59 mmHg (10/29 1328) SpO2:  [95 %-98 %] 98 % (10/29 1328)  Labs:  [facility] 02/11/12 0635  HGB 9.4*    Basename 02/11/12 0635  WBC 13.4*  RBC 3.30*  HCT 26.1*  PLT 166    Basename 02/13/12 0605 02/12/12 1732  NA 136 129*  K 3.2* 3.6  CL 100 95*  CO2 26 27  BUN 9 11  CREATININE 0.61 0.76  GLUCOSE 105* 117*  CALCIUM 7.7* 8.0*   No results found for this basename: LABPT:2,INR:2 in the last 72 hours  Physical Exam:  Neurologically intact ABD soft Neurovascular intact Sensation intact distally Intact pulses distally Dorsiflexion/Plantar flexion intact No cellulitis present Compartment soft Patient complaining of calf pain with some calf tenderness but negative Homans.  Assessment/Plan:  5 Days Post-Op Procedure(s) (LRB): TOTAL KNEE ARTHROPLASTY (Left) Advance diet Up with therapy D/C IV fluids Discharge to SNF Continue Synthroid with a TSH to be rechecked in 6 weeks by family doctor and it Discontinue diuretic Okay to be discharged to a skilled nursing facility today Return to office in 2 weeks. Prescriptions for pain medication and ASA 325 one twice a day for 2 weeks given to patient Venous Doppler completed prior to discharge and was negative for DVT    [place] Karl R 02/13/2012, 3:32 PM

## 2012-02-13 NOTE — Progress Notes (Signed)
[facility] PROGRESS NOTE  Amy Rodriguez:[id number] DOB: 17-Oct-1933 DOA: 02/08/2012 PCP: Delbert Harness, MD  Assessment/Plan: Principal Problem:  *Left knee DJD Active Problems:  Hyponatremia     Hyponatremia resolved, multifactorial secondary dehydration versus hypothyroidism versus HCTZ, discontinue HCTZ,    Hypothyroidism, started patient on Synthroid, continue supplementation, TSH in 6 weeks I primary care provider    Hypertension continue to hold HCTZ, blood pressure has been stable on atenolol, will provide prescription for this    Discharge planning stable for discharge today  HPI/Subjective: Stable  Objective: Filed Vitals:   02/12/12 0624 02/12/12 1305 02/12/12 2140 02/13/12 0613  BP: 128/78 123/53 136/57 135/59  Pulse: 76 68 74 70  Temp: 98 F (36.7 C) 98.2 F (36.8 C) 98.7 F (37.1 C) 98 F (36.7 C)  TempSrc: Oral  Oral Oral  Resp: 16 18 18 20   SpO2: 97% 99% 95% 95%    Intake/Output Summary (Last 24 hours) at 02/13/12 0921 Last data filed at 02/13/12 0600  Gross per 24 hour  Intake    240 ml  Output    400 ml  Net   -160 ml    Exam:  HENT:  Head: Atraumatic.  Nose: Nose normal.  Mouth/Throat: Oropharynx is clear and moist.  Eyes: Conjunctivae are normal. Pupils are equal, round, and reactive to light. No scleral icterus.  Neck: Neck supple. No tracheal deviation present.  Cardiovascular: Normal rate, regular rhythm, normal heart sounds and intact distal pulses.  Pulmonary/Chest: Effort normal and breath sounds normal. No respiratory distress.  Abdominal: Soft. Normal appearance and bowel sounds are normal. She exhibits no distension. There is no tenderness.  Musculoskeletal: She exhibits no edema and no tenderness.  Neurological: She is alert. No cranial nerve deficit.    Data Reviewed: Basic Metabolic Panel:  Lab 02/13/12 8119 02/12/12 1732 02/12/12 1478 02/11/12 2024 02/11/12 0635  NA 136 129* 128* 119* 115*  K 3.2* 3.6  3.6 4.3 3.9  CL 100 95* 92* 83* 80*  CO2 26 27 28 25 25   GLUCOSE 105* 117* 114* 129* 120*  BUN 9 11 9 10 10   CREATININE 0.61 0.76 0.71 0.71 0.74  CALCIUM 7.7* 8.0* 8.3* 8.7 8.1*  MG -- -- -- -- --  PHOS -- -- -- -- --    Liver Function Tests: No results found for this basename: AST:5,ALT:5,ALKPHOS:5,BILITOT:5,PROT:5,ALBUMIN:5 in the last 168 hours No results found for this basename: LIPASE:5,AMYLASE:5 in the last 168 hours No results found for this basename: AMMONIA:5 in the last 168 hours  CBC:  Lab 02/11/12 0635 02/10/12 0458 02/09/12 0530 02/06/12 1555  WBC 13.4* 11.2* 10.5 7.5  NEUTROABS -- -- -- 3.8  HGB 9.4* 9.9* 10.9* 14.2  HCT 26.1* 28.2* 31.9* 39.4  MCV 79.1 80.8 83.5 83.3  PLT 166 149* 168 191    Cardiac Enzymes: No results found for this basename: CKTOTAL:5,CKMB:5,CKMBINDEX:5,TROPONINI:5 in the last 168 hours BNP (last 3 results) No results found for this basename: PROBNP:3 in the last 8760 hours   CBG:  Lab 02/08/12 2221  GLUCAP 146*    Recent Results (from the past 240 hour(s))  SURGICAL PCR SCREEN     Status: Abnormal   Collection Time   02/06/12  3:55 PM      Component Value Range Status Comment   MRSA, PCR NEGATIVE  NEGATIVE Final    Staphylococcus aureus POSITIVE (*) NEGATIVE Final      Studies: Dg Chest 2 View  02/06/2012  *RADIOLOGY REPORT*  Clinical  Data: Preop for left knee replacement  CHEST - 2 VIEW  Comparison: 09/20/2010  Findings: Cardiomediastinal silhouette is stable.  Mild hyperinflation again noted.  No acute infiltrate or pleural effusion.  No pulmonary edema.  IMPRESSION: No active disease.  No significant change.   Original Report Authenticated By: Natasha Mead, M.D.     Scheduled Meds:   . aspirin EC  325 mg Oral BID  . atenolol  25 mg Oral Daily  . Chlorhexidine Gluconate Cloth  6 each Topical Daily  . docusate sodium  100 mg Oral BID  . levothyroxine  50 mcg Oral QAC breakfast  . loratadine  10 mg Oral Daily  . mupirocin  ointment  1 application Nasal BID  . pantoprazole  40 mg Oral Daily  . simvastatin  20 mg Oral QPM   Continuous Infusions:   . sodium chloride 75 mL/hr at 02/13/12 0810    Principal Problem:  *Left knee DJD Active Problems:  Hyponatremia    Time spent: 40 minutes   [facility]  [facility] Pager [phone]. If 8PM-8AM, please contact night-coverage at [email or web address], password [facility] 02/13/2012, 9:21 AM  LOS: 5 days

## 2012-02-14 LAB — BASIC METABOLIC PANEL
CO2: 26 mEq/L (ref 19–32)
Calcium: 7.9 mg/dL — ABNORMAL LOW (ref 8.4–10.5)
GFR calc non Af Amer: 85 mL/min — ABNORMAL LOW (ref >90)
Sodium: 129 mEq/L — ABNORMAL LOW (ref 135–145)

## 2012-02-14 NOTE — Progress Notes (Signed)
No significant change in this patient's condition from the discharge summary 02/13/12. She is ready for discharge to skilled nursing facility today.

## 2012-02-14 NOTE — Progress Notes (Signed)
Physical Therapy Treatment Patient Details Name: Amy Rodriguez MRN: [id number] DOB: 11/14/1933 Today's Date: 02/14/2012 Time: 4696-2952 PT Time Calculation (min): 27 min  PT Assessment / Plan / Recommendation Comments on Treatment Session  Patient able to increase ambulation this session and stated leg felt better after walk. Patient is planning to DC to [facility] this morning.     Follow Up Recommendations  Post acute inpatient     Does the patient have the potential to tolerate intense rehabilitation  No, Recommend SNF  Barriers to Discharge        Equipment Recommendations  None recommended by PT    Recommendations for Other Services    Frequency 7X/week   Plan Discharge plan remains appropriate;Frequency remains appropriate    Precautions / Restrictions Precautions Precautions: Knee Precaution Comments: Paitent educated on no pillow under the knee Restrictions LLE Weight Bearing: Weight bearing as tolerated   Pertinent Vitals/Pain 7/10 L knee pain. Patient was premedicated    Mobility  Bed Mobility Bed Mobility: Not assessed Transfers Sit to Stand: 5: Supervision;With upper extremity assist;With armrests;From chair/3-in-1 Stand to Sit: 5: Supervision;With upper extremity assist;With armrests;To chair/3-in-1 Details for Transfer Assistance: x2. Supervision for safety and cues to ensure walker is in front of patient prior to standing Ambulation/Gait Ambulation/Gait Assistance: 4: Min guard Ambulation Distance (Feet): 120 Feet Assistive device: Rolling walker Ambulation/Gait Assistance Details: Cues to heel strike on LLE and progressed with increased gait. Cues for posture amd to stand within RW Gait Pattern: Step-through pattern;Decreased stride length;Trunk flexed Gait velocity: decreased    Exercises Total Joint Exercises Quad Sets: AROM;Left;15 reps Short Arc QuadBarbaraann Boys;Left;15 reps Heel Slides: AAROM;Left;15 reps Hip ABduction/ADduction: AAROM;15  reps;Left Straight Leg Raises: AAROM;Left;15 reps   PT Diagnosis:    PT Problem List:   PT Treatment Interventions:     PT Goals Acute Rehab PT Goals PT Goal: Sit to Stand - Progress: Progressing toward goal PT Goal: Stand to Sit - Progress: Progressing toward goal PT Transfer Goal: Bed to Chair/Chair to Bed - Progress: Progressing toward goal PT Goal: Ambulate - Progress: Progressing toward goal PT Goal: Perform Home Exercise Program - Progress: Progressing toward goal  Visit Information  Last PT Received On: 02/14/12 Assistance Needed: +1    Subjective Data      Cognition  Overall Cognitive Status: Appears within functional limits for tasks assessed/performed Arousal/Alertness: Awake/alert Orientation Level: Appears intact for tasks assessed Behavior During Session: [facility] for tasks performed    Balance     End of Session PT - End of Session Equipment Utilized During Treatment: Gait belt Activity Tolerance: Patient tolerated treatment well Patient left: in chair;with call bell/phone within reach Nurse Communication: Mobility status   GP     Fredrich Birks 02/14/2012, 9:31 AM 02/14/2012 Fredrich Birks PTA [phone] pager [phone] office

## 2012-03-18 ENCOUNTER — Ambulatory Visit (INDEPENDENT_AMBULATORY_CARE_PROVIDER_SITE_OTHER): Payer: Medicare Other | Admitting: Family Medicine

## 2012-03-18 ENCOUNTER — Encounter: Payer: Self-pay | Admitting: Family Medicine

## 2012-03-18 VITALS — BP 138/72 | HR 55 | Temp 97.9°F | Ht 62.0 in | Wt 137.1 lb

## 2012-03-18 DIAGNOSIS — E559 Vitamin D deficiency, unspecified: Secondary | ICD-10-CM

## 2012-03-18 DIAGNOSIS — E039 Hypothyroidism, unspecified: Secondary | ICD-10-CM

## 2012-03-18 DIAGNOSIS — K5900 Constipation, unspecified: Secondary | ICD-10-CM | POA: Insufficient documentation

## 2012-03-18 DIAGNOSIS — M1712 Unilateral primary osteoarthritis, left knee: Secondary | ICD-10-CM

## 2012-03-18 DIAGNOSIS — R5381 Other malaise: Secondary | ICD-10-CM

## 2012-03-18 DIAGNOSIS — IMO0002 Reserved for concepts with insufficient information to code with codable children: Secondary | ICD-10-CM

## 2012-03-18 DIAGNOSIS — R5383 Other fatigue: Secondary | ICD-10-CM | POA: Insufficient documentation

## 2012-03-18 DIAGNOSIS — I10 Essential (primary) hypertension: Secondary | ICD-10-CM

## 2012-03-18 DIAGNOSIS — E871 Hypo-osmolality and hyponatremia: Secondary | ICD-10-CM

## 2012-03-18 DIAGNOSIS — M1710 Unilateral primary osteoarthritis, unspecified knee: Secondary | ICD-10-CM

## 2012-03-18 LAB — CBC WITH DIFFERENTIAL/PLATELET
Basophils Absolute: 0 10*3/uL (ref 0.0–0.1)
Eosinophils Relative: 4 % (ref 0–5)
Lymphocytes Relative: 28 % (ref 12–46)
Neutro Abs: 3.2 10*3/uL (ref 1.7–7.7)
Platelets: 230 10*3/uL (ref 150–400)
RDW: 14.2 % (ref 11.5–15.5)
WBC: 5.8 10*3/uL (ref 4.0–10.5)

## 2012-03-18 LAB — LIPID PANEL
LDL Cholesterol: 83 mg/dL (ref 0–99)
Total CHOL/HDL Ratio: 3.4 Ratio
VLDL: 26 mg/dL (ref 0–40)

## 2012-03-18 LAB — COMPREHENSIVE METABOLIC PANEL
AST: 14 U/L (ref 0–37)
Albumin: 4.3 g/dL (ref 3.5–5.2)
Alkaline Phosphatase: 66 U/L (ref 39–117)
Glucose, Bld: 96 mg/dL (ref 70–99)
Potassium: 4.4 mEq/L (ref 3.5–5.3)
Sodium: 136 mEq/L (ref 135–145)
Total Protein: 7.1 g/dL (ref 6.0–8.3)

## 2012-03-18 LAB — SEDIMENTATION RATE: Sed Rate: 17 mm/hr (ref 0–22)

## 2012-03-18 NOTE — Assessment & Plan Note (Signed)
Well controlled today with home BP readings at goal

## 2012-03-18 NOTE — Assessment & Plan Note (Signed)
Was noted in hospital.  Expect improvement now off hctz.  Will recheck today

## 2012-03-18 NOTE — Assessment & Plan Note (Signed)
Fatigue likely multifactorial with recent surgery, poor appetite, recent loss of husband all playing a factor.  Will check TSH, electrlytes, ESR, CBC, CMET, VIt D  to evaluate for other causes.

## 2012-03-18 NOTE — Progress Notes (Signed)
  Subjective:    [name], female    DOB: 07-16-1933, 78 y.o.   MRN: [id number]  HPIHere to evaluate fatigue  Feels "bad, almost like you had the flu"  Has been feeling like this since Knee replacement almost 6 weeks ago.  Did not feel like this prior to surgery.  Had one week of inpatient rehab, 2 weeks of home pt and is continuing ambulatory PT. Has also been feeling nauseous since that time too.  Sweets and salty taste more intense.  No symptoms of acid reflux.  Has had some weight loss due to poor appetite.  Has struggled since husband passed earlier this year.  Feels like she is managing fine.  Sleep is disrupted some due to  Knee pain.  Reports some constipation related to opioids.  During hospitalization- was found to be hyponatremic and TSH elevated after surgery.  Has been recently started on HCTZ 12.5 and d/c on synthroid.  Patient reports no difference since being back on synthroid.  I have reviewed patient's  PMH, FH, and Social history and Medications as related to this visit.   Review of SystemsNo fevers, chills.  No numbness, weakness.  No dyspnea.  Ros otherwise neg     Objective:   Physical Exam GEN: Alert & Oriented, No acute distress CV:  Regular Rate & Rhythm, no murmur Respiratory:  Normal work of breathing, CTAB Abd:  + BS, soft, no tenderness to palpation Ext: no pre-tibial edema. Knee no signs of infection, well healed knee scar PSych;  Sad affect       Assessment & Plan:

## 2012-03-18 NOTE — Assessment & Plan Note (Signed)
Due to opioids.  Felt like miralax caused too much GI upset.  Recommended colace daily and Senna prn

## 2012-03-18 NOTE — Patient Instructions (Addendum)
Buy Colace 100 mg (Stool softener) and take one tablet daily while you are on pain pills  If you need something to help move your bowels- Buy Senokot  Will check labs- will let you know results  Follow-up in 6 months or sooner if needed

## 2012-03-19 ENCOUNTER — Ambulatory Visit: Payer: Medicare Other | Attending: Orthopaedic Surgery | Admitting: Physical Therapy

## 2012-03-19 ENCOUNTER — Telehealth: Payer: Self-pay | Admitting: Family Medicine

## 2012-03-19 DIAGNOSIS — IMO0001 Reserved for inherently not codable concepts without codable children: Secondary | ICD-10-CM | POA: Insufficient documentation

## 2012-03-19 DIAGNOSIS — M25569 Pain in unspecified knee: Secondary | ICD-10-CM | POA: Insufficient documentation

## 2012-03-19 DIAGNOSIS — Z96659 Presence of unspecified artificial knee joint: Secondary | ICD-10-CM | POA: Insufficient documentation

## 2012-03-19 DIAGNOSIS — R5381 Other malaise: Secondary | ICD-10-CM | POA: Insufficient documentation

## 2012-03-19 DIAGNOSIS — M25669 Stiffness of unspecified knee, not elsewhere classified: Secondary | ICD-10-CM | POA: Insufficient documentation

## 2012-03-19 NOTE — Telephone Encounter (Signed)
Called to discuss normal lab results with patient.   Left message with son as patient is currently in rehab that results are normal and to call back with any questions.

## 2012-03-22 ENCOUNTER — Ambulatory Visit: Payer: Medicare Other | Admitting: *Deleted

## 2012-03-25 ENCOUNTER — Telehealth: Payer: Self-pay | Admitting: Family Medicine

## 2012-03-25 NOTE — Telephone Encounter (Signed)
Patient is calling for the results of her labs °

## 2012-03-25 NOTE — Telephone Encounter (Signed)
Pt informed that per previous note that her results were normal. Amy Rodriguez, Amy Rodriguez

## 2012-03-26 ENCOUNTER — Ambulatory Visit: Payer: Medicare Other | Admitting: Physical Therapy

## 2012-03-28 ENCOUNTER — Ambulatory Visit: Payer: Medicare Other | Admitting: Physical Therapy

## 2012-04-01 ENCOUNTER — Ambulatory Visit: Payer: Medicare Other | Admitting: Physical Therapy

## 2012-04-04 ENCOUNTER — Ambulatory Visit: Payer: Medicare Other | Admitting: Physical Therapy

## 2012-04-12 ENCOUNTER — Encounter: Payer: Medicare Other | Admitting: Physical Therapy

## 2012-04-22 ENCOUNTER — Ambulatory Visit: Payer: Medicare Other | Attending: Orthopaedic Surgery | Admitting: Physical Therapy

## 2012-04-22 DIAGNOSIS — M25669 Stiffness of unspecified knee, not elsewhere classified: Secondary | ICD-10-CM | POA: Insufficient documentation

## 2012-04-22 DIAGNOSIS — IMO0001 Reserved for inherently not codable concepts without codable children: Secondary | ICD-10-CM | POA: Insufficient documentation

## 2012-04-22 DIAGNOSIS — R5381 Other malaise: Secondary | ICD-10-CM | POA: Insufficient documentation

## 2012-04-22 DIAGNOSIS — Z96659 Presence of unspecified artificial knee joint: Secondary | ICD-10-CM | POA: Insufficient documentation

## 2012-04-22 DIAGNOSIS — M25569 Pain in unspecified knee: Secondary | ICD-10-CM | POA: Insufficient documentation

## 2012-04-24 ENCOUNTER — Other Ambulatory Visit: Payer: Self-pay | Admitting: Family Medicine

## 2012-04-25 ENCOUNTER — Ambulatory Visit: Payer: Medicare Other | Admitting: Physical Therapy

## 2012-04-29 ENCOUNTER — Encounter: Payer: Self-pay | Admitting: Gastroenterology

## 2012-04-30 ENCOUNTER — Ambulatory Visit: Payer: Medicare Other | Admitting: Physical Therapy

## 2012-05-02 ENCOUNTER — Ambulatory Visit: Payer: Medicare Other | Admitting: Physical Therapy

## 2012-05-07 ENCOUNTER — Encounter: Payer: Medicare Other | Admitting: Physical Therapy

## 2012-05-09 ENCOUNTER — Encounter: Payer: Medicare Other | Admitting: Physical Therapy

## 2012-05-29 ENCOUNTER — Encounter: Payer: Medicare Other | Admitting: Physical Therapy

## 2012-05-31 ENCOUNTER — Encounter: Payer: Medicare Other | Admitting: Physical Therapy

## 2012-06-07 ENCOUNTER — Ambulatory Visit: Payer: Medicare Other | Attending: Orthopaedic Surgery | Admitting: Physical Therapy

## 2012-06-07 DIAGNOSIS — IMO0001 Reserved for inherently not codable concepts without codable children: Secondary | ICD-10-CM | POA: Insufficient documentation

## 2012-06-07 DIAGNOSIS — M25569 Pain in unspecified knee: Secondary | ICD-10-CM | POA: Insufficient documentation

## 2012-06-07 DIAGNOSIS — M25669 Stiffness of unspecified knee, not elsewhere classified: Secondary | ICD-10-CM | POA: Insufficient documentation

## 2012-06-07 DIAGNOSIS — Z96659 Presence of unspecified artificial knee joint: Secondary | ICD-10-CM | POA: Insufficient documentation

## 2012-06-07 DIAGNOSIS — R5381 Other malaise: Secondary | ICD-10-CM | POA: Insufficient documentation

## 2012-06-10 ENCOUNTER — Ambulatory Visit: Payer: Medicare Other | Admitting: Physical Therapy

## 2012-06-13 ENCOUNTER — Ambulatory Visit: Payer: Medicare Other | Admitting: Physical Therapy

## 2012-06-18 ENCOUNTER — Encounter: Payer: Medicare Other | Admitting: Physical Therapy

## 2012-06-20 ENCOUNTER — Ambulatory Visit: Payer: Medicare Other | Attending: Orthopaedic Surgery | Admitting: Physical Therapy

## 2012-06-20 DIAGNOSIS — Z96659 Presence of unspecified artificial knee joint: Secondary | ICD-10-CM | POA: Insufficient documentation

## 2012-06-20 DIAGNOSIS — IMO0001 Reserved for inherently not codable concepts without codable children: Secondary | ICD-10-CM | POA: Insufficient documentation

## 2012-06-20 DIAGNOSIS — M25569 Pain in unspecified knee: Secondary | ICD-10-CM | POA: Insufficient documentation

## 2012-06-20 DIAGNOSIS — M25669 Stiffness of unspecified knee, not elsewhere classified: Secondary | ICD-10-CM | POA: Insufficient documentation

## 2012-06-20 DIAGNOSIS — R5381 Other malaise: Secondary | ICD-10-CM | POA: Insufficient documentation

## 2012-06-26 ENCOUNTER — Encounter: Payer: Medicare Other | Admitting: *Deleted

## 2012-06-28 ENCOUNTER — Ambulatory Visit: Payer: Medicare Other | Admitting: *Deleted

## 2012-07-02 ENCOUNTER — Ambulatory Visit: Payer: Medicare Other | Admitting: Physical Therapy

## 2012-07-04 ENCOUNTER — Ambulatory Visit: Payer: Medicare Other | Admitting: Physical Therapy

## 2012-07-09 ENCOUNTER — Encounter: Payer: Medicare Other | Admitting: Physical Therapy

## 2012-07-10 ENCOUNTER — Encounter: Payer: Self-pay | Admitting: Gastroenterology

## 2012-07-11 ENCOUNTER — Ambulatory Visit: Payer: Medicare Other | Admitting: Physical Therapy

## 2012-07-17 ENCOUNTER — Ambulatory Visit: Payer: Medicare Other | Attending: Orthopaedic Surgery | Admitting: Physical Therapy

## 2012-07-17 DIAGNOSIS — M25569 Pain in unspecified knee: Secondary | ICD-10-CM | POA: Insufficient documentation

## 2012-07-17 DIAGNOSIS — M25669 Stiffness of unspecified knee, not elsewhere classified: Secondary | ICD-10-CM | POA: Insufficient documentation

## 2012-07-17 DIAGNOSIS — R5381 Other malaise: Secondary | ICD-10-CM | POA: Insufficient documentation

## 2012-07-17 DIAGNOSIS — IMO0001 Reserved for inherently not codable concepts without codable children: Secondary | ICD-10-CM | POA: Insufficient documentation

## 2012-07-17 DIAGNOSIS — Z96659 Presence of unspecified artificial knee joint: Secondary | ICD-10-CM | POA: Insufficient documentation

## 2012-08-27 ENCOUNTER — Encounter: Payer: Medicare Other | Admitting: Gastroenterology

## 2012-10-11 ENCOUNTER — Encounter: Payer: Self-pay | Admitting: Gastroenterology

## 2012-11-26 ENCOUNTER — Ambulatory Visit: Payer: Medicare Other | Admitting: Family Medicine

## 2012-11-28 ENCOUNTER — Encounter: Payer: Self-pay | Admitting: Family Medicine

## 2012-11-28 ENCOUNTER — Ambulatory Visit (INDEPENDENT_AMBULATORY_CARE_PROVIDER_SITE_OTHER): Payer: Medicare Other | Admitting: Family Medicine

## 2012-11-28 VITALS — BP 160/80 | HR 51 | Temp 97.9°F | Ht 62.0 in | Wt 142.0 lb

## 2012-11-28 DIAGNOSIS — J069 Acute upper respiratory infection, unspecified: Secondary | ICD-10-CM

## 2012-11-28 MED ORDER — FEXOFENADINE HCL 180 MG PO TABS: 180.0000 mg | ORAL_TABLET | Freq: Every day | ORAL | Status: DC | PRN

## 2012-11-28 MED ORDER — OMEPRAZOLE 10 MG PO CPDR: 10.0000 mg | DELAYED_RELEASE_CAPSULE | Freq: Every day | ORAL | Status: DC

## 2012-11-28 NOTE — Patient Instructions (Addendum)
Amy Rodriguez,  Thank you for coming in today. Your symptoms and exam are consistent with viral URI.  Please do the following:  1. Restart allegra 2. Use nasal saline for congestion 3. Do hot water steams: hot water in bowel, clothe over your head, breath in steam.  4. Tylenol or motrin is fine for the soreness.  F/u as needed.  Dr. Armen Pickup

## 2012-11-28 NOTE — Assessment & Plan Note (Signed)
A: Your symptoms and exam are consistent with viral URI. Improving.  P:  Please do the following:  1. Restart allegra 2. Use nasal saline for congestion 3. Do hot water steams: hot water in bowel, clothe over your head, breath in steam.  4. Tylenol or motrin is fine for the sorenes

## 2012-11-28 NOTE — Progress Notes (Signed)
Subjective:     [name], female   DOB: Jan 06, 1934, 79 y.o.   MRN: [id number]  Sore Throat  This is a new problem. The current episode started in the past 7 days. The problem has been gradually improving. There has been no fever. The pain is mild. Associated symptoms include congestion, coughing and ear pain. She has had no exposure to strep or mono. She has tried NSAIDs and gargles for the symptoms. The treatment provided significant relief.  Sinus Problem Associated symptoms include congestion, coughing and ear pain.     Review of Systems  HENT: Positive for ear pain and congestion.   Respiratory: Positive for cough.       Objective:   Physical Exam BP 160/80  Pulse 51  Temp(Src) 97.9 F (36.6 C) (Oral)  Ht 5\' 2"  (1.575 m)  Wt 142 lb (64.411 kg)  BMI 25.97 kg/m2  SpO2 96% General appearance: alert, cooperative and no distress Head: Normocephalic, without obvious abnormality, atraumatic Eyes: conjunctivae/corneas clear. PERRL, EOM's intact.  Ears: normal TM and external ear canal left ear and abnormal TM right ear - air-fluid level Nose: no discharge, turbinates normal, swollen, friable appearing R nasal septum  Throat: lips, mucosa, and tongue normal; teeth and gums normal Neck: mild anterior cervical adenopathy, no adenopathy, no carotid bruit, no JVD, supple, symmetrical, trachea midline and thyroid not enlarged, symmetric, no tenderness/mass/nodules Lungs: clear to auscultation bilaterally Heart: regular rate and rhythm, S1, S2 normal, no murmur, click, rub or gallop    Assessment and Plan:

## 2012-12-04 ENCOUNTER — Encounter: Payer: Self-pay | Admitting: Cardiology

## 2012-12-04 ENCOUNTER — Ambulatory Visit (INDEPENDENT_AMBULATORY_CARE_PROVIDER_SITE_OTHER): Payer: Medicare Other | Admitting: Cardiology

## 2012-12-04 VITALS — BP 160/84 | HR 72 | Ht 62.0 in | Wt 143.0 lb

## 2012-12-04 DIAGNOSIS — I10 Essential (primary) hypertension: Secondary | ICD-10-CM

## 2012-12-04 DIAGNOSIS — I471 Supraventricular tachycardia: Secondary | ICD-10-CM

## 2012-12-04 DIAGNOSIS — R002 Palpitations: Secondary | ICD-10-CM

## 2012-12-04 MED ORDER — ATENOLOL 25 MG PO TABS: 25.0000 mg | ORAL_TABLET | Freq: Every day | ORAL | Status: DC

## 2012-12-04 NOTE — Patient Instructions (Addendum)
You have been referred to [facility] [place].  Your physician wants you to follow-up in: 1 YEAR WITH DR Kirtland Bouchard. Delton See You will receive a reminder letter in the mail two months in advance. If you don't receive a letter, please call our office to schedule the follow-up appointment.  Your physician recommends that you continue on your current medications as directed. Please refer to the Current Medication list given to you today.

## 2012-12-04 NOTE — Assessment & Plan Note (Signed)
Stable. Atenolol refill. Will status her primary care and have her followup here with Dr. Delton See in one year.

## 2012-12-04 NOTE — Progress Notes (Signed)
HPI Amy Rodriguez returns today for evaluation and management of her history of SVT. She's had no recurrent episodes. She denies any chest pain, palpitations, presyncope, orthopnea, or edema. She would like to establish her primary care with .  Past Medical History  Diagnosis Date  . Palpitations   . Supraventricular tachycardia, paroxysmal   . HTN (hypertension)   . Hyperlipidemia   . Postmenopausal HRT (hormone replacement therapy)   . URI (upper respiratory infection)   . Pharyngitis   . Other seborrheic keratosis   . Breast screening, unspecified   . Vaginal pruritus   . Urinary frequency   . Diverticulosis of colon   . Blood in stool   . Pain in joint, lower leg   . Osteopenia   . IBS (irritable bowel syndrome)   . Hypothyroidism     TSH 14.488 (11/2005)  . H pylori ulcer     not treated due to expense  10/2001  . PPD positive     history +PPD 1984, no treatment, no abnormal CXR  . Posterior vitreous detachment 1996  . Multiple pigmented nevi     last derm evaluation 01/2003  . Postmenopausal     s/p hysterectomy for h/o cervical cancer 1982 (both ovaries taken at that time) now on hormonal replacement   . Shortness of breath     with ambulation  . Environmental allergies   . Lactose intolerance   . GERD (gastroesophageal reflux disease)   . Hand dermatitis   . Cervical cancer     s/p hysterectomy/oop  . PONV (postoperative nausea and vomiting)     "and takes me a long time to come out under it" (02/08/2012)  . Pneumonia     "couple times in my lifetime" (02/08/2012)  . History of bronchitis     "w/a cold" (02/08/2012)  . Migraines     "get them very rarely now" (02/08/2012)  . OA (osteoarthritis)     multiple sites  . Arthritis     sed rate 10, RF, CCP pending  . DDD (degenerative disc disease), lumbosacral   . Osteoporosis     "borderline" (02/08/2012)  . Depression     Due to husbands passing away 09/22/2011    Current Outpatient Prescriptions   Medication Sig Dispense Refill  . atenolol (TENORMIN) 25 MG tablet Take 25 mg by mouth daily.      . fexofenadine (ALLEGRA) 180 MG tablet Take 1 tablet (180 mg total) by mouth daily as needed. Allergies  90 tablet  3  . fluticasone (FLONASE) 50 MCG/ACT nasal spray Place 2 sprays into the nose as needed. Allergies      . levothyroxine (SYNTHROID, LEVOTHROID) 50 MCG tablet Take 1 tablet (50 mcg total) by mouth daily before breakfast.  30 tablet  1  . omeprazole (PRILOSEC) 10 MG capsule Take 1 capsule (10 mg total) by mouth daily.  90 capsule  0  . simvastatin (ZOCOR) 20 MG tablet Take 20 mg by mouth every evening.       No current facility-administered medications for this visit.    Allergies  Allergen Reactions  . Benzonatate Nausea And Vomiting    "tessalon pearl"  . Doxycycline Nausea And Vomiting  . Sulfonamide Derivatives Itching and Rash  . Amlodipine Other (See Comments)    Heart palpitations     Family History  Problem Relation Age of Onset  . Breast cancer Mother 64  . Lung cancer Brother 25  . Heart failure Father  congestive    History   Social History  . Marital Status: Married    Spouse Name: N/A    Number of Children: 3  . Years of Education: N/A   Occupational History  . retired     Engineering geologist   Social History Main Topics  . Smoking status: Former Smoker -- 1.00 packs/day for 25 years    Types: Cigarettes    Quit date: 04/17/1980  . Smokeless tobacco: Never Used     Comment: passive smoker, husband smoked  . Alcohol Use: No  . Drug Use: No  . Sexual Activity: No   Other Topics Concern  . Not on file   Social History Narrative   Lives with husband [name].  She does not drive- he drives her to appointments.  She used to work in Engineering geologist, retired.      ROS ALL NEGATIVE EXCEPT THOSE NOTED IN HPI  PE  General Appearance: well developed, well nourished in no acute distress HEENT: symmetrical face, PERRLA, good dentition  Neck: no JVD,  thyromegaly, or adenopathy, trachea midline Chest: symmetric without deformity Cardiac: PMI non-displaced, RRR, normal S1, S2, no gallop or murmur Lung: clear to ausculation and percussion Vascular: all pulses full without bruits  Abdominal: nondistended, nontender, good bowel sounds, no HSM, no bruits Extremities: no cyanosis, clubbing or edema, no sign of DVT, no varicosities  Skin: normal color, no rashes Neuro: alert and oriented x 3, non-focal Pysch: normal affect  EKG Sinus bradycardia with sinus arrhythmia, diffuse nonspecific ST segment changes. BMET    Component Value Date/Time   NA 136 03/18/2012 1114   K 4.4 03/18/2012 1114   CL 100 03/18/2012 1114   CO2 28 03/18/2012 1114   GLUCOSE 96 03/18/2012 1114   BUN 7 03/18/2012 1114   CREATININE 0.78 03/18/2012 1114   CREATININE 0.62 02/14/2012 0500   CALCIUM 9.4 03/18/2012 1114   GFRNONAA 85* 02/14/2012 0500   GFRAA >90 02/14/2012 0500    Lipid Panel     Component Value Date/Time   CHOL 154 03/18/2012 1114   TRIG 131 03/18/2012 1114   HDL 45 03/18/2012 1114   CHOLHDL 3.4 03/18/2012 1114   VLDL 26 03/18/2012 1114   LDLCALC 83 03/18/2012 1114    CBC    Component Value Date/Time   WBC 5.8 03/18/2012 1114   RBC 4.36 03/18/2012 1114   HGB 12.3 03/18/2012 1114   HCT 36.4 03/18/2012 1114   PLT 230 03/18/2012 1114   MCV 83.5 03/18/2012 1114   MCH 28.2 03/18/2012 1114   MCHC 33.8 03/18/2012 1114   RDW 14.2 03/18/2012 1114   LYMPHSABS 1.6 03/18/2012 1114   MONOABS 0.7 03/18/2012 1114   EOSABS 0.2 03/18/2012 1114   BASOSABS 0.0 03/18/2012 1114

## 2012-12-17 ENCOUNTER — Ambulatory Visit (INDEPENDENT_AMBULATORY_CARE_PROVIDER_SITE_OTHER): Payer: Medicare Other | Admitting: Nurse Practitioner

## 2012-12-17 ENCOUNTER — Encounter: Payer: Self-pay | Admitting: Nurse Practitioner

## 2012-12-17 VITALS — BP 140/70 | HR 51 | Temp 98.6°F | Ht 62.0 in | Wt 142.5 lb

## 2012-12-17 DIAGNOSIS — D229 Melanocytic nevi, unspecified: Secondary | ICD-10-CM

## 2012-12-17 DIAGNOSIS — E039 Hypothyroidism, unspecified: Secondary | ICD-10-CM

## 2012-12-17 DIAGNOSIS — K219 Gastro-esophageal reflux disease without esophagitis: Secondary | ICD-10-CM

## 2012-12-17 DIAGNOSIS — I10 Essential (primary) hypertension: Secondary | ICD-10-CM

## 2012-12-17 DIAGNOSIS — Z23 Encounter for immunization: Secondary | ICD-10-CM

## 2012-12-17 DIAGNOSIS — Z0000 Encounter for general adult medical examination without abnormal findings: Secondary | ICD-10-CM

## 2012-12-17 DIAGNOSIS — M899 Disorder of bone, unspecified: Secondary | ICD-10-CM

## 2012-12-17 DIAGNOSIS — M1990 Unspecified osteoarthritis, unspecified site: Secondary | ICD-10-CM

## 2012-12-17 DIAGNOSIS — E785 Hyperlipidemia, unspecified: Secondary | ICD-10-CM

## 2012-12-17 DIAGNOSIS — M8580 Other specified disorders of bone density and structure, unspecified site: Secondary | ICD-10-CM

## 2012-12-17 DIAGNOSIS — Z1239 Encounter for other screening for malignant neoplasm of breast: Secondary | ICD-10-CM

## 2012-12-17 DIAGNOSIS — E559 Vitamin D deficiency, unspecified: Secondary | ICD-10-CM

## 2012-12-17 DIAGNOSIS — D485 Neoplasm of uncertain behavior of skin: Secondary | ICD-10-CM

## 2012-12-17 DIAGNOSIS — Z1211 Encounter for screening for malignant neoplasm of colon: Secondary | ICD-10-CM

## 2012-12-17 LAB — RENAL FUNCTION PANEL
Albumin: 4.6 g/dL (ref 3.5–5.2)
Chloride: 101 mEq/L (ref 96–112)
GFR: 62.9 mL/min (ref >60.00)
Phosphorus: 3.7 mg/dL (ref 2.3–4.6)
Potassium: 4.6 mEq/L (ref 3.5–5.1)
Sodium: 133 mEq/L — ABNORMAL LOW (ref 135–145)

## 2012-12-17 LAB — HEPATIC FUNCTION PANEL
ALT: 10 U/L (ref 0–35)
Bilirubin, Direct: 0.1 mg/dL (ref 0.0–0.3)
Total Bilirubin: 0.6 mg/dL (ref 0.3–1.2)

## 2012-12-17 LAB — URINALYSIS, ROUTINE W REFLEX MICROSCOPIC
Nitrite: NEGATIVE
RBC / HPF: NONE SEEN (ref 0–?)
Specific Gravity, Urine: 1.01 (ref 1.000–1.030)
Urine Glucose: NEGATIVE
Urobilinogen, UA: 0.2 (ref 0.0–1.0)

## 2012-12-17 LAB — CBC
MCV: 84.4 fl (ref 78.0–100.0)
RBC: 4.61 Mil/uL (ref 3.87–5.11)
RDW: 13.5 % (ref 11.5–14.6)
WBC: 6.3 10*3/uL (ref 4.5–10.5)

## 2012-12-17 LAB — LIPID PANEL
LDL Cholesterol: 67 mg/dL (ref 0–99)
Total CHOL/HDL Ratio: 3

## 2012-12-17 MED ORDER — DICLOFENAC SODIUM 1 % TD GEL: 2.0000 g | Freq: Four times a day (QID) | TRANSDERMAL | Status: DC

## 2012-12-17 MED ORDER — TETANUS-DIPHTH-ACELL PERTUSSIS 5-2.5-18.5 LF-MCG/0.5 IM SUSP
0.5000 mL | Freq: Once | INTRAMUSCULAR | Status: AC
  Administered 2012-12-17: 0.5 mL via INTRAMUSCULAR

## 2012-12-17 MED ORDER — ZOSTER VACCINE LIVE 19400 UNT/0.65ML ~~LOC~~ SOLR: 0.6500 mL | Freq: Once | SUBCUTANEOUS | Status: DC

## 2012-12-17 NOTE — Progress Notes (Signed)
Subjective:     Amy Rodriguez is a 79 y.o. female and is here to establish care, and for a comprehensive physical exam. She has been treated by [facility] for many years, but desires a medical practice closer to home. She is currently treated for HTN, GERD, hypothyroidism, OA, osteopenia, vit D def., and sees cardiology for Hx of SVT. The patient reports concern over several moles and occasional sensation of pressure in throat.Marland Kitchen  History   Social History  . Marital Status: Married    Spouse Name: N/A    Number of Children: 3  . Years of Education: N/A   Occupational History  . retired     Engineering geologist   Social History Main Topics  . Smoking status: Former Smoker -- 1.00 packs/day for 25 years    Types: Cigarettes    Quit date: 04/17/1980  . Smokeless tobacco: Never Used     Comment: passive smoker, husband smoked  . Alcohol Use: No  . Drug Use: No  . Sexual Activity: No   Other Topics Concern  . Not on file   Social History Narrative   Lives with son, Karleen Hampshire who has medical problems-they help each other. Her husband died October 25, 2022 after 39 years of marriage.  She has 2 other sons that live within 2 hrs from her. She does not drive-she uses [facility]. She used to work in Engineering geologist, retired.     Health Maintenance  Topic Date Due  . Zostavax  11/21/1995  . Tetanus/tdap  06/15/2012  . Influenza Vaccine  11/15/2012  . Colonoscopy  04/21/2017  . Pneumococcal Polysaccharide Vaccine Age 46 And Over  Completed    The following portions of the patient's history were reviewed and updated as appropriate: allergies, current medications, past family history, past medical history, past social history, past surgical history and problem list.  Review of Systems Constitutional: negative for chills, fatigue, night sweats and weight loss Eyes: positive for contacts/glasses Ears, nose, mouth, throat, and face: positive for post-nasal drip Respiratory: positive for cough and worse  in am, negative for asthma, chronic bronchitis, pleurisy/chest pain and wheezing Cardiovascular: positive for Hx of SVT, negative for chest pain, chest pressure/discomfort and lower extremity edema Gastrointestinal: negative for abdominal pain, change in bowel habits and nausea Genitourinary:negative for dysuria and frequency Integument/breast: positive for changed mole Musculoskeletal:positive for arthralgias Neurological: negative for coordination problems, dizziness, gait problems, headaches, memory problems and paresthesia Behavioral/Psych: positive for feelings of sadness, continues to grieve husband, negative for anxiety, decreased appetite, depression, illegal drug usage, increased appetite and loss of interest in favorite activities Endocrine: negative for diabetic symptoms including polydipsia, polyphagia and polyuria and temperature intolerance Allergic/Immunologic: positive for morning congestion-may have mold in house   Objective:    BP 140/70  Pulse 51  Temp(Src) 98.6 F (37 C) (Oral)  Ht 5\' 2"  (1.575 m)  Wt 142 lb 8 oz (64.638 kg)  BMI 26.06 kg/m2  SpO2 95% General appearance: alert, cooperative, appears stated age, no distress and some tearfulness when asked about husband Head: Normocephalic, without obvious abnormality, atraumatic Eyes: negative findings: lids and lashes normal, conjunctivae and sclerae normal, corneas clear and pupils equal, round, reactive to light and accomodation Ears: normal TM's and external ear canals both ears and bilat TM retracted, effusion R middle ear Throat: lips, mucosa, and tongue normal; teeth and gums normal Neck: no adenopathy, no carotid bruit, supple, symmetrical, trachea midline and thyroid not enlarged, symmetric, no tenderness/mass/nodules Back: symmetric, no curvature.  ROM normal. No CVA tenderness., mild kyphosis Lungs: clear to auscultation bilaterally Heart: regular rate and rhythm, S1, S2 normal, no murmur, click, rub or  gallop Abdomen: soft, non-tender; bowel sounds normal; no masses,  no organomegaly and mild tenderness to palpation at LLQ Pelvic: deferred Extremities: extremities normal, atraumatic, no cyanosis or edema Pulses: 2+ and symmetric Skin: Skin color, texture, turgor normal. No rashes or lesions or several nevi: 3 cm brown nevus L trunk, scaly 1 cm nevous L neck, irritated scaly nevous R axillae, 1cm sebacious cyst LL abdomen Lymph nodes: Cervical, supraclavicular, and axillary nodes normal. Neurologic: Grossly normal   MSK: L knee warm, multiple deformities at dip & pip joints in hands. Assessment:    1 OA hands  2 prev care: needs tdap, flu, & shingles, MMG 3 osteopenia. Last bone density 2012 4  Hyperlipidemia 5 hypothyroidism 6 HTN-w/in normal limits for age in ofc 7 Hx SVT 8 Multiple suspicious moles 9 sebaceous cyst Plan:    1 1 T ibuprophen qd, 1000 mg tylenol tid PRN, voltaren gel as needed 2 Tdap & flu administered in ofc, prescription written for shingles, sched MMG 3 sched bone density, check vit D, continue citracal 4 check lipids today, cont meds, & daily walk 5 check TSH today, continue meds 6 renal & hep func, ua w/micro 7 followed by cardiology, cont atenolol 8 ref to derm 9 offered to express, pt declined, gave instructions for expressing  See After Visit Summary for Counseling Recommendations

## 2012-12-17 NOTE — Patient Instructions (Addendum)
We have talked about many things today. Regarding the feeling in your throat, I think that is reflux. Start taking prilosec every day. If the feeling does not get better in 2-3 weeks, please call our office to re-evaluate. Regarding moles, please see dermatology. Regarding arthritis, try voltaren cream. Also, you may take 1000 mg tylenol along with 200 mg ibuprophen once daily for pain. Please schedule colonoscopy with Dr. Arlyce Dice. Also schedule mammogram & bone density at your convenience. Regarding Vitamin D deficiency, please take 2000 iu daily. Continue to take 15-20 minute walk everyday-it's better for your health than medicine! Pleasure to meet you! See you in 6 months or sooner if you need [place].   Preventive Care for Adults, Female A healthy lifestyle and preventive care can promote health and wellness. Preventive health guidelines for women include the following key practices.  A routine yearly physical is a good way to check with your caregiver about your health and preventive screening. It is a chance to share any concerns and updates on your health, and to receive a thorough exam.  Visit your dentist for a routine exam and preventive care every 6 months. Brush your teeth twice a day and floss once a day. Good oral hygiene prevents tooth decay and gum disease.  The frequency of eye exams is based on your age, health, family medical history, use of contact lenses, and other factors. Follow your caregiver's recommendations for frequency of eye exams.  Eat a healthy diet. Foods like vegetables, fruits, whole grains, low-fat dairy products, and lean protein foods contain the nutrients you need without too many calories. Decrease your intake of foods high in solid fats, added sugars, and salt. Eat the right amount of calories for you.Get information about a proper diet from your caregiver, if necessary.  Regular physical exercise is one of the most important things you can do for your health. Most  adults should get at least 150 minutes of moderate-intensity exercise (any activity that increases your heart rate and causes you to sweat) each week. In addition, most adults need muscle-strengthening exercises on 2 or more days a week.  Maintain a healthy weight. The body mass index (BMI) is a screening tool to identify possible weight problems. It provides an estimate of body fat based on height and weight. Your caregiver can help determine your BMI, and can help you achieve or maintain a healthy weight.For adults 20 years and older:  A BMI below 18.5 is considered underweight.  A BMI of 18.5 to 24.9 is normal.  A BMI of 25 to 29.9 is considered overweight.  A BMI of 30 and above is considered obese.  Maintain normal blood lipids and cholesterol levels by exercising and minimizing your intake of saturated fat. Eat a balanced diet with plenty of fruit and vegetables. Blood tests for lipids and cholesterol should begin at age 50 and be repeated every 5 years. If your lipid or cholesterol levels are high, you are over 50, or you are at high risk for heart disease, you may need your cholesterol levels checked more frequently.Ongoing high lipid and cholesterol levels should be treated with medicines if diet and exercise are not effective.  If you smoke, find out from your caregiver how to quit. If you do not use tobacco, do not start.  If you are pregnant, do not drink alcohol. If you are breastfeeding, be very cautious about drinking alcohol. If you are not pregnant and choose to drink alcohol, do not exceed 1 drink  per day. One drink is considered to be 12 ounces (355 mL) of beer, 5 ounces (148 mL) of wine, or 1.5 ounces (44 mL) of liquor.  Avoid use of street drugs. Do not share needles with anyone. Ask for help if you need support or instructions about stopping the use of drugs.  High blood pressure causes heart disease and increases the risk of stroke. Your blood pressure should be checked  at least every 1 to 2 years. Ongoing high blood pressure should be treated with medicines if weight loss and exercise are not effective.  If you are 8 to 77 years old, ask your caregiver if you should take aspirin to prevent strokes.  Diabetes screening involves taking a blood sample to check your fasting blood sugar level. This should be done once every 3 years, after age 18, if you are within normal weight and without risk factors for diabetes. Testing should be considered at a younger age or be carried out more frequently if you are overweight and have at least 1 risk factor for diabetes.  Breast cancer screening is essential preventive care for women. You should practice "breast self-awareness." This means understanding the normal appearance and feel of your breasts and may include breast self-examination. Any changes detected, no matter how small, should be reported to a caregiver. Women in their 17s and 30s should have a clinical breast exam (CBE) by a caregiver as part of a regular health exam every 1 to 3 years. After age 32, women should have a CBE every year. Starting at age 88, women should consider having a mammography (breast X-ray test) every year. Women who have a family history of breast cancer should talk to their caregiver about genetic screening. Women at a high risk of breast cancer should talk to their caregivers about having magnetic resonance imaging (MRI) and a mammography every year.  The Pap test is a screening test for cervical cancer. A Pap test can show cell changes on the cervix that might become cervical cancer if left untreated. A Pap test is a procedure in which cells are obtained and examined from the lower end of the uterus (cervix).  Women should have a Pap test starting at age 52.  Between ages 76 and 29, Pap tests should be repeated every 2 years.  Beginning at age 65, you should have a Pap test every 3 years as long as the past 3 Pap tests have been  normal.  Some women have medical problems that increase the chance of getting cervical cancer. Talk to your caregiver about these problems. It is especially important to talk to your caregiver if a new problem develops soon after your last Pap test. In these cases, your caregiver may recommend more frequent screening and Pap tests.  The above recommendations are the same for women who have or have not gotten the vaccine for human papillomavirus (HPV).  If you had a hysterectomy for a problem that was not cancer or a condition that could lead to cancer, then you no longer need Pap tests. Even if you no longer need a Pap test, a regular exam is a good idea to make sure no other problems are starting.  If you are between ages 24 and 31, and you have had normal Pap tests going back 10 years, you no longer need Pap tests. Even if you no longer need a Pap test, a regular exam is a good idea to make sure no other problems are starting.  If you have had past treatment for cervical cancer or a condition that could lead to cancer, you need Pap tests and screening for cancer for at least 20 years after your treatment.  If Pap tests have been discontinued, risk factors (such as a new sexual partner) need to be reassessed to determine if screening should be resumed.  The HPV test is an additional test that may be used for cervical cancer screening. The HPV test looks for the virus that can cause the cell changes on the cervix. The cells collected during the Pap test can be tested for HPV. The HPV test could be used to screen women aged 97 years and older, and should be used in women of any age who have unclear Pap test results. After the age of 36, women should have HPV testing at the same frequency as a Pap test.  Colorectal cancer can be detected and often prevented. Most routine colorectal cancer screening begins at the age of 74 and continues through age 67. However, your caregiver may recommend screening at  an earlier age if you have risk factors for colon cancer. On a yearly basis, your caregiver may provide home test kits to check for hidden blood in the stool. Use of a small camera at the end of a tube, to directly examine the colon (sigmoidoscopy or colonoscopy), can detect the earliest forms of colorectal cancer. Talk to your caregiver about this at age 60, when routine screening begins. Direct examination of the colon should be repeated every 5 to 10 years through age 74, unless early forms of pre-cancerous polyps or small growths are found.  Hepatitis C blood testing is recommended for all people born from [birth year] through 1965 and any individual with known risks for hepatitis C.  Practice safe sex. Use condoms and avoid high-risk sexual practices to reduce the spread of sexually transmitted infections (STIs). STIs include gonorrhea, chlamydia, syphilis, trichomonas, herpes, HPV, and human immunodeficiency virus (HIV). Herpes, HIV, and HPV are viral illnesses that have no cure. They can result in disability, cancer, and death. Sexually active women aged 14 and younger should be checked for chlamydia. Older women with new or multiple partners should also be tested for chlamydia. Testing for other STIs is recommended if you are sexually active and at increased risk.  Osteoporosis is a disease in which the bones lose minerals and strength with aging. This can result in serious bone fractures. The risk of osteoporosis can be identified using a bone density scan. Women ages 17 and over and women at risk for fractures or osteoporosis should discuss screening with their caregivers. Ask your caregiver whether you should take a calcium supplement or vitamin D to reduce the rate of osteoporosis.  Menopause can be associated with physical symptoms and risks. Hormone replacement therapy is available to decrease symptoms and risks. You should talk to your caregiver about whether hormone replacement therapy is right for  you.  Use sunscreen with sun protection factor (SPF) of 30 or more. Apply sunscreen liberally and repeatedly throughout the day. You should seek shade when your shadow is shorter than you. Protect yourself by wearing long sleeves, pants, a wide-brimmed hat, and sunglasses year round, whenever you are outdoors.  Once a month, do a whole body skin exam, using a mirror to look at the skin on your back. Notify your caregiver of new moles, moles that have irregular borders, moles that are larger than a pencil eraser, or moles that have changed in shape  or color.  Stay current with required immunizations.  Influenza. You need a dose every fall (or winter). The composition of the flu vaccine changes each year, so being vaccinated once is not enough.  Pneumococcal polysaccharide. You need 1 to 2 doses if you smoke cigarettes or if you have certain chronic medical conditions. You need 1 dose at age 51 (or older) if you have never been vaccinated.  Tetanus, diphtheria, pertussis (Tdap, Td). Get 1 dose of Tdap vaccine if you are younger than age 97, are over 16 and have contact with an infant, are a Research scientist (physical sciences), are pregnant, or simply want to be protected from whooping cough. After that, you need a Td booster dose every 10 years. Consult your caregiver if you have not had at least 3 tetanus and diphtheria-containing shots sometime in your life or have a deep or dirty wound.  HPV. You need this vaccine if you are a woman age 34 or younger. The vaccine is given in 3 doses over 6 months.  Measles, mumps, rubella (MMR). You need at least 1 dose of MMR if you were born in 1957 or later. You may also need a second dose.  Meningococcal. If you are age 74 to 30 and a first-year college student living in a residence hall, or have one of several medical conditions, you need to get vaccinated against meningococcal disease. You may also need additional booster doses.  Zoster (shingles). If you are age 56 or  older, you should get this vaccine.  Varicella (chickenpox). If you have never had chickenpox or you were vaccinated but received only 1 dose, talk to your caregiver to find out if you need this vaccine.  Hepatitis A. You need this vaccine if you have a specific risk factor for hepatitis A virus infection or you simply wish to be protected from this disease. The vaccine is usually given as 2 doses, 6 to 18 months apart.  Hepatitis B. You need this vaccine if you have a specific risk factor for hepatitis B virus infection or you simply wish to be protected from this disease. The vaccine is given in 3 doses, usually over 6 months. Preventive Services / Frequency Ages 107 to 76  Blood pressure check.** / Every 1 to 2 years.  Lipid and cholesterol check.** / Every 5 years beginning at age 53.  Clinical breast exam.** / Every 3 years for women in their 14s and 30s.  Pap test.** / Every 2 years from ages 1 through 53. Every 3 years starting at age 14 through age 73 or 40 with a history of 3 consecutive normal Pap tests.  HPV screening.** / Every 3 years from ages 79 through ages 41 to 68 with a history of 3 consecutive normal Pap tests.  Hepatitis C blood test.** / For any individual with known risks for hepatitis C.  Skin self-exam. / Monthly.  Influenza immunization.** / Every year.  Pneumococcal polysaccharide immunization.** / 1 to 2 doses if you smoke cigarettes or if you have certain chronic medical conditions.  Tetanus, diphtheria, pertussis (Tdap, Td) immunization. / A one-time dose of Tdap vaccine. After that, you need a Td booster dose every 10 years.  HPV immunization. / 3 doses over 6 months, if you are 75 and younger.  Measles, mumps, rubella (MMR) immunization. / You need at least 1 dose of MMR if you were born in 1957 or later. You may also need a second dose.  Meningococcal immunization. / 1 dose if you  are age 49 to 62 and a first-year college student living in a  residence hall, or have one of several medical conditions, you need to get vaccinated against meningococcal disease. You may also need additional booster doses.  Varicella immunization.** / Consult your caregiver.  Hepatitis A immunization.** / Consult your caregiver. 2 doses, 6 to 18 months apart.  Hepatitis B immunization.** / Consult your caregiver. 3 doses usually over 6 months. Ages 77 to 28  Blood pressure check.** / Every 1 to 2 years.  Lipid and cholesterol check.** / Every 5 years beginning at age 75.  Clinical breast exam.** / Every year after age 29.  Mammogram.** / Every year beginning at age 76 and continuing for as long as you are in good health. Consult with your caregiver.  Pap test.** / Every 3 years starting at age 51 through age 83 or 57 with a history of 3 consecutive normal Pap tests.  HPV screening.** / Every 3 years from ages 4 through ages 52 to 82 with a history of 3 consecutive normal Pap tests.  Fecal occult blood test (FOBT) of stool. / Every year beginning at age 47 and continuing until age 32. You may not need to do this test if you get a colonoscopy every 10 years.  Flexible sigmoidoscopy or colonoscopy.** / Every 5 years for a flexible sigmoidoscopy or every 10 years for a colonoscopy beginning at age 41 and continuing until age 68.  Hepatitis C blood test.** / For all people born from [birth year] through 1963 and any individual with known risks for hepatitis C.  Skin self-exam. / Monthly.  Influenza immunization.** / Every year.  Pneumococcal polysaccharide immunization.** / 1 to 2 doses if you smoke cigarettes or if you have certain chronic medical conditions.  Tetanus, diphtheria, pertussis (Tdap, Td) immunization.** / A one-time dose of Tdap vaccine. After that, you need a Td booster dose every 10 years.  Measles, mumps, rubella (MMR) immunization. / You need at least 1 dose of MMR if you were born in 1957 or later. You may also need a second  dose.  Varicella immunization.** / Consult your caregiver.  Meningococcal immunization.** / Consult your caregiver.  Hepatitis A immunization.** / Consult your caregiver. 2 doses, 6 to 18 months apart.  Hepatitis B immunization.** / Consult your caregiver. 3 doses, usually over 6 months. Ages 24 and over  Blood pressure check.** / Every 1 to 2 years.  Lipid and cholesterol check.** / Every 5 years beginning at age 66.  Clinical breast exam.** / Every year after age 87.  Mammogram.** / Every year beginning at age 41 and continuing for as long as you are in good health. Consult with your caregiver.  Pap test.** / Every 3 years starting at age 67 through age 28 or 69 with a 3 consecutive normal Pap tests. Testing can be stopped between 65 and 70 with 3 consecutive normal Pap tests and no abnormal Pap or HPV tests in the past 10 years.  HPV screening.** / Every 3 years from ages 78 through ages 3 or 63 with a history of 3 consecutive normal Pap tests. Testing can be stopped between 65 and 70 with 3 consecutive normal Pap tests and no abnormal Pap or HPV tests in the past 10 years.  Fecal occult blood test (FOBT) of stool. / Every year beginning at age 41 and continuing until age 56. You may not need to do this test if you get a colonoscopy every 10 years.  Flexible sigmoidoscopy or colonoscopy.** / Every 5 years for a flexible sigmoidoscopy or every 10 years for a colonoscopy beginning at age 67 and continuing until age 70.  Hepatitis C blood test.** / For all people born from [birth year] through 1963 and any individual with known risks for hepatitis C.  Osteoporosis screening.** / A one-time screening for women ages 89 and over and women at risk for fractures or osteoporosis.  Skin self-exam. / Monthly.  Influenza immunization.** / Every year.  Pneumococcal polysaccharide immunization.** / 1 dose at age 68 (or older) if you have never been vaccinated.  Tetanus, diphtheria, pertussis  (Tdap, Td) immunization. / A one-time dose of Tdap vaccine if you are over 65 and have contact with an infant, are a Research scientist (physical sciences), or simply want to be protected from whooping cough. After that, you need a Td booster dose every 10 years.  Varicella immunization.** / Consult your caregiver.  Meningococcal immunization.** / Consult your caregiver.  Hepatitis A immunization.** / Consult your caregiver. 2 doses, 6 to 18 months apart.  Hepatitis B immunization.** / Check with your caregiver. 3 doses, usually over 6 months. ** Family history and personal history of risk and conditions may change your caregiver's recommendations. Document Released: 05/30/2001 Document Revised: 06/26/2011 Document Reviewed: 08/29/2010 [facility] Patient Information 2014 [place], [place].

## 2012-12-18 ENCOUNTER — Telehealth: Payer: Self-pay | Admitting: Nurse Practitioner

## 2012-12-18 DIAGNOSIS — E785 Hyperlipidemia, unspecified: Secondary | ICD-10-CM

## 2012-12-18 DIAGNOSIS — E559 Vitamin D deficiency, unspecified: Secondary | ICD-10-CM

## 2012-12-18 LAB — VITAMIN D 25 HYDROXY (VIT D DEFICIENCY, FRACTURES): Vit D, 25-Hydroxy: 29 ng/mL — ABNORMAL LOW (ref 30–89)

## 2012-12-18 MED ORDER — VITAMIN D3 125 MCG (5000 UT) PO CAPS: 1.0000 | ORAL_CAPSULE | Freq: Every day | ORAL | Status: DC

## 2012-12-18 MED ORDER — SIMVASTATIN 10 MG PO TABS: 10.0000 mg | ORAL_TABLET | Freq: Every evening | ORAL | Status: DC

## 2012-12-18 NOTE — Telephone Encounter (Signed)
Sodium slightly low-advised to not restrict salt Hep func norm Lipids well controlled, will decrease zocor to 10 mg No diabetes Thyroid norm-no med change CBC norm ua norm Vit d low. Will start 5000 iu daily. All discussed w/pt. All questions answered.

## 2013-01-13 ENCOUNTER — Ambulatory Visit
Admission: RE | Admit: 2013-01-13 | Discharge: 2013-01-13 | Disposition: A | Discharge status: Home / Self Care | Payer: Medicaid Other | Source: Ambulatory Visit | Attending: Nurse Practitioner | Admitting: Nurse Practitioner

## 2013-01-13 ENCOUNTER — Telehealth: Payer: Self-pay | Admitting: Nurse Practitioner

## 2013-01-13 DIAGNOSIS — M8580 Other specified disorders of bone density and structure, unspecified site: Secondary | ICD-10-CM

## 2013-01-13 DIAGNOSIS — Z1239 Encounter for other screening for malignant neoplasm of breast: Secondary | ICD-10-CM

## 2013-01-13 NOTE — Telephone Encounter (Signed)
Marcelino Duster, please let the patient know that I have faxed her referral to [facility] & that they will be contacting her to set up an appointment.

## 2013-01-13 NOTE — Telephone Encounter (Signed)
MMG shows no suspicious lesion. F/u in 1 yr. Bone scan shows osteopenia, some progression since 2012 scan. Recommend continue w/ca supplement (getting at least 1/2 in food) & Vit D3. Limit caffeine to 1 cup coffee daily. Pt does not drink alcohol. Weight bearing exercises-walking, add weights. Will also ref to PT for teaching of weight bearing exercises for osteoporosis prevention.

## 2013-01-14 ENCOUNTER — Other Ambulatory Visit: Payer: Self-pay | Admitting: *Deleted

## 2013-01-14 MED ORDER — ATENOLOL 25 MG PO TABS: 25.0000 mg | ORAL_TABLET | Freq: Every day | ORAL | Status: DC

## 2013-01-15 ENCOUNTER — Other Ambulatory Visit: Payer: Self-pay | Admitting: *Deleted

## 2013-01-15 DIAGNOSIS — E785 Hyperlipidemia, unspecified: Secondary | ICD-10-CM

## 2013-01-16 ENCOUNTER — Other Ambulatory Visit: Payer: Self-pay | Admitting: *Deleted

## 2013-01-16 DIAGNOSIS — E785 Hyperlipidemia, unspecified: Secondary | ICD-10-CM

## 2013-01-16 MED ORDER — SIMVASTATIN 10 MG PO TABS: 10.0000 mg | ORAL_TABLET | Freq: Every evening | ORAL | Status: DC

## 2013-01-16 NOTE — Telephone Encounter (Signed)
Rx filled on 12/18/12 # x2. Called pharmacy who stated that rx was received on 12/18/12.

## 2013-01-16 NOTE — Telephone Encounter (Signed)
Patient notified and has already heard from PT in [place].

## 2013-02-10 ENCOUNTER — Telehealth: Payer: Self-pay | Admitting: Nurse Practitioner

## 2013-02-10 NOTE — Telephone Encounter (Signed)
Patient states she has been to 2 of her physical therapy appts. It has made her arthritis flair up so much she is using a walker. She does not want to continue with the therapy since it seems to be making her worse. Patient will call to cancel her next appointment.

## 2013-02-10 NOTE — Telephone Encounter (Signed)
Please advise 

## 2013-02-12 NOTE — Telephone Encounter (Signed)
See phone note

## 2013-03-07 ENCOUNTER — Encounter: Payer: Self-pay | Admitting: Family Medicine

## 2013-03-07 ENCOUNTER — Ambulatory Visit (INDEPENDENT_AMBULATORY_CARE_PROVIDER_SITE_OTHER): Payer: Medicare Other | Admitting: Family Medicine

## 2013-03-07 VITALS — BP 160/84 | HR 52 | Temp 98.4°F | Resp 16 | Ht 62.0 in | Wt 145.0 lb

## 2013-03-07 DIAGNOSIS — J209 Acute bronchitis, unspecified: Secondary | ICD-10-CM

## 2013-03-07 MED ORDER — PREDNISONE 20 MG PO TABS: ORAL_TABLET | ORAL | Status: DC

## 2013-03-07 MED ORDER — AZITHROMYCIN 250 MG PO TABS: ORAL_TABLET | ORAL | Status: DC

## 2013-03-07 NOTE — Patient Instructions (Signed)
Buy OTC generic robitussin DM and take as directed on packaging. 

## 2013-03-07 NOTE — Assessment & Plan Note (Signed)
Prolonged illness, not showing signs of improving. No sign of RAD. Will treat with Z-pack and prednisone 40mg  qd x 5d. Recommended OTC robitussin DM for symptomatic care. Push fluids, rest.

## 2013-03-07 NOTE — Progress Notes (Signed)
OFFICE NOTE  03/07/2013  CC:  Chief Complaint  Patient presents with  . Cough  . URI     HPI: Patient is a 79 y.o. Caucasian female who is here for respiratory symptoms. Onset 3 wks ago; mild ST, runny nose, PND, ear ache, nasal congestion, some cough that has now "gone to her chest".  Feels intermittent SOB, esp surrounding coughing spells and with walking. No fever.  Drinking and eating well still.  Has tried saline nasal spray, cough drops, herbal teas, honey and lemon.  ROS: nose bleed 2 d/a.    Pertinent PMH:  Past Medical History  Diagnosis Date  . Supraventricular tachycardia, paroxysmal   . HTN (hypertension)   . Hyperlipidemia   . Postmenopausal HRT (hormone replacement therapy)   . Other seborrheic keratosis   . Vaginal pruritus   . Urinary frequency   . Diverticulosis of colon   . Blood in stool   . Pain in joint, lower leg   . Osteopenia   . IBS (irritable bowel syndrome)   . Hypothyroidism     TSH 14.488 (11/2005)  . H pylori ulcer     not treated due to expense  10/2001  . PPD positive     history +PPD 1984, no treatment, no abnormal CXR  . Posterior vitreous detachment 1996  . Multiple pigmented nevi     last derm evaluation 01/2003  . Postmenopausal     s/p hysterectomy for h/o cervical cancer 1982 (both ovaries taken at that time) now on hormonal replacement   . Shortness of breath     with ambulation  . Environmental allergies   . Lactose intolerance   . GERD (gastroesophageal reflux disease)   . Hand dermatitis   . Cervical cancer     s/p hysterectomy/oop  . PONV (postoperative nausea and vomiting)     "and takes me a long time to come out under it" (02/08/2012)  . Pneumonia     "couple times in my lifetime" (02/08/2012)  . History of bronchitis     "w/a cold" (02/08/2012)  . Migraines     "get them very rarely now" (02/08/2012)  . OA (osteoarthritis)     multiple sites  . Arthritis     sed rate 10, RF, CCP pending  . DDD (degenerative  disc disease), lumbosacral   . Osteoporosis     "borderline" (02/08/2012)  . Depression     Due to husbands passing away 09/22/2011   Past surgical, social, and family history reviewed and no changes noted since last office visit.  MEDS:  Outpatient Prescriptions Prior to Visit  Medication Sig Dispense Refill  . atenolol (TENORMIN) 25 MG tablet Take 1 tablet (25 mg total) by mouth daily.  90 tablet  3  . Cholecalciferol (VITAMIN D3) 5000 UNITS CAPS Take 1 capsule by mouth daily. Take with food containing fats.  30 capsule    . diclofenac sodium (VOLTAREN) 1 % GEL Apply 2 g topically 4 (four) times daily.  1 Tube  2  . fexofenadine (ALLEGRA) 180 MG tablet Take 1 tablet (180 mg total) by mouth daily as needed. Allergies  90 tablet  3  . levothyroxine (SYNTHROID, LEVOTHROID) 50 MCG tablet Take 1 tablet (50 mcg total) by mouth daily before breakfast.  30 tablet  1  . omeprazole (PRILOSEC) 10 MG capsule Take 1 capsule (10 mg total) by mouth daily.  90 capsule  0  . simvastatin (ZOCOR) 10 MG tablet Take 1 tablet (10 mg  total) by mouth every evening.  90 tablet  1  . zoster vaccine live, PF, (ZOSTAVAX) 16109 UNT/0.65ML injection Inject 19,400 Units into the skin once.  1 each  0  . fluticasone (FLONASE) 50 MCG/ACT nasal spray Place 2 sprays into the nose as needed. Allergies       No facility-administered medications prior to visit.    PE: Blood pressure 160/84, pulse 52, temperature 98.4 F (36.9 C), temperature source Temporal, resp. rate 16, height 5\' 2"  (1.575 m), weight 145 lb (65.772 kg), SpO2 95.00%. VS: noted--normal. Gen: alert, NAD, NONTOXIC APPEARING. HEENT: eyes without injection, drainage, or swelling.  Ears: EACs clear, TMs with normal light reflex and landmarks.  Nose: Clear rhinorrhea, with some dried, crusty exudate adherent to mildly injected mucosa.  No purulent d/c.  No paranasal sinus TTP.  No facial swelling.  Throat and mouth without focal lesion.  No pharyngial swelling,  erythema, or exudate.   Neck: supple, no LAD.   LUNGS: CTA bilat, nonlabored resps.  Some trace coarse end-exp wheezing and post exhalation coughing with forced expiration maneuver. CV: RRR, no m/r/g. EXT: no c/c/e SKIN: no rash  LAB: none  IMPRESSION AND PLAN:  Acute bronchitis Prolonged illness, not showing signs of improving. No sign of RAD. Will treat with Z-pack and prednisone 40mg  qd x 5d. Recommended OTC robitussin DM for symptomatic care. Push fluids, rest.   An After Visit Summary was printed and given to the patient.  FOLLOW UP: prn

## 2013-03-10 ENCOUNTER — Ambulatory Visit: Payer: Medicare Other | Admitting: Nurse Practitioner

## 2013-03-18 ENCOUNTER — Telehealth: Payer: Self-pay | Admitting: Nurse Practitioner

## 2013-03-18 NOTE — Telephone Encounter (Signed)
Patient has thrush in her mouth due to taking antibiotics. Can an Rx be sent to [facility]

## 2013-03-18 NOTE — Telephone Encounter (Signed)
LMOVM for patient to return call.

## 2013-03-18 NOTE — Telephone Encounter (Signed)
LMOVM 2nd call.

## 2013-03-19 ENCOUNTER — Ambulatory Visit (INDEPENDENT_AMBULATORY_CARE_PROVIDER_SITE_OTHER): Payer: Medicare Other | Admitting: Cardiology

## 2013-03-19 ENCOUNTER — Ambulatory Visit (INDEPENDENT_AMBULATORY_CARE_PROVIDER_SITE_OTHER): Payer: Medicare Other | Admitting: Nurse Practitioner

## 2013-03-19 ENCOUNTER — Encounter: Payer: Self-pay | Admitting: Nurse Practitioner

## 2013-03-19 ENCOUNTER — Encounter: Payer: Self-pay | Admitting: Cardiology

## 2013-03-19 VITALS — BP 140/70 | HR 45 | Temp 97.7°F | Ht 62.0 in | Wt 146.8 lb

## 2013-03-19 VITALS — BP 152/80 | HR 58 | Wt 146.0 lb

## 2013-03-19 DIAGNOSIS — I471 Supraventricular tachycardia: Secondary | ICD-10-CM

## 2013-03-19 DIAGNOSIS — E785 Hyperlipidemia, unspecified: Secondary | ICD-10-CM

## 2013-03-19 DIAGNOSIS — R42 Dizziness and giddiness: Secondary | ICD-10-CM | POA: Insufficient documentation

## 2013-03-19 DIAGNOSIS — R002 Palpitations: Secondary | ICD-10-CM

## 2013-03-19 DIAGNOSIS — I10 Essential (primary) hypertension: Secondary | ICD-10-CM

## 2013-03-19 DIAGNOSIS — I4891 Unspecified atrial fibrillation: Secondary | ICD-10-CM

## 2013-03-19 DIAGNOSIS — K1379 Other lesions of oral mucosa: Secondary | ICD-10-CM

## 2013-03-19 DIAGNOSIS — K1370 Unspecified lesions of oral mucosa: Secondary | ICD-10-CM

## 2013-03-19 DIAGNOSIS — R0989 Other specified symptoms and signs involving the circulatory and respiratory systems: Secondary | ICD-10-CM

## 2013-03-19 MED ORDER — NYSTATIN 100000 UNIT/ML MT SUSP: 5.0000 mL | Freq: Four times a day (QID) | OROMUCOSAL | Status: DC

## 2013-03-19 NOTE — Progress Notes (Signed)
Pre-visit discussion using our clinic review tool. No additional management support is needed unless otherwise documented below in the visit note.  

## 2013-03-19 NOTE — Assessment & Plan Note (Signed)
There was concern that patient's symptoms were related to atrial flutter. I have reviewed her electrocardiogram from earlier today. This shows sinus bradycardia with artifact. There is no atrial flutter. The patient does state that her heart rate has been in the 30s at times on her monitor at home. I will place a Holter monitor to make sure that she is not becoming significantly bradycardic. Her heart rate today was 58 and she has no history of syncope.

## 2013-03-19 NOTE — Assessment & Plan Note (Signed)
Continue statin. followed by primary care.

## 2013-03-19 NOTE — Patient Instructions (Signed)
Your physician recommends that you schedule a follow-up appointment in: 4 WEEKS WITH DR Jens Som IN [facility]  Your physician has recommended that you wear a 48 HOUR holter monitor. Holter monitors are medical devices that record the heart's electrical activity. Doctors most often use these monitors to diagnose arrhythmias. Arrhythmias are problems with the speed or rhythm of the heartbeat. The monitor is a small, portable device. You can wear one while you do your normal daily activities. This is usually used to diagnose what is causing palpitations/syncope (passing out).

## 2013-03-19 NOTE — Assessment & Plan Note (Signed)
Patient's blood pressure has been running high. Have asked her to continue to follow this  And keep records. We will add additional medications as needed. I would be hesitant to increase her beta blocker as her heart rate is 58 today.

## 2013-03-19 NOTE — Progress Notes (Signed)
Subjective:     Amy Rodriguez is a 79 y.o. female who presents for evaluation of dizziness and mouth pain. Mouth pain started 4 da. She has recent history of taking azithromycin for bronchitis. Regarding dizzyness, she has been feeling poorly with lightheadedness for several days. Blood pressure at home has been unusually high 166/105 with low pulse of 39. She has Hx of SVT for which she takes atenolol.   The following portions of the patient's history were reviewed and updated as appropriate: allergies, current medications, past family history, past medical history, past social history, past surgical history and problem list.  Review of Systems Constitutional: negative for fevers and weight loss Ears, nose, mouth, throat, and face: mouth feels irritated, wears dentures Respiratory: negative for sputum, wheezing and recently treated for bronchitis. Cardiovascular: home blood pressures elevated, pulse low, feeling dizzy, denies cp, LE edema Gastrointestinal: negative    Objective:    BP 140/70  Pulse 62  Temp(Src) 97.7 F (36.5 C) (Oral)  Ht 5\' 2"  (1.575 m)  Wt 146 lb 12 oz (66.565 kg)  BMI 26.83 kg/m2  SpO2 96% General appearance: alert, cooperative, appears stated age, mild distress and looks tired Head: Normocephalic, without obvious abnormality, atraumatic Eyes: negative findings: lids and lashes normal and conjunctivae and sclerae normal Throat: dentures removed, mucosa appears slightly irritated-more red than expected. Lungs: clear to auscultation bilaterally Heart: irregularly irregular rhythm      Assessment:    Atrial fibrillation  flutter waves , alternating with SR on ecg Oral thrush  Plan:   1 will see Dr Jens Som this afternoon 2 nystatin suspension

## 2013-03-19 NOTE — Telephone Encounter (Signed)
Patient is coming in at 2:30 pm 03/19/13 for ov.

## 2013-03-19 NOTE — Assessment & Plan Note (Signed)
No recent symptoms. Continue beta blocker. 

## 2013-03-19 NOTE — Patient Instructions (Addendum)
I am not sure that you have thrush, but the medicine will treat it if you have it. Use for about 5 days or 1 day after your symptoms go away. Let us know if you are not feeling better. Your heart is not beating properly. Please see Dr Jens Som this afternoon.   Thrush, Adult  Amy Rodriguez is a yeast infection that develops in the mouth and throat and on the tongue. The medical term for this is oropharyngeal candidiasis, or OPC. Amy Rodriguez is most common in older adults, but it can occur at any age. Amy Rodriguez occurs when a yeast called candida grows out of control. Candida normally is present in small amounts in the mouth and on other mucous membranes. However, under certain circumstances, candida can grow rapidly, causing thrush. Amy Rodriguez can be a recurring problem for people who have chronic illnesses or who take medications that limit the body's ability to fight infection (weakened immune system). Since these people have difficulty fighting infections, the fungus that causes thrush can spread throughout the body. This can cause life-threatening blood or organ infections. CAUSES  Candida, the yeast that causes thrush, is normally present in small amounts in the mouth and on other mucous membranes. It usually causes no harm. However, when conditions are present that allow the yeast to grow uncontrolled, it invades surrounding tissues and becomes an infection. Amy Rodriguez is most commonly caused by the yeast Candida albicans. Less often, other forms of candida can lead to thrush. There are many types of bacteria in your mouth that normally control the growth of candida. Sometimes a new type of bacteria gets into your mouth and disrupts the balance of the germs already there. This can allow candida to overgrow. Other factors that increase your risk of developing thrush include:  An impaired ability to fight infection (weakened immune system). A normal immune system is usually strong enough to prevent candida from  overgrowing.  Older adults are more likely to develop thrush because they may have weaker immune systems.  People with human immunodeficiency virus (HIV) infection have a high likelihood of developing thrush. About 90% of people with HIV develop thrush at some point during the course of their disease.  People with diabetes are more likely to get thrush because high blood sugar levels promote overgrowth of the candida fungus.  A dry mouth (xerostomia). Dry mouth can result from overuse of mouthwashes or from certain conditions such as Sjgren's syndrome.  Pregnancy. Hormone changes during pregnancy can lead to thrush by altering the balance of bacteria in the mouth.  Poor dental care, especially in people who have false teeth.  The use of antibiotic medications. This may lead to thrush by changing the balance of bacteria in the mouth. SYMPTOMS  Thrush can be a mild infection that causes no symptoms. If symptoms develop, they may include the following:  A burning feeling in the mouth and throat. This can occur at the start of a thrush infection.  White patches that adhere to the mouth and tongue. The tissue around the patches may be red, raw, and painful. If rubbed (during tooth brushing, for example), the patches and the tissue of the mouth may bleed easily.  A bad taste in the mouth or difficulty tasting foods.  Cottony feeling in the mouth.  Sometimes pain during eating and swallowing. DIAGNOSIS  Your caregiver can usually diagnose thrush by exam. In addition to looking in your mouth, your caregiver will ask you questions about your health. TREATMENT  Medications that help  prevent the growth of fungi (antifungals) are the standard treatment for thrush. These medications are either applied directly to the affected area (topical) or swallowed (oral). Mild thrush In adults, mild cases of thrush may clear up with simple treatment that can be done at home. This treatment usually involves  using an antifungal mouth rinse or lozenges. Treatment usually lasts about 14 days. Moderate to severe thrush  More severe thrush infections that have spread to the esophagus are treated with an oral antifungal medication. A topical antifungal medication may also be used.  For some severe infections, a treatment period longer than 14 days may be needed.  Oral antifungal medications are almost never used during pregnancy because the fetus may be harmed. However, if a pregnant woman has a rare, severe thrush infection that has spread to her blood, oral antifungal medications may be used. In this case, the risk of harm to the mother and fetus from the severe thrush infection may be greater than the risk posed by the use of antifungal medications. Persistent or recurrent thrush Persistent (does not go away) or recurrent (keeps coming back) cases of thrush may:  Need to be treated twice as long as the symptoms last.  Require treatment with both oral and topical antifungal medications.  People with weakened immune systems can take an antifungal medication on a continuous basis to prevent thrush infections. It is important to treat conditions that make you more likely to get thrush, such as diabetes, human immunodeficiency virus (HIV), or cancer.  HOME CARE INSTRUCTIONS   If you are breast-feeding, you should clean your nipples with an antifungal medication, such as nystatin (Mycostatin). Dry your nipples after breast-feeding. Applying lanolin-containing body lotion may help relieve nipple soreness.  If you wear dentures and get thrush, remove dentures before going to bed, brush them vigorously, and soak in a solution of chlorhexidine gluconate or a product such as Polident or Efferdent.  Eating plain, unflavored yogurt that contains live cultures (check the label) can also help cure thrush. Yogurt helps healthy bacteria grow in the mouth. These bacteria stop the growth of the yeast that causes  thrush.  Adults can treat thrush at home with gentian violet (1%), a dye that kills bacteria and fungi. It is available without a prescription. If there is no known cause for the infection or if gentian violet does not cure the thrush, you need to see your caregiver. Comfort measures Measures can be taken to reduce the discomfort of thrush:  Drink cold liquids such as water or iced tea. Eat flavored ice treats or frozen juices.  Eat foods that are easy to swallow such as gelatin, ice cream, or custard.  If the patches are painful, try drinking from a straw.  Rinse your mouth several times a day with a warm saltwater rinse. You can make the saltwater mixture with 1 tsp (5 g) of salt in 8 fl oz (0.2 L) of warm water. PROGNOSIS   Most cases of thrush are mild and clear up with the use of an antifungal mouth rinse or lozenges. Very mild cases of thrush may clear up without medical treatment. It usually takes about 14 days of treatment with an oral antifungal medication to cure more severe thrush infections. In some cases, thrush may last several weeks even with treatment.  If thrush goes untreated and does not go away by itself, it can spread to other parts of the body.  Thrush can spread to the throat, the vagina, or the  skin. It rarely spreads to other organs of the body. Amy Rodriguez is more likely to recur (come back) in:  People who use inhaled corticosteroids to treat asthma.  People who take antibiotic medications for a long time.  People who have false teeth.  People who have a weakened immune system. RISKS AND COMPLICATIONS Complications related to thrush are rare in healthy people. There are several factors that can increase your risk of developing thrush. Age Older adults, especially those who have serious health problems, are more likely to develop thrush because their immune systems are likely to be weaker. Behavior  The yeast that causes thrush can be spread by oral  sex.  Heavy smoking can lower the body's ability to fight off infections. This makes thrush more likely to develop. Other conditions  False teeth (dentures), braces, or a retainer that irritates the mouth make it hard to keep the mouth clean. An unclean mouth is more likely to develop thrush than a clean mouth.  People with a weakened immune system, such as those who have diabetes or human immunodeficiency virus (HIV) or who are undergoing chemotherapy, have an increased risk for developing thrush. Medications Some medications can allow the fungus that causes thrush to grow uncontrolled. Common ones are:  Antibiotics, especially those that kill a wide range of organisms (broad-spectrum antibiotics), such as tetracycline commonly can cause thrush.  Birth control pills (oral contraceptives).  Medications that weaken the body's immune system, such as corticosteroids. Environment Exposure over time to certain environmental chemicals, such as benzene and pesticides, can weaken the body's immune system. This increases your risk for developing infections, including thrush. SEEK IMMEDIATE MEDICAL CARE IF:  Your symptoms are getting worse or are not improving within 7 days of starting treatment.  You have symptoms of spreading infection, such as white patches on the skin outside of the mouth.  You are nursing and you have redness and pain in the nipples in spite of home treatment or if you have burning pain in the nipple area when you nurse. Your baby's mouth should also be examined to determine whether thrush is causing your symptoms. Document Released: 12/28/2003 Document Revised: 06/26/2011 Document Reviewed: 11/04/2012 [facility] Patient Information 2014 [place], [place].

## 2013-03-19 NOTE — Progress Notes (Signed)
HPI: 79 year old female previously followed by Dr. Daleen Squibb for followup of SVT. Echocardiogram in 2006 showed normal LV function and trace mitral/tricuspid regurgitation. Nuclear study in August of 2013 showed an ejection fraction of 76% and normal perfusion. The patient was seen by primary care today. She was complaining of being lightheaded continuously for 3 days. An electrocardiogram was checked and felt possibly secondary to atrial flutter. Cardiology is asked to evaluate. Note she has had bronchitis recently which is slowly improving. There is no chest pain, symptoms of SVT, pedal edema or syncope. She does feel her heartbeat occasionally.  Current Outpatient Prescriptions  Medication Sig Dispense Refill  . atenolol (TENORMIN) 25 MG tablet Take 1 tablet (25 mg total) by mouth daily.  90 tablet  3  . Cholecalciferol (VITAMIN D3) 5000 UNITS CAPS Take 1 capsule by mouth daily. Take with food containing fats.  30 capsule    . diclofenac sodium (VOLTAREN) 1 % GEL Apply 2 g topically 4 (four) times daily.  1 Tube  2  . fexofenadine (ALLEGRA) 180 MG tablet Take 1 tablet (180 mg total) by mouth daily as needed. Allergies  90 tablet  3  . fluticasone (FLONASE) 50 MCG/ACT nasal spray Place 2 sprays into the nose as needed. Allergies      . levothyroxine (SYNTHROID, LEVOTHROID) 50 MCG tablet Take 1 tablet (50 mcg total) by mouth daily before breakfast.  30 tablet  1  . nystatin (MYCOSTATIN) 100000 UNIT/ML suspension Take 5 mLs (500,000 Units total) by mouth 4 (four) times daily. Swish and hold, then swallow.  60 mL  0  . omeprazole (PRILOSEC) 10 MG capsule Take 1 capsule (10 mg total) by mouth daily.  90 capsule  0  . simvastatin (ZOCOR) 10 MG tablet Take 1 tablet (10 mg total) by mouth every evening.  90 tablet  1   No current facility-administered medications for this visit.     Past Medical History  Diagnosis Date  . Supraventricular tachycardia, paroxysmal   . HTN (hypertension)   .  Hyperlipidemia   . Postmenopausal HRT (hormone replacement therapy)   . Other seborrheic keratosis   . Vaginal pruritus   . Urinary frequency   . Diverticulosis of colon   . Blood in stool   . Pain in joint, lower leg   . Osteopenia   . IBS (irritable bowel syndrome)   . Hypothyroidism     TSH 14.488 (11/2005)  . H pylori ulcer     not treated due to expense  10/2001  . PPD positive     history +PPD 1984, no treatment, no abnormal CXR  . Posterior vitreous detachment 1996  . Multiple pigmented nevi     last derm evaluation 01/2003  . Postmenopausal     s/p hysterectomy for h/o cervical cancer 1982 (both ovaries taken at that time) now on hormonal replacement   . Shortness of breath     with ambulation  . Environmental allergies   . Lactose intolerance   . GERD (gastroesophageal reflux disease)   . Hand dermatitis   . Cervical cancer     s/p hysterectomy/oop  . PONV (postoperative nausea and vomiting)     "and takes me a long time to come out under it" (02/08/2012)  . Pneumonia     "couple times in my lifetime" (02/08/2012)  . History of bronchitis     "w/a cold" (02/08/2012)  . Migraines     "get them very rarely now" (02/08/2012)  .  OA (osteoarthritis)     multiple sites  . Arthritis     sed rate 10, RF, CCP pending  . DDD (degenerative disc disease), lumbosacral   . Osteoporosis     "borderline" (02/08/2012)  . Depression     Due to husbands passing away 09/22/2011    Past Surgical History  Procedure Laterality Date  . Eye surgery      cataract removal right eye  . Colonoscopy    . Total abdominal hysterectomy w/ bilateral salpingoophorectomy  1982  . Cataract extraction w/ intraocular lens implant  ?1997    right  . Total knee arthroplasty  02/08/2012    Procedure: TOTAL KNEE ARTHROPLASTY;  Surgeon: Velna Ochs, MD;  Location: [facility] OR;  Service: Orthopedics;  Laterality: Left;  . Cardiovascular stress test  12/07/11    Normal nuclear stress test     History   Social History  . Marital Status: Married    Spouse Name: N/A    Number of Children: 3  . Years of Education: N/A   Occupational History  . retired     Engineering geologist   Social History Main Topics  . Smoking status: Former Smoker -- 1.00 packs/day for 25 years    Types: Cigarettes    Quit date: 04/17/1980  . Smokeless tobacco: Never Used     Comment: passive smoker, husband smoked  . Alcohol Use: No  . Drug Use: No  . Sexual Activity: No   Other Topics Concern  . Not on file   Social History Narrative   Lives with son, Karleen Hampshire who has medical problems-they help each other. Her husband died Oct 03, 2022 after 39 years of marriage.  She has 2 other sons that live within 2 hrs from her. She does not drive-she uses [facility]. She used to work in Engineering geologist, retired.     Tobacco: 25 pack yr hx, quit 1985.    ROS: recent bronchitis but hemoptysis, dysphasia, odynophagia, melena, hematochezia, dysuria, hematuria, rash, seizure activity, orthopnea, PND, pedal edema, claudication. Remaining systems are negative.  Physical Exam: Well-developed well-nourished in no acute distress.  Skin is warm and dry.  HEENT is normal.  Neck is supple.  Chest is clear to auscultation with normal expansion.  Cardiovascular exam is irregular Abdominal exam nontender or distended. No masses palpated. Extremities show no edema. neuro grossly intact  ECG sinus bradycardia, right bundle branch block, nonspecific ST changes.

## 2013-03-27 ENCOUNTER — Encounter (INDEPENDENT_AMBULATORY_CARE_PROVIDER_SITE_OTHER): Payer: Medicare Other

## 2013-03-27 ENCOUNTER — Encounter: Payer: Self-pay | Admitting: *Deleted

## 2013-03-27 DIAGNOSIS — R002 Palpitations: Secondary | ICD-10-CM

## 2013-03-27 DIAGNOSIS — R42 Dizziness and giddiness: Secondary | ICD-10-CM

## 2013-03-27 NOTE — Progress Notes (Signed)
[name], female   DOB: 1933/06/01, 79 y.o.   MRN: [id number] E-[facility] 48 hour holter monitor applied to patient.

## 2013-03-29 ENCOUNTER — Other Ambulatory Visit: Payer: Self-pay | Admitting: Family Medicine

## 2013-03-31 ENCOUNTER — Other Ambulatory Visit: Payer: Self-pay | Admitting: Cardiology

## 2013-04-01 ENCOUNTER — Other Ambulatory Visit: Payer: Self-pay | Admitting: *Deleted

## 2013-04-01 MED ORDER — OMEPRAZOLE 10 MG PO CPDR: 10.0000 mg | DELAYED_RELEASE_CAPSULE | Freq: Every day | ORAL | Status: DC

## 2013-04-08 ENCOUNTER — Telehealth: Payer: Self-pay | Admitting: *Deleted

## 2013-04-08 NOTE — Telephone Encounter (Signed)
Spoke with pt, aware monitor reviewed by dr Jens Som shows sinus with pac's, pvc's and couplets.

## 2013-05-02 ENCOUNTER — Encounter: Payer: Medicare Other | Admitting: Cardiology

## 2013-05-02 NOTE — Progress Notes (Signed)
HPI: FU SVT. Echocardiogram in 2006 showed normal LV function and trace mitral/tricuspid regurgitation. Nuclear study in August of 2013 showed an ejection fraction of 76% and normal perfusion. The patient was recently seen by primary care for dizziness and felt to by in atrial flutter; however review of ECG showed artifact. Holter monitor in Dec 2014 performed because of question bradycardia showed sinus with pacs, pvcs and couplets. Since I last saw her,    Current Outpatient Prescriptions  Medication Sig Dispense Refill  . atenolol (TENORMIN) 25 MG tablet Take 1 tablet (25 mg total) by mouth daily.  90 tablet  3  . Cholecalciferol (VITAMIN D3) 5000 UNITS CAPS Take 1 capsule by mouth daily. Take with food containing fats.  30 capsule    . diclofenac sodium (VOLTAREN) 1 % GEL Apply 2 g topically 4 (four) times daily.  1 Tube  2  . fexofenadine (ALLEGRA) 180 MG tablet Take 1 tablet (180 mg total) by mouth daily as needed. Allergies  90 tablet  3  . fluticasone (FLONASE) 50 MCG/ACT nasal spray Place 2 sprays into the nose as needed. Allergies      . levothyroxine (SYNTHROID, LEVOTHROID) 50 MCG tablet Take 1 tablet (50 mcg total) by mouth daily before breakfast.  30 tablet  1  . nystatin (MYCOSTATIN) 100000 UNIT/ML suspension Take 5 mLs (500,000 Units total) by mouth 4 (four) times daily. Swish and hold, then swallow.  60 mL  0  . omeprazole (PRILOSEC) 10 MG capsule Take 1 capsule (10 mg total) by mouth daily.  90 capsule  0  . simvastatin (ZOCOR) 10 MG tablet Take 1 tablet (10 mg total) by mouth every evening.  90 tablet  1   No current facility-administered medications for this visit.     Past Medical History  Diagnosis Date  . Supraventricular tachycardia, paroxysmal   . HTN (hypertension)   . Hyperlipidemia   . Postmenopausal HRT (hormone replacement therapy)   . Other seborrheic keratosis   . Vaginal pruritus   . Urinary frequency   . Diverticulosis of colon   . Blood in  stool   . Pain in joint, lower leg   . Osteopenia   . IBS (irritable bowel syndrome)   . Hypothyroidism     TSH 14.488 (11/2005)  . H pylori ulcer     not treated due to expense  10/2001  . PPD positive     history +PPD 1984, no treatment, no abnormal CXR  . Posterior vitreous detachment 1996  . Multiple pigmented nevi     last derm evaluation 01/2003  . Postmenopausal     s/p hysterectomy for h/o cervical cancer 1982 (both ovaries taken at that time) now on hormonal replacement   . Shortness of breath     with ambulation  . Environmental allergies   . Lactose intolerance   . GERD (gastroesophageal reflux disease)   . Hand dermatitis   . Cervical cancer     s/p hysterectomy/oop  . PONV (postoperative nausea and vomiting)     "and takes me a long time to come out under it" (02/08/2012)  . Pneumonia     "couple times in my lifetime" (02/08/2012)  . History of bronchitis     "w/a cold" (02/08/2012)  . Migraines     "get them very rarely now" (02/08/2012)  . OA (osteoarthritis)     multiple sites  . Arthritis     sed rate 10, RF, CCP pending  .  DDD (degenerative disc disease), lumbosacral   . Osteoporosis     "borderline" (02/08/2012)  . Depression     Due to husbands passing away 09/22/2011    Past Surgical History  Procedure Laterality Date  . Eye surgery      cataract removal right eye  . Colonoscopy    . Total abdominal hysterectomy w/ bilateral salpingoophorectomy  1982  . Cataract extraction w/ intraocular lens implant  ?1997    right  . Total knee arthroplasty  02/08/2012    Procedure: TOTAL KNEE ARTHROPLASTY;  Surgeon: Hessie Dibble, MD;  Location: [facility];  Service: Orthopedics;  Laterality: Left;  . Cardiovascular stress test  12/07/11    Normal nuclear stress test    History   Social History  . Marital Status: Married    Spouse Name: N/A    Number of Children: 3  . Years of Education: N/A   Occupational History  . retired     Scientist, research (medical)   Social  History Main Topics  . Smoking status: Former Smoker -- 1.00 packs/day for 25 years    Types: Cigarettes    Quit date: 04/17/1980  . Smokeless tobacco: Never Used     Comment: passive smoker, husband smoked  . Alcohol Use: No  . Drug Use: No  . Sexual Activity: No   Other Topics Concern  . Not on file   Social History Narrative   Lives with son, [name] who has medical problems-they help each other. Her husband died 10/11/2022 after 83 years of marriage.  She has 2 other sons that live within 2 hrs from her. She does not drive-she uses [facility]. She used to work in Scientist, research (medical), retired.     Tobacco: 25 pack yr hx, quit 1985.    ROS: no fevers or chills, productive cough, hemoptysis, dysphasia, odynophagia, melena, hematochezia, dysuria, hematuria, rash, seizure activity, orthopnea, PND, pedal edema, claudication. Remaining systems are negative.  Physical Exam: Well-developed well-nourished in no acute distress.  Skin is warm and dry.  HEENT is normal.  Neck is supple.  Chest is clear to auscultation with normal expansion.  Cardiovascular exam is regular rate and rhythm.  Abdominal exam nontender or distended. No masses palpated. Extremities show no edema. neuro grossly intact  ECG     This encounter was created in error - please disregard.

## 2013-05-20 ENCOUNTER — Other Ambulatory Visit: Payer: Self-pay | Admitting: Family Medicine

## 2013-05-23 ENCOUNTER — Other Ambulatory Visit: Payer: Self-pay | Admitting: *Deleted

## 2013-05-23 DIAGNOSIS — E039 Hypothyroidism, unspecified: Secondary | ICD-10-CM

## 2013-05-23 MED ORDER — LEVOTHYROXINE SODIUM 50 MCG PO TABS: 50.0000 ug | ORAL_TABLET | Freq: Every day | ORAL | Status: DC

## 2013-05-23 NOTE — Telephone Encounter (Signed)
Is this medication ok to fill. You have not filled this med for pt yet.

## 2013-05-30 ENCOUNTER — Other Ambulatory Visit: Payer: Self-pay | Admitting: Family Medicine

## 2013-06-03 ENCOUNTER — Ambulatory Visit: Payer: Medicare Other | Admitting: Cardiology

## 2013-06-17 ENCOUNTER — Encounter: Payer: Self-pay | Admitting: Nurse Practitioner

## 2013-06-17 ENCOUNTER — Ambulatory Visit (INDEPENDENT_AMBULATORY_CARE_PROVIDER_SITE_OTHER): Payer: Commercial Managed Care - HMO | Admitting: Nurse Practitioner

## 2013-06-17 VITALS — BP 130/64 | HR 41 | Temp 97.7°F | Ht 62.0 in | Wt 152.0 lb

## 2013-06-17 DIAGNOSIS — M899 Disorder of bone, unspecified: Secondary | ICD-10-CM

## 2013-06-17 DIAGNOSIS — E039 Hypothyroidism, unspecified: Secondary | ICD-10-CM

## 2013-06-17 DIAGNOSIS — I10 Essential (primary) hypertension: Secondary | ICD-10-CM

## 2013-06-17 DIAGNOSIS — M949 Disorder of cartilage, unspecified: Secondary | ICD-10-CM

## 2013-06-17 DIAGNOSIS — R7309 Other abnormal glucose: Secondary | ICD-10-CM

## 2013-06-17 DIAGNOSIS — E871 Hypo-osmolality and hyponatremia: Secondary | ICD-10-CM

## 2013-06-17 DIAGNOSIS — E559 Vitamin D deficiency, unspecified: Secondary | ICD-10-CM

## 2013-06-17 DIAGNOSIS — E785 Hyperlipidemia, unspecified: Secondary | ICD-10-CM

## 2013-06-17 DIAGNOSIS — M25569 Pain in unspecified knee: Secondary | ICD-10-CM

## 2013-06-17 LAB — MICROALBUMIN / CREATININE URINE RATIO
Creatinine,U: 68 mg/dL
Microalb Creat Ratio: 0.1 mg/g (ref 0.0–30.0)
Microalb, Ur: 0.1 mg/dL (ref 0.0–1.9)

## 2013-06-17 LAB — VITAMIN B12: VITAMIN B 12: 188 pg/mL — AB (ref 211–911)

## 2013-06-17 NOTE — Progress Notes (Signed)
Pre visit review using our clinic review tool, if applicable. No additional management support is needed unless otherwise documented below in the visit note. 

## 2013-06-17 NOTE — Patient Instructions (Addendum)
Please see diabetes [facility] nutrition center for education on best food choices. Also read below. Some food substitues are brown rice for white rice, sweet potatoes instead of white, whole wheat bread instead of white.  Please reschedule cardiology & dermatology appt. Consider discussing "rooster comb" shots with Dr Latanya Maudlin. Also you may use natural anti-inflammatories: Fresh ginger tea daily (grate 1-2 TBLS fresh ginger in & steep in hot water for 5 minutes); handful tart cherries or 1/2 cup tart cherry juice daily; 2-3 curmarin capsules daily. Start 1 supplement at a time & take for 2 weeks before adding a second or third, if needed. Do not restrict salt. You may drink 8 oz V-8 daily. Our office will call you with lab results. See you in 6 months or sooner if you need [place]. Great to see you!  Osteoarthritis Osteoarthritis is a disease that causes soreness and swelling (inflammation) of a joint. It occurs when the cartilage at the affected joint wears down. Cartilage acts as a cushion, covering the ends of bones where they meet to form a joint. Osteoarthritis is the most common form of arthritis. It often occurs in older people. The joints affected most often by this condition include those in the:  Ends of the fingers.  Thumbs.  Neck.  Lower back.  Knees.  Hips. CAUSES  Over time, the cartilage that covers the ends of bones begins to wear away. This causes bone to rub on bone, producing pain and stiffness in the affected joints.  RISK FACTORS Certain factors can increase your chances of having osteoarthritis, including:  Older age.  Excessive body weight.  Overuse of joints. SIGNS AND SYMPTOMS   Pain, swelling, and stiffness in the joint.  Over time, the joint may lose its normal shape.  Small deposits of bone (osteophytes) may grow on the edges of the joint.  Bits of bone or cartilage can break off and float inside the joint space. This may cause more pain and damage. DIAGNOSIS   Your health care provider will do a physical exam and ask about your symptoms. Various tests may be ordered, such as:  X-rays of the affected joint.  An MRI scan.  Blood tests to rule out other types of arthritis.  Joint fluid tests. This involves using a needle to draw fluid from the joint and examining the fluid under a microscope. TREATMENT  Goals of treatment are to control pain and improve joint function. Treatment plans may include:  A prescribed exercise program that allows for rest and joint relief.  A weight control plan.  Pain relief techniques, such as:  Properly applied heat and cold.  Electric pulses delivered to nerve endings under the skin (transcutaneous electrical nerve stimulation, TENS).  Massage.  Certain nutritional supplements.  Medicines to control pain, such as:  Acetaminophen.  Nonsteroidal anti-inflammatory drugs (NSAIDs), such as naproxen.  Narcotic or central-acting agents, such as tramadol.  Corticosteroids. These can be given orally or as an injection.  Surgery to reposition the bones and relieve pain (osteotomy) or to remove loose pieces of bone and cartilage. Joint replacement may be needed in advanced states of osteoarthritis. HOME CARE INSTRUCTIONS   Only take over-the-counter or prescription medicines as directed by your health care provider. Take all medicines exactly as instructed.  Maintain a healthy weight. Follow your health care provider's instructions for weight control. This may include dietary instructions.  Exercise as directed. Your health care provider can recommend specific types of exercise. These may include:  Strengthening exercises  These are done to strengthen the muscles that support joints affected by arthritis. They can be performed with weights or with exercise bands to add resistance.  Aerobic activities These are exercises, such as brisk walking or low-impact aerobics, that get your heart  pumping.  Range-of-motion activities These keep your joints limber.  Balance and agility exercises These help you maintain daily living skills.  Rest your affected joints as directed by your health care provider.  Follow up with your health care provider as directed. SEEK MEDICAL CARE IF:   Your skin turns red.  You develop a rash in addition to your joint pain.  You have worsening joint pain. SEEK IMMEDIATE MEDICAL CARE IF:  You have a significant loss of weight or appetite.  You have a fever along with joint or muscle aches.  You have night sweats. [place] of Arthritis and Musculoskeletal and Skin Diseases: www.niams.SouthExposed.es [employer] on Aging: http://kim-miller.com/ American College of Rheumatology: www.rheumatology.org Document Released: 04/03/2005 Document Revised: 01/22/2013 Document Reviewed: 12/09/2012 [facility] Patient Information 2014 [place], [place].    Basic Carbohydrate Counting Basic carbohydrate counting is a way to plan meals. It is done by counting the amount of carbohydrate in foods. Foods that have carbohydrates are starches (grains, beans, starchy vegetables) and sweets. Eating carbohydrates increases blood glucose (sugar) levels. People with diabetes use carbohydrate counting to help keep their blood glucose at a normal level.  COUNTING CARBOHYDRATES IN FOODS The first step in counting carbohydrates is to learn how many carbohydrate servings you should have in every meal. A dietitian can plan this for you. After learning the amount of carbohydrates to include in your meal plan, you can start to choose the carbohydrate-containing foods you want to eat.  There are 2 ways to identify the amount of carbohydrates in the foods you eat.  Read the Nutrition Facts panel on food labels. You need 2 pieces of information from the Nutrition Facts panel to count carbohydrates this way:  Serving size.  Total carbohydrate (in  grams). Decide how many servings you will be eating. If it is 1 serving, you will be eating the amount of carbohydrate listed on the panel. If you will be eating 2 servings, you will be eating double the amount of carbohydrate listed on the panel.   Learn serving sizes. A serving size of most carbohydrate-containing foods is about 15 grams (g). Listed below are single serving sizes of common carbohydrate-containing foods:  1 slice bread.   cup unsweetened, dry cereal.   cup hot cereal.   cup rice.   cup mashed potatoes.   cup pasta.  1 cup fresh fruit.   cup canned fruit.  1 cup milk (whole, 2%, or skim).   cup starchy vegetables (peas, corn, or potatoes). Counting carbohydrates this way is similar to looking on the Nutrition Facts panel. Decide how many servings you will eat first. Multiply the number of servings you eat by 15 g. For example, if you have 2 cups of strawberries, you had 2 servings. That means you had 30 g of carbohydrate (2 servings x 15 g = 30 g). CALCULATING CARBOHYDRATES IN A MEAL Sample dinner  3 oz chicken breast.   cup brown rice.   cup corn.  1 cup fat-free milk.  1 cup strawberries with sugar-free whipped topping. Carbohydrate calculation First, identify the foods that contain carbohydrate:  Rice.  Corn.  Milk.  Strawberries. Calculate the number of servings eaten:  2 servings rice.  1  serving corn.  1 serving milk.  1 serving strawberries. Multiply the number of servings by 15 g:  2 servings rice x 15 g = 30 g.  1 serving corn x 15 g = 15 g.  1 serving milk x 15 g = 15 g.  1 serving strawberries x 15 g = 15 g. Add the amounts to find the total carbohydrates eaten: 30 g + 15 g + 15 g + 15 g = 75 g carbohydrate eaten at dinner. Document Released: 04/03/2005 Document Revised: 06/26/2011 Document Reviewed: 02/17/2011 [facility] Patient Information 2014 [place], [place].

## 2013-06-18 ENCOUNTER — Ambulatory Visit: Payer: Medicare Other | Admitting: Nurse Practitioner

## 2013-06-18 ENCOUNTER — Telehealth: Payer: Self-pay | Admitting: Nurse Practitioner

## 2013-06-18 LAB — VITAMIN D 25 HYDROXY (VIT D DEFICIENCY, FRACTURES): Vit D, 25-Hydroxy: 41 ng/mL (ref 30–89)

## 2013-06-18 NOTE — Telephone Encounter (Signed)
Relevant patient education mailed to patient.  

## 2013-06-19 DIAGNOSIS — R7309 Other abnormal glucose: Secondary | ICD-10-CM | POA: Insufficient documentation

## 2013-06-19 NOTE — Assessment & Plan Note (Signed)
Encouraged cut out sugar & white flour. Refer to [facility] for diabetes prevention.

## 2013-06-19 NOTE — Progress Notes (Signed)
Subjective:     Amy Rodriguez is a 80 y.o. female. She presents for follow up for multiple complaints: hyperlipidemia, hypothyroidism, osteopenia, R knee pain, elevated HgbA1C, & bradycardia (latter for which she was evaluated by cardiology).    The following portions of the patient's history were reviewed and updated as appropriate: allergies, current medications, past medical history, past social history, past surgical history and problem list.  Review of Systems Constitutional: negative Respiratory: negative for cough Cardiovascular: positive for irregular heart beat Gastrointestinal: negative for change in bowel habits Musculoskeletal:positive for R knee pain, interferes w/ADLs Endocrine: negative for diabetic symptoms including polydipsia, polyphagia and polyuria and temperature intolerance    Objective:    BP 130/64  Pulse 41  Temp(Src) 97.7 F (36.5 C) (Oral)  Ht 5\' 2"  (1.575 m)  Wt 152 lb (68.947 kg)  BMI 27.79 kg/m2  SpO2 94% BP 130/64  Pulse 41  Temp(Src) 97.7 F (36.5 C) (Oral)  Ht 5\' 2"  (1.575 m)  Wt 152 lb (68.947 kg)  BMI 27.79 kg/m2  SpO2 94% General appearance: alert, cooperative, appears stated age and no distress Head: Normocephalic, without obvious abnormality, atraumatic Eyes: negative findings: lids and lashes normal, conjunctivae and sclerae normal and wears glasses Lungs: clear to auscultation bilaterally Heart: bradycardia, irreg rhythm Abdomen: soft, non-tender; bowel sounds normal; no masses,  no organomegaly Extremities: extremities normal, atraumatic, no cyanosis or edema and Tender R knee at medial joint line Pulses: 2+ and symmetric Skin: Skin color, texture, turgor normal. No rashes or lesions or seborrheis keratosis L forehead at hairline. Appears she scratched it. No bleeding.    Assessment:     1. Unspecified vitamin D deficiency   2. Elevated hemoglobin A1c   3. Essential hypertension, benign   4. HYPERLIPIDEMIA   5. Hyponatremia    6. PATELLO FEMORAL STRESS SYNDROME   7. OSTEOPENIA   8. Hypothyroidism         Plan:     See prob list for plan.

## 2013-06-19 NOTE — Assessment & Plan Note (Signed)
Pt c/o R knee pain. Exercise makes pain worse, interferes with ADLs.  Eval by ortho-steroid inj helped for a while. Recmd see ortho again. Perhaps rooster comb injections will help. Pt does not want another knee replcmt.

## 2013-06-19 NOTE — Assessment & Plan Note (Signed)
Well controlled on 10 mg zocor. Tolerating med without SE. Will continue.

## 2013-06-19 NOTE — Assessment & Plan Note (Signed)
Pt uses salt substitute. Was advised years ago to not salt food. Advised no salt restriction. Drink 1 cup V-8 juice daily. Adv S&S low sodium.

## 2013-06-19 NOTE — Assessment & Plan Note (Signed)
Check level today. Will adjust supplement as needed.

## 2013-06-19 NOTE — Assessment & Plan Note (Signed)
Sent pt to PT for osteoporosis prevention. Exercises increased knee pain. Taking Vitamin D & calcium. Does not wish to take meds.

## 2013-06-19 NOTE — Assessment & Plan Note (Addendum)
Last TSH 12/2012. NMl range Will check again in 6 mos (yearly) as no med changes in 3 years. No s&s hypo or hyper thyroid. Continue synthroid at 50 mcg.

## 2013-07-25 ENCOUNTER — Other Ambulatory Visit: Payer: Self-pay

## 2013-07-25 ENCOUNTER — Observation Stay (HOSPITAL_COMMUNITY)
Admission: EM | Admit: 2013-07-25 | Discharge: 2013-07-27 | Disposition: A | Discharge status: Home / Self Care | Payer: Medicare HMO | Attending: Internal Medicine | Admitting: Internal Medicine

## 2013-07-25 ENCOUNTER — Encounter (HOSPITAL_COMMUNITY): Payer: Self-pay | Admitting: Emergency Medicine

## 2013-07-25 ENCOUNTER — Emergency Department (HOSPITAL_COMMUNITY): Payer: Medicare HMO

## 2013-07-25 ENCOUNTER — Telehealth: Payer: Self-pay | Admitting: Nurse Practitioner

## 2013-07-25 DIAGNOSIS — E871 Hypo-osmolality and hyponatremia: Secondary | ICD-10-CM

## 2013-07-25 DIAGNOSIS — E039 Hypothyroidism, unspecified: Secondary | ICD-10-CM | POA: Diagnosis present

## 2013-07-25 DIAGNOSIS — I471 Supraventricular tachycardia, unspecified: Secondary | ICD-10-CM | POA: Diagnosis present

## 2013-07-25 DIAGNOSIS — Z87891 Personal history of nicotine dependence: Secondary | ICD-10-CM | POA: Insufficient documentation

## 2013-07-25 DIAGNOSIS — R42 Dizziness and giddiness: Secondary | ICD-10-CM

## 2013-07-25 DIAGNOSIS — M899 Disorder of bone, unspecified: Secondary | ICD-10-CM | POA: Insufficient documentation

## 2013-07-25 DIAGNOSIS — R350 Frequency of micturition: Secondary | ICD-10-CM | POA: Insufficient documentation

## 2013-07-25 DIAGNOSIS — K219 Gastro-esophageal reflux disease without esophagitis: Secondary | ICD-10-CM | POA: Diagnosis not present

## 2013-07-25 DIAGNOSIS — F329 Major depressive disorder, single episode, unspecified: Secondary | ICD-10-CM | POA: Diagnosis not present

## 2013-07-25 DIAGNOSIS — M159 Polyosteoarthritis, unspecified: Secondary | ICD-10-CM | POA: Insufficient documentation

## 2013-07-25 DIAGNOSIS — L821 Other seborrheic keratosis: Secondary | ICD-10-CM | POA: Insufficient documentation

## 2013-07-25 DIAGNOSIS — M1990 Unspecified osteoarthritis, unspecified site: Secondary | ICD-10-CM

## 2013-07-25 DIAGNOSIS — R7309 Other abnormal glucose: Secondary | ICD-10-CM

## 2013-07-25 DIAGNOSIS — I10 Essential (primary) hypertension: Secondary | ICD-10-CM | POA: Diagnosis not present

## 2013-07-25 DIAGNOSIS — R7611 Nonspecific reaction to tuberculin skin test without active tuberculosis: Secondary | ICD-10-CM | POA: Insufficient documentation

## 2013-07-25 DIAGNOSIS — Z8541 Personal history of malignant neoplasm of cervix uteri: Secondary | ICD-10-CM | POA: Diagnosis not present

## 2013-07-25 DIAGNOSIS — Z96659 Presence of unspecified artificial knee joint: Secondary | ICD-10-CM | POA: Insufficient documentation

## 2013-07-25 DIAGNOSIS — M949 Disorder of cartilage, unspecified: Secondary | ICD-10-CM

## 2013-07-25 DIAGNOSIS — G459 Transient cerebral ischemic attack, unspecified: Principal | ICD-10-CM | POA: Insufficient documentation

## 2013-07-25 DIAGNOSIS — Z9079 Acquired absence of other genital organ(s): Secondary | ICD-10-CM | POA: Diagnosis not present

## 2013-07-25 DIAGNOSIS — E559 Vitamin D deficiency, unspecified: Secondary | ICD-10-CM

## 2013-07-25 DIAGNOSIS — G43909 Migraine, unspecified, not intractable, without status migrainosus: Secondary | ICD-10-CM | POA: Insufficient documentation

## 2013-07-25 DIAGNOSIS — M170 Bilateral primary osteoarthritis of knee: Secondary | ICD-10-CM | POA: Diagnosis present

## 2013-07-25 DIAGNOSIS — Z8673 Personal history of transient ischemic attack (TIA), and cerebral infarction without residual deficits: Secondary | ICD-10-CM | POA: Diagnosis present

## 2013-07-25 DIAGNOSIS — L299 Pruritus, unspecified: Secondary | ICD-10-CM | POA: Diagnosis not present

## 2013-07-25 DIAGNOSIS — M5137 Other intervertebral disc degeneration, lumbosacral region: Secondary | ICD-10-CM | POA: Insufficient documentation

## 2013-07-25 DIAGNOSIS — R0602 Shortness of breath: Secondary | ICD-10-CM

## 2013-07-25 DIAGNOSIS — R0609 Other forms of dyspnea: Secondary | ICD-10-CM

## 2013-07-25 DIAGNOSIS — R5383 Other fatigue: Secondary | ICD-10-CM

## 2013-07-25 DIAGNOSIS — M51379 Other intervertebral disc degeneration, lumbosacral region without mention of lumbar back pain or lower extremity pain: Secondary | ICD-10-CM | POA: Insufficient documentation

## 2013-07-25 DIAGNOSIS — F3289 Other specified depressive episodes: Secondary | ICD-10-CM | POA: Diagnosis not present

## 2013-07-25 DIAGNOSIS — Z90710 Acquired absence of both cervix and uterus: Secondary | ICD-10-CM | POA: Diagnosis not present

## 2013-07-25 DIAGNOSIS — E785 Hyperlipidemia, unspecified: Secondary | ICD-10-CM

## 2013-07-25 DIAGNOSIS — K5900 Constipation, unspecified: Secondary | ICD-10-CM

## 2013-07-25 DIAGNOSIS — E538 Deficiency of other specified B group vitamins: Secondary | ICD-10-CM | POA: Insufficient documentation

## 2013-07-25 DIAGNOSIS — K589 Irritable bowel syndrome without diarrhea: Secondary | ICD-10-CM | POA: Insufficient documentation

## 2013-07-25 DIAGNOSIS — K5730 Diverticulosis of large intestine without perforation or abscess without bleeding: Secondary | ICD-10-CM

## 2013-07-25 LAB — COMPREHENSIVE METABOLIC PANEL
ALT: 9 U/L (ref 0–35)
AST: 14 U/L (ref 0–37)
Albumin: 3.9 g/dL (ref 3.5–5.2)
Alkaline Phosphatase: 66 U/L (ref 39–117)
BUN: 9 mg/dL (ref 6–23)
CO2: 20 meq/L (ref 19–32)
Calcium: 9 mg/dL (ref 8.4–10.5)
Chloride: 96 mEq/L (ref 96–112)
Creatinine, Ser: 0.67 mg/dL (ref 0.50–1.10)
GFR calc Af Amer: >90 mL/min (ref >90)
GFR calc non Af Amer: 83 mL/min — ABNORMAL LOW (ref >90)
GLUCOSE: 136 mg/dL — AB (ref 70–99)
Potassium: 4.1 mEq/L (ref 3.7–5.3)
Sodium: 135 mEq/L — ABNORMAL LOW (ref 137–147)
TOTAL PROTEIN: 7.2 g/dL (ref 6.0–8.3)
Total Bilirubin: 0.3 mg/dL (ref 0.3–1.2)

## 2013-07-25 LAB — CBC
HEMATOCRIT: 37.2 % (ref 36.0–46.0)
Hemoglobin: 13 g/dL (ref 12.0–15.0)
MCH: 29.1 pg (ref 26.0–34.0)
MCHC: 34.9 g/dL (ref 30.0–36.0)
MCV: 83.2 fL (ref 78.0–100.0)
Platelets: 201 10*3/uL (ref 150–400)
RBC: 4.47 MIL/uL (ref 3.87–5.11)
RDW: 12.4 % (ref 11.5–15.5)
WBC: 8 10*3/uL (ref 4.0–10.5)

## 2013-07-25 LAB — PROTIME-INR
INR: 1.05 (ref 0.00–1.49)
PROTHROMBIN TIME: 13.5 s (ref 11.6–15.2)

## 2013-07-25 LAB — I-STAT TROPONIN, ED: Troponin i, poc: 0 ng/mL (ref 0.00–0.08)

## 2013-07-25 MED ORDER — ASPIRIN 81 MG PO CHEW
324.0000 mg | CHEWABLE_TABLET | Freq: Once | ORAL | Status: AC
  Administered 2013-07-25: 324 mg via ORAL
  Filled 2013-07-25: qty 4

## 2013-07-25 MED ORDER — VITAMIN D3 125 MCG (5000 UT) PO CAPS: 1.0000 | ORAL_CAPSULE | Freq: Every day | ORAL | Status: DC

## 2013-07-25 MED ORDER — IBUPROFEN 200 MG PO TABS
200.0000 mg | ORAL_TABLET | Freq: Every evening | ORAL | Status: DC | PRN
  Filled 2013-07-25: qty 1

## 2013-07-25 MED ORDER — VITAMIN B-12 100 MCG PO TABS
100.0000 ug | ORAL_TABLET | Freq: Every day | ORAL | Status: DC
  Administered 2013-07-26 – 2013-07-27 (×2): 100 ug via ORAL
  Filled 2013-07-25 (×3): qty 1

## 2013-07-25 MED ORDER — ASPIRIN 325 MG PO TABS
325.0000 mg | ORAL_TABLET | Freq: Every day | ORAL | Status: DC
  Administered 2013-07-26 – 2013-07-27 (×2): 325 mg via ORAL
  Filled 2013-07-25 (×2): qty 1

## 2013-07-25 MED ORDER — PANTOPRAZOLE SODIUM 40 MG PO TBEC
40.0000 mg | DELAYED_RELEASE_TABLET | Freq: Every day | ORAL | Status: DC
  Administered 2013-07-25 – 2013-07-26 (×2): 40 mg via ORAL
  Filled 2013-07-25 (×2): qty 1

## 2013-07-25 MED ORDER — SIMVASTATIN 10 MG PO TABS
10.0000 mg | ORAL_TABLET | Freq: Every evening | ORAL | Status: DC
  Administered 2013-07-25 – 2013-07-26 (×2): 10 mg via ORAL
  Filled 2013-07-25 (×3): qty 1

## 2013-07-25 MED ORDER — ATENOLOL 12.5 MG HALF TABLET
12.5000 mg | ORAL_TABLET | Freq: Two times a day (BID) | ORAL | Status: DC
  Administered 2013-07-25: 12.5 mg via ORAL
  Filled 2013-07-25 (×3): qty 1

## 2013-07-25 MED ORDER — LEVOTHYROXINE SODIUM 50 MCG PO TABS
50.0000 ug | ORAL_TABLET | Freq: Every day | ORAL | Status: DC
  Administered 2013-07-26: 50 ug via ORAL
  Filled 2013-07-25 (×2): qty 1

## 2013-07-25 MED ORDER — VITAMIN B-12 100 MCG PO TABS: 100.0000 ug | ORAL_TABLET | Freq: Every day | ORAL | Status: DC

## 2013-07-25 MED ORDER — LORATADINE 10 MG PO TABS
10.0000 mg | ORAL_TABLET | Freq: Every day | ORAL | Status: DC
  Administered 2013-07-26 – 2013-07-27 (×2): 10 mg via ORAL
  Filled 2013-07-25 (×3): qty 1

## 2013-07-25 MED ORDER — VITAMIN D3 25 MCG (1000 UNIT) PO TABS
1000.0000 [IU] | ORAL_TABLET | Freq: Every day | ORAL | Status: DC
  Administered 2013-07-26 – 2013-07-27 (×2): 1000 [IU] via ORAL
  Filled 2013-07-25 (×2): qty 1

## 2013-07-25 MED ORDER — ENOXAPARIN SODIUM 40 MG/0.4ML ~~LOC~~ SOLN
40.0000 mg | SUBCUTANEOUS | Status: DC
  Administered 2013-07-25 – 2013-07-26 (×2): 40 mg via SUBCUTANEOUS
  Filled 2013-07-25 (×4): qty 0.4

## 2013-07-25 MED ORDER — FLUTICASONE PROPIONATE 50 MCG/ACT NA SUSP
2.0000 | Freq: Every day | NASAL | Status: DC
  Filled 2013-07-25: qty 16

## 2013-07-25 NOTE — ED Notes (Signed)
Patient presents to ed c/o numbness in right thumb and index finger last pm, states numbness radiated up her right arm into right shoulder and right side of her face. Denies pain .......Marland KitchenStates she called 911 and was check out by ems , numbness had subsided and vs were good so she opted not to come to ed. States she called her MD today and was told to come to the ed for further eval. Only complains of very slight numbness in the tips of her right index and thumb. Bilateral grips equal.

## 2013-07-25 NOTE — ED Notes (Signed)
MD Plunkett aware of patient symptoms, orders received.

## 2013-07-25 NOTE — ED Provider Notes (Signed)
CSN: [id number]     Arrival date & time 07/25/13  1335 History   First MD Initiated Contact with Patient 07/25/13 1726     Chief Complaint  Patient presents with  . Transient Ischemic Attack     (Consider location/radiation/quality/duration/timing/severity/associated sxs/prior Treatment) HPI Comments: 80 year old female with lipids, high blood pressure, SVT, hypothyroidism, hyponatremia presents after episode of a right arm numbness lasting approximately one hour. This occurred last night around 9 PM and she was evaluated by EMS and vitals are normal and her symptoms had resolved. She came in today concerned that it may have been a TIA or small stroke. She's had no recurrent symptoms or new symptoms since. No history of stroke. Patient is not on blood thinners.  The history is provided by the patient.    Past Medical History  Diagnosis Date  . Supraventricular tachycardia, paroxysmal   . HTN (hypertension)   . Hyperlipidemia   . Postmenopausal HRT (hormone replacement therapy)   . Other seborrheic keratosis   . Vaginal pruritus   . Urinary frequency   . Diverticulosis of colon   . Blood in stool   . Pain in joint, lower leg   . Osteopenia   . IBS (irritable bowel syndrome)   . Hypothyroidism     TSH 14.488 (11/2005)  . H pylori ulcer     not treated due to expense  10/2001  . PPD positive     history +PPD 1984, no treatment, no abnormal CXR  . Posterior vitreous detachment 1996  . Multiple pigmented nevi     last derm evaluation 01/2003  . Postmenopausal     s/p hysterectomy for h/o cervical cancer 1982 (both ovaries taken at that time) now on hormonal replacement   . Shortness of breath     with ambulation  . Environmental allergies   . Lactose intolerance   . GERD (gastroesophageal reflux disease)   . Hand dermatitis   . Cervical cancer     s/p hysterectomy/oop  . PONV (postoperative nausea and vomiting)     "and takes me a long time to come out under it" (02/08/2012)   . Pneumonia     "couple times in my lifetime" (02/08/2012)  . History of bronchitis     "w/a cold" (02/08/2012)  . Migraines     "get them very rarely now" (02/08/2012)  . OA (osteoarthritis)     multiple sites  . Arthritis     sed rate 10, RF, CCP pending  . DDD (degenerative disc disease), lumbosacral   . Osteoporosis     "borderline" (02/08/2012)  . Depression     Due to husbands passing away 09/22/2011   Past Surgical History  Procedure Laterality Date  . Eye surgery      cataract removal right eye  . Colonoscopy    . Total abdominal hysterectomy w/ bilateral salpingoophorectomy  1982  . Cataract extraction w/ intraocular lens implant  ?1997    right  . Total knee arthroplasty  02/08/2012    Procedure: TOTAL KNEE ARTHROPLASTY;  Surgeon: Hessie Dibble, MD;  Location: [facility];  Service: Orthopedics;  Laterality: Left;  . Cardiovascular stress test  12/07/11    Normal nuclear stress test   Family History  Problem Relation Age of Onset  . Breast cancer Mother 22  . Lung cancer Brother 64  . Cancer Brother     lung  . Heart failure Father     congestive  . Heart disease Father   .  Epilepsy Son   . Kidney disease Son     tuberous sclerosis, both kidneys removed, has transplant  . Diabetes Maternal Uncle   . Diabetes Paternal Aunt   . Cancer Maternal Grandmother     breast  . Heart disease Son     [place] white  . COPD Son    History  Substance Use Topics  . Smoking status: Former Smoker -- 1.00 packs/day for 25 years    Types: Cigarettes    Quit date: 04/17/1980  . Smokeless tobacco: Never Used     Comment: passive smoker, husband smoked  . Alcohol Use: No   OB History   Grav Para Term Preterm Abortions TAB SAB Ect Mult Living                 Review of Systems  Constitutional: Negative for fever and chills.  HENT: Negative for congestion.   Eyes: Negative for visual disturbance.  Respiratory: Negative for shortness of breath.   Cardiovascular:  Negative for chest pain.  Gastrointestinal: Negative for vomiting and abdominal pain.  Genitourinary: Negative for dysuria and flank pain.  Musculoskeletal: Negative for back pain, neck pain and neck stiffness.  Skin: Negative for rash.  Neurological: Positive for numbness. Negative for speech difficulty, weakness, light-headedness and headaches.      Allergies  Benzonatate; Doxycycline; Sulfonamide derivatives; and Amlodipine  Home Medications   Current Outpatient Rx  Name  Route  Sig  Dispense  Refill  . atenolol (TENORMIN) 25 MG tablet   Oral   Take 1 tablet (25 mg total) by mouth daily.   90 tablet   3     This is a 90 day supply   . Cholecalciferol (VITAMIN D3) 5000 UNITS CAPS   Oral   Take 1 capsule by mouth daily. Take with food containing fats.   30 capsule      . diclofenac sodium (VOLTAREN) 1 % GEL   Topical   Apply 2 g topically 4 (four) times daily.   1 Tube   2   . fexofenadine (ALLEGRA) 180 MG tablet   Oral   Take 1 tablet (180 mg total) by mouth daily as needed. Allergies   90 tablet   3   . fluticasone (FLONASE) 50 MCG/ACT nasal spray   Nasal   Place 2 sprays into the nose as needed. Allergies         . levothyroxine (SYNTHROID, LEVOTHROID) 50 MCG tablet   Oral   Take 1 tablet (50 mcg total) by mouth daily before breakfast.   30 tablet   1   . omeprazole (PRILOSEC) 10 MG capsule   Oral   Take 1 capsule (10 mg total) by mouth daily.   90 capsule   0   . simvastatin (ZOCOR) 10 MG tablet   Oral   Take 1 tablet (10 mg total) by mouth every evening.   90 tablet   1    BP 172/75  Pulse 55  Temp(Src) 98.4 F (36.9 C) (Oral)  Resp 18  SpO2 96% Physical Exam  Nursing note and vitals reviewed. Constitutional: She is oriented to person, place, and time. She appears well-developed and well-nourished.  HENT:  Head: Normocephalic and atraumatic.  Eyes: Conjunctivae are normal. Right eye exhibits no discharge. Left eye exhibits no  discharge.  Neck: Normal range of motion. Neck supple. No tracheal deviation present.  Cardiovascular: Regular rhythm.  Bradycardia present.   Pulmonary/Chest: Effort normal and breath sounds normal.  Abdominal: Soft.  She exhibits no distension. There is no tenderness. There is no guarding.  Musculoskeletal: She exhibits no edema.  Neurological: She is alert and oriented to person, place, and time. GCS eye subscore is 4. GCS verbal subscore is 5. GCS motor subscore is 6.  5+ strength in UE and LE with f/e at major joints. Sensation to palpation intact in UE and LE. CNs 2-12 grossly intact.  EOMFI.  PERRL.   Finger nose and coordination intact bilateral.   Visual fields intact to finger testing. 5+ strength with right wrist extension and bicep flexion  Skin: Skin is warm. No rash noted.  Psychiatric: She has a normal mood and affect.    ED Course  Procedures (including critical care time) Labs Review Labs Reviewed  COMPREHENSIVE METABOLIC PANEL - Abnormal; Notable for the following:    Sodium 135 (*)    Glucose, Bld 136 (*)    GFR calc non Af Amer 83 (*)    All other components within normal limits  CBC  PROTIME-INR  I-STAT TROPOININ, ED   Imaging Review Ct Head Wo Contrast  07/25/2013   CLINICAL DATA:  TRANSIENT ISCHEMIC ATTACK  EXAM: CT HEAD WITHOUT CONTRAST  TECHNIQUE: Contiguous axial images were obtained from the base of the skull through the vertex without intravenous contrast.  COMPARISON:  None available for comparison at time of study interpretation.  FINDINGS: The ventricles and sulci are normal for age. No intraparenchymal hemorrhage, mass effect nor midline shift. Patchy supratentorial white matter hypodensities are less than expected for patient's age and though non-specific suggest sequelae of chronic small vessel ischemic disease. No acute large vascular territory infarcts.  No abnormal extra-axial fluid collections. Basal cisterns are patent. Mild calcific  atherosclerosis of the carotid siphons.  No skull fracture. Visualized paranasal sinuses and mastoid aircells are well-aerated. The included ocular globes and orbital contents are non-suspicious. Status post right ocular lens implant. 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  IMPRESSION: No acute intracranial process ; normal noncontrast CT of the head for age.If clinical concern for acute ischemia, MRI of the brain with diffusion-weighted sequences would be more sensitive.   Electronically Signed   By: Elon Alas   On: 07/25/2013 15:59     EKG Interpretation None      Date: 07/26/2013  Rate: 56  Rhythm: sinus bradycardia  QRS Axis: indeterminate  Intervals: normal  ST/T Wave abnormalities: nonspecific T wave changes  Conduction Disutrbances:right bundle branch block  Narrative Interpretation:   Old EKG Reviewed: unchanged   MDM   Final diagnoses:  TIA (transient ischemic attack)  Essential hypertension, benign  Hypothyroidism  Other and unspecified hyperlipidemia   Patient asymptomatic with normal neuro exam. With numbness extending from right hand all the way up to right face concern for TIA. CT no acute findings aspirin ordered. Page neurology and tri[facility] for observation for further delineation and cause of possible TIA.  [facility] agreed with observation for stroke/ tia workup.  The patients results and plan were reviewed and discussed.   Any x-rays performed were personally reviewed by myself.   Differential diagnosis were considered with the presenting HPI.   EKG: reviewed, similar to previous.  Filed Vitals:   07/25/13 1944 07/25/13 1945 07/25/13 2200 07/26/13 0000  BP:  170/65 178/68 111/61  Pulse:  54 53 65  Temp: 98.1 F (36.7 C)  97.7 F (36.5 C) 97.9 F (36.6 C)  TempSrc:   Oral Oral  Resp:  21 20 20   Height:   5'  2" (1.575 m)   Weight:   145 lb 11.6 oz (66.1 kg)   SpO2:  98% 97% 95%    Admission/ observation were discussed with  the admitting physician, patient and/or family and they are comfortable with the plan.      Mariea Clonts, MD 07/26/13 [phone]

## 2013-07-25 NOTE — Telephone Encounter (Signed)
Patient Information:  Caller Name: Barbara  Phone: [phone]  Patient: Amy Rodriguez, Amy Rodriguez  Gender: Female  DOB: [birth year]  Age: 80 Years  PCP: Nicky Pugh  Office Follow Up:  Does the office need to follow up with this patient?: No  Instructions For The Office: N/A   Symptoms  Reason For Call & Symptoms: Patient calling, "thought I was having a stroke last night".  Started with numbness in her right hand, right arm and face last night 4/9.  She felt like she was going to pass out so she sat down.  She did call 911 and they came to the home and told her it wasn't a stroke and her vital signs were good.  Offered to take her to the ED but she declined since her vital signs were normal and the numbness was subsiding.  She still has numbness in her finger times.   She had a h/a afterwards but that has subsided as well.  During the episode her hand "was floppy".  Has had pain in her right leg since last fall and has test scheduled for Monday 4/13.  Reviewed Health History In EMR: Yes  Reviewed Medications In EMR: Yes  Reviewed Allergies In EMR: Yes  Reviewed Surgeries / Procedures: Yes  Date of Onset of Symptoms: 07/24/2013  Guideline(s) Used:  Neurologic Deficit  Disposition Per Guideline:   Call EMS 911 Now  Reason For Disposition Reached:   Sounds like a life-threatening emergency to the triager  Advice Given:  N/A  Patient Will Follow Care Advice:  YES  She has no transportation.  She doesn't drive and her son that lives with her has medical issues and cannot drive and she said that she would have to call around to find someone to take her to the ED.  Advised not to do this, to call 911 and go to the ED to be seen asap.

## 2013-07-25 NOTE — H&P (Addendum)
[facility] History and Physical  Amy Rodriguez ZOX:[id number] DOB: Aug 04, 1933 DOA: 07/25/2013  Referring physician: Dr Reather Converse PCP: Irene Pap, NP   Chief Complaint:  Tingling and numbness of right hand  HPI:  80 y/o female with past medical hx of HTN, HL, Hypothyroidism , hx of SVT, GERD  presented to ED with acute onset of  numbness in right thumb and index finger last evening while she was at home. The numbness then radiated up her right arm into right shoulder and right side of her face ( lower lips) .this lasted for 30 minute and subsided on its own. She had some headache on the right side and felt like she may pass out. She does have occasional migraine headaches but these symptoms were nowhere similar to it. Denies any weakness or slurred speech. She does report off and on tinnitus for past few months. Denies recent travel or change in medications.  Patient denies dizziness, fever, chills, nausea , vomiting, chest pain, palpitations, SOB, abdominal pain, bowel or urinary symptoms. Denies change in weight or appetite.denies similar symptoms in past.   She called EMS and by the time they arrived symptoms had resolved. She called her PCP this morning who suggested her to come to the ED.  In the ED Patient given a dose of ASA 324 mg. vitals and labs were stable. CT head was unremarkable. Given age and risk factors, patient being admitted under obs to r/o TIA  neurologist being called by ED physician.   Review of Systems:  Constitutional: Denies fever, chills, diaphoresis, appetite change and fatigue.  HEENT: Denies photophobia, eye pain, redness, hearing loss, ear pain, congestion, sore throat, rhinorrhea, sneezing, mouth sores, trouble swallowing, neck pain, neck stiffness, tinnitus.   Respiratory: Denies SOB, DOE, cough, chest tightness,  and wheezing.   Cardiovascular: Denies chest pain, palpitations and leg swelling.  Gastrointestinal: Denies nausea, vomiting, abdominal  pain, diarrhea, constipation, blood in stool and abdominal distention.  Genitourinary: Denies dysuria, urgency, frequency, hematuria, flank pain and difficulty urinating.  Endocrine: Denies: hot or cold intolerance,  polyuria, polydipsia. Musculoskeletal: Denies myalgias, back pain, joint swelling, arthralgias and gait problem.  Skin: Denies pallor, rash and wound.  Neurological: lightheadedness, headache, numbness, Denies dizziness, seizures, syncope, weakness, light-headedness Psychiatric/Behavioral: Denies confusion,     Past Medical History  Diagnosis Date  . Supraventricular tachycardia, paroxysmal   . HTN (hypertension)   . Hyperlipidemia   . Postmenopausal HRT (hormone replacement therapy)   . Other seborrheic keratosis   . Vaginal pruritus   . Urinary frequency   . Diverticulosis of colon   . Blood in stool   . Pain in joint, lower leg   . Osteopenia   . IBS (irritable bowel syndrome)   . Hypothyroidism     TSH 14.488 (11/2005)  . H pylori ulcer     not treated due to expense  10/2001  . PPD positive     history +PPD 1984, no treatment, no abnormal CXR  . Posterior vitreous detachment 1996  . Multiple pigmented nevi     last derm evaluation 01/2003  . Postmenopausal     s/p hysterectomy for h/o cervical cancer 1982 (both ovaries taken at that time) now on hormonal replacement   . Shortness of breath     with ambulation  . Environmental allergies   . Lactose intolerance   . GERD (gastroesophageal reflux disease)   . Hand dermatitis   . Cervical cancer     s/p hysterectomy/oop  .  PONV (postoperative nausea and vomiting)     "and takes me a long time to come out under it" (02/08/2012)  . Pneumonia     "couple times in my lifetime" (02/08/2012)  . History of bronchitis     "w/a cold" (02/08/2012)  . Migraines     "get them very rarely now" (02/08/2012)  . OA (osteoarthritis)     multiple sites  . Arthritis     sed rate 10, RF, CCP pending  . DDD (degenerative  disc disease), lumbosacral   . Osteoporosis     "borderline" (02/08/2012)  . Depression     Due to husbands passing away 09/22/2011   Past Surgical History  Procedure Laterality Date  . Eye surgery      cataract removal right eye  . Colonoscopy    . Total abdominal hysterectomy w/ bilateral salpingoophorectomy  1982  . Cataract extraction w/ intraocular lens implant  ?1997    right  . Total knee arthroplasty  02/08/2012    Procedure: TOTAL KNEE ARTHROPLASTY;  Surgeon: Hessie Dibble, MD;  Location: [facility];  Service: Orthopedics;  Laterality: Left;  . Cardiovascular stress test  12/07/11    Normal nuclear stress test   Social History:  reports that she quit smoking about 33 years ago. Her smoking use included Cigarettes. She has a 25 pack-year smoking history. She has never used smokeless tobacco. She reports that she does not drink alcohol or use illicit drugs.  Allergies  Allergen Reactions  . Benzonatate Nausea And Vomiting    "tessalon pearl"  . Doxycycline Nausea And Vomiting  . Sulfonamide Derivatives Itching and Rash  . Amlodipine Other (See Comments)    Heart palpitations     Family History  Problem Relation Age of Onset  . Breast cancer Mother 76  . Lung cancer Brother 39  . Cancer Brother     lung  . Heart failure Father     congestive  . Heart disease Father   . Epilepsy Son   . Kidney disease Son     tuberous sclerosis, both kidneys removed, has transplant  . Diabetes Maternal Uncle   . Diabetes Paternal Aunt   . Cancer Maternal Grandmother     breast  . Heart disease Son     [place] white  . COPD Son     Prior to Admission medications   Medication Sig Start Date End Date Taking? Authorizing Provider  Cholecalciferol (VITAMIN D3) 5000 UNITS CAPS Take 1 capsule by mouth daily. Take with food containing fats. 12/18/12   Irene Pap, NP  fexofenadine (ALLEGRA) 180 MG tablet Take 1 tablet (180 mg total) by mouth daily as needed. Allergies 11/28/12    Josalyn C Funches, MD  fluticasone (FLONASE) 50 MCG/ACT nasal spray Place 2 sprays into the nose as needed. Allergies    Historical Provider, MD  levothyroxine (SYNTHROID, LEVOTHROID) 50 MCG tablet Take 1 tablet (50 mcg total) by mouth daily before breakfast. 05/23/13   Irene Pap, NP  omeprazole (PRILOSEC) 10 MG capsule Take 1 capsule (10 mg total) by mouth daily. 04/01/13   Irene Pap, NP  simvastatin (ZOCOR) 10 MG tablet Take 1 tablet (10 mg total) by mouth every evening. 01/16/13   Irene Pap, NP     Physical Exam:  Filed Vitals:   07/25/13 1357  BP: 172/75  Pulse: 55  Temp: 98.4 F (36.9 C)  TempSrc: Oral  Resp: 18  SpO2: 96%    Constitutional: Vital  signs reviewed.  Patient is an elderly female  in no acute distress. HEENT: no pallor, no icterus, moist oral mucosa, no cervical lymphadenopathy Cardiovascular: RRR, S1 normal, S2 normal, no MRG Chest: CTAB, no wheezes, rales, or rhonchi Abdominal: Soft. Non-tender, non-distended, bowel sounds are normal, no masses, organomegaly, or guarding present.  GU: no CVA tenderness Ext: warm, no edema Neurological: A&O x3,cranial nerves 2-12 intact, normal motor tone , power and reflexes. Normal sensation b/l.  non focal   Labs on Admission:  Basic Metabolic Panel:  Recent Labs Lab 07/25/13 1401  NA 135*  K 4.1  CL 96  CO2 20  GLUCOSE 136*  BUN 9  CREATININE 0.67  CALCIUM 9.0   Liver Function Tests:  Recent Labs Lab 07/25/13 1401  AST 14  ALT 9  ALKPHOS 66  BILITOT 0.3  PROT 7.2  ALBUMIN 3.9   No results found for this basename: LIPASE, AMYLASE,  in the last 168 hours No results found for this basename: AMMONIA,  in the last 168 hours CBC:  Recent Labs Lab 07/25/13 1401  WBC 8.0  HGB 13.0  HCT 37.2  MCV 83.2  PLT 201   Cardiac Enzymes: No results found for this basename: CKTOTAL, CKMB, CKMBINDEX, TROPONINI,  in the last 168 hours BNP: No components found with this basename: POCBNP,  CBG: No  results found for this basename: GLUCAP,  in the last 168 hours  Radiological Exams on Admission: Ct Head Wo Contrast  07/25/2013   CLINICAL DATA:  TRANSIENT ISCHEMIC ATTACK  EXAM: CT HEAD WITHOUT CONTRAST  TECHNIQUE: Contiguous axial images were obtained from the base of the skull through the vertex without intravenous contrast.  COMPARISON:  None available for comparison at time of study interpretation.  FINDINGS: The ventricles and sulci are normal for age. No intraparenchymal hemorrhage, mass effect nor midline shift. Patchy supratentorial white matter hypodensities are less than expected for patient's age and though non-specific suggest sequelae of chronic small vessel ischemic disease. No acute large vascular territory infarcts.  No abnormal extra-axial fluid collections. Basal cisterns are patent. Mild calcific atherosclerosis of the carotid siphons.  No skull fracture. Visualized paranasal sinuses and mastoid aircells are well-aerated. The included ocular globes and orbital contents are non-suspicious. Status post right ocular lens implant. 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  IMPRESSION: No acute intracranial process ; normal noncontrast CT of the head for age.If clinical concern for acute ischemia, MRI of the brain with diffusion-weighted sequences would be more sensitive.   Electronically Signed   By: Elon Alas   On: 07/25/2013 15:59    EKG: sinus brady @56 , no ST-T changes  Assessment/Plan Principal Problem:   TIA (transient ischemic attack)   Active Problems:   HYPERLIPIDEMIA   HYPERTENSION, BENIGN ESSENTIAL   TACHYCARDIA, PAROXYSMAL SUPRAVENTRICULAR   OSTEOARTHRITIS, MULTI SITES   Hypothyroidism   TIA ? CVA Admit to telemetry under obs  neuro checks q4 hr Head CT on admission negative for acute event. Ordered MRI brain, MRA head. -Check 2D echo, carotid doppler, A1C and lipid panel - PT, OT. -ASA 325 mg daily, resume statin. Allow permissive  HTN Neurology consulted by ED phyasician -Appreciate neurology recommendations.  Hyperlipidemia  continue statin. Check lipid panel in am  HTN Stable Continue atenolol  hypothyroidism  continue synthroid. check TSH  Hx of SVT followed by cardiology. Had normal 48 hr Holter monitoring in 03/2013  b12 deficiency On supplements    Diet:cardiac  DVT prophylaxis: sq lovenox   Code  Status: full code Family Communication: discussed with patient Disposition Plan: home once improved  Amy Rodriguez [facility] Pager [phone]  Total time spent on admission :70 minutes  If 7PM-7AM, please contact night-coverage [email or web address] Password [facility] [place] [facility] 07/25/2013, 5:52 PM

## 2013-07-25 NOTE — ED Notes (Signed)
Pt in c/o episode of right arm and face numbness that occurred last night around 830pm, states symptoms lasted around 30 mins and resolved, states they have not returned since that time, denies numbness at this time, no neuro deficits noted, pt c/o mild headache, alert and oriented

## 2013-07-26 ENCOUNTER — Observation Stay (HOSPITAL_COMMUNITY): Payer: Medicare HMO

## 2013-07-26 DIAGNOSIS — M1990 Unspecified osteoarthritis, unspecified site: Secondary | ICD-10-CM

## 2013-07-26 DIAGNOSIS — I519 Heart disease, unspecified: Secondary | ICD-10-CM

## 2013-07-26 DIAGNOSIS — E559 Vitamin D deficiency, unspecified: Secondary | ICD-10-CM

## 2013-07-26 DIAGNOSIS — G459 Transient cerebral ischemic attack, unspecified: Secondary | ICD-10-CM | POA: Diagnosis not present

## 2013-07-26 LAB — LIPID PANEL
Cholesterol: 143 mg/dL (ref 0–200)
HDL: 45 mg/dL (ref >39)
LDL CALC: 69 mg/dL (ref 0–99)
Total CHOL/HDL Ratio: 3.2 RATIO
Triglycerides: 147 mg/dL (ref <150)
VLDL: 29 mg/dL (ref 0–40)

## 2013-07-26 LAB — HEMOGLOBIN A1C
Hgb A1c MFr Bld: 5.8 % — ABNORMAL HIGH (ref <5.7)
Mean Plasma Glucose: 120 mg/dL — ABNORMAL HIGH (ref <117)

## 2013-07-26 LAB — TSH: TSH: 8.12 u[IU]/mL — ABNORMAL HIGH (ref 0.350–4.500)

## 2013-07-26 MED ORDER — LEVOTHYROXINE SODIUM 75 MCG PO TABS
75.0000 ug | ORAL_TABLET | Freq: Every day | ORAL | Status: DC
Start: 2013-07-27 — End: 2013-07-27
  Administered 2013-07-27: 75 ug via ORAL
  Filled 2013-07-26 (×3): qty 1

## 2013-07-26 MED ORDER — ATENOLOL 12.5 MG HALF TABLET
12.5000 mg | ORAL_TABLET | Freq: Two times a day (BID) | ORAL | Status: DC
  Administered 2013-07-26 – 2013-07-27 (×2): 12.5 mg via ORAL
  Filled 2013-07-26 (×3): qty 1

## 2013-07-26 NOTE — Evaluation (Signed)
Occupational Therapy Evaluation Patient Details Name: Amy Rodriguez MRN: [id number] DOB: Aug 30, 1933 Today's Date: 07/26/2013    History of Present Illness 80 y/o female with past medical hx of HTN, HL, Hypothyroidism , hx of SVT, GERD  presented to ED with acute onset of  numbness in right thumb and index finger last evening while she was at home. The numbness then radiated up her right arm into right shoulder and right side of her face ( lower lips) .this lasted for 30 minute and subsided on its own. She had some headache on the right side and felt like she may pass out.    Clinical Impression   Patient evaluated by Occupational Therapy with no further acute OT needs identified. All education has been completed and the patient has no further questions. See below for any follow-up Occupational Therapy or equipment needs. OT to sign off. Thank you for referral.      Follow Up Recommendations  No OT follow up    Equipment Recommendations  None recommended by OT    Recommendations for Other Services       Precautions / Restrictions Precautions Precautions: Fall Precaution Comments: Patient reports she had 1 fall in December 2014 - tripped on porch. Restrictions Weight Bearing Restrictions: No      Mobility Bed Mobility Overal bed mobility: Independent                Transfers Overall transfer level: Modified independent Equipment used: Straight cane                  Balance Overall balance assessment: Modified Independent                             High Level Balance Comments: assessed during adls and room mobility to obtain necessary adl items. pt able to demonstrate dynamic standing reaching balance to retrieve undewear from bottom 3rd drawer of night stand            ADL Overall ADL's : Modified independent                                       General ADL Comments: Pt assessed for dynamic standing balance, cognition,  sequencing, problem solving, and completed full ADL this session sink level.      Vision                     Perception     Praxis      Pertinent Vitals/Pain none     Hand Dominance Right   Extremity/Trunk Assessment Upper Extremity Assessment Upper Extremity Assessment: Overall WFL for tasks assessed (assessed and sensation at baseline)   Lower Extremity Assessment Lower Extremity Assessment: Defer to PT evaluation   Cervical / Trunk Assessment Cervical / Trunk Assessment: Kyphotic   Communication Communication Communication: No difficulties   Cognition Arousal/Alertness: Awake/alert Behavior During Therapy: WFL for tasks assessed/performed Overall Cognitive Status: Within Functional Limits for tasks assessed                     General Comments       Exercises       Shoulder Instructions      Home Living Family/patient expects to be discharged to:: Private residence Living Arrangements: Children (Son - disabled - per patient he is independent with mobility)  Available Help at Discharge: Family;Available 24 hours/day Type of Home: Mobile home Home Access: Stairs to enter Entrance Stairs-Number of Steps: 4 Entrance Stairs-Rails: Right;Left Home Layout: One level     Bathroom Shower/Tub: Occupational psychologist: Standard     Home Equipment: Environmental consultant - 2 wheels;Cane - single point          Prior Functioning/Environment Level of Independence: Independent with assistive device(s)        Comments: Uses cane and RW depending on pain level in Rt knee    OT Diagnosis:     OT Problem List:     OT Treatment/Interventions:      OT Goals(Current goals can be found in the care plan section)    OT Frequency:     Barriers to D/C:            Co-evaluation              End of Session Nurse Communication: Mobility status  Activity Tolerance: Patient tolerated treatment well Patient left: in chair;with call bell/phone  within reach   Time: 1103-1131 OT Time Calculation (min): 28 min Charges:  OT General Charges $OT Visit: 1 Procedure OT Evaluation $Initial OT Evaluation Tier I: 1 Procedure G-Codes: OT G-codes **NOT FOR INPATIENT CLASS** Functional Assessment Tool Used: clinical judgement Functional Limitation: Self care Self Care Current Status (E7517): 0 percent impaired, limited or restricted Self Care Goal Status (G0174): 0 percent impaired, limited or restricted Self Care Discharge Status (B4496): 0 percent impaired, limited or restricted  Peri Maris 07/26/2013, 4:49 PM Pager: [phone]

## 2013-07-26 NOTE — Evaluation (Signed)
Physical Therapy Evaluation Patient Details Name: Amy Rodriguez MRN: [id number] DOB: Feb 21, 1934 Today's Date: 07/26/2013   History of Present Illness  80 y/o female with past medical hx of HTN, HL, Hypothyroidism , hx of SVT, GERD  presented to ED with acute onset of  numbness in right thumb and index finger last evening while she was at home. The numbness then radiated up her right arm into right shoulder and right side of her face ( lower lips) .this lasted for 30 minute and subsided on its own. She had some headache on the right side and felt like she may pass out.   Clinical Impression  Patient reports symptoms have resolved.  Patient at mod I level with mobility/gait with use of cane - baseline functional level.  No acute PT needs identified - PT will sign off.  Encouraged ambulation in hallway with nursing.    Follow Up Recommendations No PT follow up;Supervision - Intermittent    Equipment Recommendations  None recommended by PT    Recommendations for Other Services       Precautions / Restrictions Precautions Precautions: Fall Precaution Comments: Patient reports she had 1 fall in December 2014 - tripped on porch. Restrictions Weight Bearing Restrictions: No      Mobility  Bed Mobility Overal bed mobility: Independent                Transfers Overall transfer level: Modified independent Equipment used: Straight cane                Ambulation/Gait Ambulation/Gait assistance: Supervision Ambulation Distance (Feet): 180 Feet Assistive device: Straight cane Gait Pattern/deviations: Step-through pattern;Decreased stride length;Trunk flexed Gait velocity: Decreased Gait velocity interpretation: Below normal speed for age/gender General Gait Details: Patient demonstrates safe use of cane.  Balance fairly good with gait.  No staggering or loss of balance noted during gait.  Stairs            Wheelchair Mobility    Modified Rankin (Stroke Patients  Only) Modified Rankin (Stroke Patients Only) Pre-Morbid Rankin Score: No significant disability Modified Rankin: No significant disability     Balance Overall balance assessment: Modified Independent                           High level balance activites: Turns;Sudden stops;Head turns (Picked up pen from floor) High Level Balance Comments: No loss of balance with these high level activities.             Pertinent Vitals/Pain     Home Living Family/patient expects to be discharged to:: Private residence Living Arrangements: Children (Son - disabled - per patient he is independent with mobility) Available Help at Discharge: Family;Available 24 hours/day Type of Home: Mobile home Home Access: Stairs to enter Entrance Stairs-Rails: Right;Left Entrance Stairs-Number of Steps: 4 Home Layout: One level Home Equipment: Walker - 2 wheels;Cane - single point      Prior Function Level of Independence: Independent with assistive device(s)         Comments: Uses cane and RW depending on pain level in Rt knee     Hand Dominance        Extremity/Trunk Assessment   Upper Extremity Assessment: Generalized weakness (Arthritic changes noted in hands)           Lower Extremity Assessment: Generalized weakness (Strength symmetrical)      Cervical / Trunk Assessment: Kyphotic  Communication   Communication: No difficulties  Cognition Arousal/Alertness:  Awake/alert Behavior During Therapy: WFL for tasks assessed/performed Overall Cognitive Status: Within Functional Limits for tasks assessed                      General Comments      Exercises        Assessment/Plan    PT Assessment Patent does not need any further PT services  PT Diagnosis     PT Problem List    PT Treatment Interventions     PT Goals (Current goals can be found in the Care Plan section)      Frequency     Barriers to discharge        Co-evaluation                End of Session Equipment Utilized During Treatment: Gait belt Activity Tolerance: Patient tolerated treatment well Patient left: in bed;with call bell/phone within reach;with bed alarm set Nurse Communication: Mobility status (No PT needs)    Functional Assessment Tool Used: Clinical judgement Functional Limitation: Mobility: Walking and moving around Mobility: Walking and Moving Around Current Status (N3614): 0 percent impaired, limited or restricted Mobility: Walking and Moving Around Goal Status [phone]): 0 percent impaired, limited or restricted Mobility: Walking and Moving Around Discharge Status [phone]): 0 percent impaired, limited or restricted    Time: 0827-0841 PT Time Calculation (min): 14 min   Charges:   PT Evaluation $Initial PT Evaluation Tier I: 1 Procedure PT Treatments $Gait Training: 8-22 mins   PT G Codes:   Functional Assessment Tool Used: Clinical judgement Functional Limitation: Mobility: Walking and moving around    [employer] 07/26/2013, 9:03 AM Carita Pian. Sanjuana Kava, [place] Pager [phone]

## 2013-07-26 NOTE — Progress Notes (Signed)
  Echocardiogram 2D Echocardiogram has been performed.  Carney Corners 07/26/2013, 4:30 PM

## 2013-07-26 NOTE — Progress Notes (Signed)
[facility]                                                                              Patient Demographics  Amy Rodriguez, is a 80 y.o. female, DOB - [birth year], KDT:[id number]  Admit date - 07/25/2013   Admitting Physician No admitting provider for patient encounter.  Outpatient Primary MD for the patient is Amy Rodriguez  LOS - 1   Chief Complaint  Patient presents with  . Transient Ischemic Attack        Assessment & Plan   Right hand numbness/questionable TIA versus CVA -CT of the head: Negative for acute  -Pending MRI/MRA of the brain, carotid Doppler, echocardiogram -Hemoglobin A1c pending -Lipid panel 143/147/45/69 -PT and OT have been consulted -Continue aspirin and statin for her stroke prevention -Neurology consulted, pending evaluation and recommendations  Hyperlipidemia -Continue statin -LDL 69  Hypertension -Continue atenolol  Hypothyroidism -TSH 8.120 -Continue Synthroid, however will increase to 55mcg daily -Will need outpatient followup and monitoring  History of SVT -Followed by cardiology had a normal 48 hour Holter monitor in December 2014  Vitamin B12 deficiency -Continue supplementation  Arthritis -Continue ibuprofen as needed  Code Status: Full  Family Communication: None at bedside  Disposition Plan: Admitted for observation, currently pending workup  Time Spent in minutes   30 minutes  Procedures none  Consults  neurology  DVT Prophylaxis  Lovenox   Lab Results  Component Value Date   PLT 201 07/25/2013    Medications  Scheduled Meds: . aspirin  325 mg Oral Daily  . atenolol  12.5 mg Oral BID  . cholecalciferol  1,000 Units Oral Daily  . enoxaparin (LOVENOX) injection  40 mg Subcutaneous Q24H  . fluticasone  2 spray Each Nare Daily  . levothyroxine  50 mcg Oral QAC breakfast  . loratadine  10 mg Oral Daily  . pantoprazole  40 mg Oral Daily  . simvastatin  10 mg Oral QPM  . vitamin B-12  100 mcg  Oral Daily   Continuous Infusions:  PRN Meds:.ibuprofen  Antibiotics   Anti-infectives   None      Subjective:   Amy Rodriguez seen and examined today.  Patient no longer has complaints of numbness in her right arm and face.  She complains of pain in her right leg.    Objective:   Filed Vitals:   07/25/13 2200 07/26/13 0000 07/26/13 0200 07/26/13 0400  BP: 178/68 111/61 129/60 142/67  Pulse: 53 65 56 52  Temp: 97.7 F (36.5 C) 97.9 F (36.6 C) 98 F (36.7 C) 97.8 F (36.6 C)  TempSrc: Oral Oral Oral Oral  Resp: 20 20 20 20   Height: 5\' 2"  (1.575 m)     Weight: 66.1 kg (145 lb 11.6 oz)     SpO2: 97% 95% 98% 97%    Wt Readings from Last 3 Encounters:  07/25/13 66.1 kg (145 lb 11.6 oz)  06/17/13 68.947 kg (152 lb)  03/19/13 66.225 kg (146 lb)    No intake or output data in the 24 hours ending 07/26/13 0817  Exam  General: Well developed, well nourished, NAD, appears stated age  HEENT: NCAT, PERRLA, EOMI, Anicteic  Sclera, mucous membranes moist.   Neck: Supple, no JVD, no masses  Cardiovascular: S1 S2 auscultated, no rubs, murmurs or gallops. Regular rate and rhythm.  Respiratory: Clear to auscultation bilaterally with equal chest rise  Abdomen: Soft, nontender, nondistended, + bowel sounds  Extremities: warm dry without cyanosis clubbing or edema  Neuro: AAOx3, cranial nerves grossly intact. Strength 5/5 in patient's upper and lower extremities bilaterally  Skin: Without rashes exudates or nodules  Psych: Normal affect and demeanor with intact judgement and insight  Data Review   Micro Results No results found for this or any previous visit (from the past 240 hour(s)).  Radiology Reports Ct Head Wo Contrast  07/25/2013   CLINICAL DATA:  TRANSIENT ISCHEMIC ATTACK  EXAM: CT HEAD WITHOUT CONTRAST  TECHNIQUE: Contiguous axial images were obtained from the base of the skull through the vertex without intravenous contrast.  COMPARISON:  None available for  comparison at time of study interpretation.  FINDINGS: The ventricles and sulci are normal for age. No intraparenchymal hemorrhage, mass effect nor midline shift. Patchy supratentorial white matter hypodensities are less than expected for patient's age and though non-specific suggest sequelae of chronic small vessel ischemic disease. No acute large vascular territory infarcts.  No abnormal extra-axial fluid collections. Basal cisterns are patent. Mild calcific atherosclerosis of the carotid siphons.  No skull fracture. Visualized paranasal sinuses and mastoid aircells are well-aerated. The included ocular globes and orbital contents are non-suspicious. Status post right ocular lens implant. 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  IMPRESSION: No acute intracranial process ; normal noncontrast CT of the head for age.If clinical concern for acute ischemia, MRI of the brain with diffusion-weighted sequences would be more sensitive.   Electronically Signed   By: Elon Alas   On: 07/25/2013 15:59    CBC  Recent Labs Lab 07/25/13 1401  WBC 8.0  HGB 13.0  HCT 37.2  PLT 201  MCV 83.2  MCH 29.1  MCHC 34.9  RDW 12.4    Chemistries   Recent Labs Lab 07/25/13 1401  NA 135*  K 4.1  CL 96  CO2 20  GLUCOSE 136*  BUN 9  CREATININE 0.67  CALCIUM 9.0  AST 14  ALT 9  ALKPHOS 66  BILITOT 0.3   ------------------------------------------------------------------------------------------------------------------ estimated creatinine clearance is 52.5 ml/min (by C-G formula based on Cr of 0.67). ------------------------------------------------------------------------------------------------------------------ No results found for this basename: HGBA1C,  in the last 72 hours ------------------------------------------------------------------------------------------------------------------ No results found for this basename: CHOL, HDL, LDLCALC, TRIG, CHOLHDL, LDLDIRECT,  in the  last 72 hours ------------------------------------------------------------------------------------------------------------------ No results found for this basename: TSH, T4TOTAL, FREET3, T3FREE, THYROIDAB,  in the last 72 hours ------------------------------------------------------------------------------------------------------------------ No results found for this basename: VITAMINB12, FOLATE, FERRITIN, TIBC, IRON, RETICCTPCT,  in the last 72 hours  Coagulation profile  Recent Labs Lab 07/25/13 1401  INR 1.05    No results found for this basename: DDIMER,  in the last 72 hours  Cardiac Enzymes No results found for this basename: CK, CKMB, TROPONINI, MYOGLOBIN,  in the last 168 hours ------------------------------------------------------------------------------------------------------------------ No components found with this basename: POCBNP,     Annaleise Burger D.O. on 07/26/2013 at 8:17 AM  Between 7am to 7pm - Pager - [phone]  After 7pm go to [email or web address] - password [email or web address]  And look for the night coverage person covering for me after hours  [facility] Office  [phone]

## 2013-07-26 NOTE — Progress Notes (Signed)
Utilization Review Completed.  

## 2013-07-26 NOTE — Progress Notes (Signed)
VASCULAR LAB PRELIMINARY  PRELIMINARY  PRELIMINARY  PRELIMINARY  Carotid Dopplers completed.    Preliminary report:  1-39% ICA stenosis.  Vertebral artery flow is antegrade.  Iantha Fallen, RVT 07/26/2013, 11:00 AM

## 2013-07-27 DIAGNOSIS — G459 Transient cerebral ischemic attack, unspecified: Secondary | ICD-10-CM | POA: Diagnosis not present

## 2013-07-27 MED ORDER — LEVOTHYROXINE SODIUM 75 MCG PO TABS: 75.0000 ug | ORAL_TABLET | Freq: Every day | ORAL | Status: DC

## 2013-07-27 MED ORDER — ASPIRIN 325 MG PO TABS: 325.0000 mg | ORAL_TABLET | Freq: Every day | ORAL | Status: DC

## 2013-07-27 NOTE — Discharge Summary (Signed)
Physician Discharge Summary  Amy Rodriguez VZD:[id number] DOB: [birth year] DOA: 07/25/2013  PCP: Irene Pap, NP  Admit date: 07/25/2013 Discharge date: 07/27/2013  Time spent:  45 minutes  Recommendations for Outpatient Follow-up:  Patient will be discharged to home. She should follow up with her primary care physician within one week of discharge. Patient should also follow up with Dr. Leonie Man within 2 months of discharge. Her Synthroid was increased and should be monitored by her primary care physician. She should continue taking her medications as prescribed.   Discharge Diagnoses:  Principal Problem:   TIA (transient ischemic attack) Active Problems:   HYPERLIPIDEMIA   HYPERTENSION, BENIGN ESSENTIAL   TACHYCARDIA, PAROXYSMAL SUPRAVENTRICULAR   OSTEOARTHRITIS, MULTI SITES   Hypothyroidism  Discharge Condition: Stable  Diet recommendation: Heart healthy   Filed Weights   07/25/13 2200  Weight: 66.1 kg (145 lb 11.6 oz)    History of present illness:  80 y/o female with past medical hx of HTN, HL, Hypothyroidism , hx of SVT, GERD presented to ED with acute onset of numbness in right thumb and index finger before coming into the hospital. The numbness radiated up her right arm into right shoulder and right side of her face ( lower lips), which lasted 30 minutes and subsided on its own. She had some headache on the right side and felt like she may pass out. She does have occasional migraine headaches but these symptoms were nowhere similar to it. Denied any weakness or slurred speech. She does report off and on tinnitus for past few months. Denied recent travel or change in medications. Patient denied dizziness, fever, chills, nausea, vomiting, chest pain, palpitations, SOB, abdominal pain, bowel or urinary symptoms. Deniedchange in weight or appetite.denies similar symptoms in past.  She called EMS and by the time they arrived symptoms had resolved. She called her PCP who suggested  her to come to the ED   Hospital Course:  Right hand numbness/questionable TIA  -CT of the head: Negative for acute  -MRI of the brain: No acute infarct -MRI of the head: Question 1.3 mm aneurysm superior aspect of proximal M1 segment of the right middle cerebral artery. -Carotid doppler: 1-39% ICA stenosis. Vertebral artery flow is antegrade. -Echocardiogram: EF 29-51%, grade 1 diastolic dysfunction -Hemoglobin A1c 5.8 -Lipid panel 143/147/45/69  -PT and OT have been consulted and recommended no follow up -Continue aspirin and statin for her stroke prevention  -Patient will need to see Dr. Leonie Man in 2 months for followup  Hyperlipidemia  -Continue statin  -LDL 69   Hypertension  -Continue atenolol   Hypothyroidism  -TSH 8.120  -Continue Synthroid, however will increase to 31mcg daily  -Will need outpatient followup and monitoring   History of SVT  -Followed by cardiology had a normal 48 hour Holter monitor in December 2014   Vitamin B12 deficiency  -Continue supplementation   Arthritis  -Continue ibuprofen as needed   Procedures: Echocardiogram Study Conclusions Left ventricle: The cavity size was normal. Wall thickness was normal. Systolic function was normal. The estimated ejection fraction was in the range of 55% to 60%. Wall motion was normal; there were no regional wall motion abnormalities. Doppler parameters are consistent with abnormal left ventricular relaxation (grade 1 diastolic dysfunction).   Carotid Dopplers Preliminary report: 1-39% ICA stenosis. Vertebral artery flow is antegrade.  Consultations: None  Discharge Exam: Filed Vitals:   07/27/13 0839  BP: 115/64  Pulse: 63  Temp: 97.5 F (36.4 C)  Resp: 18  Exam  General: Well developed, well nourished, NAD, appears stated age  HEENT: NCAT, PERRLA, EOMI, Anicteic Sclera, mucous membranes moist.  Neck: Supple, no JVD, no masses  Cardiovascular: S1 S2 auscultated, no rubs, murmurs or gallops.  Regular rate and rhythm.  Respiratory: Clear to auscultation bilaterally with equal chest rise  Abdomen: Soft, nontender, nondistended, + bowel sounds  Extremities: warm dry without cyanosis clubbing or edema  Neuro: AAOx3, cranial nerves grossly intact. Strength 5/5 in patient's upper and lower extremities bilaterally  Skin: Without rashes exudates or nodules  Psych: Normal affect and demeanor with intact judgement and insight  Discharge Instructions      Discharge Orders   Future Appointments Provider Department Dept Phone   08/04/2013 11:15 AM Lady Saucier, [place] [phone]   08/21/2013 4:15 PM Lelon Perla, MD [place] Office [phone]   12/19/2013 3:00 PM Irene Pap, NP [place] [phone]   Future Orders Complete By Expires   Diet - low sodium heart healthy  As directed    Discharge instructions  As directed    Driving Restrictions  As directed    Increase activity slowly  As directed        Medication List         aspirin 325 MG tablet  Take 1 tablet (325 mg total) by mouth daily.     atenolol 25 MG tablet  Commonly known as:  TENORMIN  Take 12.5 mg by mouth 2 (two) times daily.     diclofenac sodium 1 % Gel  Commonly known as:  VOLTAREN  Apply 2 g topically 2 (two) times daily.     fexofenadine 180 MG tablet  Commonly known as:  ALLEGRA  Take 1 tablet (180 mg total) by mouth daily as needed. Allergies     fluticasone 50 MCG/ACT nasal spray  Commonly known as:  FLONASE  Place 2 sprays into the nose as needed. Allergies     ibuprofen 200 MG tablet  Commonly known as:  ADVIL,MOTRIN  Take 200 mg by mouth at bedtime as needed.     levothyroxine 75 MCG tablet  Commonly known as:  SYNTHROID, LEVOTHROID  Take 1 tablet (75 mcg total) by mouth daily before breakfast.     omeprazole 10 MG capsule  Commonly known as:  PRILOSEC  Take 1 capsule (10 mg total) by  mouth daily.     simvastatin 10 MG tablet  Commonly known as:  ZOCOR  Take 1 tablet (10 mg total) by mouth every evening.     VITAMIN B12 PO  Take 2 each by mouth daily. 5000units per dropperful     Vitamin D3 5000 UNITS Caps  Take 1 capsule by mouth daily. Take with food containing fats.       Allergies  Allergen Reactions  . Benzonatate Nausea And Vomiting    "tessalon pearl"  . Doxycycline Nausea And Vomiting  . Sulfonamide Derivatives Itching and Rash  . Amlodipine Other (See Comments)    Heart palpitations    Follow-up Information   Follow up with [name], [name], NP. Schedule an appointment as soon as possible for a visit in 1 week. [facility] follow up)    Specialty:  Nurse Practitioner   Contact information:   [address] [place] [address] [phone]       Follow up with Forbes Cellar, MD. Schedule an appointment as soon as possible  for a visit in 2 months. [facility] followup)    Specialties:  Neurology, Radiology   Contact information:   [address] [place] [address] [phone]        The results of significant diagnostics from this hospitalization (including imaging, microbiology, ancillary and laboratory) are listed below for reference.    Significant Diagnostic Studies: Ct Head Wo Contrast  07/25/2013   CLINICAL DATA:  TRANSIENT ISCHEMIC ATTACK  EXAM: CT HEAD WITHOUT CONTRAST  TECHNIQUE: Contiguous axial images were obtained from the base of the skull through the vertex without intravenous contrast.  COMPARISON:  None available for comparison at time of study interpretation.  FINDINGS: The ventricles and sulci are normal for age. No intraparenchymal hemorrhage, mass effect nor midline shift. Patchy supratentorial white matter hypodensities are less than expected for patient's age and though non-specific suggest sequelae of chronic small vessel ischemic disease. No acute large vascular territory infarcts.  No abnormal  extra-axial fluid collections. Basal cisterns are patent. Mild calcific atherosclerosis of the carotid siphons.  No skull fracture. Visualized paranasal sinuses and mastoid aircells are well-aerated. The included ocular globes and orbital contents are non-suspicious. Status post right ocular lens implant. 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  IMPRESSION: No acute intracranial process ; normal noncontrast CT of the head for age.If clinical concern for acute ischemia, MRI of the brain with diffusion-weighted sequences would be more sensitive.   Electronically Signed   By: Awilda Metro   On: 07/25/2013 15:59   Mr Maxine Glenn Head Wo Contrast  07/26/2013   CLINICAL DATA:  Right upper extremity and right face numbness which has resolved. Hypertension and hyperlipidemia.  EXAM: MRI HEAD WITHOUT CONTRAST  MRA HEAD WITHOUT CONTRAST  TECHNIQUE: Multiplanar, multiecho pulse sequences of the brain and surrounding structures were obtained without intravenous contrast. Angiographic images of the head were obtained using MRA technique without contrast.  COMPARISON:  07/25/2013 CT.  No comparison MR.  FINDINGS: MRI HEAD FINDINGS  Patient would only tolerate MR angiogram, diffusion imaging, T2 axial imaging and sagittal T1 weighted imaging.  No acute infarct.  No obvious intracranial hemorrhage.  No discrete mass identified.  Mild atrophy without hydrocephalus.  Cervical spondylotic changes C3-4 level mild spinal stenosis and minimal cord flattening. Mild transverse ligament hypertrophy.  MRA HEAD FINDINGS  Anterior circulation without medium or large size vessel significant stenosis or occlusion.  Fetal type contribution to the posterior cerebral artery bilaterally.  Right vertebral artery is dominant.  No significant stenosis of the basilar artery.  Non visualization right posterior inferior cerebellar artery.  Mild branch vessel irregularity.  Question 1.3 mm aneurysm superior aspect of the proximal M1  segment of the right middle cerebral artery.  IMPRESSION: MRI HEAD:  Patient would only tolerate MR angiogram, diffusion imaging, T2 axial imaging and sagittal T1 weighted imaging.  No acute infarct.  Cervical spondylotic changes C3-4 level with mild spinal stenosis and minimal cord flattening.  MRA HEAD:  Anterior circulation without medium or large size vessel significant stenosis or occlusion.  Fetal type contribution to the posterior cerebral artery bilaterally.  Right vertebral artery is dominant.  No significant stenosis of the basilar artery.  Non visualization right posterior inferior cerebellar artery.  Mild branch vessel irregularity.  Question 1.3 mm aneurysm superior aspect of the proximal M1 segment of the right middle cerebral artery.   Electronically Signed   By: Bridgett Larsson M.D.   On: 07/26/2013 21:53   Mr Brain Wo Contrast  07/26/2013  CLINICAL DATA:  Right upper extremity and right face numbness which has resolved. Hypertension and hyperlipidemia.  EXAM: MRI HEAD WITHOUT CONTRAST  MRA HEAD WITHOUT CONTRAST  TECHNIQUE: Multiplanar, multiecho pulse sequences of the brain and surrounding structures were obtained without intravenous contrast. Angiographic images of the head were obtained using MRA technique without contrast.  COMPARISON:  07/25/2013 CT.  No comparison MR.  FINDINGS: MRI HEAD FINDINGS  Patient would only tolerate MR angiogram, diffusion imaging, T2 axial imaging and sagittal T1 weighted imaging.  No acute infarct.  No obvious intracranial hemorrhage.  No discrete mass identified.  Mild atrophy without hydrocephalus.  Cervical spondylotic changes C3-4 level mild spinal stenosis and minimal cord flattening. Mild transverse ligament hypertrophy.  MRA HEAD FINDINGS  Anterior circulation without medium or large size vessel significant stenosis or occlusion.  Fetal type contribution to the posterior cerebral artery bilaterally.  Right vertebral artery is dominant.  No significant  stenosis of the basilar artery.  Non visualization right posterior inferior cerebellar artery.  Mild branch vessel irregularity.  Question 1.3 mm aneurysm superior aspect of the proximal M1 segment of the right middle cerebral artery.  IMPRESSION: MRI HEAD:  Patient would only tolerate MR angiogram, diffusion imaging, T2 axial imaging and sagittal T1 weighted imaging.  No acute infarct.  Cervical spondylotic changes C3-4 level with mild spinal stenosis and minimal cord flattening.  MRA HEAD:  Anterior circulation without medium or large size vessel significant stenosis or occlusion.  Fetal type contribution to the posterior cerebral artery bilaterally.  Right vertebral artery is dominant.  No significant stenosis of the basilar artery.  Non visualization right posterior inferior cerebellar artery.  Mild branch vessel irregularity.  Question 1.3 mm aneurysm superior aspect of the proximal M1 segment of the right middle cerebral artery.   Electronically Signed   By: Chauncey Cruel M.D.   On: 07/26/2013 21:53    Microbiology: No results found for this or any previous visit (from the past 240 hour(s)).   Labs: Basic Metabolic Panel:  Recent Labs Lab 07/25/13 1401  NA 135*  K 4.1  CL 96  CO2 20  GLUCOSE 136*  BUN 9  CREATININE 0.67  CALCIUM 9.0   Liver Function Tests:  Recent Labs Lab 07/25/13 1401  AST 14  ALT 9  ALKPHOS 66  BILITOT 0.3  PROT 7.2  ALBUMIN 3.9   No results found for this basename: LIPASE, AMYLASE,  in the last 168 hours No results found for this basename: AMMONIA,  in the last 168 hours CBC:  Recent Labs Lab 07/25/13 1401  WBC 8.0  HGB 13.0  HCT 37.2  MCV 83.2  PLT 201   Cardiac Enzymes: No results found for this basename: CKTOTAL, CKMB, CKMBINDEX, TROPONINI,  in the last 168 hours BNP: BNP (last 3 results) No results found for this basename: PROBNP,  in the last 8760 hours CBG: No results found for this basename: GLUCAP,  in the last 168  hours     Signed:  Cristal Ford  [facility] 07/27/2013, 10:46 AM

## 2013-07-27 NOTE — Progress Notes (Signed)
Ready for discharge home today; discharge instructions given and reviewed with patient.

## 2013-08-01 ENCOUNTER — Encounter: Payer: Self-pay | Admitting: Nurse Practitioner

## 2013-08-01 ENCOUNTER — Ambulatory Visit (INDEPENDENT_AMBULATORY_CARE_PROVIDER_SITE_OTHER): Payer: Commercial Managed Care - HMO | Admitting: Nurse Practitioner

## 2013-08-01 VITALS — BP 137/78 | HR 61 | Temp 98.0°F | Resp 18 | Ht 62.0 in | Wt 147.0 lb

## 2013-08-01 DIAGNOSIS — E039 Hypothyroidism, unspecified: Secondary | ICD-10-CM

## 2013-08-01 DIAGNOSIS — I10 Essential (primary) hypertension: Secondary | ICD-10-CM

## 2013-08-01 DIAGNOSIS — E785 Hyperlipidemia, unspecified: Secondary | ICD-10-CM

## 2013-08-01 DIAGNOSIS — J989 Respiratory disorder, unspecified: Secondary | ICD-10-CM

## 2013-08-01 DIAGNOSIS — E538 Deficiency of other specified B group vitamins: Secondary | ICD-10-CM

## 2013-08-01 DIAGNOSIS — G459 Transient cerebral ischemic attack, unspecified: Secondary | ICD-10-CM

## 2013-08-01 MED ORDER — GUAIFENESIN 400 MG PO TABS: ORAL_TABLET | ORAL | Status: DC

## 2013-08-01 MED ORDER — DOXYCYCLINE HYCLATE 100 MG PO TABS: 100.0000 mg | ORAL_TABLET | Freq: Two times a day (BID) | ORAL | Status: DC

## 2013-08-01 NOTE — Assessment & Plan Note (Signed)
Recent hospitalization. Cough, fever, fatigue. DD: hospital-acquired pneumonia, bronchitis, flu Doxy, humibid See pt instructions. F/u 2 wks

## 2013-08-01 NOTE — Assessment & Plan Note (Signed)
Good control on zocor 10 mg. Continue med.

## 2013-08-01 NOTE — Patient Instructions (Signed)
Start antibiotic and guaifenesen. Sip fluids every hour. Rest for several days. Follow up in 2 weeks. I will check thyroid function in 6 weeks & b12.  Continue aspirin, synthroid, atenolol & cholesterol medicine.  See in a few weeks or sooner if you feel worse.

## 2013-08-01 NOTE — Assessment & Plan Note (Addendum)
Carotid studies: 39% ICA stenosis. CT brain: No acute intracranial process ; normal noncontrast CT of the head for age. MRA brain : Question 1.3 mm aneurysm superior aspect of the proximal M1 segment of the right middle cerebral artery. MRI brain: No acute infarct. No obvious intracranial hemorrhage. No discrete mass identified. Mild atrophy without hydrocephalus. Cervical spondylotic changes C3-4 level mild spinal stenosis and  minimal cord flattening. Mild transverse ligament hypertrophy.  2 D Echo EF 55%.  Neuro f/u in July Cardio f/u in 2 weeks ASA started.

## 2013-08-01 NOTE — Assessment & Plan Note (Signed)
Bp Goal <150/90. Good control on atenolol 25mg . HR has been low in past. Etio unclear. Hypothyroidism? Have not used ACE or ARB due to chronic hyponatremia. Recent TIA.

## 2013-08-01 NOTE — Progress Notes (Signed)
Pre visit review using our clinic review tool, if applicable. No additional management support is needed unless otherwise documented below in the visit note. 

## 2013-08-01 NOTE — Assessment & Plan Note (Addendum)
Elevated TSH Levothyroxine increased to 75 mcg from 50 mcg last week in hospital. Continue at 75 mcg. Check TSH in 6 weeks. Bradycardia, fatigue, & dizziness may have been r/t hypothyroidism.

## 2013-08-04 ENCOUNTER — Ambulatory Visit: Payer: Commercial Managed Care - HMO | Admitting: Dietician

## 2013-08-05 DIAGNOSIS — E538 Deficiency of other specified B group vitamins: Secondary | ICD-10-CM | POA: Insufficient documentation

## 2013-08-05 NOTE — Assessment & Plan Note (Addendum)
188 3/'14. No anemia. Dizzy, fatigue. Started oral supplement. Will check in 6 weeks.

## 2013-08-05 NOTE — Progress Notes (Signed)
Subjective:     Amy Rodriguez is a 80 y.o. female who presents for follow up of TIA. Event occurred a few days ago. She has residual symptoms of paresthesia R thumb & index finger & bottom lip.. She denies balance disturbance, cognitive impairment, gait disturbance, inability to speak, slurred speech and swallowing difficulty. Overall she feels her condition is rapidly improving. Stroke risk factors include: hyperlipidemia and hypertension.  She denies blurred vision, confusion, difficulty swallowing, difficulty understanding, difficulty walking, dizziness, drowsiness, dysarthria, dysphagia, slurred speech and vertigo.   The following portions of the patient's history were reviewed and updated as appropriate: allergies, current medications, past medical history, past social history, past surgical history and problem list.  Review of Systems Pertinent items are noted in HPI.    Objective:    BP 137/78  Pulse 61  Temp(Src) 98 F (36.7 C) (Oral)  Resp 18  Ht 5\' 2"  (1.575 m)  Wt 147 lb (66.679 kg)  BMI 26.88 kg/m2  SpO2 95% BP 137/78  Pulse 61  Temp(Src) 98 F (36.7 C) (Oral)  Resp 18  Ht 5\' 2"  (1.575 m)  Wt 147 lb (66.679 kg)  BMI 26.88 kg/m2  SpO2 95% General appearance: alert, cooperative, appears stated age and no distress Head: Normocephalic, without obvious abnormality, atraumatic Eyes: negative findings: lids and lashes normal and conjunctivae and sclerae normal Ears: normal TM's and external ear canals both ears Throat: lips, mucosa, and tongue normal; teeth and gums normal Lungs: clear to auscultation bilaterally and productive cough, green sputum Heart: regular rate and rhythm, S1, S2 normal, no murmur, click, rub or gallop Extremities: extremities normal, atraumatic, no cyanosis or edema Pulses: 2+ and symmetric Lymph nodes: no cervical LAD    Assessment:    Status post, TIA  Risk Factor Management:  Hypertension target range 130-140/70-80 Lipid range - LDL < 100  and checked every 6 months, fasting   Respiratory illness post hospital stay DD: hospital acquired pneumonia   Unspecified hypothyroidism - TSH; Future   Vitamin B 12 deficiency - Vitamin B12; Future   HYPERLIPIDEMIA Good control on zocor 10 mg   HYPERTENSION, BENIGN ESSENTIAL Good control 25 mg atenolol    Plan:    Medications: aspirin 325 mg orally every day. f/u w/cardio in 2 weeks F/u w/ neuro in July-called to have pt put on waiting list due to residual symptoms. Follow up with me in 2 weeks.   - doxycycline (VIBRA-TABS) 100 MG tablet; Take 1 tablet (100 mg total) by mouth 2 (two) times daily.  Dispense: 20 tablet; Refill: 0 - guaifenesin (HUMIBID E) 400 MG TABS tablet; Take 1t po q8h for 5 days then q8h PRN chest congestion  Dispense: 56 tablet; Refill: 0

## 2013-08-15 ENCOUNTER — Ambulatory Visit (INDEPENDENT_AMBULATORY_CARE_PROVIDER_SITE_OTHER): Payer: Commercial Managed Care - HMO | Admitting: Nurse Practitioner

## 2013-08-15 ENCOUNTER — Encounter: Payer: Self-pay | Admitting: Nurse Practitioner

## 2013-08-15 ENCOUNTER — Ambulatory Visit: Payer: Commercial Managed Care - HMO | Admitting: Nurse Practitioner

## 2013-08-15 VITALS — BP 112/70 | HR 57 | Temp 97.6°F | Ht 62.0 in | Wt 146.0 lb

## 2013-08-15 DIAGNOSIS — M25561 Pain in right knee: Secondary | ICD-10-CM | POA: Insufficient documentation

## 2013-08-15 DIAGNOSIS — M25569 Pain in unspecified knee: Secondary | ICD-10-CM

## 2013-08-15 DIAGNOSIS — J989 Respiratory disorder, unspecified: Secondary | ICD-10-CM

## 2013-08-15 NOTE — Progress Notes (Signed)
Pre visit review using our clinic review tool, if applicable. No additional management support is needed unless otherwise documented below in the visit note. 

## 2013-08-15 NOTE — Progress Notes (Signed)
Subjective:     Amy Rodriguez is a 80 y.o. female returns for follow up of cough & congestion post hospitalization. She reports cough resolved, still feeling tired. C/o R knee pain. Interfering with ADL.Saw ortho in past-min arthritis. Had steroid inj w/temporary relief. Uses voltaren gel with minimal relief. Fell on knee since she saw ortho. Has more pain after fall.   The following portions of the patient's history were reviewed and updated as appropriate: allergies, current medications, past medical history, past social history, past surgical history and problem list.  Review of Systems Pertinent items are noted in HPI.    Objective:    BP 112/70  Pulse 57  Temp(Src) 97.6 F (36.4 C) (Oral)  Ht 5\' 2"  (1.575 m)  Wt 146 lb (66.225 kg)  BMI 26.70 kg/m2  SpO2 96% BP 112/70  Pulse 57  Temp(Src) 97.6 F (36.4 C) (Oral)  Ht 5\' 2"  (1.575 m)  Wt 146 lb (66.225 kg)  BMI 26.70 kg/m2  SpO2 96% General appearance: alert, cooperative, appears stated age and no distress Head: Normocephalic, without obvious abnormality, atraumatic Eyes: negative findings: lids and lashes normal and conjunctivae and sclerae normal Lungs: clear to auscultation bilaterally Heart: regular rate and rhythm, S1, S2 normal, no murmur, click, rub or gallop Extremities: R knee warm, mild swelling over medial bursa, tender, painfiul flexion Pulses: 2+ and symmetric   Procedure note: R knee intraarticular injection. Verbal consent obtained for intraarticular injection. Potential complications explained including pain, infection. Knee joint accessed from medial aspect in straight leg position using sterile technique. Area cleansed with 3 betadine swabs. Ethyl chloride spray used for anesthesia until skin blanched. R knee Joint space accessed using 25 ga 1 1/2 " needle. No blood or fluid return on aspiration. 2 ml 1% lidocaine and 40 mg depo-medrol injected into joint space. Withdrew needle. Pt tolerated procedure with  minimal pain. Pressure held at site for 1 minute. No bleeding or hematoma noted. Cleaned betadine off skin. Covered site with band-aid.    Assessment & plan:     1. Knee pain, right   2. Respiratory illness   See problem list for complete A&P

## 2013-08-15 NOTE — Patient Instructions (Addendum)
Ice knee for 10  minutes few times daily today & tomorrow. Then use heat several times daily. Continue to use voltaren gel 3 times daily. F/u in 3 weeks.  Fatigue may be related to recent respiratory illness or to thyroid. I will check thyroid finction in 3 weeks.  Nice to see you!

## 2013-08-15 NOTE — Assessment & Plan Note (Signed)
Cough & congestion Improved w/doxy. Still feeling fatigued.

## 2013-08-15 NOTE — Assessment & Plan Note (Addendum)
Warm, swollen over medial bursa Discussed intraarticular inj. Had in past w/moderate relief. Inj in ofc today. ACE wrap. F/u 2 weeks.

## 2013-08-21 ENCOUNTER — Encounter: Payer: Self-pay | Admitting: Cardiology

## 2013-08-21 ENCOUNTER — Ambulatory Visit (INDEPENDENT_AMBULATORY_CARE_PROVIDER_SITE_OTHER): Payer: Commercial Managed Care - HMO | Admitting: Cardiology

## 2013-08-21 VITALS — BP 148/68 | HR 60 | Ht 62.0 in | Wt 147.0 lb

## 2013-08-21 DIAGNOSIS — I679 Cerebrovascular disease, unspecified: Secondary | ICD-10-CM

## 2013-08-21 DIAGNOSIS — E785 Hyperlipidemia, unspecified: Secondary | ICD-10-CM

## 2013-08-21 DIAGNOSIS — I471 Supraventricular tachycardia, unspecified: Secondary | ICD-10-CM

## 2013-08-21 DIAGNOSIS — I10 Essential (primary) hypertension: Secondary | ICD-10-CM

## 2013-08-21 DIAGNOSIS — G459 Transient cerebral ischemic attack, unspecified: Secondary | ICD-10-CM

## 2013-08-21 NOTE — Assessment & Plan Note (Signed)
No recent episodes. Continue beta blocker. 

## 2013-08-21 NOTE — Patient Instructions (Signed)
Your physician wants you to follow-up in: ONE YEAR WITH DR CRENSHAW You will receive a reminder letter in the mail two months in advance. If you don't receive a letter, please call our office to schedule the follow-up appointment.  

## 2013-08-21 NOTE — Assessment & Plan Note (Signed)
Continue aspirin.Followup neurology.

## 2013-08-21 NOTE — Assessment & Plan Note (Signed)
Continue statin. 

## 2013-08-21 NOTE — Assessment & Plan Note (Signed)
Continue present blood pressure medications. 

## 2013-08-21 NOTE — Progress Notes (Signed)
HPI: FU SVT. Nuclear study in August of 2013 showed an ejection fraction of 76% and normal perfusion. Holter 12/14 showed sinus with pacs, pvcs and rare couplet. Echo 4/15 showed normal LV function. Carotid dopplers 4/15 showed 1-39 bilateral stenosis. Admitted with possible TIA 4/15. Since DC, She recovered from hospital-acquired pneumonia. She denies dyspnea, chest pain or syncope. She has not had any bouts of SVT.   Current Outpatient Prescriptions  Medication Sig Dispense Refill  . aspirin 325 MG tablet Take 1 tablet (325 mg total) by mouth daily.      Marland Kitchen atenolol (TENORMIN) 25 MG tablet Take 12.5 mg by mouth 2 (two) times daily.      . Cholecalciferol (VITAMIN D3) 5000 UNITS CAPS Take 1 capsule by mouth daily. Take with food containing fats.  30 capsule    . Cyanocobalamin (VITAMIN B12 PO) Take 2 each by mouth daily. 5000units per dropperful      . diclofenac sodium (VOLTAREN) 1 % GEL Apply 2 g topically 2 (two) times daily.      Marland Kitchen doxycycline (VIBRA-TABS) 100 MG tablet Take 1 tablet (100 mg total) by mouth 2 (two) times daily.  20 tablet  0  . fexofenadine (ALLEGRA) 180 MG tablet Take 1 tablet (180 mg total) by mouth daily as needed. Allergies  90 tablet  3  . fluticasone (FLONASE) 50 MCG/ACT nasal spray Place 2 sprays into the nose as needed. Allergies      . guaifenesin (HUMIBID E) 400 MG TABS tablet Take 1t po q8h for 5 days then q8h PRN chest congestion  56 tablet  0  . ibuprofen (ADVIL,MOTRIN) 200 MG tablet Take 200 mg by mouth at bedtime as needed.      Marland Kitchen levothyroxine (SYNTHROID, LEVOTHROID) 75 MCG tablet Take 1 tablet (75 mcg total) by mouth daily before breakfast.  30 tablet  0  . omeprazole (PRILOSEC) 10 MG capsule Take 1 capsule (10 mg total) by mouth daily.  90 capsule  0  . simvastatin (ZOCOR) 10 MG tablet Take 1 tablet (10 mg total) by mouth every evening.  90 tablet  1   No current facility-administered medications for this visit.     Past Medical History    Diagnosis Date  . Supraventricular tachycardia, paroxysmal   . HTN (hypertension)   . Hyperlipidemia   . Postmenopausal HRT (hormone replacement therapy)   . Other seborrheic keratosis   . Vaginal pruritus   . Urinary frequency   . Diverticulosis of colon   . Blood in stool   . Pain in joint, lower leg   . Osteopenia   . IBS (irritable bowel syndrome)   . Hypothyroidism     TSH 14.488 (11/2005)  . H pylori ulcer     not treated due to expense  10/2001  . PPD positive     history +PPD 1984, no treatment, no abnormal CXR  . Posterior vitreous detachment 1996  . Multiple pigmented nevi     last derm evaluation 01/2003  . Postmenopausal     s/p hysterectomy for h/o cervical cancer 1982 (both ovaries taken at that time) now on hormonal replacement   . Shortness of breath     with ambulation  . Environmental allergies   . Lactose intolerance   . GERD (gastroesophageal reflux disease)   . Hand dermatitis   . Cervical cancer     s/p hysterectomy/oop  . PONV (postoperative nausea and vomiting)     "and takes me  a long time to come out under it" (02/08/2012)  . Pneumonia     "couple times in my lifetime" (02/08/2012)  . History of bronchitis     "w/a cold" (02/08/2012)  . Migraines     "get them very rarely now" (02/08/2012)  . OA (osteoarthritis)     multiple sites  . Arthritis     sed rate 10, RF, CCP pending  . DDD (degenerative disc disease), lumbosacral   . Osteoporosis     "borderline" (02/08/2012)  . Depression     Due to husbands passing away 09/22/2011    Past Surgical History  Procedure Laterality Date  . Eye surgery      cataract removal right eye  . Colonoscopy    . Total abdominal hysterectomy w/ bilateral salpingoophorectomy  1982  . Cataract extraction w/ intraocular lens implant  ?1997    right  . Total knee arthroplasty  02/08/2012    Procedure: TOTAL KNEE ARTHROPLASTY;  Surgeon: Hessie Dibble, MD;  Location: [facility];  Service: Orthopedics;   Laterality: Left;  . Cardiovascular stress test  12/07/11    Normal nuclear stress test    History   Social History  . Marital Status: Married    Spouse Name: N/A    Number of Children: 3  . Years of Education: N/A   Occupational History  . retired     Scientist, research (medical)   Social History Main Topics  . Smoking status: Former Smoker -- 1.00 packs/day for 25 years    Types: Cigarettes    Quit date: 04/17/1980  . Smokeless tobacco: Never Used     Comment: passive smoker, husband smoked  . Alcohol Use: No  . Drug Use: No  . Sexual Activity: No   Other Topics Concern  . Not on file   Social History Narrative   Lives with son, [name] who has medical problems-they help each other. Her husband died 02-Oct-2022 after 52 years of marriage.  She has 2 other sons that live within 2 hrs from her. She does not drive-she uses [facility]. She used to work in Scientist, research (medical), retired.     Tobacco: 25 pack yr hx, quit 1985.    ROS: no fevers or chills, productive cough, hemoptysis, dysphasia, odynophagia, melena, hematochezia, dysuria, hematuria, rash, seizure activity, orthopnea, PND, pedal edema, claudication. Remaining systems are negative.  Physical Exam: Well-developed well-nourished in no acute distress.  Skin is warm and dry.  HEENT is normal.  Neck is supple.  Chest is clear to auscultation with normal expansion.  Cardiovascular exam is regular rate and rhythm.  Abdominal exam nontender or distended. No masses palpated. Extremities show no edema. neuro grossly intact  ECG 07/25/13 Sinus rhythm at a rate of 56. Right bundle branch block.

## 2013-08-21 NOTE — Assessment & Plan Note (Signed)
Continue aspirin and statin. Follow-up carotid Dopplers April 2016. 

## 2013-08-25 ENCOUNTER — Other Ambulatory Visit: Payer: Self-pay | Admitting: *Deleted

## 2013-08-25 MED ORDER — LEVOTHYROXINE SODIUM 75 MCG PO TABS: 75.0000 ug | ORAL_TABLET | Freq: Every day | ORAL | Status: DC

## 2013-09-05 ENCOUNTER — Ambulatory Visit (INDEPENDENT_AMBULATORY_CARE_PROVIDER_SITE_OTHER): Payer: Commercial Managed Care - HMO | Admitting: Nurse Practitioner

## 2013-09-05 ENCOUNTER — Encounter: Payer: Self-pay | Admitting: Nurse Practitioner

## 2013-09-05 VITALS — BP 156/78 | HR 50 | Temp 98.6°F | Ht 62.0 in | Wt 146.0 lb

## 2013-09-05 DIAGNOSIS — M25561 Pain in right knee: Secondary | ICD-10-CM

## 2013-09-05 DIAGNOSIS — M25569 Pain in unspecified knee: Secondary | ICD-10-CM

## 2013-09-05 DIAGNOSIS — J069 Acute upper respiratory infection, unspecified: Secondary | ICD-10-CM

## 2013-09-05 DIAGNOSIS — E039 Hypothyroidism, unspecified: Secondary | ICD-10-CM

## 2013-09-05 DIAGNOSIS — E559 Vitamin D deficiency, unspecified: Secondary | ICD-10-CM

## 2013-09-05 DIAGNOSIS — E538 Deficiency of other specified B group vitamins: Secondary | ICD-10-CM

## 2013-09-05 DIAGNOSIS — E871 Hypo-osmolality and hyponatremia: Secondary | ICD-10-CM

## 2013-09-05 LAB — BASIC METABOLIC PANEL
BUN: 12 mg/dL (ref 6–23)
CO2: 29 mEq/L (ref 19–32)
CREATININE: 0.7 mg/dL (ref 0.4–1.2)
Calcium: 9.1 mg/dL (ref 8.4–10.5)
Chloride: 98 mEq/L (ref 96–112)
GFR: 92.11 mL/min (ref >60.00)
GLUCOSE: 82 mg/dL (ref 70–99)
Potassium: 4.8 mEq/L (ref 3.5–5.1)
Sodium: 135 mEq/L (ref 135–145)

## 2013-09-05 NOTE — Assessment & Plan Note (Addendum)
Pt cooks w/less salt & salt substitute due to son has had kidney transplant & needs salt restriction. Continue to Drink v8 daily. Use salt at table. Bmet today.

## 2013-09-05 NOTE — Assessment & Plan Note (Signed)
Pt has been supplementing w/oral B12 for 6 weeks. Check level today. Continues to feel fatigued. Looks best I've seen her, today: expressive, well-groomed, rested.

## 2013-09-05 NOTE — Assessment & Plan Note (Signed)
Levothyroxine increased to 56mcg from 50 mcg 6 weeks ago. Still bradycardic, but improved: 50 rather than 40. Slight improvement in energy. Also taking Bcomplex due to low B12. Check TSH level today. Continue med at 75 mcg, monitor results.

## 2013-09-05 NOTE — Patient Instructions (Addendum)
Daily sinus rinse for several weeks (Neilmed Sinus Rinse). If cough does not improve, let me know.  Consider reading Eat to Live by Excell Seltzer and begin implementing principles. Replace foods made with flour with plant foods like beans, peas, other vegetables and fresh fruit.  Continue to use Voltaren gel on knee up to 3 times daily for knee pain. Do knee strengthening exercises daily. Hold for 5 seconds, repeat 5 times, twice daily.  Continue to take B complex & D3 daily. Continue to drink V8 daily to prevent low sodium.  Keep neurology appointment. See you in September  The office will call with lab results. Knee Exercises EXERCISES RANGE OF MOTION(ROM) AND STRETCHING EXERCISES These exercises may help you when beginning to rehabilitate your injury. Your symptoms may resolve with or without further involvement from your physician, physical therapist or athletic trainer. While completing these exercises, remember:   Restoring tissue flexibility helps normal motion to return to the joints. This allows healthier, less painful movement and activity.  An effective stretch should be held for at least 30 seconds.  A stretch should never be painful. You should only feel a gentle lengthening or release in the stretched tissue. RANGE OF MOTION - Knee Flexion, Active  Lie on your back with both knees straight. (If this causes back discomfort, bend your opposite knee, placing your foot flat on the floor.)  Slowly slide your heel back toward your buttocks until you feel a gentle stretch in the front of your knee or thigh.  Hold for __________ seconds. Slowly slide your heel back to the starting position. Repeat __________ times. Complete this exercise __________ times per day.  STRETCH  Hamstrings, Supine   Lie on your back. Loop a belt or towel over the ball of your right / left foot.  Straighten your right / left knee and slowly pull on the belt to raise your leg. Do not allow the right  / left knee to bend. Keep your opposite leg flat on the floor.  Raise the leg until you feel a gentle stretch behind your right / left knee or thigh. Hold this position for __________ seconds. Repeat __________ times. Complete this stretch __________ times per day.  STRENGTHENING EXERCISES These exercises may help you when beginning to rehabilitate your injury. They may resolve your symptoms with or without further involvement from your physician, physical therapist or athletic trainer. While completing these exercises, remember:   Muscles can gain both the endurance and the strength needed for everyday activities through controlled exercises.  Complete these exercises as instructed by your physician, physical therapist or athletic trainer. Progress the resistance and repetitions only as guided.  You may experience muscle soreness or fatigue, but the pain or discomfort you are trying to eliminate should never worsen during these exercises. If this pain does worsen, stop and make certain you are following the directions exactly. If the pain is still present after adjustments, discontinue the exercise until you can discuss the trouble with your clinician. STRENGTH - Quadriceps, Isometrics  Lie on your back with your right / left leg extended and your opposite knee bent.  Gradually tense the muscles in the front of your right / left thigh. You should see either your knee cap slide up toward your hip or increased dimpling just above the knee. This motion will push the back of the knee down toward the floor/mat/bed on which you are lying.  Hold the muscle as tight as you can without increasing your pain  for __________ seconds.  Relax the muscles slowly and completely in between each repetition. Repeat __________ times. Complete this exercise __________ times per day.  STRENGTH - Quadriceps, Short Arcs   Lie on your back. Place a __________ inch towel roll under your knee so that the knee slightly  bends.  Raise only your lower leg by tightening the muscles in the front of your thigh. Do not allow your thigh to rise.  Hold this position for __________ seconds. Repeat __________ times. Complete this exercise __________ times per day.  STRENGTH - Quadriceps, Straight Leg Raises  Quality counts! Watch for signs that the quadriceps muscle is working to insure you are strengthening the correct muscles and not "cheating" by substituting with healthier muscles.  Lay on your back with your right / left leg extended and your opposite knee bent.  Tense the muscles in the front of your right / left thigh. You should see either your knee cap slide up or increased dimpling just above the knee. Your thigh may even quiver.  Tighten these muscles even more and raise your leg 4 to 6 inches off the floor. Hold for __________ seconds.  Keeping these muscles tense, lower your leg.  Relax the muscles slowly and completely in between each repetition. Repeat __________ times. Complete this exercise __________ times per day.  STRENGTH - Quadriceps, Wall Slides  Follow guidelines for form closely. Increased knee pain often results from poorly placed feet or knees.  Lean against a smooth wall or door and walk your feet out 18-24 inches. Place your feet hip-width apart.  Slowly slide down the wall or door until your knees bend __________ degrees.* Keep your knees over your heels, not your toes, and in line with your hips, not falling to either side.  Hold for __________ seconds. Stand up to rest for __________ seconds in between each repetition. Repeat __________ times. Complete this exercise __________ times per day. * Your physician, physical therapist or athletic trainer will alter this angle based on your symptoms and progress. Document Released: 02/15/2005 Document Revised: 06/26/2011 Document Reviewed: 07/16/2008 [facility] Patient Information 2014 [place], [place].

## 2013-09-05 NOTE — Assessment & Plan Note (Signed)
Afebrile. No Cp. Mild Effusion R ear. likley viral or allergies. Daily sinus rinses.

## 2013-09-05 NOTE — Progress Notes (Signed)
Subjective:     Amy Rodriguez is a 80 y.o. female who presents for follow up of knee pain. She has osteoarthritis w/Hx of L knee replacement. She was seen in office 2 weeks ago c/o R knee pain that interferes with exercise & ADLs. Knee was warm & swollen at medial aspect. NSAID use has been limited due to stomach pain. Intra-articular knee joint injection was performed, administering 40 mg depomedrol. She reports significant improvement in pain & has been able to resume ADLs with minimal discomfort. She is not limping today & gets up to exam table with ease. She is also using voltaren gel daily on both knees w/improvement in pain. She c/o tickle in throat w/cough and frequent clearing of throat. No fever, chest pain, or SOB. She continues to have mild fatigue. Synthroid was increased and B12 was found to be deficient several weeks ago. Synthroid waas adjusted and oral B complex stared.  Recent OV w/cardio. Reviewed note. F/u 1 yr-repeat carotid doppler studies. NoTx changes.   The following portions of the patient's history were reviewed and updated as appropriate: allergies, current medications, past family history, past medical history, past social history, past surgical history and problem list.  Review of Systems Pertinent items are noted in HPI.    Objective:    BP 156/78  Pulse 50  Temp(Src) 98.6 F (37 C) (Temporal)  Ht 5\' 2"  (1.575 m)  Wt 146 lb (66.225 kg)  BMI 26.70 kg/m2  SpO2 99% BP 156/78  Pulse 50  Temp(Src) 98.6 F (37 C) (Temporal)  Ht 5\' 2"  (1.575 m)  Wt 146 lb (66.225 kg)  BMI 26.70 kg/m2  SpO2 99% General appearance: alert, cooperative, appears stated age and no distress Head: Normocephalic, without obvious abnormality, atraumatic Eyes: negative findings: lids and lashes normal and conjunctivae and sclerae normal Ears: effusion R TM, bones visible bilat Nose: small amount purulent nasal drainage Throat: lips, mucosa, and tongue normal; teeth and gums  normal Lungs: clear to auscultation bilaterally Heart: regular rate and rhythm, S1, S2 normal, no murmur, click, rub or gallop Extremities: extremities normal, atraumatic, no cyanosis or edema Pulses: 2+ and symmetric    Assessment:     1. Hyponatremia   2. Vitamin B 12 deficiency   3. Hypothyroidism   4. Knee pain, right   5. VITAMIN D DEFICIENCY   6. URI or allergic rhinitis   Plan:   See problem list for complete A&P

## 2013-09-05 NOTE — Assessment & Plan Note (Signed)
Knee pain much improved after intra-articular steroid injection. She is no longer limping, has occasional pain, but ADLs no longer painful. Continue voltaren gel 3 times daily. Discussed hyalgan inj. Pt will consider. Daily exercises to strengthen muscles around knee.

## 2013-09-05 NOTE — Progress Notes (Signed)
Pre visit review using our clinic review tool, if applicable. No additional management support is needed unless otherwise documented below in the visit note. 

## 2013-09-05 NOTE — Assessment & Plan Note (Signed)
Continue 5000 iu D3 daily. Recheck level in September.

## 2013-09-09 ENCOUNTER — Other Ambulatory Visit: Payer: Self-pay

## 2013-09-19 ENCOUNTER — Other Ambulatory Visit (INDEPENDENT_AMBULATORY_CARE_PROVIDER_SITE_OTHER): Payer: Commercial Managed Care - HMO

## 2013-09-19 DIAGNOSIS — E538 Deficiency of other specified B group vitamins: Secondary | ICD-10-CM

## 2013-09-19 DIAGNOSIS — E039 Hypothyroidism, unspecified: Secondary | ICD-10-CM

## 2013-09-19 LAB — TSH: TSH: 0.78 u[IU]/mL (ref 0.35–4.50)

## 2013-09-19 LAB — VITAMIN B12: Vitamin B-12: 1040 pg/mL — ABNORMAL HIGH (ref 211–911)

## 2013-09-22 ENCOUNTER — Telehealth: Payer: Self-pay | Admitting: Nurse Practitioner

## 2013-09-22 DIAGNOSIS — E039 Hypothyroidism, unspecified: Secondary | ICD-10-CM

## 2013-09-22 MED ORDER — LEVOTHYROXINE SODIUM 75 MCG PO TABS: 75.0000 ug | ORAL_TABLET | Freq: Every day | ORAL | Status: DC

## 2013-09-22 NOTE — Telephone Encounter (Signed)
Left detailed message on pt's home vm. Per pt's DPR. 

## 2013-09-22 NOTE — Telephone Encounter (Signed)
pls call pt: Advise Lab are great! No change in thyroid meds. Continue w/B complex supplement.

## 2013-09-26 ENCOUNTER — Other Ambulatory Visit: Payer: Self-pay | Admitting: *Deleted

## 2013-09-26 DIAGNOSIS — E785 Hyperlipidemia, unspecified: Secondary | ICD-10-CM

## 2013-09-26 MED ORDER — SIMVASTATIN 10 MG PO TABS: 10.0000 mg | ORAL_TABLET | Freq: Every evening | ORAL | Status: DC

## 2013-10-27 ENCOUNTER — Ambulatory Visit: Payer: Self-pay | Admitting: Nurse Practitioner

## 2013-11-04 ENCOUNTER — Ambulatory Visit (INDEPENDENT_AMBULATORY_CARE_PROVIDER_SITE_OTHER): Payer: Commercial Managed Care - HMO | Admitting: Neurology

## 2013-11-04 ENCOUNTER — Encounter: Payer: Self-pay | Admitting: Neurology

## 2013-11-04 VITALS — BP 140/69 | HR 52 | Ht 62.0 in | Wt 142.0 lb

## 2013-11-04 DIAGNOSIS — R569 Unspecified convulsions: Secondary | ICD-10-CM

## 2013-11-04 DIAGNOSIS — G40109 Localization-related (focal) (partial) symptomatic epilepsy and epileptic syndromes with simple partial seizures, not intractable, without status epilepticus: Secondary | ICD-10-CM | POA: Insufficient documentation

## 2013-11-04 HISTORY — DX: Localization-related (focal) (partial) symptomatic epilepsy and epileptic syndromes with simple partial seizures, not intractable, without status epilepticus: G40.109

## 2013-11-04 NOTE — Progress Notes (Signed)
[facility] [address] [place]. [place] [address] [phone]       OFFICE CONSULT NOTE  [name] Date of Birth:  [birth year] Medical Record Number:  [id number]   Referring MD:  Cristal Ford  Reason for Referral:  TIA  HPI: 6 year is an elderly Caucasian lady who who on 07/25/13 who  developed transient episode of fleeting numbness starting in the right hand and spreading over a few minutes into right arm subsequently the face as well as left hand completely resolving within 5 minutes. Humerus were called but since her symptoms rapidly improved she chose not to go to the hospital. She called her primary care physician the next day her last ordered ER and she was hospitalized for observation and underwent MRI scan of the brain and MRA of the brain which have personally reviewed show no acute infarct but showed tiny 2 mm outpouching of the superior division of the right middle cerebral artery possibly a small asymptomatic aneurysm. Transthoracic echo showed normal ejection fraction without obvious correction of embolism. Carotid Dopplers revealed no significant obstructive stenosis. For lipid profile showed an LDL of 69. She was found to be slightly hypothyroid and temperature continued Synthroid. Hemoglobin A1c was 5.8. Patient was started on aspirin and has done well and has not had any further recurrent TIA or stroke symptoms. She has remote history of migraines and occasionally seemed jagged lights and patent but states during this episode she did not have those symptoms or any headaches. She has no prior history of strokes TIAs seizures. No prior history of significant head injury with loss of consciousness. No family history of epilepsy or seizures. Is no family history of intracranial aneurysms or subarachnoid hemorrhage.  ROS:   14 system review of systems is positive for ringing in ears, skin nose, cough, joint pain, allergies and also systems  negative  PMH:  Past Medical History  Diagnosis Date  . Supraventricular tachycardia, paroxysmal   . HTN (hypertension)   . Hyperlipidemia   . Postmenopausal HRT (hormone replacement therapy)   . Other seborrheic keratosis   . Vaginal pruritus   . Urinary frequency   . Diverticulosis of colon   . Blood in stool   . Pain in joint, lower leg   . Osteopenia   . IBS (irritable bowel syndrome)   . Hypothyroidism     TSH 14.488 (11/2005)  . H pylori ulcer     not treated due to expense  10/2001  . PPD positive     history +PPD 1984, no treatment, no abnormal CXR  . Posterior vitreous detachment 1996  . Multiple pigmented nevi     last derm evaluation 01/2003  . Postmenopausal     s/p hysterectomy for h/o cervical cancer 1982 (both ovaries taken at that time) now on hormonal replacement   . Shortness of breath     with ambulation  . Environmental allergies   . Lactose intolerance   . GERD (gastroesophageal reflux disease)   . Hand dermatitis   . Cervical cancer     s/p hysterectomy/oop  . PONV (postoperative nausea and vomiting)     "and takes me a long time to come out under it" (02/08/2012)  . Pneumonia     "couple times in my lifetime" (02/08/2012)  . History of bronchitis     "w/a cold" (02/08/2012)  . Migraines     "get them very rarely now" (02/08/2012)  . OA (osteoarthritis)  multiple sites  . Arthritis     sed rate 10, RF, CCP pending  . DDD (degenerative disc disease), lumbosacral   . Osteoporosis     "borderline" (02/08/2012)  . Depression     Due to husbands passing away 09/22/2011    Social History:  History   Social History  . Marital Status: Married    Spouse Name: N/A    Number of Children: 3  . Years of Education: 12TH   Occupational History  . retired     Scientist, research (medical)  .     Social History Main Topics  . Smoking status: Former Smoker -- 1.00 packs/day for 25 years    Types: Cigarettes    Quit date: 04/17/1980  . Smokeless tobacco: Never Used      Comment: passive smoker, husband smoked  . Alcohol Use: No  . Drug Use: No  . Sexual Activity: No   Other Topics Concern  . Not on file   Social History Narrative   Lives with son, [name] who has medical problems-they help each other. Her husband died 10/06/22 after 45 years of marriage.  She has 2 other sons that live within 2 hrs from her. She does not drive-she uses [facility]. She used to work in Scientist, research (medical), retired.     Tobacco: 25 pack yr hx, quit 1985.    Medications:   Current Outpatient Prescriptions on File Prior to Visit  Medication Sig Dispense Refill  . aspirin 325 MG tablet Take 1 tablet (325 mg total) by mouth daily.      Marland Kitchen atenolol (TENORMIN) 25 MG tablet Take 12.5 mg by mouth 2 (two) times daily.      . Cholecalciferol (VITAMIN D3) 5000 UNITS CAPS Take 1 capsule by mouth daily. Take with food containing fats.  30 capsule    . Cyanocobalamin (VITAMIN B12 PO) Take 2 each by mouth daily. 5000units per dropperful      . diclofenac sodium (VOLTAREN) 1 % GEL Apply 2 g topically 2 (two) times daily.      . fexofenadine (ALLEGRA) 180 MG tablet Take 1 tablet (180 mg total) by mouth daily as needed. Allergies  90 tablet  3  . fluticasone (FLONASE) 50 MCG/ACT nasal spray Place 2 sprays into the nose as needed. Allergies      . levothyroxine (SYNTHROID, LEVOTHROID) 75 MCG tablet Take 1 tablet (75 mcg total) by mouth daily before breakfast.  90 tablet  1  . omeprazole (PRILOSEC) 10 MG capsule Take 1 capsule (10 mg total) by mouth daily.  90 capsule  0  . simvastatin (ZOCOR) 10 MG tablet Take 1 tablet (10 mg total) by mouth every evening.  90 tablet  1   No current facility-administered medications on file prior to visit.    Allergies:   Allergies  Allergen Reactions  . Benzonatate Nausea And Vomiting    "tessalon pearl"  . Doxycycline Nausea And Vomiting  . Sulfonamide Derivatives Itching and Rash  . Amlodipine Other (See Comments)    Heart palpitations      Physical Exam General: well developed, well nourished, seated, in no evident distress Head: head normocephalic and atraumatic. Orohparynx benign Neck: supple with no carotid or supraclavicular bruits Cardiovascular: regular rate and rhythm, no murmurs Musculoskeletal: mild kyphosis Skin:  no rash/petichiae Vascular:  Normal pulses all extremities Filed Vitals:   11/04/13 1315  BP: 140/69  Pulse: 52    Neurologic Exam Mental Status: Awake and fully alert. Oriented to place and  time. Recent and remote memory intact. Attention span, concentration and fund of knowledge appropriate. Mood and affect appropriate.  Cranial Nerves: Fundoscopic exam reveals sharp disc margins. Pupils equal, briskly reactive to light. Extraocular movements full without nystagmus. Visual fields full to confrontation. Hearing intact. Facial sensation intact. Face, tongue, palate moves normally and symmetrically.  Motor: Normal bulk and tone. Normal strength in all tested extremity muscles. Sensory.: intact to tough and pinprick and vibratory sensation.  Coordination: Rapid alternating movements normal in all extremities. Finger-to-nose and heel-to-shin performed accurately bilaterally. Gait and Station: Arises from chair without difficulty. Stance is normal. Gait demonstrates normal stride length and balance . Able to heel, toe and tandem walk without difficulty.  Reflexes: 1+ and symmetric. Toes downgoing.   NIHSS  0 Modified Rankin  0   ASSESSMENT:  83 year Caucasian lady with transient fleeting paresthesias involving the right hand face and lips lasting 5 minutes and April 2015 possibly simple partial seizure doubt TIA for complaint of migraine. Incidental tiny asymptomatic right middle cerebral artery aneurysm.   PLAN:  I had a long discussion with the patient regarding her symptoms, discuss the results of her prior evaluation, reviewed imaging studies personally, discussed treatment plan and answered  questions. Check EEG for seizure activity. We will hold off on anticonvulsants at the present time unless the EEG is highly abnormal or she has more recurrent symptoms. Continue aspirin for stroke prevention with strict control of hypertension with blood pressure goal below 130/90 lipids with LDL cholesterol goal below 100 mg percent. Return for followup in 2 months:     Note: This document was prepared with digital dictation and possible smart phrase technology. Any transcriptional errors that result from this process are unintentional.

## 2013-11-04 NOTE — Patient Instructions (Signed)
I had a long discussion with the patient regarding her symptoms, discuss the results of her prior evaluation, reviewed imaging studies personally, discussed treatment plan and answered questions. Check EEG for seizure activity. We will hold off on anticonvulsants at the present time unless the EEG is highly abnormal or she has more recurrent symptoms. Continue aspirin for stroke prevention with strict control of hypertension with blood pressure goal below 130/90 lipids with LDL cholesterol goal below 100 mg percent. Return for followup in 2 months:

## 2013-11-13 ENCOUNTER — Other Ambulatory Visit (INDEPENDENT_AMBULATORY_CARE_PROVIDER_SITE_OTHER): Payer: Commercial Managed Care - HMO | Admitting: Radiology

## 2013-11-13 DIAGNOSIS — R569 Unspecified convulsions: Secondary | ICD-10-CM

## 2013-11-13 DIAGNOSIS — G40109 Localization-related (focal) (partial) symptomatic epilepsy and epileptic syndromes with simple partial seizures, not intractable, without status epilepticus: Secondary | ICD-10-CM

## 2013-12-11 ENCOUNTER — Telehealth: Payer: Self-pay | Admitting: *Deleted

## 2013-12-11 NOTE — Telephone Encounter (Signed)
Pt calling for EEG results.   I gave her the results of the EEG Normal.   She has f/u appt with Dr.Sethi 01-16-14.  She has had no other episodes.

## 2013-12-19 ENCOUNTER — Encounter: Payer: Self-pay | Admitting: Nurse Practitioner

## 2013-12-19 ENCOUNTER — Ambulatory Visit (INDEPENDENT_AMBULATORY_CARE_PROVIDER_SITE_OTHER): Payer: Commercial Managed Care - HMO | Admitting: Nurse Practitioner

## 2013-12-19 VITALS — BP 160/70 | HR 54 | Temp 97.4°F | Resp 18 | Ht 62.0 in | Wt 150.0 lb

## 2013-12-19 DIAGNOSIS — M25569 Pain in unspecified knee: Secondary | ICD-10-CM

## 2013-12-19 DIAGNOSIS — L918 Other hypertrophic disorders of the skin: Secondary | ICD-10-CM

## 2013-12-19 DIAGNOSIS — R7309 Other abnormal glucose: Secondary | ICD-10-CM

## 2013-12-19 DIAGNOSIS — L919 Hypertrophic disorder of the skin, unspecified: Secondary | ICD-10-CM

## 2013-12-19 DIAGNOSIS — L909 Atrophic disorder of skin, unspecified: Secondary | ICD-10-CM

## 2013-12-19 DIAGNOSIS — Z23 Encounter for immunization: Secondary | ICD-10-CM

## 2013-12-19 DIAGNOSIS — E871 Hypo-osmolality and hyponatremia: Secondary | ICD-10-CM

## 2013-12-19 DIAGNOSIS — E039 Hypothyroidism, unspecified: Secondary | ICD-10-CM

## 2013-12-19 DIAGNOSIS — M25561 Pain in right knee: Secondary | ICD-10-CM

## 2013-12-19 LAB — BASIC METABOLIC PANEL
BUN: 10 mg/dL (ref 6–23)
CALCIUM: 8.7 mg/dL (ref 8.4–10.5)
CHLORIDE: 100 meq/L (ref 96–112)
CO2: 27 meq/L (ref 19–32)
Creatinine, Ser: 0.9 mg/dL (ref 0.4–1.2)
GFR: 66.04 mL/min (ref >60.00)
GLUCOSE: 96 mg/dL (ref 70–99)
Potassium: 4.1 mEq/L (ref 3.5–5.1)
SODIUM: 134 meq/L — AB (ref 135–145)

## 2013-12-19 LAB — HEMOGLOBIN A1C: Hgb A1c MFr Bld: 6 % (ref 4.6–6.5)

## 2013-12-19 NOTE — Assessment & Plan Note (Signed)
No symptoms on current med-57mcg. Check TSH today.

## 2013-12-19 NOTE — Patient Instructions (Addendum)
Keep band aid on until bedtime. Skin care: wash with mild soap & water-Dove bar soap.  Diet changes:  Cut out refined sugar - anything that is sweet when you eat or drink it, except fresh fruit. Cut out white bread, rolls, biscuits, bagels, muffins, pasta and cereals. Choose Breads & cereals that have 4 gm or more of fiber per serving. Whole wheat pasta, brown rice and quinoa are good choices.   We will call with lab results.  See you in 3 months.

## 2013-12-19 NOTE — Assessment & Plan Note (Signed)
Continued to discuss diet changes to include eliminating  Refined sugars & flours. A1C today.

## 2013-12-19 NOTE — Progress Notes (Signed)
Pre visit review using our clinic review tool, if applicable. No additional management support is needed unless otherwise documented below in the visit note. 

## 2013-12-19 NOTE — Progress Notes (Signed)
Subjective:     Amy Rodriguez is a 80 y.o. femalepresents for f/u of hypothyroidism. She had sudden increase in TSH after stable for many years w/no med changes. I increased her to 75 mcg qd which normalized TSH. Will check again today to make sure stable. She denies symptoms of thyroid disease, but didn't have any when TSH was elevated. She also c/o multiple "moles". I gave her referral for derm many mos ago, but her ins would not cover provider. She has several seborrhiec keratoses on trunk & neck & a few skin tags that she wants removed. I will remove 1 tag on trunk today, others look irritated or more like keratoses. She is happy with results of steroid inj in R knee-Pain has resolved for several mos. & improved QOL. She has made some diet changes to decrease risk for diabetes as A1C was in pre-diabetic range. We discussed further diet changes. She had eval by neuro last mo. EEG performed, as provider felt event was seizure rather than TIA. Pt reports EEG nml. She has f/u visit next mo.  The following portions of the patient's history were reviewed and updated as appropriate: allergies, current medications, past medical history, past social history, past surgical history and problem list.  Review of Systems Pertinent items are noted in HPI.    Objective:    BP 160/70  Pulse 54  Temp(Src) 97.4 F (36.3 C) (Oral)  Resp 18  Ht 5\' 2"  (1.575 m)  Wt 150 lb (68.04 kg)  BMI 27.43 kg/m2  SpO2 95% BP 160/70  Pulse 54  Temp(Src) 97.4 F (36.3 C) (Oral)  Resp 18  Ht 5\' 2"  (1.575 m)  Wt 150 lb (68.04 kg)  BMI 27.43 kg/m2  SpO2 95% General appearance: alert, cooperative, appears stated age and no distress Head: Normocephalic, without obvious abnormality, atraumatic Eyes: negative findings: lids and lashes normal and conjunctivae and sclerae normal Lungs: clear to auscultation bilaterally Heart: few pauses, no murmur, bradycardia Extremities: extremities normal, atraumatic, no cyanosis  or edema Pulses: 2+ and symmetric Skin: multiple large seborhheic keratoses neck & trunk. Skin tag on abdomen.    Procedure: simple skin tag removed from abdomen using 10 blade. Lesion cleaned w/alcohol swab, sprayed w/ethyl chloride, numbed with 2% lidocaine w/epi using 25 ga needle. Lesion cleaned w/betadine. Excised using 10 blade Minimal bleeding. Pressure held until bleeding resolved. Band aid applied. Pt tolerated well.  Assessment:   1. Unspecified hypothyroidism - TSH  2. Elevated hemoglobin A1c - Hemoglobin A1c  3. Low sodium levels - Basic metabolic panel  4. Skin tag removed  See problem list for complete A&P See pt instructions. F/u 52mosTSH, A1C Flu vaccine today

## 2013-12-19 NOTE — Assessment & Plan Note (Signed)
No pain in R knee since steroid injection few mos ago.

## 2013-12-23 ENCOUNTER — Telehealth: Payer: Self-pay | Admitting: Nurse Practitioner

## 2013-12-23 LAB — TSH: TSH: 1.15 u[IU]/mL (ref 0.35–4.50)

## 2013-12-23 NOTE — Telephone Encounter (Signed)
Left message for pt to call back  °

## 2013-12-23 NOTE — Telephone Encounter (Signed)
pls call pt: Advise Thyroid looks good, no changes. Diabetes screen looks worse. She MUST cut out sugar, biscuits, bagels, muffins, white rice, and cereals that don't have at least 4 gm fiber. If she has questions, or isn't sure what to eat, she may schedule appointment. I will check level again in 3 mos. If it has not improved, we can discuss starting diabetes medicine.

## 2013-12-25 NOTE — Telephone Encounter (Signed)
Spoke with pt, advised lab results. Pt understood. 

## 2014-01-16 ENCOUNTER — Ambulatory Visit: Payer: Self-pay | Admitting: Neurology

## 2014-01-21 ENCOUNTER — Encounter: Payer: Self-pay | Admitting: Family Medicine

## 2014-01-21 ENCOUNTER — Ambulatory Visit (INDEPENDENT_AMBULATORY_CARE_PROVIDER_SITE_OTHER): Payer: Commercial Managed Care - HMO | Admitting: Family Medicine

## 2014-01-21 VITALS — BP 157/63 | HR 59 | Temp 99.1°F | Resp 16 | Ht 62.0 in | Wt 149.0 lb

## 2014-01-21 DIAGNOSIS — S29009A Unspecified injury of muscle and tendon of unspecified wall of thorax, initial encounter: Secondary | ICD-10-CM

## 2014-01-21 DIAGNOSIS — S29019A Strain of muscle and tendon of unspecified wall of thorax, initial encounter: Secondary | ICD-10-CM

## 2014-01-21 DIAGNOSIS — J018 Other acute sinusitis: Secondary | ICD-10-CM

## 2014-01-21 MED ORDER — AMOXICILLIN 875 MG PO TABS: 875.0000 mg | ORAL_TABLET | Freq: Two times a day (BID) | ORAL | Status: AC

## 2014-01-21 NOTE — Progress Notes (Signed)
Pre visit review using our clinic review tool, if applicable. No additional management support is needed unless otherwise documented below in the visit note. 

## 2014-01-21 NOTE — Progress Notes (Signed)
OFFICE VISIT  01/21/2014   CC:  Chief Complaint  Patient presents with  . Back Pain    left sided    HPI:    Patient is a 80 y.o. Caucasian female who presents for left sided pain.   Two week hx of pain on left mid back region, worse in morning and gets better as day goes on. Recalls that she had to open a difficult window not long after this started but nothing strenuous before onset of this pain.  NO nausea or fever or urinary complaints.  No radiation of the pain, no paresthesias.  She has not noted any rash.   Has had a cough pretty steady for 2 mo that she feels is related to nasal/sinus congestion and PND.  No wheezing or chest tightness or SOB.   No hx of falls.  Past Medical History  Diagnosis Date  . Supraventricular tachycardia, paroxysmal   . HTN (hypertension)   . Hyperlipidemia   . Postmenopausal HRT (hormone replacement therapy)   . Other seborrheic keratosis   . Vaginal pruritus   . Urinary frequency   . Diverticulosis of colon   . Blood in stool   . Pain in joint, lower leg   . Osteopenia   . IBS (irritable bowel syndrome)   . Hypothyroidism     TSH 14.488 (11/2005)  . H pylori ulcer     not treated due to expense  10/2001  . PPD positive     history +PPD 1984, no treatment, no abnormal CXR  . Posterior vitreous detachment 1996  . Multiple pigmented nevi     last derm evaluation 01/2003  . Postmenopausal     s/p hysterectomy for h/o cervical cancer 1982 (both ovaries taken at that time) now on hormonal replacement   . Shortness of breath     with ambulation  . Environmental allergies   . Lactose intolerance   . GERD (gastroesophageal reflux disease)   . Hand dermatitis   . Cervical cancer     s/p hysterectomy/oop  . PONV (postoperative nausea and vomiting)     "and takes me a long time to come out under it" (02/08/2012)  . Pneumonia     "couple times in my lifetime" (02/08/2012)  . History of bronchitis     "w/a cold" (02/08/2012)  . Migraines      "get them very rarely now" (02/08/2012)  . OA (osteoarthritis)     multiple sites  . Arthritis     sed rate 10, RF, CCP pending  . DDD (degenerative disc disease), lumbosacral   . Osteoporosis     "borderline" (02/08/2012)  . Depression     Due to husbands passing away 09/22/2011    Past Surgical History  Procedure Laterality Date  . Eye surgery      cataract removal right eye  . Colonoscopy    . Total abdominal hysterectomy w/ bilateral salpingoophorectomy  1982  . Cataract extraction w/ intraocular lens implant  ?1997    right  . Total knee arthroplasty  02/08/2012    Procedure: TOTAL KNEE ARTHROPLASTY;  Surgeon: Hessie Dibble, MD;  Location: [facility];  Service: Orthopedics;  Laterality: Left;  . Cardiovascular stress test  12/07/11    Normal nuclear stress test    Outpatient Prescriptions Prior to Visit  Medication Sig Dispense Refill  . atenolol (TENORMIN) 25 MG tablet Take 12.5 mg by mouth 2 (two) times daily.      . Cholecalciferol (VITAMIN D3)  5000 UNITS CAPS Take 1 capsule by mouth daily. Take with food containing fats.  30 capsule    . Cyanocobalamin (VITAMIN B12 PO) Take 2 each by mouth daily. 5000units per dropperful      . diclofenac sodium (VOLTAREN) 1 % GEL Apply 2 g topically 2 (two) times daily.      . fexofenadine (ALLEGRA) 180 MG tablet Take 1 tablet (180 mg total) by mouth daily as needed. Allergies  90 tablet  3  . fluticasone (FLONASE) 50 MCG/ACT nasal spray Place 2 sprays into the nose as needed. Allergies      . levothyroxine (SYNTHROID, LEVOTHROID) 75 MCG tablet Take 1 tablet (75 mcg total) by mouth daily before breakfast.  90 tablet  1  . omeprazole (PRILOSEC) 10 MG capsule Take 1 capsule (10 mg total) by mouth daily.  90 capsule  0  . simvastatin (ZOCOR) 10 MG tablet Take 1 tablet (10 mg total) by mouth every evening.  90 tablet  1  . aspirin 325 MG tablet Take 1 tablet (325 mg total) by mouth daily.       No facility-administered medications prior to  visit.    Allergies  Allergen Reactions  . Benzonatate Nausea And Vomiting    "tessalon pearl"  . Doxycycline Nausea And Vomiting  . Sulfonamide Derivatives Itching and Rash  . Amlodipine Other (See Comments)    Heart palpitations     ROS As per HPI  PE: Blood pressure 157/63, pulse 59, temperature 99.1 F (37.3 C), temperature source Temporal, resp. rate 16, height 5\' 2"  (1.575 m), weight 149 lb (67.586 kg), SpO2 93.00%. VS: noted--normal. Gen: alert, NAD, NONTOXIC APPEARING. HEENT: eyes without injection, drainage, or swelling.  Ears: EACs clear, TMs with normal light reflex and landmarks.  Nose: Clear rhinorrhea, with some dried, crusty exudate adherent to mildly injected mucosa.  No purulent d/c.  No paranasal sinus TTP.  No facial swelling.  Throat and mouth without focal lesion.  No pharyngial swelling, erythema, or exudate.   Neck: supple, no LAD.   LUNGS: CTA bilat, nonlabored resps.   CV: RRR, no m/r/g. EXT: no c/c/e SKIN: no rash BACK: ROM intact, with mild pulling sensation in left mid back when pt leans to the right and forward. TTP in focal region of soft tissue in left mid thoracic paraspinous mm's.  No rash.  No spinous process or facet joint tenderness.     LABS:  none  IMPRESSION AND PLAN:  1) Myofascial pain, focal (trigger point).  She chose conservative mgmt, with use of voltaren gel 2g qid prn, heat + stretching.   May return for trigger point injection if not improved in 10-14 d.  2) Prolonged URI/acute sinusitis.  Continue current symptomatic care and add amoxil 875mg  bid x 14d.  An After Visit Summary was printed and given to the patient.  FOLLOW UP: prn

## 2014-01-22 ENCOUNTER — Encounter: Payer: Self-pay | Admitting: Gastroenterology

## 2014-01-28 ENCOUNTER — Encounter: Payer: Self-pay | Admitting: Neurology

## 2014-01-28 ENCOUNTER — Ambulatory Visit (INDEPENDENT_AMBULATORY_CARE_PROVIDER_SITE_OTHER): Payer: Commercial Managed Care - HMO | Admitting: Neurology

## 2014-01-28 VITALS — BP 156/79 | HR 50 | Wt 149.6 lb

## 2014-01-28 DIAGNOSIS — R202 Paresthesia of skin: Secondary | ICD-10-CM

## 2014-01-28 NOTE — Progress Notes (Signed)
[facility] [address] [place]. [place] [address] [phone]       OFFICE FOLLOW UP VISIT NOTE  [name] Date of Birth:  Aug 09, 1933 Medical Record Number:  [id number]   Referring MD:  Amy Rodriguez  Reason for Referral:  TIA  HPI: 24 year is an elderly Caucasian lady who who on 07/25/13 who  developed transient episode of fleeting numbness starting in the right hand and spreading over a few minutes into right arm subsequently the face as well as left hand completely resolving within 5 minutes. Humerus were called but since her symptoms rapidly improved she chose not to go to the hospital. She called her primary care physician the next day her last ordered ER and she was hospitalized for observation and underwent MRI scan of the brain and MRA of the brain which have personally reviewed show no acute infarct but showed tiny 2 mm outpouching of the superior division of the right middle cerebral artery possibly a small asymptomatic aneurysm. Transthoracic echo showed normal ejection fraction without obvious correction of embolism. Carotid Dopplers revealed no significant obstructive stenosis. For lipid profile showed an LDL of 69. She was found to be slightly hypothyroid and temperature continued Synthroid. Hemoglobin A1c was 5.8. Patient was started on aspirin and has done well and has not had any further recurrent TIA or stroke symptoms. She has remote history of migraines and occasionally seemed jagged lights and patent but states during this episode she did not have those symptoms or any headaches. She has no prior history of strokes TIAs seizures. No prior history of significant head injury with loss of consciousness. No family history of epilepsy or seizures. Is no family history of intracranial aneurysms or subarachnoid hemorrhage. Update 01/28/2014 : She returns for followup of for the 3 months ago. She is doing well without recurrent episodes of paresthesias or  tingling. She underwent EEG horn 11/13/2013 which was normal. She discontinued aspirin as she had bleeding for the mole removal for today's. She states her blood pressure is under good control but it is elevated today at 158/79 she blames this on white coat hypertension. She is tolerating Zocor well without any side effects. She complains of mild ringing in the ears as well as short-term memory difficulties but these are not progressive or bothersome. ROS:   14 system review of systems is positive for ringing in ears, cough, palpitations, environmental allergies, joint pain back pain, moles and itching and all the systems negative PMH:  Past Medical History  Diagnosis Date  . Supraventricular tachycardia, paroxysmal   . HTN (hypertension)   . Hyperlipidemia   . Postmenopausal HRT (hormone replacement therapy)   . Other seborrheic keratosis   . Vaginal pruritus   . Urinary frequency   . Diverticulosis of colon   . Blood in stool   . Pain in joint, lower leg   . Osteopenia   . IBS (irritable bowel syndrome)   . Hypothyroidism     TSH 14.488 (11/2005)  . H pylori ulcer     not treated due to expense  10/2001  . PPD positive     history +PPD 1984, no treatment, no abnormal CXR  . Posterior vitreous detachment 1996  . Multiple pigmented nevi     last derm evaluation 01/2003  . Postmenopausal     s/p hysterectomy for h/o cervical cancer 1982 (both ovaries taken at that time) now on hormonal replacement   . Shortness of breath  with ambulation  . Environmental allergies   . Lactose intolerance   . GERD (gastroesophageal reflux disease)   . Hand dermatitis   . Cervical cancer     s/p hysterectomy/oop  . PONV (postoperative nausea and vomiting)     "and takes me a long time to come out under it" (02/08/2012)  . Pneumonia     "couple times in my lifetime" (02/08/2012)  . History of bronchitis     "w/a cold" (02/08/2012)  . Migraines     "get them very rarely now" (02/08/2012)  . OA  (osteoarthritis)     multiple sites  . Arthritis     sed rate 10, RF, CCP pending  . DDD (degenerative disc disease), lumbosacral   . Osteoporosis     "borderline" (02/08/2012)  . Depression     Due to husbands passing away 09/22/2011    Social History:  History   Social History  . Marital Status: Married    Spouse Name: N/A    Number of Children: 3  . Years of Education: 12TH   Occupational History  . retired     Scientist, research (medical)  .     Social History Main Topics  . Smoking status: Former Smoker -- 1.00 packs/day for 25 years    Types: Cigarettes    Quit date: 04/17/1980  . Smokeless tobacco: Never Used     Comment: passive smoker, husband smoked  . Alcohol Use: No  . Drug Use: No  . Sexual Activity: No   Other Topics Concern  . Not on file   Social History Narrative   Lives with son, Amy Rodriguez who has medical problems-they help each other. Her husband died 16-Oct-2022 after 67 years of marriage.  She has 2 other sons that live within 2 hrs from her. She does not drive-she uses [facility]. She used to work in Scientist, research (medical), retired.     Tobacco: 25 pack yr hx, quit 1985.    Medications:   Current Outpatient Prescriptions on File Prior to Visit  Medication Sig Dispense Refill  . amoxicillin (AMOXIL) 875 MG tablet Take 1 tablet (875 mg total) by mouth 2 (two) times daily.  28 tablet  0  . atenolol (TENORMIN) 25 MG tablet Take 12.5 mg by mouth 2 (two) times daily.      . Cholecalciferol (VITAMIN D3) 5000 UNITS CAPS Take 1 capsule by mouth daily. Take with food containing fats.  30 capsule    . Cyanocobalamin (VITAMIN B12 PO) Take 2 each by mouth daily. 5000units per dropperful      . diclofenac sodium (VOLTAREN) 1 % GEL Apply 2 g topically 2 (two) times daily.      . fexofenadine (ALLEGRA) 180 MG tablet Take 1 tablet (180 mg total) by mouth daily as needed. Allergies  90 tablet  3  . fluticasone (FLONASE) 50 MCG/ACT nasal spray Place 2 sprays into the nose as needed. Allergies      .  levothyroxine (SYNTHROID, LEVOTHROID) 75 MCG tablet Take 1 tablet (75 mcg total) by mouth daily before breakfast.  90 tablet  1  . omeprazole (PRILOSEC) 10 MG capsule Take 1 capsule (10 mg total) by mouth daily.  90 capsule  0  . simvastatin (ZOCOR) 10 MG tablet Take 1 tablet (10 mg total) by mouth every evening.  90 tablet  1   No current facility-administered medications on file prior to visit.    Allergies:   Allergies  Allergen Reactions  . Benzonatate Nausea And  Vomiting    "tessalon pearl"  . Doxycycline Nausea And Vomiting  . Sulfonamide Derivatives Itching and Rash  . Amlodipine Other (See Comments)    Heart palpitations     Physical Exam General: well developed, well nourished pleasant elderly Caucasian lady, seated, in no evident distress Head: head normocephalic and atraumatic. Orohparynx benign Neck: supple with no carotid or supraclavicular bruits Cardiovascular: regular rate and rhythm, no murmurs Musculoskeletal: mild kyphosis Skin:  no rash/petichiae Vascular:  Normal pulses all extremities Filed Vitals:   01/28/14 1355  BP: 156/79  Pulse: 50    Neurologic Exam Mental Status: Awake and fully alert. Oriented to place and time. Recent and remote memory intact. Attention span, concentration and fund of knowledge appropriate. Mood and affect appropriate.  Cranial Nerves: Fundoscopic exam not done  Pupils equal, briskly reactive to light. Extraocular movements full without nystagmus. Visual fields full to confrontation. Hearing intact. Facial sensation intact. Face, tongue, palate moves normally and symmetrically.  Motor: Normal bulk and tone. Normal strength in all tested extremity muscles. Sensory.: intact to tough and pinprick and vibratory sensation.  Coordination: Rapid alternating movements normal in all extremities. Finger-to-nose and heel-to-shin performed accurately bilaterally. Gait and Station: Arises from chair without difficulty. Stance is normal. Gait  demonstrates normal stride length and balance . Able to heel, toe and tandem walk without difficulty.  Reflexes: 1+ and symmetric. Toes downgoing.   ASSESSMENT:  4 year Caucasian lady with transient fleeting paresthesias involving the right hand face and lips lasting 5 minutes and April 2015 possibly simple partial seizure doubt TIA or complicated migraine. Incidental tiny asymptomatic right middle cerebral artery aneurysm.   PLAN: I had a long discussion with the patient with regards to her single episode of transient episode of paresthesias and discussed the results of brain imaging studies as well as EEG which I have personally reviewed and is normal. Eyes encourage her to take aspirin 81 mg daily and she had some bleeding on 325 mg of aspirin. Maintain strict control of hypertension with blood pressure goal below 130/90 and lipids with LDL cholesterol goal below 120 mg percent. Continue conservative followup for the right MCA aneurysm and will check repeat MRA upon followup visit .Return for followup in 6 months with Charlott Holler,, NP earlier if necessary.   Note: This document was prepared with digital dictation and possible smart phrase technology. Any transcriptional errors that result from this process are unintentional.

## 2014-01-28 NOTE — Patient Instructions (Addendum)
I had a long discussion with the patient with regards to her single episode of transient episode of paresthesias and discussed the results of brain imaging studies as well as EEG which I have personally reviewed and is normal. Eyes encourage her to take aspirin 81 mg daily and she had some bleeding on 325 mg of aspirin. Maintain strict control of hypertension with blood pressure goal below 130/90 and lipids with LDL cholesterol goal below 120 mg percent. Continue conservative followup for the right MCA aneurysm and will check repeat MRA upon followup visit .Return for followup in 6 months with Charlott Holler,, NP earlier if necessary.

## 2014-03-01 ENCOUNTER — Emergency Department (HOSPITAL_COMMUNITY)
Admission: EM | Admit: 2014-03-01 | Discharge: 2014-03-01 | Disposition: A | Discharge status: Home / Self Care | Payer: Commercial Managed Care - HMO | Attending: Emergency Medicine | Admitting: Emergency Medicine

## 2014-03-01 ENCOUNTER — Emergency Department (INDEPENDENT_AMBULATORY_CARE_PROVIDER_SITE_OTHER)
Admission: EM | Admit: 2014-03-01 | Discharge: 2014-03-01 | Disposition: A | Discharge status: Nursing Home (Includes ICF) | Payer: Medicare HMO | Source: Home / Self Care | Attending: Emergency Medicine | Admitting: Emergency Medicine

## 2014-03-01 ENCOUNTER — Encounter (HOSPITAL_COMMUNITY): Payer: Self-pay | Admitting: *Deleted

## 2014-03-01 ENCOUNTER — Encounter (HOSPITAL_COMMUNITY): Payer: Self-pay

## 2014-03-01 DIAGNOSIS — Z7951 Long term (current) use of inhaled steroids: Secondary | ICD-10-CM | POA: Diagnosis not present

## 2014-03-01 DIAGNOSIS — E039 Hypothyroidism, unspecified: Secondary | ICD-10-CM | POA: Insufficient documentation

## 2014-03-01 DIAGNOSIS — E785 Hyperlipidemia, unspecified: Secondary | ICD-10-CM | POA: Diagnosis not present

## 2014-03-01 DIAGNOSIS — Z79899 Other long term (current) drug therapy: Secondary | ICD-10-CM | POA: Insufficient documentation

## 2014-03-01 DIAGNOSIS — R630 Anorexia: Secondary | ICD-10-CM | POA: Diagnosis not present

## 2014-03-01 DIAGNOSIS — R1032 Left lower quadrant pain: Secondary | ICD-10-CM | POA: Diagnosis not present

## 2014-03-01 DIAGNOSIS — Z8619 Personal history of other infectious and parasitic diseases: Secondary | ICD-10-CM | POA: Diagnosis not present

## 2014-03-01 DIAGNOSIS — Z8709 Personal history of other diseases of the respiratory system: Secondary | ICD-10-CM | POA: Diagnosis not present

## 2014-03-01 DIAGNOSIS — I10 Essential (primary) hypertension: Secondary | ICD-10-CM | POA: Insufficient documentation

## 2014-03-01 DIAGNOSIS — R195 Other fecal abnormalities: Secondary | ICD-10-CM

## 2014-03-01 DIAGNOSIS — R509 Fever, unspecified: Secondary | ICD-10-CM

## 2014-03-01 DIAGNOSIS — Z791 Long term (current) use of non-steroidal anti-inflammatories (NSAID): Secondary | ICD-10-CM | POA: Diagnosis not present

## 2014-03-01 DIAGNOSIS — G43909 Migraine, unspecified, not intractable, without status migrainosus: Secondary | ICD-10-CM | POA: Diagnosis not present

## 2014-03-01 DIAGNOSIS — K219 Gastro-esophageal reflux disease without esophagitis: Secondary | ICD-10-CM | POA: Insufficient documentation

## 2014-03-01 DIAGNOSIS — Z9889 Other specified postprocedural states: Secondary | ICD-10-CM | POA: Insufficient documentation

## 2014-03-01 DIAGNOSIS — M1990 Unspecified osteoarthritis, unspecified site: Secondary | ICD-10-CM | POA: Diagnosis not present

## 2014-03-01 DIAGNOSIS — Z872 Personal history of diseases of the skin and subcutaneous tissue: Secondary | ICD-10-CM | POA: Insufficient documentation

## 2014-03-01 DIAGNOSIS — Z8701 Personal history of pneumonia (recurrent): Secondary | ICD-10-CM | POA: Insufficient documentation

## 2014-03-01 DIAGNOSIS — Z8659 Personal history of other mental and behavioral disorders: Secondary | ICD-10-CM | POA: Insufficient documentation

## 2014-03-01 DIAGNOSIS — Z8589 Personal history of malignant neoplasm of other organs and systems: Secondary | ICD-10-CM | POA: Insufficient documentation

## 2014-03-01 DIAGNOSIS — Z8541 Personal history of malignant neoplasm of cervix uteri: Secondary | ICD-10-CM | POA: Diagnosis not present

## 2014-03-01 DIAGNOSIS — R197 Diarrhea, unspecified: Secondary | ICD-10-CM

## 2014-03-01 DIAGNOSIS — R42 Dizziness and giddiness: Secondary | ICD-10-CM | POA: Insufficient documentation

## 2014-03-01 DIAGNOSIS — R110 Nausea: Secondary | ICD-10-CM | POA: Diagnosis not present

## 2014-03-01 DIAGNOSIS — Z87891 Personal history of nicotine dependence: Secondary | ICD-10-CM | POA: Diagnosis not present

## 2014-03-01 DIAGNOSIS — K921 Melena: Secondary | ICD-10-CM

## 2014-03-01 LAB — CBC WITH DIFFERENTIAL/PLATELET
Basophils Absolute: 0 10*3/uL (ref 0.0–0.1)
Basophils Relative: 0 % (ref 0–1)
EOS PCT: 0 % (ref 0–5)
Eosinophils Absolute: 0 10*3/uL (ref 0.0–0.7)
HEMATOCRIT: 38.1 % (ref 36.0–46.0)
Hemoglobin: 13.2 g/dL (ref 12.0–15.0)
LYMPHS ABS: 1.2 10*3/uL (ref 0.7–4.0)
Lymphocytes Relative: 11 % — ABNORMAL LOW (ref 12–46)
MCH: 28.6 pg (ref 26.0–34.0)
MCHC: 34.6 g/dL (ref 30.0–36.0)
MCV: 82.6 fL (ref 78.0–100.0)
MONO ABS: 1.9 10*3/uL — AB (ref 0.1–1.0)
Monocytes Relative: 18 % — ABNORMAL HIGH (ref 3–12)
Neutro Abs: 7.5 10*3/uL (ref 1.7–7.7)
Neutrophils Relative %: 71 % (ref 43–77)
PLATELETS: 177 10*3/uL (ref 150–400)
RBC: 4.61 MIL/uL (ref 3.87–5.11)
RDW: 12.6 % (ref 11.5–15.5)
WBC: 10.5 10*3/uL (ref 4.0–10.5)

## 2014-03-01 LAB — URINE MICROSCOPIC-ADD ON

## 2014-03-01 LAB — POCT I-STAT, CHEM 8
BUN: 19 mg/dL (ref 6–23)
CREATININE: 0.9 mg/dL (ref 0.50–1.10)
Calcium, Ion: 1.05 mmol/L — ABNORMAL LOW (ref 1.13–1.30)
Chloride: 92 mEq/L — ABNORMAL LOW (ref 96–112)
GLUCOSE: 114 mg/dL — AB (ref 70–99)
HCT: 47 % — ABNORMAL HIGH (ref 36.0–46.0)
HEMOGLOBIN: 16 g/dL — AB (ref 12.0–15.0)
POTASSIUM: 3.5 meq/L — AB (ref 3.7–5.3)
Sodium: 127 mEq/L — ABNORMAL LOW (ref 137–147)
TCO2: 21 mmol/L (ref 0–100)

## 2014-03-01 LAB — URINALYSIS, ROUTINE W REFLEX MICROSCOPIC
Bilirubin Urine: NEGATIVE
GLUCOSE, UA: NEGATIVE mg/dL
KETONES UR: 15 mg/dL — AB
Nitrite: NEGATIVE
PROTEIN: NEGATIVE mg/dL
Specific Gravity, Urine: 1.007 (ref 1.005–1.030)
Urobilinogen, UA: 0.2 mg/dL (ref 0.0–1.0)
pH: 6 (ref 5.0–8.0)

## 2014-03-01 LAB — COMPREHENSIVE METABOLIC PANEL
ALT: 10 U/L (ref 0–35)
ANION GAP: 17 — AB (ref 5–15)
AST: 17 U/L (ref 0–37)
Albumin: 3.3 g/dL — ABNORMAL LOW (ref 3.5–5.2)
Alkaline Phosphatase: 83 U/L (ref 39–117)
BUN: 18 mg/dL (ref 6–23)
CALCIUM: 8.2 mg/dL — AB (ref 8.4–10.5)
CO2: 21 mEq/L (ref 19–32)
Chloride: 94 mEq/L — ABNORMAL LOW (ref 96–112)
Creatinine, Ser: 0.82 mg/dL (ref 0.50–1.10)
GFR, EST AFRICAN AMERICAN: 77 mL/min — AB (ref >90)
GFR, EST NON AFRICAN AMERICAN: 67 mL/min — AB (ref >90)
GLUCOSE: 97 mg/dL (ref 70–99)
Potassium: 4.1 mEq/L (ref 3.7–5.3)
SODIUM: 132 meq/L — AB (ref 137–147)
Total Bilirubin: 0.6 mg/dL (ref 0.3–1.2)
Total Protein: 7.3 g/dL (ref 6.0–8.3)

## 2014-03-01 LAB — LIPASE, BLOOD: Lipase: 25 U/L (ref 11–59)

## 2014-03-01 LAB — OCCULT BLOOD, POC DEVICE: Fecal Occult Bld: POSITIVE — AB

## 2014-03-01 LAB — I-STAT CG4 LACTIC ACID, ED: Lactic Acid, Venous: 1.54 mmol/L (ref 0.5–2.2)

## 2014-03-01 MED ORDER — SODIUM CHLORIDE 0.9 % IV BOLUS (SEPSIS)
1000.0000 mL | Freq: Once | INTRAVENOUS | Status: AC
  Administered 2014-03-01: 1000 mL via INTRAVENOUS

## 2014-03-01 MED ORDER — ONDANSETRON 4 MG PO TBDP: 4.0000 mg | ORAL_TABLET | Freq: Three times a day (TID) | ORAL | Status: DC | PRN

## 2014-03-01 NOTE — ED Notes (Signed)
[facility] called to take pt. today.

## 2014-03-01 NOTE — ED Notes (Signed)
Pt removed from bedpan.

## 2014-03-01 NOTE — Discharge Instructions (Signed)
Take the prescribed medication as directed.  Continue to drink plenty of fluids and eat small meals.  May wish to start with bland diet and progress back to normal as tolerated. Follow-up with your primary care physician this week. Return to the ED for new or worsening symptoms.

## 2014-03-01 NOTE — ED Notes (Signed)
Report given to [facility]. 

## 2014-03-01 NOTE — ED Provider Notes (Signed)
CSN: [id number]     Arrival date & time 03/01/14  1531 History   First MD Initiated Contact with Patient 03/01/14 1535     Chief Complaint  Patient presents with  . Diarrhea     (Consider location/radiation/quality/duration/timing/severity/associated sxs/prior Treatment) Patient is a 80 y.o. female presenting with diarrhea. The history is provided by the patient and medical records.  Diarrhea Associated symptoms: abdominal pain and fever    This is a 80 y.o. F with PMH significant for HTN, HLP, hypothyroidism, paroxysmal SVT, GERD, IBS, diverticulosis, presenting to the ED for diarrhea for the past 4 days.  Stools are described as loose, dark and tarry in color.  Denies hematochezia.  Estimates she had approx 10 stools yesterday.  She has had little PO intake due to decreased appetite. She does have some lightheadedness upon standing and ambulating but states she thinks it is because she is dehydrated. She denies headache, dizziness, numbness, paresthesias of extremities.  Patient was recently on amoxicillin 3 weeks ago for a sinus infection.  States she has been having some continues nasal congestion which was improved with OTC sinus medications.  She denies chest pain, SOB, or palpitations.  Fevers at home have ranged from 99-102F.  Denies nausea or vomiting.  States she has minimal abdominal pain, mostly feels uncomfortable from repeated bowel movements.  Colonoscopy in 2009 by Dr. Deatra Ina for further eval of occult blood in stools revealing diffuse diverticulosis, worse in left colon.  Past Medical History  Diagnosis Date  . Supraventricular tachycardia, paroxysmal   . HTN (hypertension)   . Hyperlipidemia   . Postmenopausal HRT (hormone replacement therapy)   . Other seborrheic keratosis   . Vaginal pruritus   . Urinary frequency   . Diverticulosis of colon   . Blood in stool   . Pain in joint, lower leg   . Osteopenia   . IBS (irritable bowel syndrome)   . Hypothyroidism     TSH  14.488 (11/2005)  . H pylori ulcer     not treated due to expense  10/2001  . PPD positive     history +PPD 1984, no treatment, no abnormal CXR  . Posterior vitreous detachment 1996  . Multiple pigmented nevi     last derm evaluation 01/2003  . Postmenopausal     s/p hysterectomy for h/o cervical cancer 1982 (both ovaries taken at that time) now on hormonal replacement   . Shortness of breath     with ambulation  . Environmental allergies   . Lactose intolerance   . GERD (gastroesophageal reflux disease)   . Hand dermatitis   . Cervical cancer     s/p hysterectomy/oop  . PONV (postoperative nausea and vomiting)     "and takes me a long time to come out under it" (02/08/2012)  . Pneumonia     "couple times in my lifetime" (02/08/2012)  . History of bronchitis     "w/a cold" (02/08/2012)  . Migraines     "get them very rarely now" (02/08/2012)  . OA (osteoarthritis)     multiple sites  . Arthritis     sed rate 10, RF, CCP pending  . DDD (degenerative disc disease), lumbosacral   . Osteoporosis     "borderline" (02/08/2012)  . Depression     Due to husbands passing away 09/22/2011   Past Surgical History  Procedure Laterality Date  . Eye surgery      cataract removal right eye  . Colonoscopy    .  Total abdominal hysterectomy w/ bilateral salpingoophorectomy  1982  . Cataract extraction w/ intraocular lens implant  ?1997    right  . Total knee arthroplasty  02/08/2012    Procedure: TOTAL KNEE ARTHROPLASTY;  Surgeon: Hessie Dibble, MD;  Location: [place];  Service: Orthopedics;  Laterality: Left;  . Cardiovascular stress test  12/07/11    Normal nuclear stress test  . Abdominal hysterectomy  1982   Family History  Problem Relation Age of Onset  . Breast cancer Mother 59  . Lung cancer Brother 27  . Cancer Brother     lung  . Heart failure Father     congestive  . Heart disease Father   . Epilepsy Son   . Kidney disease Son     tuberous sclerosis, both kidneys  removed, has transplant  . Diabetes Maternal Uncle   . Diabetes Paternal Aunt   . Cancer Maternal Grandmother     breast  . Heart disease Son     [place]  . COPD Son    History  Substance Use Topics  . Smoking status: Former Smoker -- 1.00 packs/day for 25 years    Types: Cigarettes    Quit date: 04/17/1980  . Smokeless tobacco: Never Used     Comment: passive smoker, husband smoked  . Alcohol Use: No   OB History    No data available     Review of Systems  Constitutional: Positive for fever and appetite change.  Gastrointestinal: Positive for nausea, abdominal pain and diarrhea.  Neurological: Positive for light-headedness.  All other systems reviewed and are negative.     Allergies  Benzonatate; Doxycycline; Sulfonamide derivatives; and Amlodipine  Home Medications   Prior to Admission medications   Medication Sig Start Date End Date Taking? Authorizing Provider  atenolol (TENORMIN) 25 MG tablet Take 12.5 mg by mouth 2 (two) times daily.    Historical Provider, MD  Cholecalciferol (VITAMIN D3) 5000 UNITS CAPS Take 1 capsule by mouth daily. Take with food containing fats. 12/18/12   Irene Pap, NP  Cyanocobalamin (VITAMIN B12 PO) Take 2 each by mouth daily. 5000units per dropperful    Historical Provider, MD  diclofenac sodium (VOLTAREN) 1 % GEL Apply 2 g topically 2 (two) times daily.    Historical Provider, MD  fexofenadine (ALLEGRA) 180 MG tablet Take 1 tablet (180 mg total) by mouth daily as needed. Allergies 11/28/12   Josalyn C Funches, MD  fluticasone (FLONASE) 50 MCG/ACT nasal spray Place 2 sprays into the nose as needed. Allergies    Historical Provider, MD  levothyroxine (SYNTHROID, LEVOTHROID) 75 MCG tablet Take 1 tablet (75 mcg total) by mouth daily before breakfast. 09/22/13   Irene Pap, NP  omeprazole (PRILOSEC) 10 MG capsule Take 1 capsule (10 mg total) by mouth daily. 04/01/13   Irene Pap, NP  simvastatin (ZOCOR) 10 MG tablet Take 1  tablet (10 mg total) by mouth every evening. 09/26/13   Irene Pap, NP   BP 159/70 mmHg  Pulse 74  Temp(Src) 98.2 F (36.8 C) (Oral)  Resp 24  Ht 5\' 2"  (1.575 m)  Wt 145 lb (65.772 kg)  BMI 26.51 kg/m2  SpO2 96%   Physical Exam  Constitutional: She is oriented to person, place, and time. She appears well-developed and well-nourished.  Elderly, appears unwell  HENT:  Head: Normocephalic and atraumatic.  Mouth/Throat: Oropharynx is clear and moist.  Mucous membranes mildly dry  Eyes: Conjunctivae and EOM are normal. Pupils  are equal, round, and reactive to light.  Neck: Normal range of motion.  Cardiovascular: Normal rate, regular rhythm and normal heart sounds.   Pulmonary/Chest: Effort normal and breath sounds normal.  Abdominal: Soft. Bowel sounds are normal. There is tenderness in the left lower quadrant. There is no rigidity and no guarding.  Abdomen soft, non-distended, very mild focal tenderness in left lower quadrant  Musculoskeletal: Normal range of motion.  Neurological: She is alert and oriented to person, place, and time.  Skin: Skin is warm and dry.  Psychiatric: She has a normal mood and affect.  Nursing note and vitals reviewed.   ED Course  Procedures (including critical care time) Labs Review Labs Reviewed  CBC WITH DIFFERENTIAL - Abnormal; Notable for the following:    Lymphocytes Relative 11 (*)    Monocytes Relative 18 (*)    Monocytes Absolute 1.9 (*)    All other components within normal limits  COMPREHENSIVE METABOLIC PANEL - Abnormal; Notable for the following:    Sodium 132 (*)    Chloride 94 (*)    Calcium 8.2 (*)    Albumin 3.3 (*)    GFR calc non Af Amer 67 (*)    GFR calc Af Amer 77 (*)    Anion gap 17 (*)    All other components within normal limits  URINALYSIS, ROUTINE W REFLEX MICROSCOPIC - Abnormal; Notable for the following:    Hgb urine dipstick MODERATE (*)    Ketones, ur 15 (*)    Leukocytes, UA SMALL (*)    All other  components within normal limits  URINE MICROSCOPIC-ADD ON - Abnormal; Notable for the following:    Squamous Epithelial / LPF FEW (*)    All other components within normal limits  CLOSTRIDIUM DIFFICILE BY PCR  LIPASE, BLOOD  I-STAT CG4 LACTIC ACID, ED    Imaging Review No results found.   EKG Interpretation None      MDM   Final diagnoses:  Diarrhea  Occult blood in stools   80 y.o. F with diarrhea over the past week.  Describes episodes as dark and watery.  Was found to have occult positive blood in stool at urgent care and sent here for further evaluation.  Patient does have hx of diverticulosis as well as recurrent occult blood in her stools which has been evaluated by GI, Dr. Deatra Ina.  On arrival, patient afebrile and non-toxic in appearance.  She denies current complaints other than some fatigue.  Her abdominal exam is overall benign, minimal tenderness in her LLQ.  Will obtains labs and u/a.  Patient given IV fluids, declined pain or nausea meds.  Will attempt to obtain c. Diff culture given her recent abx use.  Labs with normal WBC count, minimal electrolyte abnormalities, anion gap of 17. H/H stable.  After fluids patient states she is feeling much better.  She has gotten out of bed several times to use the restroom but unable to provide stool sample.  She has eaten cup of broth and crackers as well as tolerated PO fluids.  VS have remained stable.  She again denies current abdominal pain or need for medications.  Clinically doubt diverticulitis given her rather benign abdominal exam-- Dr. Venora Maples has evaluated patient and agrees.  Will discharge home with zofran, encouraged to continue fluids, BRAT diet and progress back to normal as tolerated.  She will FU with her PCP.  Discussed plan with patient, he/she acknowledged understanding and agreed with plan of care.  Return precautions  given for new or worsening symptoms.  Case discussed with attending physician, Dr. Venora Maples, who  personally evaluated patient and agrees with assessment and plan of care.  Larene Pickett, PA-C 03/01/14 [place], MD 03/04/14 0400

## 2014-03-01 NOTE — ED Notes (Signed)
Pt reports flu like symptoms with diarrhea since Thursday.  Diarrhea is described as black and tarry and extremely malodorous.  Pt went to urgent care for symptoms and had a positive hemoccult.  Pt sent to ED for evaluation.

## 2014-03-01 NOTE — ED Provider Notes (Signed)
CSN: [id number]     Arrival date & time 03/01/14  1303 History   First MD Initiated Contact with Patient 03/01/14 1312     Chief Complaint  Patient presents with  . Fever  . Diarrhea  . Nasal Congestion   (Consider location/radiation/quality/duration/timing/severity/associated sxs/prior Treatment) HPI  She is a 80 year old woman here for evaluation of flulike symptoms and diarrhea. She states her symptoms started about 5 days ago with chills, fevers, body aches, nasal congestion, rhinorrhea, eye pain. She took a sinus pill, which improved her nasal congestion and rhinorrhea.  Then 2 days ago, she developed dark watery stools. She is having about 10 stools a day. She also has diffuse abdominal pain. She denies any nausea or vomiting. She reports decreased urination, dry mouth, dizziness on standing. She is trying to drink fluids. She states her temperature has ranged from 99-102. She states her last fever was last night.  Past Medical History  Diagnosis Date  . Supraventricular tachycardia, paroxysmal   . HTN (hypertension)   . Hyperlipidemia   . Postmenopausal HRT (hormone replacement therapy)   . Other seborrheic keratosis   . Vaginal pruritus   . Urinary frequency   . Diverticulosis of colon   . Blood in stool   . Pain in joint, lower leg   . Osteopenia   . IBS (irritable bowel syndrome)   . Hypothyroidism     TSH 14.488 (11/2005)  . H pylori ulcer     not treated due to expense  10/2001  . PPD positive     history +PPD 1984, no treatment, no abnormal CXR  . Posterior vitreous detachment 1996  . Multiple pigmented nevi     last derm evaluation 01/2003  . Postmenopausal     s/p hysterectomy for h/o cervical cancer 1982 (both ovaries taken at that time) now on hormonal replacement   . Shortness of breath     with ambulation  . Environmental allergies   . Lactose intolerance   . GERD (gastroesophageal reflux disease)   . Hand dermatitis   . Cervical cancer     s/p  hysterectomy/oop  . PONV (postoperative nausea and vomiting)     "and takes me a long time to come out under it" (02/08/2012)  . Pneumonia     "couple times in my lifetime" (02/08/2012)  . History of bronchitis     "w/a cold" (02/08/2012)  . Migraines     "get them very rarely now" (02/08/2012)  . OA (osteoarthritis)     multiple sites  . Arthritis     sed rate 10, RF, CCP pending  . DDD (degenerative disc disease), lumbosacral   . Osteoporosis     "borderline" (02/08/2012)  . Depression     Due to husbands passing away 09/22/2011   Past Surgical History  Procedure Laterality Date  . Eye surgery      cataract removal right eye  . Colonoscopy    . Total abdominal hysterectomy w/ bilateral salpingoophorectomy  1982  . Cataract extraction w/ intraocular lens implant  ?1997    right  . Total knee arthroplasty  02/08/2012    Procedure: TOTAL KNEE ARTHROPLASTY;  Surgeon: Hessie Dibble, MD;  Location: [place];  Service: Orthopedics;  Laterality: Left;  . Cardiovascular stress test  12/07/11    Normal nuclear stress test  . Abdominal hysterectomy  1982   Family History  Problem Relation Age of Onset  . Breast cancer Mother 34  . Lung cancer Brother  31  . Cancer Brother     lung  . Heart failure Father     congestive  . Heart disease Father   . Epilepsy Son   . Kidney disease Son     tuberous sclerosis, both kidneys removed, has transplant  . Diabetes Maternal Uncle   . Diabetes Paternal Aunt   . Cancer Maternal Grandmother     breast  . Heart disease Son     [place] white  . COPD Son    History  Substance Use Topics  . Smoking status: Former Smoker -- 1.00 packs/day for 25 years    Types: Cigarettes    Quit date: 04/17/1980  . Smokeless tobacco: Never Used     Comment: passive smoker, husband smoked  . Alcohol Use: No   OB History    No data available     Review of Systems  Constitutional: Positive for fever and chills.  HENT: Positive for congestion and  rhinorrhea.   Eyes: Positive for pain.  Respiratory: Negative for cough and shortness of breath.   Gastrointestinal: Positive for abdominal pain and diarrhea. Negative for nausea, vomiting and blood in stool.  Musculoskeletal: Positive for myalgias.  Neurological: Positive for dizziness.    Allergies  Benzonatate; Doxycycline; Sulfonamide derivatives; and Amlodipine  Home Medications   Prior to Admission medications   Medication Sig Start Date End Date Taking? Authorizing Provider  atenolol (TENORMIN) 25 MG tablet Take 12.5 mg by mouth 2 (two) times daily.   Yes Historical Provider, MD  levothyroxine (SYNTHROID, LEVOTHROID) 75 MCG tablet Take 1 tablet (75 mcg total) by mouth daily before breakfast. 09/22/13  Yes Irene Pap, NP  Cholecalciferol (VITAMIN D3) 5000 UNITS CAPS Take 1 capsule by mouth daily. Take with food containing fats. 12/18/12   Irene Pap, NP  Cyanocobalamin (VITAMIN B12 PO) Take 2 each by mouth daily. 5000units per dropperful    Historical Provider, MD  diclofenac sodium (VOLTAREN) 1 % GEL Apply 2 g topically 2 (two) times daily.    Historical Provider, MD  fexofenadine (ALLEGRA) 180 MG tablet Take 1 tablet (180 mg total) by mouth daily as needed. Allergies 11/28/12   Josalyn C Funches, MD  fluticasone (FLONASE) 50 MCG/ACT nasal spray Place 2 sprays into the nose as needed. Allergies    Historical Provider, MD  omeprazole (PRILOSEC) 10 MG capsule Take 1 capsule (10 mg total) by mouth daily. 04/01/13   Irene Pap, NP  simvastatin (ZOCOR) 10 MG tablet Take 1 tablet (10 mg total) by mouth every evening. 09/26/13   Irene Pap, NP   BP 134/78 mmHg  Pulse 87  Temp(Src) 98.1 F (36.7 C) (Oral)  Resp 15  SpO2 98% Physical Exam  Constitutional: She is oriented to person, place, and time. She appears well-developed and well-nourished. She appears distressed (appears unwell).  HENT:  Head: Normocephalic and atraumatic.  Mouth/Throat: Mucous membranes are dry. No  oropharyngeal exudate or posterior oropharyngeal erythema.  Neck: Neck supple.  Cardiovascular: Normal rate, regular rhythm and normal heart sounds.   No murmur heard. Pulmonary/Chest: Effort normal and breath sounds normal. No respiratory distress. She has no wheezes. She has no rales.  Abdominal: Soft. Bowel sounds are normal. She exhibits no distension. There is tenderness (diffuse). There is no rebound and no guarding.  Genitourinary: Rectal exam shows no fissure, no tenderness and anal tone normal. Guaiac positive stool.     Lymphadenopathy:    She has no cervical adenopathy.  Neurological: She is  alert and oriented to person, place, and time.    ED Course  Procedures (including critical care time) Labs Review Labs Reviewed  OCCULT BLOOD, POC DEVICE - Abnormal; Notable for the following:    Fecal Occult Bld POSITIVE (*)    All other components within normal limits  POCT I-STAT, CHEM 8 - Abnormal; Notable for the following:    Sodium 127 (*)    Potassium 3.5 (*)    Chloride 92 (*)    Glucose, Bld 114 (*)    Calcium, Ion 1.05 (*)    Hemoglobin 16.0 (*)    HCT 47.0 (*)    All other components within normal limits    Imaging Review No results found.   MDM   1. Diarrhea   2. Blood in stool   3. Fever and chills    She is a very pleasant lady who has been sick for the last 5 days with fever and URI symptoms. 2 days ago she developed abdominal pain and diarrhea. Fecal occult blood is positive. Orthostatic vitals are borderline positive. i-STAT shows a sodium of 127. Also with mild hypokalemia. Hemoglobin good at 16. She does have diverticula in her colon, and I am concerned about diverticulitis. Her abdominal exam is benign. IV started here with a 1 L normal saline bolus. We'll transfer to the emergency room for additional workup.    Melony Overly, MD 03/01/14 [phone]

## 2014-03-01 NOTE — ED Notes (Signed)
Pt. given pillow and blanket.  Son in to see pt. and told her he was going to run an errand and would find her in the ED.

## 2014-03-01 NOTE — ED Notes (Addendum)
C/o chills and fever Wed night, aching, and eyes hurt. Diarrhea onset Fri. D between 5-10, Sat. D x 10, today x 5.  States its black and watery but no blood noted.  No N or V. C/o stomach just hurts.  C/o nasal congestion that runs down her throat.  She took an allergy pill yesterday and it dried up.  Occasional cough.  C/o dizziness when she stands up.

## 2014-03-01 NOTE — ED Notes (Signed)
MD at bedside. 

## 2014-03-03 ENCOUNTER — Encounter: Payer: Self-pay | Admitting: Neurology

## 2014-03-04 ENCOUNTER — Ambulatory Visit (INDEPENDENT_AMBULATORY_CARE_PROVIDER_SITE_OTHER): Payer: Medicare HMO | Admitting: Nurse Practitioner

## 2014-03-04 ENCOUNTER — Encounter: Payer: Self-pay | Admitting: Nurse Practitioner

## 2014-03-04 VITALS — BP 148/74 | HR 58 | Temp 97.6°F | Resp 18 | Ht 62.0 in | Wt 147.0 lb

## 2014-03-04 DIAGNOSIS — R195 Other fecal abnormalities: Secondary | ICD-10-CM

## 2014-03-04 DIAGNOSIS — E871 Hypo-osmolality and hyponatremia: Secondary | ICD-10-CM

## 2014-03-04 DIAGNOSIS — R197 Diarrhea, unspecified: Secondary | ICD-10-CM

## 2014-03-04 LAB — CBC
HCT: 36.6 % (ref 36.0–46.0)
HEMOGLOBIN: 12.2 g/dL (ref 12.0–15.0)
MCHC: 33.3 g/dL (ref 30.0–36.0)
MCV: 84.4 fl (ref 78.0–100.0)
Platelets: 245 10*3/uL (ref 150.0–400.0)
RBC: 4.34 Mil/uL (ref 3.87–5.11)
RDW: 13.1 % (ref 11.5–15.5)
WBC: 10.5 10*3/uL (ref 4.0–10.5)

## 2014-03-04 LAB — BASIC METABOLIC PANEL
BUN: 9 mg/dL (ref 6–23)
CO2: 28 meq/L (ref 19–32)
Calcium: 8.3 mg/dL — ABNORMAL LOW (ref 8.4–10.5)
Chloride: 98 mEq/L (ref 96–112)
Creatinine, Ser: 0.7 mg/dL (ref 0.4–1.2)
GFR: 87.39 mL/min (ref >60.00)
GLUCOSE: 98 mg/dL (ref 70–99)
Potassium: 3.8 mEq/L (ref 3.5–5.1)
SODIUM: 134 meq/L — AB (ref 135–145)

## 2014-03-04 NOTE — Progress Notes (Signed)
Pre visit review using our clinic review tool, if applicable. No additional management support is needed unless otherwise documented below in the visit note. 

## 2014-03-04 NOTE — Patient Instructions (Signed)
Increase fluid intake. That includes all beverages & foods that are liquid at room temperature-ice cream, pudding, popsicles. Sip every hour.  Avoid plain water-mix with gatorade or pedialyte. Do this for several days until you are getting energy back.  My office will call with lab results.  I will see you next month or sooner if you need me.  Happy Thanksgiving!

## 2014-03-04 NOTE — Progress Notes (Signed)
Subjective:     Amy Rodriguez is a 80 y.o. female who presents for hospital follow up of diarrhea. She was evaluated in ER with 2 day Hx of diarrhea-10 stools/day; 4 days not feeling well w/fever, HA & body aches. Stool in ER was guaiac pos. Hgb 13.2, Na 132. She received 1 liter fluids-felt better & was d/c to home.  Today, she reports no diarrhea since SUnday (day of ER visit). Body aches, fever, HA have resolved, but feels "washed out" & decreased appetite.    The following portions of the patient's history were reviewed and updated as appropriate: allergies, current medications, past medical history, past social history, past surgical history and problem list.  Review of Systems Constitutional: positive for fatigue, negative for weight loss Ears, nose, mouth, throat, and face: positive for nasal congestion, negative for earaches and sore throat Respiratory: positive for cough Cardiovascular: negative for chest pressure/discomfort, irregular heart beat and lower extremity edema Gastrointestinal: positive for decreased appetite, negative for abdominal pain, diarrhea and nausea Genitourinary:negative for dysuria    Objective:    There were no vitals taken for this visit. General: alert, cooperative, appears stated age, fatigued and pale  Hydration:  mouth sounds dry when speaks, mucosa appears moist, SG urine is 1.010. Pt states she feels thirsty  Abdomen: Lungs: Heart: EENT:   Rectal: soft, non-tender; bowel sounds normal; no masses,  no organomegaly   Clear BS, no wheezes RRR, no murmur Lids & lashes clear, wearing glasses Moist nasal mucosa, clear d/c nml posterior pharynx, mucosa slightly moist Nml rectal tone, small external hemorrhoid, brown stool, guiac neg    Assessment:  1. Diarrhea Likely viral - POCT urinalysis dipstick-pos leuks & blood - POCT occult blood stool; Neg - Basic metabolic panel - CBC - Urine culture  2. Guaiac positive stools - POCT occult blood  stool-neg - CBC  3. Hyponatremia -Bmet No salt restriction Drink V-8 daily. No plain water-dilute w/gatorade or pedialyte  See patient instructions for complete plan. F/u next mo for reg scheduled appt.

## 2014-03-05 ENCOUNTER — Other Ambulatory Visit: Payer: Self-pay | Admitting: Nurse Practitioner

## 2014-03-05 ENCOUNTER — Telehealth: Payer: Self-pay | Admitting: Nurse Practitioner

## 2014-03-05 DIAGNOSIS — R05 Cough: Secondary | ICD-10-CM

## 2014-03-05 DIAGNOSIS — R509 Fever, unspecified: Secondary | ICD-10-CM

## 2014-03-05 DIAGNOSIS — R059 Cough, unspecified: Secondary | ICD-10-CM

## 2014-03-05 DIAGNOSIS — R195 Other fecal abnormalities: Secondary | ICD-10-CM | POA: Insufficient documentation

## 2014-03-05 NOTE — Telephone Encounter (Signed)
pls call pt: Advise Sodium is better, just barely low. No drop in hemoglobin. Continue with instructions given in office.

## 2014-03-05 NOTE — Telephone Encounter (Signed)
pls call pt: Advise I'd like for her to get CXR. Ask where she wants to Go & I will order.

## 2014-03-05 NOTE — Telephone Encounter (Signed)
Spoke with pt, advised lab results. She states she had a fever 101.9 last night and she feels like she is dragging around. Please advise.

## 2014-03-05 NOTE — Telephone Encounter (Signed)
Spoke with pt, she would like the order to go to [facility].

## 2014-03-06 LAB — URINE CULTURE

## 2014-03-06 MED ORDER — LEVOFLOXACIN 500 MG PO TABS: 500.0000 mg | ORAL_TABLET | Freq: Every day | ORAL | Status: DC

## 2014-03-06 NOTE — Telephone Encounter (Signed)
Spoke w/pt. She is able to get transportation tomorrow, she will go to sat clinic tomorrow for eval.

## 2014-03-06 NOTE — Telephone Encounter (Signed)
Spoke w/pt: Pt febrile past 2 nights-over 101 oral. She is c/o r-sided low back pain. She continues to feel fatigued & has occasional cough. Urine culture appears contaminated. Pt has been unable to get transportation to get CXR.  She thinks she will have transportation tomorrow. I recommend she go to [facility] for eval tomorrow. If cannot get there before close, she should go to [facility] for CXR. Pt does not want to pay for delivery fee for delivery pharm-was going to call in [facility].

## 2014-03-06 NOTE — Telephone Encounter (Signed)
Patient can't find a ride today but thinks she can find one tomorrow. Advised patient to go to ED check-in as outpatient & they will take her to radiology.

## 2014-03-07 ENCOUNTER — Ambulatory Visit (HOSPITAL_COMMUNITY): Admission: RE | Admit: 2014-03-07 | Payer: Medicare HMO | Source: Ambulatory Visit

## 2014-03-07 ENCOUNTER — Ambulatory Visit (HOSPITAL_COMMUNITY)
Admission: RE | Admit: 2014-03-07 | Discharge: 2014-03-07 | Disposition: A | Discharge status: Home / Self Care | Payer: Medicare HMO | Source: Ambulatory Visit | Attending: Nurse Practitioner | Admitting: Nurse Practitioner

## 2014-03-07 ENCOUNTER — Telehealth: Payer: Self-pay | Admitting: Nurse Practitioner

## 2014-03-07 DIAGNOSIS — R079 Chest pain, unspecified: Secondary | ICD-10-CM | POA: Diagnosis present

## 2014-03-07 DIAGNOSIS — R05 Cough: Secondary | ICD-10-CM | POA: Diagnosis not present

## 2014-03-07 DIAGNOSIS — J189 Pneumonia, unspecified organism: Secondary | ICD-10-CM

## 2014-03-07 DIAGNOSIS — J449 Chronic obstructive pulmonary disease, unspecified: Secondary | ICD-10-CM | POA: Insufficient documentation

## 2014-03-07 DIAGNOSIS — R509 Fever, unspecified: Secondary | ICD-10-CM | POA: Insufficient documentation

## 2014-03-07 DIAGNOSIS — R059 Cough, unspecified: Secondary | ICD-10-CM

## 2014-03-07 MED ORDER — LEVOFLOXACIN 500 MG PO TABS: 500.0000 mg | ORAL_TABLET | Freq: Every day | ORAL | Status: DC

## 2014-03-07 MED ORDER — GUAIFENESIN ER 600 MG PO TB12
600.0000 mg | ORAL_TABLET | Freq: Two times a day (BID) | ORAL | Status: DC
Start: 2014-03-07 — End: 2014-04-29

## 2014-03-07 NOTE — Telephone Encounter (Signed)
Treating for R pneumonia. 2nd occurrence in 6 mos. Will see pt in ofc in 10 days. She is to call if unable to eat for hospital admission. Pt will not have access to meds until tomorrow. Will ref to pulm due to COPD & interstitial lung on chest xray. Discussed w/pt. Answered all questions.

## 2014-03-09 ENCOUNTER — Telehealth: Payer: Self-pay | Admitting: Nurse Practitioner

## 2014-03-09 DIAGNOSIS — J189 Pneumonia, unspecified organism: Secondary | ICD-10-CM

## 2014-03-09 MED ORDER — DOXYCYCLINE HYCLATE 100 MG PO TABS: 100.0000 mg | ORAL_TABLET | Freq: Two times a day (BID) | ORAL | Status: DC

## 2014-03-09 NOTE — Telephone Encounter (Signed)
Patient is requesting Amy Rodriguez to call her back about the recent antibiotic Rx.

## 2014-03-10 ENCOUNTER — Other Ambulatory Visit: Payer: Self-pay | Admitting: Nurse Practitioner

## 2014-03-10 ENCOUNTER — Other Ambulatory Visit: Payer: Self-pay

## 2014-03-10 DIAGNOSIS — J189 Pneumonia, unspecified organism: Secondary | ICD-10-CM

## 2014-03-10 MED ORDER — AMOXICILLIN-POT CLAVULANATE 875-125 MG PO TABS: 1.0000 | ORAL_TABLET | Freq: Two times a day (BID) | ORAL | Status: DC

## 2014-03-10 NOTE — Progress Notes (Signed)
Pt sent back doxy to pharmacy due to made her nauseous last time she took med.  New script sent for augmentin.

## 2014-03-10 NOTE — Telephone Encounter (Signed)
Spoke with [employer] and they will be able to deliver the medications today to her home. I called pt to let her know. Pt understood.

## 2014-03-11 ENCOUNTER — Telehealth: Payer: Self-pay

## 2014-03-11 ENCOUNTER — Other Ambulatory Visit: Payer: Self-pay | Admitting: Nurse Practitioner

## 2014-03-11 DIAGNOSIS — J189 Pneumonia, unspecified organism: Secondary | ICD-10-CM

## 2014-03-11 MED ORDER — ALBUTEROL SULFATE HFA 108 (90 BASE) MCG/ACT IN AERS: 2.0000 | INHALATION_SPRAY | Freq: Four times a day (QID) | RESPIRATORY_TRACT | Status: DC | PRN

## 2014-03-11 NOTE — Telephone Encounter (Signed)
I called pt, she states they haven't arrived with her medication yet but I informed her about using the albuterol inhaler. She is still eating and drinking. Her son and daughter in law will be there tomorrow to spend the day with her. Pt didn't seem in any distress on the phone.

## 2014-03-16 ENCOUNTER — Ambulatory Visit (INDEPENDENT_AMBULATORY_CARE_PROVIDER_SITE_OTHER): Payer: Medicare HMO | Admitting: Nurse Practitioner

## 2014-03-16 ENCOUNTER — Encounter: Payer: Self-pay | Admitting: Nurse Practitioner

## 2014-03-16 VITALS — BP 151/81 | HR 97 | Temp 97.6°F | Resp 18 | Ht 62.0 in | Wt 141.0 lb

## 2014-03-16 DIAGNOSIS — J441 Chronic obstructive pulmonary disease with (acute) exacerbation: Secondary | ICD-10-CM

## 2014-03-16 DIAGNOSIS — J189 Pneumonia, unspecified organism: Secondary | ICD-10-CM

## 2014-03-16 MED ORDER — FLUTICASONE-SALMETEROL 230-21 MCG/ACT IN AERO: 2.0000 | INHALATION_SPRAY | Freq: Two times a day (BID) | RESPIRATORY_TRACT | Status: DC

## 2014-03-16 NOTE — Progress Notes (Signed)
Pre visit review using our clinic review tool, if applicable. No additional management support is needed unless otherwise documented below in the visit note. 

## 2014-03-16 NOTE — Patient Instructions (Signed)
Continue taking antibiotic until finished. Start probiotic-take daily for 30 days. Get Align, Ellis, or [employer].  Increase calories-Drink 1 or 2 cans of Ensure or Boost. Add fresh or frozen berries to increase calories & nutrients.  Weigh daily. If weight continues to decrease, please let me know.  Please see pulmonologist. Start inhaler, use daily.  Consider transferring care to [facility]. If you decide to use this service, they will become your primary care doctor, so I will not see you any longer, but you will benefit from the convenience of being seen at home. Let me know if you want to pursue this & I will refer you.   You will need a chest xray to follow up on pnemonia in about 5 weeks.  I will see you in 1 month-or sooner if you are not gaining weight, unless you have transferred care.

## 2014-03-16 NOTE — Progress Notes (Signed)
Subjective:    Amy Rodriguez is a 80 y.o. female who presents for follow up of RUL pneumonia. She is accompanied by her daughter-in-law today. Amy Rodriguez has completed 5 days augmentin. She states cough is less frequent, phlegm is clear, but she feels very tired & has decreased appetite. She has lost 6 pounds in 12 days. She is not wearing dentures today due to sore mouth which started after 2 doses levaquin-not wearing dentures is contributing to wt loss. (Levaquin was d/c'd & switched to augmentin.) She is not using albuterol inhaler due to SE of "makes my heart race". She used mucinex a few days, but has stopped med. Recent CXR showed RUL infiltrate superimposed over COPD & interstitial lung disease. Pt gives HX of 20 yrs smoker 1ppd. Quit 30 years ago. Also exposed to second hand smoke for about 30 yrs.  Today I discussed my concern over her delay in treatment of pneumonia because she could not get transportation to get chest xray or meds. I found pharmacy that will deliver meds to her home, but asked that she consider transferring her care to [facility] or consider different living arrangement-assisted living. Pt agrees that she needs a different arrangement. I gave her info for [facility] & for medicalert services.   The following portions of the patient's history were reviewed and updated as appropriate: allergies, current medications, past medical history, past social history, past surgical history and problem list.  Review of Systems Constitutional: positive for fatigue and weight loss, negative for fevers and night sweats Respiratory: positive for cough, sputum and improving, negative for pleurisy/chest pain and wheezing Cardiovascular: positive for rapid heart rate when using albuterol Gastrointestinal: positive for loose stool yesterday times 1, negative for diarrhea, nausea and vomiting Neurological: negative for headaches Behavioral/Psych: negative for sleep  disturbance and remote Hx smoker   Objective:     BP 151/81 mmHg  Pulse 97  Temp(Src) 97.6 F (36.4 C) (Oral)  Resp 18  Ht 5\' 2"  (1.575 m)  Wt 141 lb (63.957 kg)  BMI 25.78 kg/m2  SpO2 97% General appearance: alert, cooperative, appears stated age, no distress and pale Head: Normocephalic, without obvious abnormality, atraumatic Eyes: negative findings: lids and lashes normal and conjunctivae and sclerae normal Ears: normal TM's and external ear canals both ears Throat: lips, mucosa, and tongue normal; teeth and gums normal and not wearing dentures, no teeth Back: moderate kyphosis Lungs: clear to auscultation bilaterally Heart: regular rate and rhythm, S1, S2 normal, no murmur, click, rub or gallop Lymph nodes: no cervical or [place] LAD   Assessment: Plan     1. Frequent episodes of pneumonia Pneumonia in May 2015 following hospitalization for TIA Present episode followed flu-like illness w/mostly Gi symptoms. Recent CXR shows COPD & interstitial lung disease - Ambulatory referral to Pulmonology  2. COPD exacerbation - fluticasone-salmeterol (ADVAIR HFA) 230-21 MCG/ACT inhaler; Inhale 2 puffs into the lungs 2 (two) times daily.  Dispense: 1 Inhaler; Refill: 6  See patient instructions for complete plan. F/u 1 mo. Pt to call if she wants to transfer services to [facility]

## 2014-03-18 ENCOUNTER — Institutional Professional Consult (permissible substitution): Payer: Medicare HMO | Admitting: Pulmonary Disease

## 2014-03-20 ENCOUNTER — Ambulatory Visit: Payer: Commercial Managed Care - HMO | Admitting: Nurse Practitioner

## 2014-03-20 ENCOUNTER — Other Ambulatory Visit: Payer: Self-pay | Admitting: *Deleted

## 2014-03-20 MED ORDER — ATENOLOL 25 MG PO TABS: 12.5000 mg | ORAL_TABLET | Freq: Two times a day (BID) | ORAL | Status: DC

## 2014-03-30 ENCOUNTER — Telehealth: Payer: Self-pay

## 2014-03-30 NOTE — Telephone Encounter (Signed)
Sheryle Spray called to verify that Amy Rodriguez is a patient of ours. She states she can help with pt if she ever needs a referral or or services that she may need. This is an added benefit that she has with her insurance. FYI

## 2014-04-01 ENCOUNTER — Ambulatory Visit (INDEPENDENT_AMBULATORY_CARE_PROVIDER_SITE_OTHER): Payer: Commercial Managed Care - HMO | Admitting: Pulmonary Disease

## 2014-04-01 ENCOUNTER — Encounter: Payer: Self-pay | Admitting: Pulmonary Disease

## 2014-04-01 ENCOUNTER — Encounter (INDEPENDENT_AMBULATORY_CARE_PROVIDER_SITE_OTHER): Payer: Self-pay

## 2014-04-01 VITALS — BP 140/76 | HR 58 | Temp 98.0°F | Ht 62.0 in | Wt 146.6 lb

## 2014-04-01 DIAGNOSIS — R9389 Abnormal findings on diagnostic imaging of other specified body structures: Secondary | ICD-10-CM | POA: Insufficient documentation

## 2014-04-01 DIAGNOSIS — R938 Abnormal findings on diagnostic imaging of other specified body structures: Secondary | ICD-10-CM

## 2014-04-01 NOTE — Progress Notes (Signed)
   Subjective:    [name], female    DOB: 1934/01/11, 80 y.o.   MRN: [id number]  HPI The patient is a 80 year old female who I've been asked to see for an abnormal chest x-ray. She has been noted to have increasing episodes of pulmonary infection, occurring this year and both April and most recently in November. Last year she did not have any significant issues. In April of this year she developed purulent mucus, chest congestion, and felt poorly. She was told that she had "pneumonia", but is unclear if she had a chest x-ray done. She was treated with a course of antibiotics and returned to baseline. She became ill again in November, but this time she did not have chest congestion or purulence. She did have increasing cough. She had a chest x-ray that showed significantly increased bronchovascular markings, and also the question of a right upper lobe infiltrate. She was treated with antibiotics and did have in her cough. She feels that she is back to her usual baseline except her energy level. The patient does have a history of smoking for 25 years, but has not done so since 1982. She has had pulmonary function studies within the last 5-6 years, but these are not available currently. She describes a chronic dyspnea on exertion with moderate activities, and has some cough at baseline that she feels is secondary to postnasal drip. She has no history of asthma. She is currently not using albuterol because it bothers her heart rate, and she has not started on Advair that was prescribed.   Review of Systems  Constitutional: Negative for fever and unexpected weight change.  HENT: Positive for congestion, dental problem, postnasal drip and sneezing. Negative for ear pain, nosebleeds, rhinorrhea, sinus pressure, sore throat and trouble swallowing.   Eyes: Negative for redness and itching.  Respiratory: Positive for cough and shortness of breath. Negative for chest tightness and wheezing.     Cardiovascular: Negative for palpitations and leg swelling.  Gastrointestinal: Negative for nausea and vomiting.  Genitourinary: Negative for dysuria.  Musculoskeletal: Positive for arthralgias. Negative for joint swelling.  Skin: Negative for rash.  Neurological: Negative for headaches.  Hematological: Does not bruise/bleed easily.  Psychiatric/Behavioral: Negative for dysphoric mood. The patient is not nervous/anxious.        Objective:   Physical Exam Constitutional:  Well developed, no acute distress  HENT:  Nares patent without discharge  Oropharynx without exudate, palate and uvula are normal  Eyes:  Perrla, eomi, no scleral icterus  Neck:  No JVD, no TMG  Cardiovascular:  Normal rate, slightly irregular rhythm, no rubs or gallops.  No murmurs        Intact distal pulses  Pulmonary :  Decreased airflow, no stridor or respiratory distress   No rales, rhonchi, or wheezing  Abdominal:  Soft, nondistended, bowel sounds present.  No tenderness noted.   Musculoskeletal:  mild lower extremity edema noted, +varicosities  Lymph Nodes:  No cervical lymphadenopathy noted  Skin:  No cyanosis noted  Neurologic:  Alert, appropriate, moves all 4 extremities without obvious deficit.         Assessment & Plan:

## 2014-04-01 NOTE — Assessment & Plan Note (Signed)
The patient has an abnormal chest x-ray that is suggestive of underlying bronchiectasis. This would certainly fit with her increase in pulmonary infections, and can possibly lead to underlying obstructive lung disease that can result in dyspnea on exertion. She also has a history of smoking for 25 years, but has not smoked since 1982. She may have some underlying emphysema as well but is contributing to her chest x-ray abnormality. At this point, I would like to check a high-resolution CT of chest to evaluate for bronchiectasis, and will also schedule for pulmonary function studies. I will see her back after the studies are done, and reviewed with her. For now, I would like for her to stay off Advair, but continues albuterol if needed for rescue.

## 2014-04-01 NOTE — Patient Instructions (Signed)
Will schedule for breathing studies, as well as a scan of your chest.  Will arrange for a followup visit once the results are available.  Do not start advair for now.

## 2014-04-06 ENCOUNTER — Encounter (HOSPITAL_COMMUNITY): Payer: Medicare HMO

## 2014-04-06 ENCOUNTER — Inpatient Hospital Stay: Admission: RE | Admit: 2014-04-06 | Payer: Medicare HMO | Source: Ambulatory Visit

## 2014-04-07 ENCOUNTER — Telehealth: Payer: Self-pay

## 2014-04-07 NOTE — Telephone Encounter (Signed)
Pt needs refill on simvastatin and Levothyroxine sent to [facility]. Last OV 03/16/2014.

## 2014-04-08 NOTE — Telephone Encounter (Signed)
pls call pt:  At last OV, I suggested she consider a healthcare service that will come to her house, given she is homebound. Pls Verify that I am still her provider. If so, I will fill levothyroxine & simvastatin. Pls verify [facility] than [facility] for delivery. SHe needs to schedule OV by March.

## 2014-04-13 ENCOUNTER — Other Ambulatory Visit: Payer: Self-pay | Admitting: Nurse Practitioner

## 2014-04-13 DIAGNOSIS — E039 Hypothyroidism, unspecified: Secondary | ICD-10-CM

## 2014-04-13 DIAGNOSIS — E785 Hyperlipidemia, unspecified: Secondary | ICD-10-CM

## 2014-04-13 MED ORDER — LEVOTHYROXINE SODIUM 75 MCG PO TABS: 75.0000 ug | ORAL_TABLET | Freq: Every day | ORAL | Status: DC

## 2014-04-13 MED ORDER — SIMVASTATIN 10 MG PO TABS: 10.0000 mg | ORAL_TABLET | Freq: Every evening | ORAL | Status: DC

## 2014-04-13 NOTE — Telephone Encounter (Signed)
Pt called. She would like RX called into [facility] so she can have them deliver. She is almost out of medication. She has scheduled an appt for March as well.

## 2014-04-13 NOTE — Progress Notes (Signed)
Patient notified

## 2014-04-14 ENCOUNTER — Ambulatory Visit: Admission: RE | Admit: 2014-04-14 | Payer: Medicare HMO | Source: Ambulatory Visit

## 2014-04-14 ENCOUNTER — Ambulatory Visit (HOSPITAL_COMMUNITY)
Admission: RE | Admit: 2014-04-14 | Discharge: 2014-04-14 | Disposition: A | Discharge status: Home / Self Care | Payer: Commercial Managed Care - HMO | Source: Ambulatory Visit | Attending: Pulmonary Disease | Admitting: Pulmonary Disease

## 2014-04-14 DIAGNOSIS — R9389 Abnormal findings on diagnostic imaging of other specified body structures: Secondary | ICD-10-CM

## 2014-04-14 DIAGNOSIS — R938 Abnormal findings on diagnostic imaging of other specified body structures: Secondary | ICD-10-CM | POA: Diagnosis present

## 2014-04-14 LAB — PULMONARY FUNCTION TEST
DL/VA % pred: 51 %
DL/VA: 2.25 ml/min/mmHg/L
DLCO unc % pred: 44 %
DLCO unc: 9.06 ml/min/mmHg
FEF 25-75 Post: 0.78 L/sec
FEF 25-75 Pre: 1.55 L/sec
FEF2575-%Change-Post: -49 %
FEF2575-%PRED-POST: 59 %
FEF2575-%Pred-Pre: 117 %
FEV1-%CHANGE-POST: -20 %
FEV1-%Pred-Post: 94 %
FEV1-%Pred-Pre: 118 %
FEV1-POST: 1.63 L
FEV1-Pre: 2.04 L
FEV1FVC-%Change-Post: -14 %
FEV1FVC-%Pred-Pre: 101 %
FEV6-%Change-Post: -11 %
FEV6-%PRED-PRE: 124 %
FEV6-%Pred-Post: 109 %
FEV6-PRE: 2.72 L
FEV6-Post: 2.4 L
FEV6FVC-%Pred-Post: 106 %
FEV6FVC-%Pred-Pre: 106 %
FVC-%Change-Post: -6 %
FVC-%Pred-Post: 109 %
FVC-%Pred-Pre: 117 %
FVC-PRE: 2.72 L
FVC-Post: 2.54 L
POST FEV6/FVC RATIO: 100 %
PRE FEV1/FVC RATIO: 75 %
PRE FEV6/FVC RATIO: 100 %
Post FEV1/FVC ratio: 64 %
RV % pred: 96 %
RV: 2.14 L
TLC % pred: 106 %
TLC: 4.9 L

## 2014-04-14 MED ORDER — ALBUTEROL SULFATE (2.5 MG/3ML) 0.083% IN NEBU
2.5000 mg | INHALATION_SOLUTION | Freq: Once | RESPIRATORY_TRACT | Status: AC
  Administered 2014-04-14: 2.5 mg via RESPIRATORY_TRACT

## 2014-04-22 ENCOUNTER — Ambulatory Visit (INDEPENDENT_AMBULATORY_CARE_PROVIDER_SITE_OTHER)
Admission: RE | Admit: 2014-04-22 | Discharge: 2014-04-22 | Disposition: A | Discharge status: Home / Self Care | Payer: Commercial Managed Care - HMO | Source: Ambulatory Visit | Attending: Pulmonary Disease | Admitting: Pulmonary Disease

## 2014-04-22 DIAGNOSIS — R938 Abnormal findings on diagnostic imaging of other specified body structures: Secondary | ICD-10-CM

## 2014-04-22 DIAGNOSIS — R9389 Abnormal findings on diagnostic imaging of other specified body structures: Secondary | ICD-10-CM

## 2014-04-29 ENCOUNTER — Ambulatory Visit (INDEPENDENT_AMBULATORY_CARE_PROVIDER_SITE_OTHER): Payer: Commercial Managed Care - HMO | Admitting: Pulmonary Disease

## 2014-04-29 ENCOUNTER — Encounter: Payer: Self-pay | Admitting: Pulmonary Disease

## 2014-04-29 VITALS — BP 112/60 | HR 56 | Temp 98.7°F | Ht 61.0 in | Wt 148.8 lb

## 2014-04-29 DIAGNOSIS — J449 Chronic obstructive pulmonary disease, unspecified: Secondary | ICD-10-CM | POA: Insufficient documentation

## 2014-04-29 DIAGNOSIS — J438 Other emphysema: Secondary | ICD-10-CM

## 2014-04-29 DIAGNOSIS — J479 Bronchiectasis, uncomplicated: Secondary | ICD-10-CM

## 2014-04-29 NOTE — Progress Notes (Signed)
   Subjective:    [name], female    DOB: [birth year], 81 y.o.   MRN: [id number]  HPI Patient comes in today for follow-up after her recent CT chest and PFTs. She was found by high-resolution CT to have mild bronchiectasis, especially in the right middle lobe anteriorly. She also had significant emphysematous changes, but surprisingly her PFTs were totally normal except for a severe decrease in her diffusion capacity. The radiologist felt that her pulmonary arteries were large, but a recent echo showed no evidence for pulmonary hypertension.    Review of Systems  Constitutional: Negative for fever and unexpected weight change.  HENT: Negative for congestion, dental problem, ear pain, nosebleeds, postnasal drip, rhinorrhea, sinus pressure, sneezing, sore throat and trouble swallowing.   Eyes: Negative for redness and itching.  Respiratory: Positive for cough and shortness of breath. Negative for chest tightness and wheezing.   Cardiovascular: Negative for palpitations and leg swelling.  Gastrointestinal: Negative for nausea and vomiting.  Genitourinary: Negative for dysuria.  Musculoskeletal: Negative for joint swelling.  Skin: Negative for rash.  Neurological: Negative for headaches.  Hematological: Does not bruise/bleed easily.  Psychiatric/Behavioral: Negative for dysphoric mood. The patient is not nervous/anxious.        Objective:   Physical Exam Overweight female in no acute distress Nose without purulence or discharge noted Neck without lymphadenopathy or thyromegaly Chest with a few scattered crackles, no wheezing Cardiac exam with regular rate and rhythm Lower extremities with minimal edema, no cyanosis Alert and oriented, moves all 4 extremities.       Assessment & Plan:

## 2014-04-29 NOTE — Assessment & Plan Note (Signed)
The patient has mild bronchiectasis on her high-resolution CT scan, especially within the right middle lobe anteriorly. I suspect this is why she is having 1-2 episodes of pulmonary infections a year, but would not do anything different as long as it is not becoming more frequent. Because of her underlying emphysema, it would not surprise me if she has some airway inflammation associated with her bronchiectasis flares, and might benefit from a round of prednisone with her antibiotic. I would like to see her back on a yearly basis, but would be happy to see her sooner if she is having ongoing issues.

## 2014-04-29 NOTE — Patient Instructions (Signed)
You have bronchiectasis on your chest scan, which predisposes you to pulmonary infections.  If you are having more than 1-2 a year, we may need to do something difference You have emphysema on your chest scan, but you do not have abnormal airflow on your breathing studies.  I don't think you need a daily inhaler, but you may needs something more when you have your flareups.   Try zyrtec 10mg  one a day at bedtime to see if helps postnasal drip and cough followup with me again in one year, but call if having issues.

## 2014-04-29 NOTE — Assessment & Plan Note (Signed)
The patient has significant emphysematous changes on CT chest, but surprisingly she has no airflow obstruction on pulmonary function studies. Therefore, I would not recommend that she stay on a maintenance bronchodilator regimen, especially with her heart rate issue at times. I have told the patient that she will probably have a level of chronic dyspnea on exertion, and that she can help herself with modest weight loss and some type of conditioning program.

## 2014-05-22 ENCOUNTER — Encounter: Payer: Self-pay | Admitting: Nurse Practitioner

## 2014-05-22 ENCOUNTER — Telehealth: Payer: Self-pay | Admitting: Nurse Practitioner

## 2014-05-22 ENCOUNTER — Ambulatory Visit (INDEPENDENT_AMBULATORY_CARE_PROVIDER_SITE_OTHER): Payer: Commercial Managed Care - HMO | Admitting: Nurse Practitioner

## 2014-05-22 VITALS — BP 122/90 | HR 63 | Temp 98.2°F | Ht 62.0 in | Wt 147.0 lb

## 2014-05-22 DIAGNOSIS — J069 Acute upper respiratory infection, unspecified: Secondary | ICD-10-CM | POA: Diagnosis not present

## 2014-05-22 MED ORDER — PREDNISONE 10 MG PO TABS: ORAL_TABLET | ORAL | Status: DC

## 2014-05-22 NOTE — Progress Notes (Signed)
Pre visit review using our clinic review tool, if applicable. No additional management support is needed unless otherwise documented below in the visit note. 

## 2014-05-22 NOTE — Patient Instructions (Signed)
Start prednisone today. Take next doses in morning. Use benzocaine throat spray or lozenges for throat discomfort.  Use sinus rinse for nasal congestion.  Use tylenol for headache.  See me in week or sooner if you start running fever.

## 2014-05-22 NOTE — Progress Notes (Signed)
   Subjective:    [name], female    DOB: [birth year], 81 y.o.   MRN: [id number]  HPI Comments: Reviewed Dr Gwenette Greet note dayed Jan, 2016. He recommends prednisone with ABX when she has cough/resp illness due to bronchiectasis.  Sore Throat  This is a new problem. The current episode started yesterday. The problem has been unchanged. Neither side of throat is experiencing more pain than the other. There has been no fever. The pain is moderate. Associated symptoms include congestion, coughing (when lying down) and ear pain. Pertinent negatives include no abdominal pain, headaches, plugged ear sensation, shortness of breath or vomiting. She has tried nothing for the symptoms. The treatment provided no relief.      Review of Systems  Constitutional: Positive for chills and fatigue. Negative for fever.  HENT: Positive for congestion, ear pain and postnasal drip.   Respiratory: Positive for cough (when lying down). Negative for chest tightness, shortness of breath and wheezing.   Cardiovascular: Negative for chest pain.  Gastrointestinal: Negative for vomiting and abdominal pain.  Neurological: Negative for headaches.       Objective:   Physical Exam  Constitutional: She is oriented to person, place, and time. She appears well-developed and well-nourished.  HENT:  Head: Normocephalic and atraumatic.  Right Ear: External ear normal.  Left Ear: External ear normal.  Mouth/Throat: No oropharyngeal exudate.  Clear nasal discharge-wiping & blowing nose frequently. Uvula slightly swollen. Posterior pharynx erythematous TM vessels injected bilat.  Eyes: Conjunctivae are normal. Right eye exhibits no discharge. Left eye exhibits no discharge.  Neck: Normal range of motion. Neck supple. No thyromegaly present.  Cardiovascular: Normal rate, regular rhythm and normal heart sounds.   No murmur heard. Pulmonary/Chest: Effort normal and breath sounds normal. No respiratory distress.  She has no wheezes. She has no rales.  Lymphadenopathy:    She has no cervical adenopathy.  Neurological: She is alert and oriented to person, place, and time.  Skin: Skin is warm and dry.  Psychiatric: She has a normal mood and affect. Her behavior is normal. Thought content normal.  Vitals reviewed.         Assessment & Plan:  1. Acute upper respiratory infection Cough seems to be r/t post-nasal drip. BS clear Will start short course prednisone & inhaler use bid if cough gets worse. Sinus rinse daily, Benzocaine throat lozenge/spray Hold off on ABX for now. - predniSONE (DELTASONE) 10 MG tablet; Take 2Tpo qam X 3d, then 1T po qam X 3d, then 1/2T po qd X 5d  Dispense: 12 tablet; Refill: 0 F/u 5-6 days or sooner if febrile.

## 2014-05-22 NOTE — Telephone Encounter (Signed)
[place] [facility] - Client [facility] Patient Name: Amy Rodriguez DOB: 05-21-1933 Initial Comment Caller states c/o sore throat, facial pain, runny nose, earache Nurse Assessment Nurse: Markus Daft, RN, Windy Date/Time (Eastern Time): 05/22/2014 9:35:16 AM Confirm and document reason for call. If symptomatic, describe symptoms. ---Caller states c/o sore throat (rates pain 9/10), facial sinus pain across bridge of nose and under her eyes with constant pain. She also has a mild cough, runny nose, earache off/on. S/S started yesterday AM. No fever.Does the patient require triage? ---Yes Related visit to physician within the last 2 weeks? ---No Does the PT have any chronic conditions? (i.e. diabetes, asthma, etc.) ---Yes List chronic conditions. ---HTN Guidelines Guideline Title Affirmed Question Affirmed Notes Sore Throat [1] Difficulty breathing AND [2] not severe Final Disposition User Go to ED Now (or PCP triage) Markus Daft, [place], Sherre Poot. Appt scheduled within the hour.

## 2014-05-25 ENCOUNTER — Telehealth: Payer: Self-pay | Admitting: Nurse Practitioner

## 2014-05-25 DIAGNOSIS — J069 Acute upper respiratory infection, unspecified: Secondary | ICD-10-CM

## 2014-05-25 MED ORDER — AZITHROMYCIN 250 MG PO TABS: ORAL_TABLET | ORAL | Status: DC

## 2014-05-25 NOTE — Telephone Encounter (Signed)
Patient Name: BIBIANA GILLEAN DOB: 1933-10-17 Initial Comment caller states she was in the drs ofc on Friday - dx with URI - Today it has gone down into lungs and she is coughing up yellow mucus Nurse Assessment Nurse: Marcelline Deist, RN, Kermit Balo Date/Time (Eastern Time): 05/25/2014 10:20:15 AM Confirm and document reason for call. If symptomatic, describe symptoms. ---Caller states she was in the Dr's office on Friday, dx with URI. Was given Prednisone & told to use an inhaler. Today it has gone down into lungs and she is coughing up thick, opaque yellow mucus. Not running a fever. Has the patient traveled out of the country within the last 30 days? ---Not Applicable Does the patient require triage? ---Yes Related visit to physician within the last 2 weeks? ---Yes Does the PT have any chronic conditions? (i.e. diabetes, asthma, etc.) ---Yes List chronic conditions. ---emphysema Guidelines Guideline Title Affirmed Question Affirmed Notes Cough - Acute Productive [1] Known COPD or other severe lung disease (i.e., bronchiectasis, cystic fibrosis, lung surgery) AND [2] worsening symptoms (i.e., increased sputum purulence or amount, increased breathing difficulty Final Disposition User See Physician within 24 Hours [place], Therapist, sports, [employer] uses [facility] [phone]. Allergic to sulfa drugs, Doxycycline, Benzonatate, Amolodipine (?). Her secondary # is best after 2 pm today. Caller is not able to get in to office today or tomorrow, but said that Dr. Kathlen Mody told her to call back if she was worse & they might decide to put her on antibiotics.

## 2014-05-25 NOTE — Telephone Encounter (Signed)
Patient notified

## 2014-05-25 NOTE — Telephone Encounter (Signed)
Patient is not able to get in to office today or tomorrow, but said that Nicky Pugh told her to call back if she was worse & they might decide to put her on antibiotics. Please advise?

## 2014-05-25 NOTE — Telephone Encounter (Signed)
Start azithromycin Continue prednisone Start using advair twice daily.

## 2014-05-28 ENCOUNTER — Telehealth: Payer: Self-pay | Admitting: Nurse Practitioner

## 2014-05-28 NOTE — Telephone Encounter (Signed)
Showed when she was couching up mucous this morning, a little streak. Has not seen anymore while coughing. Patient stated that she keeping an eye on her cough productions.

## 2014-05-28 NOTE — Telephone Encounter (Signed)
Patient aware.

## 2014-05-28 NOTE — Telephone Encounter (Signed)
Is she coughing blood or blowing out of nose? If nose, tell her to use sinus rinse for several days or at least a saline spray. If she is sure blood is coming from chest & not post-nasal drip, she needs to be evaluated.

## 2014-05-28 NOTE — Telephone Encounter (Signed)
Pt. Called and has a bad cough and has had some blood. She has no transportation to get here. Please call her at [phone].

## 2014-05-28 NOTE — Telephone Encounter (Signed)
Returned pt's call. Patient just wanted to let you know she coughed and sneezed this morning and saw a little bit of blood. Advised pt this is not unusual with her symptoms. Continue with prescribed meds and continue to watch cough and nasal production. Also, patient is unable to come in for f/u today.

## 2014-05-28 NOTE — Telephone Encounter (Signed)
Reassure her that she should not be concerned about streak of mucous. If it persists, she should let us know,

## 2014-06-05 ENCOUNTER — Encounter: Payer: Self-pay | Admitting: Nurse Practitioner

## 2014-06-05 ENCOUNTER — Ambulatory Visit (INDEPENDENT_AMBULATORY_CARE_PROVIDER_SITE_OTHER): Payer: Commercial Managed Care - HMO | Admitting: Nurse Practitioner

## 2014-06-05 VITALS — BP 112/84 | HR 53 | Temp 97.6°F | Ht 62.0 in | Wt 151.0 lb

## 2014-06-05 DIAGNOSIS — J069 Acute upper respiratory infection, unspecified: Secondary | ICD-10-CM | POA: Diagnosis not present

## 2014-06-05 MED ORDER — DOXYCYCLINE HYCLATE 100 MG PO TABS: 100.0000 mg | ORAL_TABLET | Freq: Two times a day (BID) | ORAL | Status: DC

## 2014-06-05 NOTE — Progress Notes (Signed)
Subjective:     Amy Rodriguez is a 81 y.o. female who presents for evaluation of fatigue, nasal congestion and persistent productive cough. Symptoms began 15 days ago. Symptoms have been gradually improving since that time-energy is a bit better, but cough & nasal congestion are persistent. She denies fever, chest pain, headache, sore throat. Past history is significant for COPD and pneumonia. She completed course of prednisone & azithromycin over 1 week ago. She is not using guiafenesen or advair as instructed. Doesn't feel she is using inhaler correctly. Discussed proper use. Doesn't use sinus rinse because she was told she has deviated septum. Advised this is not contraindication-OK if saline runs out mouth, will help clear nasal passages & add moisture.  The following portions of the patient's history were reviewed and updated as appropriate: allergies, current medications, past medical history, past social history, past surgical history and problem list.  Review of Systems Ears, nose, mouth, throat, and face: negative for earaches Gastrointestinal: negative for abdominal pain, diarrhea and nausea    Objective:    BP 112/84 mmHg  Pulse 53  Temp(Src) 97.6 F (36.4 C) (Oral)  Ht 5\' 2"  (1.575 m)  Wt 151 lb (68.493 kg)  BMI 27.61 kg/m2  SpO2 97% General appearance: alert, cooperative, appears stated age and no distress Head: Normocephalic, without obvious abnormality, atraumatic Eyes: negative findings: lids and lashes normal, conjunctivae and sclerae normal and wearing glasses Ears: normal TM's and external ear canals both ears Throat: lips, mucosa, and tongue normal; teeth and gums normal Lungs: clear to auscultation bilaterally and wet sounding cough-more productive after deep breathing in ofc. Heart: regular rate and rhythm, S1, S2 normal, no murmur, click, rub or gallop Lymph nodes: L anterior cervical LAD-single node, moveable, NT    Assessment:Plan  1. Upper respiratory  infection with cough and congestion Underlying COPD - doxycycline (VIBRA-TABS) 100 MG tablet; Take 1 tablet (100 mg total) by mouth 2 (two) times daily.  Dispense: 14 tablet; Refill: 0 Use advair twice daily until congestion clears-instructed on proper use Daily sinus rinse F/u in March for chronic conditions, guiac pos stool.

## 2014-06-05 NOTE — Patient Instructions (Signed)
Start antibiotic. Eat yogurt daily to help prevent antibiotic -associated diarrhea Eat meal in morning, then take antibiotic. Have snack in evening, then take. Take about every 12 hours.  Use advair twice daily until congestion clears. Rinse mouth after using.  I will see you in March unless you need me sooner.

## 2014-07-01 ENCOUNTER — Ambulatory Visit: Payer: Medicare HMO | Admitting: Nurse Practitioner

## 2014-07-08 ENCOUNTER — Ambulatory Visit (INDEPENDENT_AMBULATORY_CARE_PROVIDER_SITE_OTHER): Payer: Commercial Managed Care - HMO | Admitting: Nurse Practitioner

## 2014-07-08 ENCOUNTER — Encounter: Payer: Self-pay | Admitting: Nurse Practitioner

## 2014-07-08 VITALS — BP 120/70 | HR 50 | Temp 97.8°F | Ht 62.0 in | Wt 151.0 lb

## 2014-07-08 DIAGNOSIS — R7309 Other abnormal glucose: Secondary | ICD-10-CM | POA: Diagnosis not present

## 2014-07-08 DIAGNOSIS — E049 Nontoxic goiter, unspecified: Secondary | ICD-10-CM | POA: Diagnosis not present

## 2014-07-08 DIAGNOSIS — E538 Deficiency of other specified B group vitamins: Secondary | ICD-10-CM | POA: Insufficient documentation

## 2014-07-08 DIAGNOSIS — M545 Low back pain, unspecified: Secondary | ICD-10-CM

## 2014-07-08 DIAGNOSIS — Z23 Encounter for immunization: Secondary | ICD-10-CM

## 2014-07-08 DIAGNOSIS — Z0000 Encounter for general adult medical examination without abnormal findings: Secondary | ICD-10-CM

## 2014-07-08 DIAGNOSIS — E785 Hyperlipidemia, unspecified: Secondary | ICD-10-CM | POA: Diagnosis not present

## 2014-07-08 DIAGNOSIS — E559 Vitamin D deficiency, unspecified: Secondary | ICD-10-CM

## 2014-07-08 DIAGNOSIS — R7303 Prediabetes: Secondary | ICD-10-CM | POA: Insufficient documentation

## 2014-07-08 DIAGNOSIS — R195 Other fecal abnormalities: Secondary | ICD-10-CM

## 2014-07-08 LAB — COMPREHENSIVE METABOLIC PANEL
ALBUMIN: 4.2 g/dL (ref 3.5–5.2)
ALT: 10 U/L (ref 0–35)
AST: 17 U/L (ref 0–37)
Alkaline Phosphatase: 71 U/L (ref 39–117)
BILIRUBIN TOTAL: 0.4 mg/dL (ref 0.2–1.2)
BUN: 9 mg/dL (ref 6–23)
CHLORIDE: 103 meq/L (ref 96–112)
CO2: 27 mEq/L (ref 19–32)
Calcium: 8.8 mg/dL (ref 8.4–10.5)
Creatinine, Ser: 0.79 mg/dL (ref 0.40–1.20)
GFR: 74.69 mL/min (ref >60.00)
Glucose, Bld: 93 mg/dL (ref 70–99)
Potassium: 4.4 mEq/L (ref 3.5–5.1)
Sodium: 134 mEq/L — ABNORMAL LOW (ref 135–145)
TOTAL PROTEIN: 6.8 g/dL (ref 6.0–8.3)

## 2014-07-08 LAB — CBC WITH DIFFERENTIAL/PLATELET
Basophils Absolute: 0 10*3/uL (ref 0.0–0.1)
Basophils Relative: 0.5 % (ref 0.0–3.0)
EOS ABS: 0.4 10*3/uL (ref 0.0–0.7)
Eosinophils Relative: 6.3 % — ABNORMAL HIGH (ref 0.0–5.0)
HCT: 39 % (ref 36.0–46.0)
Hemoglobin: 13.3 g/dL (ref 12.0–15.0)
LYMPHS PCT: 28.7 % (ref 12.0–46.0)
Lymphs Abs: 1.7 10*3/uL (ref 0.7–4.0)
MCHC: 34.1 g/dL (ref 30.0–36.0)
MCV: 83.1 fl (ref 78.0–100.0)
MONO ABS: 0.7 10*3/uL (ref 0.1–1.0)
Monocytes Relative: 11.4 % (ref 3.0–12.0)
NEUTROS PCT: 53.1 % (ref 43.0–77.0)
Neutro Abs: 3.1 10*3/uL (ref 1.4–7.7)
PLATELETS: 201 10*3/uL (ref 150.0–400.0)
RBC: 4.69 Mil/uL (ref 3.87–5.11)
RDW: 13.2 % (ref 11.5–15.5)
WBC: 5.8 10*3/uL (ref 4.0–10.5)

## 2014-07-08 LAB — TSH: TSH: 1.63 u[IU]/mL (ref 0.35–4.50)

## 2014-07-08 LAB — LIPID PANEL
Cholesterol: 127 mg/dL (ref 0–200)
HDL: 44.6 mg/dL (ref >39.00)
LDL Cholesterol: 63 mg/dL (ref 0–99)
NONHDL: 82.4
TRIGLYCERIDES: 99 mg/dL (ref 0.0–149.0)
Total CHOL/HDL Ratio: 3
VLDL: 19.8 mg/dL (ref 0.0–40.0)

## 2014-07-08 LAB — HEMOGLOBIN A1C: Hgb A1c MFr Bld: 5.9 % (ref 4.6–6.5)

## 2014-07-08 LAB — VITAMIN D 25 HYDROXY (VIT D DEFICIENCY, FRACTURES): VITD: 18.36 ng/mL — AB (ref 30.00–100.00)

## 2014-07-08 LAB — VITAMIN B12: Vitamin B-12: 422 pg/mL (ref 211–911)

## 2014-07-08 LAB — T4, FREE: Free T4: 1.03 ng/dL (ref 0.60–1.60)

## 2014-07-08 LAB — SEDIMENTATION RATE: Sed Rate: 15 mm/hr (ref 0–22)

## 2014-07-08 NOTE — Progress Notes (Signed)
Pre visit review using our clinic review tool, if applicable. No additional management support is needed unless otherwise documented below in the visit note. 

## 2014-07-08 NOTE — Patient Instructions (Signed)
Please get neck ultrasound.  My office will call with lab results and any needed changes or follow up.  Continue minimizing sugar & refined grains.  Stretch back daily Take 200 mg ibuprophen qd if needed Also, you may take 1000 mg tylenol up to 3 times daily if needed for pain. Use heating pad as needed.  See me in 6 months

## 2014-07-08 NOTE — Progress Notes (Signed)
Subjective:     Amy Rodriguez is a 81 y.o. female presents for f/u of Vit D & b12 deficiencies, hypothyroidism, gerd, and new complaints of back ache, fullness of neck. Vit D & b12 deficiencies: Not taking B12 supplement. takng Vit D w/no SE. Hypothyroidism & fullness of neck: Had spike of TSH 1 yr ago, med increased. Stable last 2 labs. New c/o hair thinning for about 4 mos & fullness of neck for about 1 yr (never mentioned until today). Denies palpitations, temp intolerance, bowel changes.  Gerd: symptoms controlled w/ 10 mg protonix. back ache: lumbar pain. Started in last few weeks. No radiation. Mild pain. Hurts worse in am, feels better after stretching & walking. Hx DDD.  The following portions of the patient's history were reviewed and updated as appropriate: allergies, current medications, past medical history, past social history, past surgical history and problem list.  Review of Systems Constitutional: negative for fatigue, fevers and weight loss Eyes: negative for visual disturbance, wears glases-due for eye exam Ears, nose, mouth, throat, and face: positive for neck fullness when swallows, negative for sore throat Cardiovascular: negative for near-syncope and palpitations Neurological: negative for paresthesia and weakness Allergic/Immunologic: positive for seasonal allergies-started allegra   Resp: uses inhaler when she has cough/sick. No sputum or SOB   Objective:    BP 120/70 mmHg  Pulse 50  Temp(Src) 97.8 F (36.6 C) (Temporal)  Ht 5\' 2"  (1.575 m)  Wt 151 lb (68.493 kg)  BMI 27.61 kg/m2  SpO2 95% BP 120/70 mmHg  Pulse 50  Temp(Src) 97.8 F (36.6 C) (Temporal)  Ht 5\' 2"  (1.575 m)  Wt 151 lb (68.493 kg)  BMI 27.61 kg/m2  SpO2 95% General appearance: alert, cooperative, appears stated age and no distress Head: Normocephalic, without obvious abnormality, atraumatic Eyes: negative findings: lids and lashes normal and conjunctivae and sclerae normal Ears: normal  TM's and external ear canals both ears Throat: lips, mucosa, and tongue normal; teeth and gums normal and upper denture in place Neck: no adenopathy, supple, symmetrical, trachea midline and thyroid: enlarged and larger on Right, but diffusely swollen Lungs: clear to auscultation bilaterally Heart: regular rate and rhythm, S1, S2 normal, no murmur, click, rub or gallop Extremities: extremities normal, atraumatic, no cyanosis or edema Pulses: 2+ and symmetric Neurologic: Grossly normal    Assessment:Plan  1. Enlarged thyroid, new Hair thinning - TSH - Thyroid peroxidase antibody - T4, free - Sedimentation rate - Comprehensive metabolic panel - CBC with Differential/Platelet - US Soft Tissue Head/Neck; Future  2. Hyperlipidemia Hx TIA - Lipid panel - Comprehensive metabolic panel  3. Vitamin D deficiency - Vit D  25 hydroxy (rtn osteoporosis monitoring)  4. B12 deficiency - Vitamin B12  5. Prediabetes She has been limiting refined sugars & grains for 6 mos - Hemoglobin A1c - Comprehensive metabolic panel  6. Preventative health care - HIV antibody  7. Need for vaccination with 13-polyvalent pneumococcal conjugate vaccine Pt states she had "cellulitis" w/last pneumonia vaccine Instructed to call if any redness, expect soreness for 1-3 days. - Pneumococcal conjugate vaccine 13-valent IM  8. Bilateral low back pain without sciatica Stretch, heat, low dose NSAID, tylenol Refuses PT See pt instructions  F/u 6 mos, sooner PRN

## 2014-07-09 ENCOUNTER — Ambulatory Visit (INDEPENDENT_AMBULATORY_CARE_PROVIDER_SITE_OTHER): Payer: Commercial Managed Care - HMO

## 2014-07-09 ENCOUNTER — Telehealth: Payer: Self-pay | Admitting: Nurse Practitioner

## 2014-07-09 DIAGNOSIS — E049 Nontoxic goiter, unspecified: Secondary | ICD-10-CM | POA: Diagnosis not present

## 2014-07-09 DIAGNOSIS — E559 Vitamin D deficiency, unspecified: Secondary | ICD-10-CM

## 2014-07-09 LAB — THYROID PEROXIDASE ANTIBODY

## 2014-07-09 LAB — HIV ANTIBODY (ROUTINE TESTING W REFLEX): HIV 1&2 Ab, 4th Generation: NONREACTIVE

## 2014-07-09 MED ORDER — VITAMIN D3 1.25 MG (50000 UT) PO CAPS: 1.0000 | ORAL_CAPSULE | ORAL | Status: DC

## 2014-07-09 NOTE — Telephone Encounter (Signed)
pls call pt: Advise A1c has improved to 5.9 from 6.0. Continue with limiting sugar & refined grains. Vit d is too low. Start prescription. Take weekly for 12 weeks. Sch OV in 3 mos to recheck. Start back on B complex, once daily. Thyroid function is at goal, but I need to keep close eye on function. Ask if she has scheduled thyroid US.

## 2014-07-09 NOTE — Telephone Encounter (Signed)
Called and informed patient of results. Patient had thyroid US done today (07/09/14). Forgot to schedule OV while on the phone with her for 3 months, but when I call back with [place] results I will schedule OV.

## 2014-07-13 ENCOUNTER — Telehealth: Payer: Self-pay | Admitting: Nurse Practitioner

## 2014-07-13 DIAGNOSIS — E049 Nontoxic goiter, unspecified: Secondary | ICD-10-CM

## 2014-07-13 NOTE — Telephone Encounter (Signed)
pls call pt: Advise Thyroid gland is enlarged. I am referring her to [facility] for further eval. They may want to do biopsy. Their ofc will call her to set up appt. Cont thyroid med at current dose.

## 2014-07-13 NOTE — Telephone Encounter (Signed)
Called and informed patient of results and referral.

## 2014-07-30 ENCOUNTER — Ambulatory Visit (INDEPENDENT_AMBULATORY_CARE_PROVIDER_SITE_OTHER): Payer: Commercial Managed Care - HMO | Admitting: Nurse Practitioner

## 2014-07-30 ENCOUNTER — Encounter: Payer: Self-pay | Admitting: Nurse Practitioner

## 2014-07-30 ENCOUNTER — Ambulatory Visit: Payer: Commercial Managed Care - HMO | Admitting: Nurse Practitioner

## 2014-07-30 VITALS — BP 137/75 | HR 55 | Ht 62.0 in | Wt 151.8 lb

## 2014-07-30 DIAGNOSIS — R569 Unspecified convulsions: Secondary | ICD-10-CM | POA: Diagnosis not present

## 2014-07-30 DIAGNOSIS — I671 Cerebral aneurysm, nonruptured: Secondary | ICD-10-CM

## 2014-07-30 DIAGNOSIS — G40109 Localization-related (focal) (partial) symptomatic epilepsy and epileptic syndromes with simple partial seizures, not intractable, without status epilepticus: Secondary | ICD-10-CM

## 2014-07-30 NOTE — Patient Instructions (Signed)
Continue aspirin for transient episodes of  Paresthesias Repeat MRA to follow-up right MCA aneurysm Strict control of hypertension with blood pressure goal below 130/90 LDL cholesterol below 100 Follow-up in 6-8 months

## 2014-07-30 NOTE — Progress Notes (Signed)
I agree with the above plan 

## 2014-07-30 NOTE — Progress Notes (Signed)
[facility]  PATIENT: Amy Rodriguez DOB: Jun 26, 1933   REASON FOR VISIT: Follow-up for transient fleeting paresthesias involving the right hand face and lips, asymptomatic right middle cerebral artery aneurysm HISTORY FROM: Patient    HISTORY OF PRESENT ILLNESS:79 year is an elderly Caucasian lady who who on 07/25/13 who developed transient episode of fleeting numbness starting in the right hand and spreading over a few minutes into right arm subsequently the face as well as left hand completely resolving within 5 minutes. Humerus were called but since her symptoms rapidly improved she chose not to go to the hospital. She called her primary care physician the next day her last ordered ER and she was hospitalized for observation and underwent MRI scan of the brain and MRA of the brain which have personally reviewed show no acute infarct but showed tiny 2 mm outpouching of the superior division of the right middle cerebral artery possibly a small asymptomatic aneurysm. Transthoracic echo showed normal ejection fraction without obvious correction of embolism. Carotid Dopplers revealed no significant obstructive stenosis. For lipid profile showed an LDL of 69. She was found to be slightly hypothyroid and temperature continued Synthroid. Hemoglobin A1c was 5.8. Patient was started on aspirin and has done well and has not had any further recurrent TIA or stroke symptoms. She has remote history of migraines and occasionally seemed jagged lights and patent but states during this episode she did not have those symptoms or any headaches. She has no prior history of strokes TIAs seizures. No prior history of significant head injury with loss of consciousness. No family history of epilepsy or seizures. Is no family history of intracranial aneurysms or subarachnoid hemorrhage. Update 07/30/14 : Amy Rodriguez, 81 year old female returns for follow-up. She was last seen in this office by Dr. Leonie Rodriguez  01/28/2014. She is doing well without recurrent episodes of paresthesias or tingling. She underwent EEG horn 11/13/2013 which was normal. She remains on aspirin . She states her blood pressure is under good control and it is 137/75 in the office today. She is tolerating Zocor well without any side effects. She complains of mild ringing in the ears as well as short-term memory difficulties but these are not progressive or bothersome. Previous MRA of the brain showed  a small asymptomatic aneurysm. She needs repeat MRA. She returns for reevaluation   REVIEW OF SYSTEMS: Full 14 system review of systems performed and notable only for those listed, all others are neg:  Constitutional: neg  Cardiovascular: neg Ear/Nose/Throat: Ringing in the ears Skin: neg Eyes: neg Respiratory: neg Gastroitestinal: neg  Hematology/Lymphatic: neg  Endocrine: neg Musculoskeletal:neg Allergy/Immunology: neg Neurological: neg Psychiatric: neg Sleep : neg   ALLERGIES: Allergies  Allergen Reactions  . Benzonatate Nausea And Vomiting    "tessalon pearl"  . Doxycycline Nausea And Vomiting  . Sulfonamide Derivatives Itching and Rash  . Amlodipine Other (See Comments)    Heart palpitations     HOME MEDICATIONS: Outpatient Prescriptions Prior to Visit  Medication Sig Dispense Refill  . albuterol (PROVENTIL HFA;VENTOLIN HFA) 108 (90 BASE) MCG/ACT inhaler Inhale 2 puffs into the lungs every 6 (six) hours as needed for wheezing. 1 Inhaler 3  . atenolol (TENORMIN) 25 MG tablet Take 0.5 tablets (12.5 mg total) by mouth 2 (two) times daily. 90 tablet 1  . Cholecalciferol (VITAMIN D3) 50000 UNITS CAPS Take 1 capsule by mouth every 7 (seven) days. 12 capsule 0  . Cyanocobalamin (VITAMIN B12 PO) Take 15 mLs by mouth daily.     Marland Kitchen  fexofenadine (ALLEGRA) 180 MG tablet Take 1 tablet (180 mg total) by mouth daily as needed. Allergies 90 tablet 3  . fluticasone-salmeterol (ADVAIR HFA) 230-21 MCG/ACT inhaler Inhale 2 puffs  into the lungs 2 (two) times daily. (Patient taking differently: Inhale 2 puffs into the lungs 2 (two) times daily as needed. Pt will use if she is treated for resp infection) 1 Inhaler 6  . levothyroxine (SYNTHROID, LEVOTHROID) 75 MCG tablet Take 1 tablet (75 mcg total) by mouth daily before breakfast. 90 tablet 1  . omeprazole (PRILOSEC) 10 MG capsule Take 1 capsule (10 mg total) by mouth daily. (Patient taking differently: Take 10 mg by mouth as needed (for heartburn). ) 90 capsule 0  . simvastatin (ZOCOR) 10 MG tablet Take 1 tablet (10 mg total) by mouth every evening. 90 tablet 1  . VOLTAREN 1 % GEL   2   No facility-administered medications prior to visit.    PAST MEDICAL HISTORY: Past Medical History  Diagnosis Date  . Supraventricular tachycardia, paroxysmal   . HTN (hypertension)   . Hyperlipidemia   . Postmenopausal HRT (hormone replacement therapy)   . Other seborrheic keratosis   . Vaginal pruritus   . Urinary frequency   . Diverticulosis of colon   . Blood in stool   . Pain in joint, lower leg   . Osteopenia   . IBS (irritable bowel syndrome)   . Hypothyroidism     TSH 14.488 (11/2005)  . H pylori ulcer     not treated due to expense  10/2001  . PPD positive     history +PPD 1984, no treatment, no abnormal CXR  . Posterior vitreous detachment 1996  . Multiple pigmented nevi     last derm evaluation 01/2003  . Postmenopausal     s/p hysterectomy for h/o cervical cancer 1982 (both ovaries taken at that time) now on hormonal replacement   . Shortness of breath     with ambulation  . Environmental allergies   . Lactose intolerance   . GERD (gastroesophageal reflux disease)   . Hand dermatitis   . Cervical cancer     s/p hysterectomy/oop  . PONV (postoperative nausea and vomiting)     "and takes me a long time to come out under it" (02/08/2012)  . Pneumonia     "couple times in my lifetime" (02/08/2012)  . History of bronchitis     "w/a cold" (02/08/2012)  .  Migraines     "get them very rarely now" (02/08/2012)  . OA (osteoarthritis)     multiple sites  . Arthritis     sed rate 10, RF, CCP pending  . DDD (degenerative disc disease), lumbosacral   . Osteoporosis     "borderline" (02/08/2012)  . Depression     Due to husbands passing away 09/22/2011    PAST SURGICAL HISTORY: Past Surgical History  Procedure Laterality Date  . Eye surgery      cataract removal right eye  . Colonoscopy    . Total abdominal hysterectomy w/ bilateral salpingoophorectomy  1982  . Cataract extraction w/ intraocular lens implant  ?1997    right  . Total knee arthroplasty  02/08/2012    Procedure: TOTAL KNEE ARTHROPLASTY;  Surgeon: Hessie Dibble, MD;  Location: [facility];  Service: Orthopedics;  Laterality: Left;  . Cardiovascular stress test  12/07/11    Normal nuclear stress test  . Abdominal hysterectomy  1982    FAMILY HISTORY: Family History  Problem Relation Age  of Onset  . Breast cancer Mother 36  . Lung cancer Brother 51  . Cancer Brother     lung  . Heart failure Father     congestive  . Heart disease Father   . Epilepsy Son   . Kidney disease Son     tuberous sclerosis, both kidneys removed, has transplant  . Diabetes Maternal Uncle   . Diabetes Paternal Aunt   . Cancer Maternal Grandmother     breast  . Heart disease Son     [place]  . COPD Son     SOCIAL HISTORY: History   Social History  . Marital Status: Married    Spouse Name: N/A  . Number of Children: 3  . Years of Education: 12TH   Occupational History  . retired     Scientist, research (medical)  .     Social History Main Topics  . Smoking status: Former Smoker -- 1.00 packs/day for 25 years    Types: Cigarettes    Quit date: 04/17/1980  . Smokeless tobacco: Former Systems developer    Quit date: 03/16/1984     Comment: passive smoker, husband smoked  . Alcohol Use: No  . Drug Use: No  . Sexual Activity: No   Other Topics Concern  . Not on file   Social History Narrative   Lives  with son, [name] who has medical problems-they help each other. Her husband died Oct 26, 2022 after 3 years of marriage.  She has 2 other sons that live within 2 hrs from her. She does not drive-she uses [facility]. She used to work in Scientist, research (medical), retired.     Tobacco: 25 pack yr hx, quit 1985.     PHYSICAL EXAM  Filed Vitals:   07/30/14 1452  BP: 137/75  Pulse: 55  Height: 5\' 2"  (1.575 m)  Weight: 151 lb 12.8 oz (68.856 kg)   Body mass index is 27.76 kg/(m^2). General: well developed, well nourished pleasant elderly Caucasian lady, seated, in no evident distress Head: head normocephalic and atraumatic. Orohparynx benign Neck: supple with no carotid or supraclavicular bruits Cardiovascular: regular rate and rhythm, no murmurs Musculoskeletal: mild kyphosis Skin: no rash/petichiae Vascular: Normal pulses all extremities Neurologic Exam Mental Status: Awake and fully alert. Oriented to place and time. Recent and remote memory intact. Attention span, concentration and fund of knowledge appropriate. Mood and affect appropriate.  Cranial Nerves: Fundoscopic exam not done Pupils equal, briskly reactive to light. Extraocular movements full without nystagmus. Visual fields full to confrontation. Hearing intact. Facial sensation intact. Face, tongue, palate moves normally and symmetrically.  Motor: Normal bulk and tone. Normal strength in all tested extremity muscles. Sensory.: intact to tough and pinprick and vibratory sensation.  Coordination: Rapid alternating movements normal in all extremities. Finger-to-nose and heel-to-shin performed accurately bilaterally. Gait and Station: Arises from chair without difficulty. Stance is normal. Gait demonstrates normal stride length and balance . Able to heel, toe and tandem walk without difficulty.  Reflexes: 1+ and symmetric. Toes downgoing.  DIAGNOSTIC DATA (LABS, IMAGING, TESTING) - I reviewed patient records, labs, notes, testing and imaging  myself where available.  Lab Results  Component Value Date   WBC 5.8 07/08/2014   HGB 13.3 07/08/2014   HCT 39.0 07/08/2014   MCV 83.1 07/08/2014   PLT 201.0 07/08/2014      Component Value Date/Time   NA 134* 07/08/2014 1356   K 4.4 07/08/2014 1356   CL 103 07/08/2014 1356   CO2 27 07/08/2014 1356  GLUCOSE 93 07/08/2014 1356   BUN 9 07/08/2014 1356   CREATININE 0.79 07/08/2014 1356   CREATININE 0.78 03/18/2012 1114   CALCIUM 8.8 07/08/2014 1356   PROT 6.8 07/08/2014 1356   ALBUMIN 4.2 07/08/2014 1356   AST 17 07/08/2014 1356   ALT 10 07/08/2014 1356   ALKPHOS 71 07/08/2014 1356   BILITOT 0.4 07/08/2014 1356   GFRNONAA 67* 03/01/2014 1612   GFRAA 77* 03/01/2014 1612   Lab Results  Component Value Date   CHOL 127 07/08/2014   HDL 44.60 07/08/2014   LDLCALC 63 07/08/2014   LDLDIRECT 68 12/05/2011   TRIG 99.0 07/08/2014   CHOLHDL 3 07/08/2014   Lab Results  Component Value Date   HGBA1C 5.9 07/08/2014   Lab Results  Component Value Date   INOMVEHM09 470 07/08/2014   Lab Results  Component Value Date   TSH 1.63 07/08/2014      ASSESSMENT AND PLAN  81 y.o. year old female  has a past medical history of transient fleeting paresthesias involving the right hand, face, and lips lasting about 5 minutes in April 2015 incidental tiny asymptomatic right middle cerebral artery aneurysm on MRA.  Continue aspirin for transient episodes of  Paresthesias Repeat MRA to follow-up right MCA aneurysm Strict control of hypertension with blood pressure goal below 130/90 LDL cholesterol below 100 reviewed recent lipids and LDL 68 Follow-up in 6-8 months Dennie Bible, [facility], [facility], APRN  [facility] [address], [place] [employer], [place] [address] [phone]

## 2014-08-05 ENCOUNTER — Encounter: Payer: Self-pay | Admitting: Internal Medicine

## 2014-08-05 ENCOUNTER — Ambulatory Visit (INDEPENDENT_AMBULATORY_CARE_PROVIDER_SITE_OTHER): Payer: Commercial Managed Care - HMO | Admitting: Internal Medicine

## 2014-08-05 VITALS — BP 136/72 | HR 56 | Temp 97.2°F | Resp 12 | Ht 62.0 in | Wt 150.8 lb

## 2014-08-05 DIAGNOSIS — E063 Autoimmune thyroiditis: Secondary | ICD-10-CM | POA: Diagnosis not present

## 2014-08-05 DIAGNOSIS — E040 Nontoxic diffuse goiter: Secondary | ICD-10-CM | POA: Diagnosis not present

## 2014-08-05 DIAGNOSIS — E038 Other specified hypothyroidism: Secondary | ICD-10-CM | POA: Diagnosis not present

## 2014-08-05 NOTE — Progress Notes (Signed)
[name], female   DOB: 1933/08/16, 81 y.o.   MRN: [id number]   HPI  Amy Rodriguez is a 81 y.o.-year-old female, referred by her PCP, [name], [name], NP, for management of hypothyroidism 2/2 to Hashimoto's thyroiditis and R goiter.  Pt. has been dx with hypothyroidism in ~2011; is on Levothyroxine 75 mcg (increased 07/2013), taken: - fasting - with water - separated by >30-60 min from b'fast  - no calcium, iron, multivitamins  - + PPIs as needed- later in the day  I reviewed pt's thyroid tests: Lab Results  Component Value Date   TSH 1.63 07/08/2014   TSH 1.15 12/19/2013   TSH 0.78 09/19/2013   TSH 8.120* 07/26/2013   TSH 3.56 12/17/2012   TSH 2.374 03/18/2012   TSH 15.257* 02/11/2012   TSH 0.906 12/05/2011   TSH 2.301 09/20/2010   TSH 2.271 10/26/2009   FREET4 1.03 07/08/2014   FREET4 1.39 03/18/2012    Component     Latest Ref Rng 07/08/2014  Thyroperoxidase Ab SerPl-aCnc     <9 IU/mL >900 (H)   Pt describes: - + weight gain (recovering from PNA) - + fatigue - no cold intolerance - no depression - no constipation - + dry skin - + hair loss  Patient was also found to have an enlarged right thyroid lobe on palpation and was sent to have an ultrasound performed. The ultrasound (07/09/2014) showed an enlarged right lobe, with hypoechoic aspect and moth-eaten appearance suggestive of Hashimoto's thyroiditis, and a normal left lobe. No nodules.  Pt is feeling a nodule in R neck, no hoarseness, + dysphagia/no odynophagia, no choking, no SOB with lying down.  She has + FH of thyroid disorders in: great uncle. No FH of thyroid cancer.  No h/o radiation tx to head or neck. No recent use of iodine supplements.  I reviewed her chart and she also has a history of Hl, HTN, GERD, vit D def., bronchiectasis, TIA (small aneurysm - being followed by Dr Leonie Man.  ROS: Constitutional: + see HPI Eyes: no blurry vision, no xerophthalmia ENT: no sore throat, no  nodules palpated in throat, no dysphagia/odynophagia, no hoarseness, + tinnitus Cardiovascular: no CP/+ SOB/no palpitations/leg swelling Respiratory: no cough/+ SOB Gastrointestinal: no N/V/D/Rodriguez/+ reflux Musculoskeletal: + muscle/+ joint aches (OA), had L TKR 2013 Skin: no rashes, + itching, + hair loss Neurological: no tremors/numbness/tingling/dizziness Psychiatric: no depression/anxiety  Past Medical History  Diagnosis Date  . Supraventricular tachycardia, paroxysmal   . HTN (hypertension)   . Hyperlipidemia   . Postmenopausal HRT (hormone replacement therapy)   . Other seborrheic keratosis   . Vaginal pruritus   . Urinary frequency   . Diverticulosis of colon   . Blood in stool   . Pain in joint, lower leg   . Osteopenia   . IBS (irritable bowel syndrome)   . Hypothyroidism     TSH 14.488 (11/2005)  . H pylori ulcer     not treated due to expense  10/2001  . PPD positive     history +PPD 1984, no treatment, no abnormal CXR  . Posterior vitreous detachment 1996  . Multiple pigmented nevi     last derm evaluation 01/2003  . Postmenopausal     s/p hysterectomy for h/o cervical cancer 1982 (both ovaries taken at that time) now on hormonal replacement   . Shortness of breath     with ambulation  . Environmental allergies   . Lactose intolerance   . GERD (  gastroesophageal reflux disease)   . Hand dermatitis   . Cervical cancer     s/p hysterectomy/oop  . PONV (postoperative nausea and vomiting)     "and takes me a long time to come out under it" (02/08/2012)  . Pneumonia     "couple times in my lifetime" (02/08/2012)  . History of bronchitis     "w/a cold" (02/08/2012)  . Migraines     "get them very rarely now" (02/08/2012)  . OA (osteoarthritis)     multiple sites  . Arthritis     sed rate 10, RF, CCP pending  . DDD (degenerative disc disease), lumbosacral   . Osteoporosis     "borderline" (02/08/2012)  . Depression     Due to husbands passing away 09/22/2011    Past Surgical History  Procedure Laterality Date  . Eye surgery      cataract removal right eye  . Colonoscopy    . Total abdominal hysterectomy w/ bilateral salpingoophorectomy  1982  . Cataract extraction w/ intraocular lens implant  ?1997    right  . Total knee arthroplasty  02/08/2012    Procedure: TOTAL KNEE ARTHROPLASTY;  Surgeon: Hessie Dibble, MD;  Location: [facility];  Service: Orthopedics;  Laterality: Left;  . Cardiovascular stress test  12/07/11    Normal nuclear stress test  . Abdominal hysterectomy  1982   History   Social History  . Marital Status: Married    Spouse Name: N/A  . Number of Children: 3  . Years of Education: 12TH   Occupational History  . retired     Scientist, research (medical)  .     Social History Main Topics  . Smoking status: Former Smoker -- 1.00 packs/day for 25 years    Types: Cigarettes    Quit date: 04/17/1980  . Smokeless tobacco: Former Systems developer    Quit date: 03/16/1984     Comment: passive smoker, husband smoked  . Alcohol Use: No  . Drug Use: No  . Sexual Activity: No   Other Topics Concern  . Not on file   Social History Narrative   Lives with son, [name] who has medical problems-they help each other. Her husband died 2022/10/10 after 94 years of marriage.  She has 2 other sons that live within 2 hrs from her. She does not drive-she uses [facility]. She used to work in Scientist, research (medical), retired.     Tobacco: 25 pack yr hx, quit 1985.   Current Outpatient Prescriptions on File Prior to Visit  Medication Sig Dispense Refill  . albuterol (PROVENTIL HFA;VENTOLIN HFA) 108 (90 BASE) MCG/ACT inhaler Inhale 2 puffs into the lungs every 6 (six) hours as needed for wheezing. 1 Inhaler 3  . aspirin EC 81 MG tablet Take 81 mg by mouth daily.    Marland Kitchen atenolol (TENORMIN) 25 MG tablet Take 0.5 tablets (12.5 mg total) by mouth 2 (two) times daily. 90 tablet 1  . Cholecalciferol (VITAMIN D3) 50000 UNITS CAPS Take 1 capsule by mouth every 7 (seven) days. 12 capsule 0  .  Cyanocobalamin (VITAMIN B12 PO) Take 15 mLs by mouth daily.     . fexofenadine (ALLEGRA) 180 MG tablet Take 1 tablet (180 mg total) by mouth daily as needed. Allergies 90 tablet 3  . fluticasone-salmeterol (ADVAIR HFA) 230-21 MCG/ACT inhaler Inhale 2 puffs into the lungs 2 (two) times daily. (Patient taking differently: Inhale 2 puffs into the lungs 2 (two) times daily as needed. Pt will use if she is treated  for resp infection) 1 Inhaler 6  . levothyroxine (SYNTHROID, LEVOTHROID) 75 MCG tablet Take 1 tablet (75 mcg total) by mouth daily before breakfast. 90 tablet 1  . omeprazole (PRILOSEC) 10 MG capsule Take 1 capsule (10 mg total) by mouth daily. (Patient taking differently: Take 10 mg by mouth as needed (for heartburn). ) 90 capsule 0  . simvastatin (ZOCOR) 10 MG tablet Take 1 tablet (10 mg total) by mouth every evening. 90 tablet 1  . VOLTAREN 1 % GEL   2   No current facility-administered medications on file prior to visit.   Allergies  Allergen Reactions  . Benzonatate Nausea And Vomiting    "tessalon pearl"  . Sulfonamide Derivatives Itching and Rash  . Amlodipine Other (See Comments)    Heart palpitations    Family History  Problem Relation Age of Onset  . Breast cancer Mother 74  . Lung cancer Brother 52  . Cancer Brother     lung  . Heart failure Father     congestive  . Heart disease Father   . Epilepsy Son   . Kidney disease Son     tuberous sclerosis, both kidneys removed, has transplant  . Diabetes Maternal Uncle   . Diabetes Paternal Aunt   . Cancer Maternal Grandmother     breast  . Heart disease Son     [place]  . COPD Son    PE: BP 136/72 mmHg  Pulse 56  Temp(Src) 97.2 F (36.2 Rodriguez) (Oral)  Resp 12  Ht 5\' 2"  (1.575 m)  Wt 150 lb 12.8 oz (68.402 kg)  BMI 27.57 kg/m2  SpO2 95% Wt Readings from Last 3 Encounters:  08/05/14 150 lb 12.8 oz (68.402 kg)  07/30/14 151 lb 12.8 oz (68.856 kg)  07/08/14 151 lb (68.493 kg)   Constitutional: normal  weight, in NAD Eyes: PERRLA, EOMI, no exophthalmos ENT: moist mucous membranes, + R thyromegaly, no cervical lymphadenopathy Cardiovascular: bradycardia, RR, No MRG Respiratory: CTA B Gastrointestinal: abdomen soft, NT, ND, BS+ Musculoskeletal: + deformities in fingers (heberden nodules), strength intact in all 4 Skin: moist, warm, no rashes Neurological: no tremor with outstretched hands, DTR normal in all 4  ASSESSMENT: 1. Hypothyroidism  2. Goiter - Thyroid U/S (07/09/2014):   Right thyroid lobe: 7.6 x 3.3 x 3.6 cm. The right thyroid lobe is diffusely enlarged and hypoechoic. Right thyroid tissue is mildly lobular but there is not a focal nodule. (honeycomb appearance)   Left thyroid lobe: 5.1 x 2.2 x 2.2 cm. Left thyroid tissue is heterogeneous but more hyperechoic than the right thyroid lobe and isthmus. There is not a focal nodule. Left thyroid tissue is very vascular.   Isthmus Thickness: 0.8 cm. Isthmus is prominent for size and has a similar hypoechoic appearance as the right thyroid lobe.   Lymphadenopathy None visualized.    PLAN:  1. Patient with several years of hypothyroidism, on levothyroxine therapy. She appears euthyroid.  - we reviewed her recent thyroid test together - they were normal less than a month ago - No need to repeat the thyroid tests today - We discussed about correct intake of levothyroxine, fasting, with water, separated by at least 30 minutes from breakfast, and separated by more than 4 hours from calcium, iron, multivitamins, acid reflux medications (PPIs).she is taking it correctly  2. Right thyroid lobe enlargement - I reviewed the images of her recent thyroid ultrasound along with the patient. Patient has a new large thyroid lobe shown on the ultrasound  from 07/09/2014. There are no focal nodules, and the consistency is moth-eaten, consistent with Hashimoto thyroiditis. The right lobe also contains septations and a hypoechoic  appearance - I reassured the patient that his is not an alarming finding, and its not unusual in Hashimoto thyroiditis. - I also explained that, due to the high titer of her TPO antibodies, this can cause various amounts of inflammation in her thyroid, with subsequent enlargement. The Hashimoto's thyroid will usually fluctuate in size, with decreases when her antibodies are lower.  - we discussed about ways to decrease the tightness of the antibodies, which include measures to improve her immune system - we discussed about possible causes for Hashimoto thyroiditis - I also gave her a handout about Hashimoto thyroiditis   Return in about 6 months (around 02/04/2015).

## 2014-08-05 NOTE — Patient Instructions (Signed)
Please continue current Levothyroxine dose, of 75 mcg daily.  Take the thyroid hormone every day, with water, >30 minutes before breakfast, separated by >4 hours from acid reflux medications, calcium, iron, multivitamins.  Please come back for a follow-up appointment in 6 months

## 2014-08-18 ENCOUNTER — Ambulatory Visit: Payer: Medicare HMO | Admitting: Cardiology

## 2014-08-21 ENCOUNTER — Ambulatory Visit
Admission: RE | Admit: 2014-08-21 | Discharge: 2014-08-21 | Disposition: A | Discharge status: Home / Self Care | Payer: Commercial Managed Care - HMO | Source: Ambulatory Visit | Attending: Nurse Practitioner | Admitting: Nurse Practitioner

## 2014-08-21 DIAGNOSIS — G40109 Localization-related (focal) (partial) symptomatic epilepsy and epileptic syndromes with simple partial seizures, not intractable, without status epilepticus: Secondary | ICD-10-CM

## 2014-08-21 DIAGNOSIS — R569 Unspecified convulsions: Principal | ICD-10-CM

## 2014-08-21 DIAGNOSIS — I671 Cerebral aneurysm, nonruptured: Secondary | ICD-10-CM | POA: Diagnosis not present

## 2014-08-24 NOTE — Progress Notes (Signed)
Quick Note:  I spoke to pt and relayed that the outpouching of the MCA artery appears unchanged compared with the previous MRA 07-26-13. Relayed that she has 2 vessels that are smaller then normal (born that way). Released to mychart. ______

## 2014-09-15 ENCOUNTER — Other Ambulatory Visit: Payer: Self-pay

## 2014-09-15 MED ORDER — ATENOLOL 25 MG PO TABS: 12.5000 mg | ORAL_TABLET | Freq: Two times a day (BID) | ORAL | Status: DC

## 2014-09-18 ENCOUNTER — Other Ambulatory Visit: Payer: Self-pay | Admitting: *Deleted

## 2014-09-18 ENCOUNTER — Other Ambulatory Visit: Payer: Self-pay

## 2014-09-18 MED ORDER — ATENOLOL 25 MG PO TABS: 12.5000 mg | ORAL_TABLET | Freq: Two times a day (BID) | ORAL | Status: DC

## 2014-10-12 ENCOUNTER — Other Ambulatory Visit: Payer: Self-pay | Admitting: Family Medicine

## 2014-10-12 ENCOUNTER — Other Ambulatory Visit: Payer: Self-pay | Admitting: Nurse Practitioner

## 2014-10-12 DIAGNOSIS — E063 Autoimmune thyroiditis: Principal | ICD-10-CM

## 2014-10-12 DIAGNOSIS — E559 Vitamin D deficiency, unspecified: Secondary | ICD-10-CM

## 2014-10-12 DIAGNOSIS — E785 Hyperlipidemia, unspecified: Secondary | ICD-10-CM

## 2014-10-12 DIAGNOSIS — E039 Hypothyroidism, unspecified: Secondary | ICD-10-CM

## 2014-10-12 DIAGNOSIS — E038 Other specified hypothyroidism: Secondary | ICD-10-CM

## 2014-10-12 MED ORDER — LEVOTHYROXINE SODIUM 75 MCG PO TABS: 75.0000 ug | ORAL_TABLET | Freq: Every day | ORAL | Status: DC

## 2014-10-12 MED ORDER — SIMVASTATIN 10 MG PO TABS
10.0000 mg | ORAL_TABLET | Freq: Every evening | ORAL | Status: DC
Start: 2014-10-12 — End: 2015-01-08

## 2014-10-12 NOTE — Telephone Encounter (Signed)
LMOM for pt to CB and schedule lab appointment.

## 2014-10-12 NOTE — Telephone Encounter (Signed)
Lab visit scheduled 10/20/14.

## 2014-10-12 NOTE — Telephone Encounter (Signed)
pls sched lab appt-I will order vit D & thyroid labs & forward to DR Cruzita Lederer. I will send in cholesterol med. DR Cruzita Lederer is managing thyroid now. She has appt in Oct.

## 2014-10-20 ENCOUNTER — Other Ambulatory Visit (INDEPENDENT_AMBULATORY_CARE_PROVIDER_SITE_OTHER): Payer: Commercial Managed Care - HMO

## 2014-10-20 DIAGNOSIS — E063 Autoimmune thyroiditis: Secondary | ICD-10-CM

## 2014-10-20 DIAGNOSIS — E038 Other specified hypothyroidism: Secondary | ICD-10-CM

## 2014-10-20 DIAGNOSIS — E559 Vitamin D deficiency, unspecified: Secondary | ICD-10-CM

## 2014-10-20 LAB — TSH: TSH: 2.47 u[IU]/mL (ref 0.35–4.50)

## 2014-10-20 LAB — VITAMIN D 25 HYDROXY (VIT D DEFICIENCY, FRACTURES): VITD: 65.24 ng/mL (ref 30.00–100.00)

## 2014-10-21 ENCOUNTER — Telehealth: Payer: Self-pay | Admitting: Nurse Practitioner

## 2014-10-21 LAB — THYROID PEROXIDASE ANTIBODY: Thyroperoxidase Ab SerPl-aCnc: >900 IU/mL — ABNORMAL HIGH (ref <9)

## 2014-10-21 NOTE — Telephone Encounter (Signed)
Left detailed message on pt's home phone.  OKay per DPR.

## 2014-10-21 NOTE — Telephone Encounter (Signed)
pls call pt: Advise Thyroid at goal. No changes. Keep appt w/Dr gherghe in Oct. Vit d at goal. Start 1000 iu D3 OTC daily for maintenance dose.

## 2014-10-22 NOTE — Progress Notes (Signed)
HPI: FU SVT. Nuclear study in August of 2013 showed an ejection fraction of 76% and normal perfusion. Holter 12/14 showed sinus with pacs, pvcs and rare couplet. Echo 4/15 showed normal LV function. Carotid dopplers 4/15 showed 1-39 bilateral stenosis. Admitted with possible TIA 4/15. Since last seen, she has dyspnea with more moderate activities but not routine activities. No orthopnea, PND, pedal edema, syncope or chest pain.  Current Outpatient Prescriptions  Medication Sig Dispense Refill  . albuterol (PROVENTIL HFA;VENTOLIN HFA) 108 (90 BASE) MCG/ACT inhaler Inhale 2 puffs into the lungs every 6 (six) hours as needed for wheezing. 1 Inhaler 3  . aspirin EC 81 MG tablet Take 81 mg by mouth daily.    Marland Kitchen atenolol (TENORMIN) 25 MG tablet Take 0.5 tablets (12.5 mg total) by mouth 2 (two) times daily. 30 tablet 0  . Cholecalciferol (VITAMIN D3) 50000 UNITS CAPS Take 1 capsule by mouth every 7 (seven) days. 12 capsule 0  . Cyanocobalamin (VITAMIN B12 PO) Take 15 mLs by mouth daily.     . fexofenadine (ALLEGRA) 180 MG tablet Take 1 tablet (180 mg total) by mouth daily as needed. Allergies 90 tablet 3  . fluticasone-salmeterol (ADVAIR HFA) 230-21 MCG/ACT inhaler Inhale 2 puffs into the lungs 2 (two) times daily. (Patient taking differently: Inhale 2 puffs into the lungs 2 (two) times daily as needed. Pt will use if she is treated for resp infection) 1 Inhaler 6  . levothyroxine (SYNTHROID, LEVOTHROID) 75 MCG tablet Take 1 tablet (75 mcg total) by mouth daily before breakfast. 90 tablet 0  . omeprazole (PRILOSEC) 10 MG capsule Take 1 capsule (10 mg total) by mouth daily. (Patient taking differently: Take 10 mg by mouth as needed (for heartburn). ) 90 capsule 0  . simvastatin (ZOCOR) 10 MG tablet Take 1 tablet (10 mg total) by mouth every evening. 90 tablet 0  . VOLTAREN 1 % GEL   2   No current facility-administered medications for this visit.     Past Medical History  Diagnosis Date  .  Supraventricular tachycardia, paroxysmal   . HTN (hypertension)   . Hyperlipidemia   . Postmenopausal HRT (hormone replacement therapy)   . Other seborrheic keratosis   . Vaginal pruritus   . Urinary frequency   . Diverticulosis of colon   . Blood in stool   . Pain in joint, lower leg   . Osteopenia   . IBS (irritable bowel syndrome)   . Hypothyroidism     TSH 14.488 (11/2005)  . H pylori ulcer     not treated due to expense  10/2001  . PPD positive     history +PPD 1984, no treatment, no abnormal CXR  . Posterior vitreous detachment 1996  . Multiple pigmented nevi     last derm evaluation 01/2003  . Postmenopausal     s/p hysterectomy for h/o cervical cancer 1982 (both ovaries taken at that time) now on hormonal replacement   . Shortness of breath     with ambulation  . Environmental allergies   . Lactose intolerance   . GERD (gastroesophageal reflux disease)   . Hand dermatitis   . Cervical cancer     s/p hysterectomy/oop  . PONV (postoperative nausea and vomiting)     "and takes me a long time to come out under it" (02/08/2012)  . Pneumonia     "couple times in my lifetime" (02/08/2012)  . History of bronchitis     "w/a cold" (02/08/2012)  .  Migraines     "get them very rarely now" (02/08/2012)  . OA (osteoarthritis)     multiple sites  . Arthritis     sed rate 10, RF, CCP pending  . DDD (degenerative disc disease), lumbosacral   . Osteoporosis     "borderline" (02/08/2012)  . Depression     Due to husbands passing away 09/22/2011    Past Surgical History  Procedure Laterality Date  . Eye surgery      cataract removal right eye  . Colonoscopy    . Total abdominal hysterectomy w/ bilateral salpingoophorectomy  1982  . Cataract extraction w/ intraocular lens implant  ?1997    right  . Total knee arthroplasty  02/08/2012    Procedure: TOTAL KNEE ARTHROPLASTY;  Surgeon: Hessie Dibble, MD;  Location: [facility];  Service: Orthopedics;  Laterality: Left;  .  Cardiovascular stress test  12/07/11    Normal nuclear stress test  . Abdominal hysterectomy  1982    History   Social History  . Marital Status: Married    Spouse Name: N/A  . Number of Children: 3  . Years of Education: 12TH   Occupational History  . retired     Scientist, research (medical)  .     Social History Main Topics  . Smoking status: Former Smoker -- 1.00 packs/day for 25 years    Types: Cigarettes    Quit date: 04/17/1980  . Smokeless tobacco: Former Systems developer    Quit date: 03/16/1984     Comment: passive smoker, husband smoked  . Alcohol Use: No  . Drug Use: No  . Sexual Activity: No   Other Topics Concern  . Not on file   Social History Narrative   Lives with son, [name] who has medical problems-they help each other. Her husband died 10-20-22 after 75 years of marriage.  She has 2 other sons that live within 2 hrs from her. She does not drive-she uses [facility]. She used to work in Scientist, research (medical), retired.     Tobacco: 25 pack yr hx, quit 1985.    ROS: no fevers or chills, productive cough, hemoptysis, dysphasia, odynophagia, melena, hematochezia, dysuria, hematuria, rash, seizure activity, orthopnea, PND, pedal edema, claudication. Remaining systems are negative.  Physical Exam: Well-developed well-nourished in no acute distress.  Skin is warm and dry.  HEENT is normal.  Neck is supple. Thyromegaly noted Chest with diminished BS Cardiovascular exam is regular rate and rhythm.  Abdominal exam nontender or distended. No masses palpated. Extremities show no edema. neuro grossly intact  ECG sinus rhythm at a rate of 54. Right bundle branch block.

## 2014-10-23 ENCOUNTER — Encounter: Payer: Self-pay | Admitting: Nurse Practitioner

## 2014-10-23 ENCOUNTER — Encounter: Payer: Self-pay | Admitting: Cardiology

## 2014-10-23 ENCOUNTER — Ambulatory Visit (INDEPENDENT_AMBULATORY_CARE_PROVIDER_SITE_OTHER): Payer: Commercial Managed Care - HMO | Admitting: Cardiology

## 2014-10-23 VITALS — BP 168/84 | HR 54 | Ht 62.0 in | Wt 150.0 lb

## 2014-10-23 DIAGNOSIS — I679 Cerebrovascular disease, unspecified: Secondary | ICD-10-CM | POA: Diagnosis not present

## 2014-10-23 DIAGNOSIS — I739 Peripheral vascular disease, unspecified: Secondary | ICD-10-CM

## 2014-10-23 DIAGNOSIS — Z79899 Other long term (current) drug therapy: Secondary | ICD-10-CM | POA: Diagnosis not present

## 2014-10-23 DIAGNOSIS — I471 Supraventricular tachycardia: Secondary | ICD-10-CM

## 2014-10-23 DIAGNOSIS — I10 Essential (primary) hypertension: Secondary | ICD-10-CM

## 2014-10-23 DIAGNOSIS — E785 Hyperlipidemia, unspecified: Secondary | ICD-10-CM

## 2014-10-23 MED ORDER — LISINOPRIL 5 MG PO TABS: 5.0000 mg | ORAL_TABLET | Freq: Every day | ORAL | Status: DC

## 2014-10-23 NOTE — Assessment & Plan Note (Signed)
Continue aspirin and statin. Schedule followup carotid Dopplers. 

## 2014-10-23 NOTE — Patient Instructions (Signed)
Your physician wants you to follow-up in: 1 Year. You will receive a reminder letter in the mail two months in advance. If you don't receive a letter, please call our office to schedule the follow-up appointment.  Your physician recommends that you return for lab work in: [place] has requested that you have a carotid duplex. This test is an ultrasound of the carotid arteries in your neck. It looks at blood flow through these arteries that supply the brain with blood. Allow one hour for this exam. There are no restrictions or special instructions.  Your physician has recommended you make the following change in your medication: START Lisinopril 5 mg daily

## 2014-10-23 NOTE — Assessment & Plan Note (Signed)
No recent episodes. Continue beta blocker.

## 2014-10-23 NOTE — Assessment & Plan Note (Signed)
Blood pressure elevated. Add lisinopril 5 mg daily. Increase as needed for blood pressure control. Check potassium and renal function in 1 week.

## 2014-10-23 NOTE — Assessment & Plan Note (Signed)
Continue statin. 

## 2014-10-31 LAB — BASIC METABOLIC PANEL
BUN: 11 mg/dL (ref 6–23)
CO2: 29 mEq/L (ref 19–32)
CREATININE: 0.87 mg/dL (ref 0.50–1.10)
Calcium: 8.8 mg/dL (ref 8.4–10.5)
Chloride: 100 mEq/L (ref 96–112)
Glucose, Bld: 73 mg/dL (ref 70–99)
Potassium: 4.4 mEq/L (ref 3.5–5.3)
SODIUM: 137 meq/L (ref 135–145)

## 2014-11-04 ENCOUNTER — Ambulatory Visit (HOSPITAL_COMMUNITY)
Admission: RE | Admit: 2014-11-04 | Discharge: 2014-11-04 | Disposition: A | Discharge status: Home / Self Care | Payer: Commercial Managed Care - HMO | Source: Ambulatory Visit | Attending: Cardiovascular Disease | Admitting: Cardiovascular Disease

## 2014-11-04 DIAGNOSIS — I739 Peripheral vascular disease, unspecified: Secondary | ICD-10-CM

## 2014-11-04 DIAGNOSIS — I10 Essential (primary) hypertension: Secondary | ICD-10-CM

## 2014-11-04 DIAGNOSIS — I6523 Occlusion and stenosis of bilateral carotid arteries: Secondary | ICD-10-CM | POA: Insufficient documentation

## 2014-11-17 ENCOUNTER — Encounter: Payer: Self-pay | Admitting: Nurse Practitioner

## 2014-11-18 NOTE — Telephone Encounter (Signed)
Pt advised and voiced understanding, will call back after a week to advise if cough has improved or not. Also advised to keep check of her BP and if syst is >160 and if dyst is >100 she should call our office. Pt advised and voiced understanding.

## 2014-11-18 NOTE — Telephone Encounter (Signed)
Advise pt to stop lisinopril and see if cough goes away---this is very likely the cause. If cough resolves after stopping lisinopril, have him RESTART the lisinopril and see if the same cough comes back.  If so, then he will have to stay of lisinopril and switch to different bp med.  Tell him to let us know what happens.  Doesn't sound like he needs to come in at this point.-thx

## 2014-11-19 ENCOUNTER — Encounter: Payer: Self-pay | Admitting: Cardiology

## 2014-11-19 ENCOUNTER — Telehealth: Payer: Self-pay | Admitting: *Deleted

## 2014-11-19 MED ORDER — LOSARTAN POTASSIUM 50 MG PO TABS: 50.0000 mg | ORAL_TABLET | Freq: Every day | ORAL | Status: DC

## 2014-11-19 NOTE — Telephone Encounter (Signed)
have been taking the Lisinopril as directed for the past  three weeks or so and have developed a very irritating  cough.   I can't tell  if it's from the medication or sinus drainage.  I have been taking sinus medicine but it has not relieved the cough. Just wondering what to do.  Will forward for dr Stanford Breed review

## 2014-11-19 NOTE — Telephone Encounter (Signed)
Spoke with pt, Aware of dr crenshaw's recommendations. New script sent to the pharmacy  

## 2014-11-19 NOTE — Telephone Encounter (Signed)
Dc lisinopril, cozaar 50 mg daily Kirk Ruths

## 2014-12-15 ENCOUNTER — Telehealth: Payer: Self-pay | Admitting: *Deleted

## 2014-12-15 DIAGNOSIS — J479 Bronchiectasis, uncomplicated: Secondary | ICD-10-CM

## 2014-12-15 NOTE — Telephone Encounter (Signed)
Pam with [facility] on 12/15/14 at 2:17pm stating that pt told her that she has two inhalers but is not using them because she doesn't feel like she is using the right. Pam stated that pt requested a spacer to use with inhalers. Pam stated that pt would like this order to be sent to [facility] because they deliver. She also requested that instructions be sent with it. Pam stated that she advised pt to bring the spacer and inhalers with her to her next ov as well. Please advise. Thanks.

## 2014-12-16 MED ORDER — BREATHERITE COLL SPACER ADULT MISC: Status: DC

## 2014-12-16 NOTE — Telephone Encounter (Signed)
Received request via Pam at [facility] about patients inhalers. I am not the provider that prescribed these for this patient (Prior PCP has left and I am the new PCP). Patient has not been evaluated by this provider, so all information I have is what is in the patient chart. -  Patient also has a pulmonologist that she follows with yearly.  - It appears she is NOT to take any inhalers daily, and only use inhalers when having an acute exacerbation/infection by pulmonology notes. She can get a copy of her medication instructions from her pharmacy, they can also review proper technique (as can her pulmonologist office). - She should also be encouraged to make an appointment for follow up with [place] (6 months f/u was requested at last visit) and to meet new provider. If she still has any questions on the use of her inhalers then I can show her as well.  Thanks.

## 2014-12-16 NOTE — Telephone Encounter (Signed)
Patient is aware that spacer order was placed.   That's all she wanted.  Also, notified patient that pharmacy should be able to help her with instructions and proper usage of spacer and inhalers.  She has appointment already scheduled at the end of September.

## 2015-01-08 ENCOUNTER — Encounter: Payer: Self-pay | Admitting: Family Medicine

## 2015-01-08 ENCOUNTER — Ambulatory Visit (INDEPENDENT_AMBULATORY_CARE_PROVIDER_SITE_OTHER): Payer: Commercial Managed Care - HMO | Admitting: Family Medicine

## 2015-01-08 ENCOUNTER — Telehealth: Payer: Self-pay | Admitting: Family Medicine

## 2015-01-08 VITALS — BP 179/80 | HR 54 | Temp 98.4°F | Resp 18 | Ht 62.0 in | Wt 153.0 lb

## 2015-01-08 DIAGNOSIS — E785 Hyperlipidemia, unspecified: Secondary | ICD-10-CM

## 2015-01-08 DIAGNOSIS — R7303 Prediabetes: Secondary | ICD-10-CM

## 2015-01-08 DIAGNOSIS — E039 Hypothyroidism, unspecified: Secondary | ICD-10-CM | POA: Diagnosis not present

## 2015-01-08 DIAGNOSIS — Z23 Encounter for immunization: Secondary | ICD-10-CM

## 2015-01-08 DIAGNOSIS — Z0000 Encounter for general adult medical examination without abnormal findings: Secondary | ICD-10-CM | POA: Insufficient documentation

## 2015-01-08 DIAGNOSIS — R7309 Other abnormal glucose: Secondary | ICD-10-CM | POA: Diagnosis not present

## 2015-01-08 DIAGNOSIS — J438 Other emphysema: Secondary | ICD-10-CM

## 2015-01-08 DIAGNOSIS — J439 Emphysema, unspecified: Secondary | ICD-10-CM | POA: Insufficient documentation

## 2015-01-08 LAB — POCT GLYCOSYLATED HEMOGLOBIN (HGB A1C): HEMOGLOBIN A1C: 5.5

## 2015-01-08 MED ORDER — LEVOTHYROXINE SODIUM 75 MCG PO TABS: 75.0000 ug | ORAL_TABLET | Freq: Every day | ORAL | Status: DC

## 2015-01-08 MED ORDER — OMEPRAZOLE 10 MG PO CPDR: 10.0000 mg | DELAYED_RELEASE_CAPSULE | Freq: Every day | ORAL | Status: DC

## 2015-01-08 MED ORDER — SIMVASTATIN 10 MG PO TABS: 10.0000 mg | ORAL_TABLET | Freq: Every evening | ORAL | Status: DC

## 2015-01-08 NOTE — Progress Notes (Signed)
Pre visit review using our clinic review tool, if applicable. No additional management support is needed unless otherwise documented below in the visit note. 

## 2015-01-08 NOTE — Progress Notes (Signed)
Subjective:    [name], female    DOB: 1933/11/18, 81 y.o.   MRN: [id number]  HPI Patient presents for scheduled visit on multiple chronic conditions.  Prediabetes:  Patient is diet controlled currently not on any medications last A1c was 5.9. She denies any numbness or tingling in her extremities. She denies any nonhealing wounds. She denies any hypo-or hyperglycemic events. She does not check her sugars at home. She does not exercise daily.  Hypertension: patient's blood pressure is above goal today. She states she has not taken her medications yet this morning. She does report compliance with her losartan and atenolol. She his followed by Dr. Stanford Breed /cardiology. She states her blood pressure medicine was recently changed to losartan from the lisinopril secondary to cough. She states she still has a mild cough.  Emphysema:  Patient follows with pulmonology for her emphysema. She has questions today concerning her inhalers, and states that she does not take either of her inhalers daily.  She brings with her today both Advair and albuterol inhalers. She states she rarely takes the albuterol, and takes the Advair a few times a week as needed. She feels that she doesn't get the inhalers in her throat, and feels that all lands on her tongue. She states she has been called in a spacer to her pharmacy, but has not picked up yet.  Patient has been complaining of a chronic mild cough that was originally thought to be due to lisinopril,  Recently switched to losartan with continued cough.    Hypothyroid: patient follows with endocrinology for her hypothyroid and enlarged thyroid. Patient's TSH and thyroid studies have been normal. Patient continue Synthroid 75 g a day. She reports compliance with her medications , and denies any side effects.  Health maintenance:  Colonoscopy: UTD Mammogram:UTD Cervical cancer screening:UTD, Not indicated Immunizations: Flu shot needed, UTD  tetanus, zostavax and PNA Infectious disease screening: Completed Dexa: UTD  Review of Systems Negative, with the exception of above mentioned in HPI      Objective:   Physical Exam BP 179/80 mmHg  Pulse 54  Temp(Src) 98.4 F (36.9 C) (Temporal)  Resp 18  Ht 5\' 2"  (1.575 m)  Wt 153 lb (69.4 kg)  BMI 27.98 kg/m2  SpO2 94% Gen: Afebrile. No acute distress.  Nontoxic in appearance, well-developed, well-nourished Caucasian female , very pleasant HENT: AT. [place]. Bilateral TM visualized and normal in appearance. MMM. Bilateral nares  Without erythema or swelling. Throat without erythema or exudates.  Eyes:Pupils Equal Round Reactive to light, Extraocular movements intact,  Conjunctiva without redness, discharge or icterus. Neck/lymp/endocrine: Supple, no lymphadenopathy,  Thyroid fullness present  CV: RRR  No murmur appreciated,  No edema, +2/4 P posterior tibialis pulses Chest: CTAB, no wheeze or crackles Abd: Soft.  round. NTND. BS  present.  no Masses palpated.     Assessment & Plan:  Dyslipidemia -   As the lipids at next appointment, will continue Zocor 10 mg tablet. Refills called in today. - simvastatin (ZOCOR) 10 MG tablet; Take 1 tablet (10 mg total) by mouth every evening.  Dispense: 90 tablet; Refill: 2  Hypothyroidism, unspecified hypothyroidism type -  Thyroid gland remains enlarged on exam today, patient to continue to follow-up with endocrine. She states she has an appointment within the next few weeks. Refills on medications given today. - levothyroxine (SYNTHROID, LEVOTHROID) 75 MCG tablet; Take 1 tablet (75 mcg total) by mouth daily before breakfast.  Dispense: 90 tablet; Refill: 2  Prediabetes -  A1c collected today, patient will be called with results, we'll follow-up every 6 months unless  Need  for medication. -  Patient counseled on healthy food choices - POCT HgB A1C  Other emphysema -  Discussed with patient that she needs to take the Advair 2 times a day, 2  puffs daily. -  Albuterol as needed only -  Patient to pickup spacer for her inhaler -  Continue to follow with pulmonology as indicated.  Health maintenance:  Colonoscopy: UTD Mammogram:UTD Cervical cancer screening:UTD, Not indicated Immunizations: Flu shot completed today, UTD tetanus, zostavax and PNA Infectious disease screening: Completed Dexa: UTD   follow-up in 6 months.

## 2015-01-08 NOTE — Patient Instructions (Signed)
Take your advair (purple one) daily. Use your albuterol inhaler only as needed. Make sure to pick up the spacer for your inhalers.  I will call you with results to a1c. Follow up in 6 months It appears you are up to date with all your health maintenance.  Call Dr. Jacalyn Lefevre office to get refills on blood pressure medicines and talk about chronic cough if inhalers do not improve.

## 2015-01-08 NOTE — Telephone Encounter (Signed)
Please call Amy Rodriguez, Her a1c was great at 5.5. She can follow up in 6 months, unless needed sooner.

## 2015-01-11 NOTE — Telephone Encounter (Signed)
Patient aware of results.  She will call to schedule closer to 6 months.

## 2015-01-25 ENCOUNTER — Other Ambulatory Visit: Payer: Self-pay | Admitting: *Deleted

## 2015-01-25 MED ORDER — ATENOLOL 25 MG PO TABS: 12.5000 mg | ORAL_TABLET | Freq: Two times a day (BID) | ORAL | Status: DC

## 2015-02-04 ENCOUNTER — Encounter: Payer: Self-pay | Admitting: Internal Medicine

## 2015-02-04 ENCOUNTER — Ambulatory Visit (INDEPENDENT_AMBULATORY_CARE_PROVIDER_SITE_OTHER): Payer: Commercial Managed Care - HMO | Admitting: Internal Medicine

## 2015-02-04 VITALS — BP 120/78 | HR 51 | Temp 97.5°F | Resp 12 | Wt 155.0 lb

## 2015-02-04 DIAGNOSIS — E040 Nontoxic diffuse goiter: Secondary | ICD-10-CM | POA: Diagnosis not present

## 2015-02-04 DIAGNOSIS — E038 Other specified hypothyroidism: Secondary | ICD-10-CM | POA: Diagnosis not present

## 2015-02-04 DIAGNOSIS — E063 Autoimmune thyroiditis: Secondary | ICD-10-CM

## 2015-02-04 NOTE — Patient Instructions (Signed)
Please continue Levothyroxine 75 mcg daily.  Take the thyroid hormone every day, with water, >30 minutes before breakfast, separated by >4 hours from acid reflux medications, calcium, iron, multivitamins.  Please return in 9 months (July 2017).

## 2015-02-04 NOTE — Progress Notes (Signed)
[name], female   DOB: 1934-03-23, 81 y.o.   MRN: [id number]   HPI  Amy Rodriguez is a 80 y.o.-year-old female, returning for f/u for hypothyroidism 2/2 to Hashimoto's thyroiditis and R goiter.  Pt. has been dx with hypothyroidism in ~2011; is on Levothyroxine 75 mcg (increased 07/2013), taken: - fasting - with water - separated by >30-60 min from b'fast  - no calcium, iron, multivitamins  - + PPIs as needed- later in the day  I reviewed pt's thyroid tests: Lab Results  Component Value Date   TSH 2.47 10/20/2014   TSH 1.63 07/08/2014   TSH 1.15 12/19/2013   TSH 0.78 09/19/2013   TSH 8.120* 07/26/2013   TSH 3.56 12/17/2012   TSH 2.374 03/18/2012   TSH 15.257* 02/11/2012   TSH 0.906 12/05/2011   TSH 2.301 09/20/2010   FREET4 1.03 07/08/2014   FREET4 1.39 03/18/2012    Component     Latest Ref Rng 07/08/2014  Thyroperoxidase Ab SerPl-aCnc     <9 IU/mL >900 (H)  This test was repeated by PCP in 10/2014 >> same result.  Pt describes: - + weight gain  - + fatigue - no cold intolerance, some hot flushes - no depression - no constipation - no dry skin - + hair loss  Enlarged right thyroid lobe - initially felt on on palpation Thyroid ultrasound (07/09/2014): enlarged right lobe, with hypoechoic aspect and moth-eaten appearance suggestive of Hashimoto's thyroiditis, and a normal left lobe. No nodules.  Pt was feeling fullness R neck >> now better, no hoarseness, no dysphagia/no odynophagia, she was feeling choking >> now also better, no SOB with lying down.  I reviewed her chart and she also has a history of HL, HTN, GERD, vit D def., bronchiectasis, TIA (small aneurysm - being followed by Dr Leonie Man.  ROS: Constitutional: + see HPI, + excessive urination Eyes: no blurry vision, no xerophthalmia ENT: no sore throat, no nodules palpated in throat, no dysphagia/odynophagia, no hoarseness, + tinnitus Cardiovascular: no CP/SOB/palpitations/leg  swelling Respiratory: + cough/no SOB Gastrointestinal: no N/V/D/C/reflux Musculoskeletal: no muscle/+ joint aches (OA), had L TKR 2013 Skin: no rashes, + hair loss Neurological: no tremors/numbness/tingling/dizziness  I reviewed pt's medications, allergies, PMH, social hx, family hx, and changes were documented in the history of present illness. Otherwise, unchanged from my initial visit note.  Past Medical History  Diagnosis Date  . Supraventricular tachycardia, paroxysmal ([place])   . HTN (hypertension)   . Hyperlipidemia   . Postmenopausal HRT (hormone replacement therapy)   . Other seborrheic keratosis   . Vaginal pruritus   . Urinary frequency   . Diverticulosis of colon   . Blood in stool   . Pain in joint, lower leg   . Osteopenia   . IBS (irritable bowel syndrome)   . Hypothyroidism     TSH 14.488 (11/2005)  . H pylori ulcer     not treated due to expense  10/2001  . PPD positive     history +PPD 1984, no treatment, no abnormal CXR  . Posterior vitreous detachment 1996  . Multiple pigmented nevi     last derm evaluation 01/2003  . Postmenopausal     s/p hysterectomy for h/o cervical cancer 1982 (both ovaries taken at that time) now on hormonal replacement   . Shortness of breath     with ambulation  . Environmental allergies   . Lactose intolerance   . GERD (gastroesophageal reflux disease)   . Hand dermatitis   .  Cervical cancer ([place])     s/p hysterectomy/oop  . PONV (postoperative nausea and vomiting)     "and takes me a long time to come out under it" (02/08/2012)  . Pneumonia     "couple times in my lifetime" (02/08/2012)  . History of bronchitis     "w/a cold" (02/08/2012)  . Migraines     "get them very rarely now" (02/08/2012)  . OA (osteoarthritis)     multiple sites  . Arthritis     sed rate 10, RF, CCP pending  . DDD (degenerative disc disease), lumbosacral   . Osteoporosis     "borderline" (02/08/2012)  . Depression     Due to husbands passing  away 09/22/2011   Past Surgical History  Procedure Laterality Date  . Eye surgery      cataract removal right eye  . Colonoscopy    . Total abdominal hysterectomy w/ bilateral salpingoophorectomy  1982  . Cataract extraction w/ intraocular lens implant  ?1997    right  . Total knee arthroplasty  02/08/2012    Procedure: TOTAL KNEE ARTHROPLASTY;  Surgeon: Hessie Dibble, MD;  Location: [facility];  Service: Orthopedics;  Laterality: Left;  . Cardiovascular stress test  12/07/11    Normal nuclear stress test  . Abdominal hysterectomy  1982   Social History   Social History  . Marital Status: Married    Spouse Name: N/A  . Number of Children: 3  . Years of Education: 12TH   Occupational History  . retired     Scientist, research (medical)  .     Social History Main Topics  . Smoking status: Former Smoker -- 1.00 packs/day for 25 years    Types: Cigarettes    Quit date: 04/17/1980  . Smokeless tobacco: Former Systems developer    Quit date: 03/16/1984     Comment: passive smoker, husband smoked  . Alcohol Use: No  . Drug Use: No  . Sexual Activity: No   Other Topics Concern  . Not on file   Social History Narrative   Lives with son, [name] who has medical problems-they help each other. Her husband died 2022/10/14 after 67 years of marriage.  She has 2 other sons that live within 2 hrs from her. She does not drive-she uses [facility]. She used to work in Scientist, research (medical), retired.     Tobacco: 25 pack yr hx, quit 1985.   Current Outpatient Prescriptions on File Prior to Visit  Medication Sig Dispense Refill  . albuterol (PROVENTIL HFA;VENTOLIN HFA) 108 (90 BASE) MCG/ACT inhaler Inhale 2 puffs into the lungs every 6 (six) hours as needed for wheezing. 1 Inhaler 3  . aspirin EC 81 MG tablet Take 81 mg by mouth daily.    Marland Kitchen atenolol (TENORMIN) 25 MG tablet Take 0.5 tablets (12.5 mg total) by mouth 2 (two) times daily. 30 tablet 9  . Cyanocobalamin (VITAMIN B12 PO) Take 15 mLs by mouth daily.     . fexofenadine (ALLEGRA)  180 MG tablet Take 1 tablet (180 mg total) by mouth daily as needed. Allergies 90 tablet 3  . fluticasone-salmeterol (ADVAIR HFA) 230-21 MCG/ACT inhaler Inhale 2 puffs into the lungs 2 (two) times daily. 1 Inhaler 6  . levothyroxine (SYNTHROID, LEVOTHROID) 75 MCG tablet Take 1 tablet (75 mcg total) by mouth daily before breakfast. 90 tablet 2  . losartan (COZAAR) 50 MG tablet Take 1 tablet (50 mg total) by mouth daily. 90 tablet 3  . omeprazole (PRILOSEC) 10 MG capsule  Take 1 capsule (10 mg total) by mouth daily. 90 capsule 2  . simvastatin (ZOCOR) 10 MG tablet Take 1 tablet (10 mg total) by mouth every evening. 90 tablet 2  . Spacer/Aero-Holding Chambers (BREATHERITE COLL SPACER ADULT) MISC Use with  Albuterol Inhaler. 1 each 0  . VOLTAREN 1 % GEL   2   No current facility-administered medications on file prior to visit.   Allergies  Allergen Reactions  . Benzonatate Nausea And Vomiting    "tessalon pearl"  . Sulfonamide Derivatives Itching and Rash  . Amlodipine Other (See Comments)    Heart palpitations    Family History  Problem Relation Age of Onset  . Breast cancer Mother 12  . Lung cancer Brother 50  . Cancer Brother     lung  . Heart failure Father     congestive  . Heart disease Father   . Epilepsy Son   . Kidney disease Son     tuberous sclerosis, both kidneys removed, has transplant  . Diabetes Maternal Uncle   . Diabetes Paternal Aunt   . Cancer Maternal Grandmother     breast  . Heart disease Son     [place]  . COPD Son    PE: BP 120/78 mmHg  Pulse 51  Temp(Src) 97.5 F (36.4 C) (Oral)  Resp 12  Wt 155 lb (70.308 kg)  SpO2 96% Body mass index is 28.34 kg/(m^2). Wt Readings from Last 3 Encounters:  02/04/15 155 lb (70.308 kg)  01/08/15 153 lb (69.4 kg)  10/23/14 150 lb (68.04 kg)   Constitutional: normal weight, in NAD Eyes: PERRLA, EOMI, no exophthalmos ENT: moist mucous membranes, + R thyromegaly, no cervical  lymphadenopathy Cardiovascular: bradycardia, RR, No MRG Respiratory: CTA B Gastrointestinal: abdomen soft, NT, ND, BS+ Musculoskeletal: + deformities in fingers ([place] nodules), strength intact in all 4 Skin: moist, warm, no rashes Neurological: no tremor with outstretched hands, DTR normal in all 4  ASSESSMENT: 1. Hypothyroidism  2. Goiter - Thyroid U/S (07/09/2014):   Right thyroid lobe: 7.6 x 3.3 x 3.6 cm. The right thyroid lobe is diffusely enlarged and hypoechoic. Right thyroid tissue is mildly lobular but there is not a focal nodule. (honeycomb appearance)   Left thyroid lobe: 5.1 x 2.2 x 2.2 cm. Left thyroid tissue is heterogeneous but more hyperechoic than the right thyroid lobe and isthmus. There is not a focal nodule. Left thyroid tissue is very vascular.   Isthmus Thickness: 0.8 cm. Isthmus is prominent for size and has a similar hypoechoic appearance as the right thyroid lobe.   Lymphadenopathy None visualized.    PLAN:  1. Patient with several years of Hashimoto's hypothyroidism, on levothyroxine therapy. She appears euthyroid.  - we reviewed her recent thyroid test together - they were normal 3 mo ago. - No need to repeat the thyroid tests today >> will repeat them in 10/2015 (1 year from the previous check) - We discussed about correct intake of levothyroxine, fasting, with water, separated by at least 30 minutes from breakfast, and separated by more than 4 hours from calcium, iron, multivitamins, acid reflux medications (PPIs). She is taking it correctly  2. Right thyroid lobe enlargement - I reviewed the images of her recent thyroid ultrasound along with the patient. Patient has a large thyroid lobe shown on the ultrasound from 07/09/2014. There are no focal nodules, and the consistency is moth-eaten, consistent with Hashimoto thyroiditis. The right lobe also contains septations and a hypoechoic appearance - I explained that,  due to the high titer of  her TPO antibodies, this can cause various amounts of inflammation in her thyroid, with subsequent enlargement. The Hashimoto's thyroid will usually fluctuate in size, with decreases when her antibodies are lower.  - she feels that the size of her thyroid has decreased in size and she has no dysphagia Return in about 9 months (around 11/04/2015).

## 2015-02-26 ENCOUNTER — Ambulatory Visit: Payer: Commercial Managed Care - HMO | Admitting: Family Medicine

## 2015-03-01 ENCOUNTER — Encounter: Payer: Self-pay | Admitting: Family Medicine

## 2015-03-01 ENCOUNTER — Ambulatory Visit (INDEPENDENT_AMBULATORY_CARE_PROVIDER_SITE_OTHER): Payer: Commercial Managed Care - HMO | Admitting: Family Medicine

## 2015-03-01 VITALS — BP 169/77 | HR 50 | Temp 97.8°F | Resp 20 | Ht 62.0 in | Wt 157.0 lb

## 2015-03-01 DIAGNOSIS — J0100 Acute maxillary sinusitis, unspecified: Secondary | ICD-10-CM | POA: Insufficient documentation

## 2015-03-01 MED ORDER — AMOXICILLIN-POT CLAVULANATE 875-125 MG PO TABS: 1.0000 | ORAL_TABLET | Freq: Two times a day (BID) | ORAL | Status: DC

## 2015-03-01 MED ORDER — FLUTICASONE PROPIONATE 50 MCG/ACT NA SUSP: 2.0000 | Freq: Every day | NASAL | Status: DC

## 2015-03-01 NOTE — Progress Notes (Signed)
Subjective:    [name], female    DOB: [birth year], 81 y.o.   MRN: [id number]  HPI  Right Ear pain: Patient presents for acute office visit with complaints of right ear pain for approximately 10 days. She states she woke up 10 days ago with a "roar " in her right ear. She states she has felt mildly dizzy and on a few occasions during this course. She denies any falls. She endorses sinus headache, right-sided facial pressure, right-sided ear discomfort and rhinorrhea/congestion. She states she is eating and drinking well, no fevers or chills. She has restarted her Allegra, and has noticed some improvement in the hearing and dizziness.  Patient endorses chronic mild tinnitus. She has had a hearing screen many years ago when tinnitus started, she states it was normal.  Past Medical History  Diagnosis Date  . Supraventricular tachycardia, paroxysmal ([place])   . HTN (hypertension)   . Hyperlipidemia   . Postmenopausal HRT (hormone replacement therapy)   . Other seborrheic keratosis   . Vaginal pruritus   . Urinary frequency   . Diverticulosis of colon   . Blood in stool   . Pain in joint, lower leg   . Osteopenia   . IBS (irritable bowel syndrome)   . Hypothyroidism     TSH 14.488 (11/2005)  . H pylori ulcer     not treated due to expense  10/2001  . PPD positive     history +PPD 1984, no treatment, no abnormal CXR  . Posterior vitreous detachment 1996  . Multiple pigmented nevi     last derm evaluation 01/2003  . Postmenopausal     s/p hysterectomy for h/o cervical cancer 1982 (both ovaries taken at that time) now on hormonal replacement   . Shortness of breath     with ambulation  . Environmental allergies   . Lactose intolerance   . GERD (gastroesophageal reflux disease)   . Hand dermatitis   . Cervical cancer ([place])     s/p hysterectomy/oop  . PONV (postoperative nausea and vomiting)     "and takes me a long time to come out under it" (02/08/2012)  . Pneumonia      "couple times in my lifetime" (02/08/2012)  . History of bronchitis     "w/a cold" (02/08/2012)  . Migraines     "get them very rarely now" (02/08/2012)  . OA (osteoarthritis)     multiple sites  . Arthritis     sed rate 10, RF, CCP pending  . DDD (degenerative disc disease), lumbosacral   . Osteoporosis     "borderline" (02/08/2012)  . Depression     Due to husbands passing away 09/22/2011   Allergies  Allergen Reactions  . Benzonatate Nausea And Vomiting    "tessalon pearl"  . Sulfonamide Derivatives Itching and Rash  . Amlodipine Other (See Comments)    Heart palpitations     Review of Systems Negative, with the exception of above mentioned in HPI     Objective:   Physical Exam BP 169/77 mmHg  Pulse 50  Temp(Src) 97.8 F (36.6 C) (Oral)  Resp 20  Ht 5\' 2"  (1.575 m)  Wt 157 lb (71.215 kg)  BMI 28.71 kg/m2  SpO2 94% Gen: Afebrile. No acute distress. Nontoxic in appearance. Well developed, well nourished, pleasant.  HENT: AT. [place]. Bilateral TM visualized full/shiny in appearance, no erythema or bulging.  MMM. Bilateral nares with erythema and swelling R>L.  Throat without erythema or  exudates. TTP right max sinus. No cough or hoarseness on exam.  Eyes:Pupils Equal Round Reactive to light, Extraocular movements intact,  Conjunctiva without redness, discharge or icterus. Neck/lymp/endocrine: Supple, mild anterior cervical lymphadenopathy CV: RRR  Chest: CTAB, no wheeze or crackles Skin: No rashes, purpura or petechiae.  Neuro: Normal gait. PERLA. EOMi. Alert. Oriented x3     Assessment & Plan:  1. Acute maxillary sinusitis, recurrence not specified - Rest, hydrate, abx, flonase, mucinex, nasal saline if needed.  - If no improvement on dizziness or hearing within 2 weeks, will need to be re-evaluated and consider ENT/audiology after in office hearing screen.  - amoxicillin-clavulanate (AUGMENTIN) 875-125 MG tablet; Take 1 tablet by mouth 2 (two) times daily.   Dispense: 20 tablet; Refill: 0 - fluticasone (FLONASE) 50 MCG/ACT nasal spray; Place 2 sprays into both nostrils daily.  Dispense: 16 g; Refill: 6

## 2015-03-01 NOTE — Patient Instructions (Signed)

## 2015-04-07 ENCOUNTER — Telehealth: Payer: Self-pay | Admitting: Cardiology

## 2015-04-07 NOTE — Telephone Encounter (Signed)
Increase losartan to 100 mg once daily, have her come see me for BP evaluation in about 2 weeks

## 2015-04-07 NOTE — Telephone Encounter (Signed)
Pt advised to increase medication, continue home BP checks, arranged visit w/ Erasmo Downer on 04/22/15. Pt aware to call if new problems.

## 2015-04-07 NOTE — Telephone Encounter (Signed)
Please call,question about her blood pressure medicine. °

## 2015-04-07 NOTE — Telephone Encounter (Signed)
Returned call to patient. She states her BPs have been up for the past several days, noted lowest reading [id number] systolic, typically between 160 & 170 / 80-90 HRs in 60s. She takes losartan 50mg  daily - wanted to know if OK to increase dose. Informed pt should be OK to increase but would double check w/ pharmD for best advice. Pt aware.

## 2015-04-22 ENCOUNTER — Ambulatory Visit (INDEPENDENT_AMBULATORY_CARE_PROVIDER_SITE_OTHER): Payer: Medicare HMO | Admitting: Pharmacist Clinician (PhC)/ Clinical Pharmacy Specialist

## 2015-04-22 VITALS — BP 152/74 | HR 51 | Ht 62.0 in | Wt 156.6 lb

## 2015-04-22 DIAGNOSIS — I10 Essential (primary) hypertension: Secondary | ICD-10-CM | POA: Diagnosis not present

## 2015-04-22 NOTE — Patient Instructions (Signed)
Your blood pressure today is 152/74  (goal is <150/90)  Check your blood pressure at home daily and keep record of the readings.  Take your BP meds as follows: continue with losartan, take atenolol only if HR is >60.    Bring all of your meds, your BP cuff and your record of home blood pressures to your next appointment.  Exercise as you're able, try to walk approximately 30 minutes per day.  Keep salt intake to a minimum, especially watch canned and prepared boxed foods.  Eat more fresh fruits and vegetables and fewer canned items.  Avoid eating in fast food restaurants.    HOW TO TAKE YOUR BLOOD PRESSURE: . Rest 5 minutes before taking your blood pressure. .  Don't smoke or drink caffeinated beverages for at least 30 minutes before. . Take your blood pressure before (not after) you eat. . Sit comfortably with your back supported and both feet on the floor (don't cross your legs). . Elevate your arm to heart level on a table or a desk. . Use the proper sized cuff. It should fit smoothly and snugly around your bare upper arm. There should be enough room to slip a fingertip under the cuff. The bottom edge of the cuff should be 1 inch above the crease of the elbow. . Ideally, take 3 measurements at one sitting and record the average.

## 2015-04-23 ENCOUNTER — Encounter: Payer: Self-pay | Admitting: Pharmacist Clinician (PhC)/ Clinical Pharmacy Specialist

## 2015-04-23 NOTE — Progress Notes (Signed)
04/23/2015 BLAYZE KOBER 1933-11-18 [id number]   HPI:  Amy Rodriguez is a 82 y.o. female patient of Dr Stanford Breed, with a PMH below who presents today for hypertension clinic evaluation.    Cardiac Hx: hypertension, hyperlipidemia,   Family Hx: father died of heart failure at age 76, 1 son post kidney transplant with hypertension  Social Hx: does not drink alcohol or use tobacco (quit tobacco 30+ years ago); only drinks decaffeinated coffee  Diet: sodium levels trend low, was told not to limit sodium intake - she says she just doesn't worry about it  Exercise: unable to do much due to bad knees  Home BP readings: brough in log with 15 readings over past 3 weeks - 3 of those [id number] systolic, with the highest at 166, lowest was 126; average was 140/77  Current antihypertensive medications: losartan 50 mg bid, atenolol 12.5 mg bid (holds for HR <60)  Intolerances: amlodipine caused palpitations  Wt Readings from Last 3 Encounters:  04/23/15 156 lb 9.6 oz (71.033 kg)  03/01/15 157 lb (71.215 kg)  02/04/15 155 lb (70.308 kg)   BP Readings from Last 3 Encounters:  04/23/15 152/74  03/01/15 169/77  02/04/15 120/78   Pulse Readings from Last 3 Encounters:  04/23/15 51  03/01/15 50  02/04/15 51    Current Outpatient Prescriptions  Medication Sig Dispense Refill  . albuterol (PROVENTIL HFA;VENTOLIN HFA) 108 (90 BASE) MCG/ACT inhaler Inhale 2 puffs into the lungs every 6 (six) hours as needed for wheezing. 1 Inhaler 3  . amoxicillin-clavulanate (AUGMENTIN) 875-125 MG tablet Take 1 tablet by mouth 2 (two) times daily. 20 tablet 0  . aspirin EC 81 MG tablet Take 81 mg by mouth daily.    Marland Kitchen atenolol (TENORMIN) 25 MG tablet Take 0.5 tablets (12.5 mg total) by mouth 2 (two) times daily. 30 tablet 9  . Cyanocobalamin (VITAMIN B12 PO) Take 15 mLs by mouth daily.     . fexofenadine (ALLEGRA) 180 MG tablet Take 1 tablet (180 mg total) by mouth daily as needed. Allergies 90 tablet 3  .  fluticasone (FLONASE) 50 MCG/ACT nasal spray Place 2 sprays into both nostrils daily. 16 g 6  . fluticasone-salmeterol (ADVAIR HFA) 230-21 MCG/ACT inhaler Inhale 2 puffs into the lungs 2 (two) times daily. 1 Inhaler 6  . levothyroxine (SYNTHROID, LEVOTHROID) 75 MCG tablet Take 1 tablet (75 mcg total) by mouth daily before breakfast. 90 tablet 2  . losartan (COZAAR) 50 MG tablet Take 1 tablet (50 mg total) by mouth daily. 90 tablet 3  . omeprazole (PRILOSEC) 10 MG capsule Take 1 capsule (10 mg total) by mouth daily. 90 capsule 2  . simvastatin (ZOCOR) 10 MG tablet Take 1 tablet (10 mg total) by mouth every evening. 90 tablet 2  . Spacer/Aero-Holding Chambers (BREATHERITE COLL SPACER ADULT) MISC Use with  Albuterol Inhaler. 1 each 0  . VOLTAREN 1 % GEL   2   No current facility-administered medications for this visit.    Allergies  Allergen Reactions  . Benzonatate Nausea And Vomiting    "tessalon pearl"  . Sulfonamide Derivatives Itching and Rash  . Amlodipine Other (See Comments)    Heart palpitations     Past Medical History  Diagnosis Date  . Supraventricular tachycardia, paroxysmal ([place])   . HTN (hypertension)   . Hyperlipidemia   . Postmenopausal HRT (hormone replacement therapy)   . Other seborrheic keratosis   . Vaginal pruritus   . Urinary frequency   .  Diverticulosis of colon   . Blood in stool   . Pain in joint, lower leg   . Osteopenia   . IBS (irritable bowel syndrome)   . Hypothyroidism     TSH 14.488 (11/2005)  . H pylori ulcer     not treated due to expense  10/2001  . PPD positive     history +PPD 1984, no treatment, no abnormal CXR  . Posterior vitreous detachment 1996  . Multiple pigmented nevi     last derm evaluation 01/2003  . Postmenopausal     s/p hysterectomy for h/o cervical cancer 1982 (both ovaries taken at that time) now on hormonal replacement   . Shortness of breath     with ambulation  . Environmental allergies   . Lactose intolerance   .  GERD (gastroesophageal reflux disease)   . Hand dermatitis   . Cervical cancer ([place])     s/p hysterectomy/oop  . PONV (postoperative nausea and vomiting)     "and takes me a long time to come out under it" (02/08/2012)  . Pneumonia     "couple times in my lifetime" (02/08/2012)  . History of bronchitis     "w/a cold" (02/08/2012)  . Migraines     "get them very rarely now" (02/08/2012)  . OA (osteoarthritis)     multiple sites  . Arthritis     sed rate 10, RF, CCP pending  . DDD (degenerative disc disease), lumbosacral   . Osteoporosis     "borderline" (02/08/2012)  . Depression     Due to husbands passing away 09/22/2011    Blood pressure 152/74, pulse 51, height 5\' 2"  (1.575 m), weight 156 lb 9.6 oz (71.033 kg).    Tommy Medal PharmD CPP [facility]

## 2015-04-23 NOTE — Assessment & Plan Note (Signed)
Based on her home readings and her in office BP of 152/74, Amy Rodriguez seems to have good control of her BP. She notes that she only takes the amlodipine every couple of days, and I have told it is fine to continue with that routine.  She will continue with her losartan as well as regular home BP checks.  I have asked that she call should she notice her BP increase consistently to > Q000111Q systolic.

## 2015-04-30 ENCOUNTER — Ambulatory Visit (INDEPENDENT_AMBULATORY_CARE_PROVIDER_SITE_OTHER): Payer: Medicare HMO | Admitting: Emergency Medicine

## 2015-04-30 ENCOUNTER — Ambulatory Visit: Payer: Medicare HMO | Admitting: Pulmonary Disease

## 2015-04-30 ENCOUNTER — Encounter: Payer: Self-pay | Admitting: Emergency Medicine

## 2015-04-30 VITALS — BP 124/80 | HR 54 | Ht 62.0 in | Wt 157.0 lb

## 2015-04-30 DIAGNOSIS — J309 Allergic rhinitis, unspecified: Secondary | ICD-10-CM | POA: Diagnosis not present

## 2015-04-30 DIAGNOSIS — J479 Bronchiectasis, uncomplicated: Secondary | ICD-10-CM | POA: Diagnosis not present

## 2015-04-30 DIAGNOSIS — J438 Other emphysema: Secondary | ICD-10-CM | POA: Diagnosis not present

## 2015-04-30 DIAGNOSIS — J0100 Acute maxillary sinusitis, unspecified: Secondary | ICD-10-CM

## 2015-04-30 MED ORDER — FLUTICASONE PROPIONATE 50 MCG/ACT NA SUSP: 2.0000 | Freq: Every day | NASAL | Status: DC

## 2015-04-30 NOTE — Assessment & Plan Note (Signed)
No fever, no sputum, minimal cough

## 2015-04-30 NOTE — Assessment & Plan Note (Signed)
Without AFL on PFT. Minimal exertional limitation. Stop Advair. Continue albuterol prn

## 2015-04-30 NOTE — Assessment & Plan Note (Signed)
Discussed pre-emptively treating allergies w nasal steroid and allegra beginning Fall and beginning Spring.

## 2015-04-30 NOTE — Progress Notes (Signed)
Subjective:    [name], female    DOB: [birth year], 82 y.o.   MRN: [id number]  HPI 82 year old woman, hx of tobacco use (25 pk-yrs), history of hypertension, SVT and emphysema based on CT 04/2014. Her PFT and CT were personally reviewed by me. Showed some RML bronchiectasis as well as emphysema. PFT 2015 show normal airflows with some evidence of obstruction after BD administration.  She has been followed by Dr Gwenette Greet. She was being managed on Advair HFA + SABA prn. , and the Advair was stopped about a year ago. She still uses it prn. Doesn't use albuterol.   She has been dealing with a cough for the las several months, but it has cleared up in the last month, ? Allergies. Minimally productive of clear. Her BP med was changed in response to this. She is on allegra prn, fluticasone nasal spray prn.   Review of Systems  Constitutional: Negative for fever, activity change and unexpected weight change.  HENT: Positive for congestion, postnasal drip, rhinorrhea and sneezing.   Respiratory: Positive for shortness of breath. Negative for cough, chest tightness, wheezing and stridor.        SOB walking uphill, bending, significant exertion.   Cardiovascular: Negative for chest pain.     Past Medical History  Diagnosis Date  . Supraventricular tachycardia, paroxysmal ([place])   . HTN (hypertension)   . Hyperlipidemia   . Postmenopausal HRT (hormone replacement therapy)   . Other seborrheic keratosis   . Vaginal pruritus   . Urinary frequency   . Diverticulosis of colon   . Blood in stool   . Pain in joint, lower leg   . Osteopenia   . IBS (irritable bowel syndrome)   . Hypothyroidism     TSH 14.488 (11/2005)  . H pylori ulcer     not treated due to expense  10/2001  . PPD positive     history +PPD 1984, no treatment, no abnormal CXR  . Posterior vitreous detachment 1996  . Multiple pigmented nevi     last derm evaluation 01/2003  . Postmenopausal     s/p hysterectomy  for h/o cervical cancer 1982 (both ovaries taken at that time) now on hormonal replacement   . Shortness of breath     with ambulation  . Environmental allergies   . Lactose intolerance   . GERD (gastroesophageal reflux disease)   . Hand dermatitis   . Cervical cancer ([place])     s/p hysterectomy/oop  . PONV (postoperative nausea and vomiting)     "and takes me a long time to come out under it" (02/08/2012)  . Pneumonia     "couple times in my lifetime" (02/08/2012)  . History of bronchitis     "w/a cold" (02/08/2012)  . Migraines     "get them very rarely now" (02/08/2012)  . OA (osteoarthritis)     multiple sites  . Arthritis     sed rate 10, RF, CCP pending  . DDD (degenerative disc disease), lumbosacral   . Osteoporosis     "borderline" (02/08/2012)  . Depression     Due to husbands passing away 09/22/2011     Family History  Problem Relation Age of Onset  . Breast cancer Mother 27  . Lung cancer Brother 57  . Cancer Brother     lung  . Heart failure Father     congestive  . Heart disease Father   . Epilepsy Son   . Kidney  disease Son     tuberous sclerosis, both kidneys removed, has transplant  . Diabetes Maternal Uncle   . Diabetes Paternal Aunt   . Cancer Maternal Grandmother     breast  . Heart disease Son     [place] white  . COPD Son      Social History   Social History  . Marital Status: Married    Spouse Name: N/A  . Number of Children: 3  . Years of Education: 12TH   Occupational History  . retired     Scientist, research (medical)  .     Social History Main Topics  . Smoking status: Former Smoker -- 1.00 packs/day for 25 years    Types: Cigarettes    Quit date: 04/17/1980  . Smokeless tobacco: Former Systems developer    Quit date: 03/16/1984     Comment: passive smoker, husband smoked  . Alcohol Use: No  . Drug Use: No  . Sexual Activity: No   Other Topics Concern  . Not on file   Social History Narrative   Lives with son, [name] who has medical problems-they  help each other. Her husband died 10-21-2022 after 86 years of marriage.  She has 2 other sons that live within 2 hrs from her. She does not drive-she uses [facility]. She used to work in Scientist, research (medical), retired.     Tobacco: 25 pack yr hx, quit 1985.     Allergies  Allergen Reactions  . Benzonatate Nausea And Vomiting    "tessalon pearl"  . Sulfonamide Derivatives Itching and Rash  . Amlodipine Other (See Comments)    Heart palpitations      Outpatient Prescriptions Prior to Visit  Medication Sig Dispense Refill  . albuterol (PROVENTIL HFA;VENTOLIN HFA) 108 (90 BASE) MCG/ACT inhaler Inhale 2 puffs into the lungs every 6 (six) hours as needed for wheezing. 1 Inhaler 3  . aspirin EC 81 MG tablet Take 81 mg by mouth daily.    Marland Kitchen atenolol (TENORMIN) 25 MG tablet Take 0.5 tablets (12.5 mg total) by mouth 2 (two) times daily. 30 tablet 9  . Cyanocobalamin (VITAMIN B12 PO) Take 15 mLs by mouth daily.     . fexofenadine (ALLEGRA) 180 MG tablet Take 1 tablet (180 mg total) by mouth daily as needed. Allergies 90 tablet 3  . fluticasone (FLONASE) 50 MCG/ACT nasal spray Place 2 sprays into both nostrils daily. 16 g 6  . fluticasone-salmeterol (ADVAIR HFA) 230-21 MCG/ACT inhaler Inhale 2 puffs into the lungs 2 (two) times daily. 1 Inhaler 6  . levothyroxine (SYNTHROID, LEVOTHROID) 75 MCG tablet Take 1 tablet (75 mcg total) by mouth daily before breakfast. 90 tablet 2  . losartan (COZAAR) 50 MG tablet Take 1 tablet (50 mg total) by mouth daily. 90 tablet 3  . omeprazole (PRILOSEC) 10 MG capsule Take 1 capsule (10 mg total) by mouth daily. 90 capsule 2  . simvastatin (ZOCOR) 10 MG tablet Take 1 tablet (10 mg total) by mouth every evening. 90 tablet 2  . Spacer/Aero-Holding Chambers (BREATHERITE COLL SPACER ADULT) MISC Use with  Albuterol Inhaler. 1 each 0  . VOLTAREN 1 % GEL   2  . amoxicillin-clavulanate (AUGMENTIN) 875-125 MG tablet Take 1 tablet by mouth 2 (two) times daily. 20 tablet 0   No  facility-administered medications prior to visit.         Objective:   Physical Exam  Filed Vitals:   04/30/15 1327 04/30/15 1328  BP:  124/80  Pulse:  54  Height: 5\' 2"  (1.575 m)   Weight: 157 lb (71.215 kg)   SpO2:  97%  Gen: Pleasant, well-nourished, in no distress,  normal affect  ENT: No lesions,  mouth clear,  oropharynx clear, no postnasal drip  Neck: No JVD, no TMG, no carotid bruits  Lungs: No use of accessory muscles, clear without rales or rhonchi  Cardiovascular: brady but regular, heart sounds normal, no murmur or gallops, no peripheral edema  Musculoskeletal: No deformities, no cyanosis or clubbing  Neuro: alert, non focal  Skin: Warm, no lesions or rashes   CT scan 04/22/14 --  EXAM: CT CHEST WITHOUT CONTRAST  TECHNIQUE: Multidetector CT imaging of the chest was performed following the standard protocol without intravenous contrast. High resolution imaging of the lungs, as well as inspiratory and expiratory imaging, was performed.  COMPARISON: None.  FINDINGS: Right lobe of the thyroid is asymmetrically enlarged. Mediastinal lymph nodes are not enlarged by CT size criteria. Hilar regions are difficult to definitively evaluate without IV contrast. No axillary adenopathy. Ascending aorta measures 3.6 cm. Pulmonary arteries are enlarged. Three-vessel coronary artery calcification. Heart is at the upper limits of normal in size. No pericardial effusion.  Centrilobular emphysema appears moderate in severity. Mild peribronchovascular nodularity in the upper lobes, right greater than left, is likely postinfectious in etiology. Mild bronchiectasis is seen in association. No subpleural reticulation, traction bronchiectasis/ bronchiolectasis, ground-glass, architectural distortion or honeycombing. No air trapping. No pleural fluid. Airway is unremarkable.  Incidental imaging of the upper abdomen shows the visualized portions of the liver, adrenal  glands, kidneys, spleen and stomach to be grossly unremarkable.  No worrisome lytic or sclerotic lesions.  IMPRESSION: 1. Mild bronchiectasis is seen in association with probable post infectious scarring in the upper lobes, right greater than left. 2. Moderate centrilobular emphysema. No evidence of superimposed interstitial lung disease. 3. Ascending aorta is at the upper limits of normal in size.      Assessment & Plan:  Bronchiectasis without acute exacerbation No fever, no sputum, minimal cough  Other emphysema Without AFL on PFT. Minimal exertional limitation. Stop Advair. Continue albuterol prn   Allergic rhinitis Discussed pre-emptively treating allergies w nasal steroid and allegra beginning Fall and beginning Spring.

## 2015-04-30 NOTE — Assessment & Plan Note (Signed)
Resolved

## 2015-04-30 NOTE — Patient Instructions (Signed)
Please stop Advair Use albuterol 2 puffs if needed for shortness of breath Start your allegra and fluticasone nasal spray every day at the beginning of both the Spring and the Fall. Take these daily through the allergy seasons. Follow with Dr Lamonte Sakai in 12 months or sooner if you have any problems

## 2015-07-07 ENCOUNTER — Ambulatory Visit (INDEPENDENT_AMBULATORY_CARE_PROVIDER_SITE_OTHER): Payer: Medicare HMO | Admitting: Family Medicine

## 2015-07-07 ENCOUNTER — Encounter: Payer: Self-pay | Admitting: Family Medicine

## 2015-07-07 VITALS — BP 159/83 | HR 60 | Temp 98.0°F | Resp 20 | Wt 158.5 lb

## 2015-07-07 DIAGNOSIS — E039 Hypothyroidism, unspecified: Secondary | ICD-10-CM

## 2015-07-07 DIAGNOSIS — J438 Other emphysema: Secondary | ICD-10-CM

## 2015-07-07 DIAGNOSIS — R7303 Prediabetes: Secondary | ICD-10-CM

## 2015-07-07 DIAGNOSIS — I878 Other specified disorders of veins: Secondary | ICD-10-CM | POA: Diagnosis not present

## 2015-07-07 DIAGNOSIS — E785 Hyperlipidemia, unspecified: Secondary | ICD-10-CM

## 2015-07-07 DIAGNOSIS — I10 Essential (primary) hypertension: Secondary | ICD-10-CM

## 2015-07-07 LAB — POCT GLYCOSYLATED HEMOGLOBIN (HGB A1C): HEMOGLOBIN A1C: 5.4

## 2015-07-07 NOTE — Patient Instructions (Signed)
1 month for medicare wellness visit. Will do needed labs then, please fast 9 hours.

## 2015-07-07 NOTE — Progress Notes (Signed)
[name], female   DOB: 04-13-1934, 82 y.o.   MRN: [id number]   Subjective:    [name], female    DOB: 1934/01/23, 82 y.o.   MRN: [id number]  HPI Patient presents for scheduled visit on multiple chronic conditions.  Prediabetes:  Patient is diet controlled currently not on any medications last A1c was 5.9-->5.5. She denies any numbness or tingling in her extremities, admits to occasional burning in her toes. She denies any nonhealing wounds. She denies any hypo-or hyperglycemic events. She does not check her sugars at home. She does not exercise daily.  Hypertension: patient's blood pressure is mildly above goal today.  She does report compliance with her losartan and atenolol. She has followed by Amy Rodriguez /cardiology with a recent appt.  She experienced cough with lisinopril, but has tolerated arb. Amy Rodriguez has given her instructions to monitor pressures at home and if above Q000111Q systolic she is to call his office. Pt is aware of this plan.   Emphysema:  Patient follows with pulmonology for her emphysema. She has had a recent follow up with pulmonology who recommended PRN albuterol only. Pt states she rarely needs to take this medication.   Hypothyroid: patient follows with endocrinology for her hypothyroid and enlarged thyroid. Patient's TSH and thyroid studies have been normal. Patient continues Synthroid 75 g a day. She reports compliance with her medications , and denies any side effects. She has had a recent thyroid US by endocrine, reviewed results today.  Past Medical History  Diagnosis Date  . Supraventricular tachycardia, paroxysmal ([place])   . HTN (hypertension)   . Hyperlipidemia   . Postmenopausal HRT (hormone replacement therapy)   . Other seborrheic keratosis   . Vaginal pruritus   . Urinary frequency   . Diverticulosis of colon   . Blood in stool   . Pain in joint, lower leg   . Osteopenia   . IBS (irritable bowel syndrome)   .  Hypothyroidism     TSH 14.488 (11/2005)  . H pylori ulcer     not treated due to expense  10/2001  . PPD positive     history +PPD 1984, no treatment, no abnormal CXR  . Posterior vitreous detachment 1996  . Multiple pigmented nevi     last derm evaluation 01/2003  . Postmenopausal     s/p hysterectomy for h/o cervical cancer 1982 (both ovaries taken at that time) now on hormonal replacement   . Shortness of breath     with ambulation  . Environmental allergies   . Lactose intolerance   . GERD (gastroesophageal reflux disease)   . Hand dermatitis   . Cervical cancer ([place])     s/p hysterectomy/oop  . PONV (postoperative nausea and vomiting)     "and takes me a long time to come out under it" (02/08/2012)  . Pneumonia     "couple times in my lifetime" (02/08/2012)  . History of bronchitis     "w/a cold" (02/08/2012)  . Migraines     "get them very rarely now" (02/08/2012)  . OA (osteoarthritis)     multiple sites  . Arthritis     sed rate 10, RF, CCP pending  . DDD (degenerative disc disease), lumbosacral   . Osteoporosis     "borderline" (02/08/2012)  . Depression     Due to husbands passing away 09/22/2011   Allergies  Allergen Reactions  . Benzonatate Nausea And Vomiting    "tessalon  pearl"  . Sulfonamide Derivatives Itching and Rash  . Amlodipine Other (See Comments)    Heart palpitations    Past Surgical History  Procedure Laterality Date  . Eye surgery      cataract removal right eye  . Colonoscopy    . Total abdominal hysterectomy w/ bilateral salpingoophorectomy  1982  . Cataract extraction w/ intraocular lens implant  ?1997    right  . Total knee arthroplasty  02/08/2012    Procedure: TOTAL KNEE ARTHROPLASTY;  Surgeon: Amy Dibble, MD;  Location: [facility];  Service: Orthopedics;  Laterality: Left;  . Cardiovascular stress test  12/07/11    Normal nuclear stress test  . Abdominal hysterectomy  1982   Family History  Problem Relation Age of Onset  .  Breast cancer Mother 24  . Lung cancer Brother 26  . Cancer Brother     lung  . Heart failure Father     congestive  . Heart disease Father   . Epilepsy Son   . Kidney disease Son     tuberous sclerosis, both kidneys removed, has transplant  . Diabetes Maternal Uncle   . Diabetes Paternal Aunt   . Cancer Maternal Grandmother     breast  . Heart disease Son     [place] white  . COPD Son    Social History   Social History  . Marital Status: Married    Spouse Name: N/A  . Number of Children: 3  . Years of Education: 12TH   Occupational History  . retired     Scientist, research (medical)  .     Social History Main Topics  . Smoking status: Former Smoker -- 1.00 packs/day for 25 years    Types: Cigarettes    Quit date: 04/17/1980  . Smokeless tobacco: Former Systems developer    Quit date: 03/16/1984     Comment: passive smoker, husband smoked  . Alcohol Use: No  . Drug Use: No  . Sexual Activity: No   Other Topics Concern  . Not on file   Social History Narrative   Lives with son, Amy Rodriguez who has medical problems-they help each other. Her husband died October 20, 2022 after 93 years of marriage.  She has 2 other sons that live within 2 hrs from her. She does not drive-she uses [facility]. She used to work in Scientist, research (medical), retired.     Tobacco: 25 pack yr hx, quit 1985.    Review of Systems Negative, with the exception of above mentioned in HPI      Objective:   Physical Exam BP 159/83 mmHg  Pulse 60  Temp(Src) 98 F (36.7 C)  Resp 20  Wt 158 lb 8 oz (71.895 kg)  SpO2 96% Gen: Afebrile. No acute distress.  Nontoxic in appearance, well-developed, well-nourished Caucasian female , very pleasant HENT: AT. [place]. Bilateral TM visualized and normal in appearance. MMM. Bilateral nares  Without erythema or swelling. Throat without erythema or exudates.  Eyes:Pupils Equal Round Reactive to light, Extraocular movements intact,  Conjunctiva without redness, discharge or icterus. Neck/lymp/endocrine: Supple, no  lymphadenopathy, mild Thyroid fullness present  CV: RRR, trace edema Chest: CTAB, no wheeze or crackles Abd: Soft.  round. NTND. BS  present.  no Masses palpated.     Assessment & Plan:  Dyslipidemia - weight gain of 5.5 lbs -   As the lipids at next appointment (medicare wellness), will continue Zocor 10 mg tablet. Refills called in today. - simvastatin (ZOCOR) 10 MG tablet; Take 1 tablet (  10 mg total) by mouth every evening.  Dispense: 90 tablet; Refill: 2  Hypothyroidism, unspecified hypothyroidism type - Thyroid hashimoto's followed by Endocrine.  - continue routine f/u per orders.  Prediabetes - weight gain 5.5 lbs -  Patient counseled on healthy food choices and exercise. Exercise program given, transportation is a problem.  - POCT HgB A1C today--> 5.4 -  No further re-check indicated   Other emphysema -  Albuterol as needed only, by pulmonology.  -  Continue to follow with pulmonology as indicated.  Venous stasis - mild, discussed with pt today, to keep feet elevated.  - Compression stockings  HYPERTENSION, BENIGN ESSENTIAL - continue to follow with cardio. - continue to monitor at home, cardiology given parameters of 99991111 systolic she needs to call the office.  - low salt diet.    follow-up in 4 weeks for medicare wellness/labs/screening.

## 2015-08-03 ENCOUNTER — Encounter: Payer: Self-pay | Admitting: Family Medicine

## 2015-08-03 ENCOUNTER — Ambulatory Visit (INDEPENDENT_AMBULATORY_CARE_PROVIDER_SITE_OTHER): Payer: Medicare HMO | Admitting: Family Medicine

## 2015-08-03 ENCOUNTER — Other Ambulatory Visit: Payer: Self-pay | Admitting: Family Medicine

## 2015-08-03 VITALS — BP 140/89 | HR 58 | Temp 98.1°F | Resp 20 | Ht 62.0 in | Wt 157.8 lb

## 2015-08-03 DIAGNOSIS — Z1231 Encounter for screening mammogram for malignant neoplasm of breast: Secondary | ICD-10-CM

## 2015-08-03 DIAGNOSIS — M8580 Other specified disorders of bone density and structure, unspecified site: Secondary | ICD-10-CM | POA: Diagnosis not present

## 2015-08-03 DIAGNOSIS — E063 Autoimmune thyroiditis: Secondary | ICD-10-CM

## 2015-08-03 DIAGNOSIS — Z0000 Encounter for general adult medical examination without abnormal findings: Secondary | ICD-10-CM | POA: Diagnosis not present

## 2015-08-03 DIAGNOSIS — E785 Hyperlipidemia, unspecified: Secondary | ICD-10-CM

## 2015-08-03 DIAGNOSIS — Z1212 Encounter for screening for malignant neoplasm of rectum: Secondary | ICD-10-CM

## 2015-08-03 DIAGNOSIS — M810 Age-related osteoporosis without current pathological fracture: Secondary | ICD-10-CM | POA: Insufficient documentation

## 2015-08-03 DIAGNOSIS — Z1211 Encounter for screening for malignant neoplasm of colon: Secondary | ICD-10-CM

## 2015-08-03 MED ORDER — FEXOFENADINE HCL 180 MG PO TABS: 180.0000 mg | ORAL_TABLET | Freq: Every day | ORAL | Status: DC

## 2015-08-03 NOTE — Patient Instructions (Signed)
  Amy Rodriguez , Thank you for taking time to come for your Medicare Wellness Visit. I appreciate your ongoing commitment to your health goals. Please review the following plan we discussed and let me know if I can assist you in the future.   These are the goals we discussed: Goals    None      This is a list of the screening recommended for you and due dates:  Health Maintenance  Topic Date Due  . Mammogram  01/13/2014  . DEXA scan (bone density measurement)  01/14/2015  . Flu Shot  11/16/2015  . Tetanus Vaccine  12/18/2022  . Shingles Vaccine  Completed  . Pneumonia vaccines  Completed    I have ordered mammogram, bone density and colonoscopy referral. I will call you with labs once available;le.

## 2015-08-03 NOTE — Progress Notes (Signed)
[name], female   DOB: [birth year], 82 y.o.   MRN: [id number] Medicare wellness visit, subsequent    History of Present Ilness: Amy Rodriguez, 82 y.o. , female presents today for Medicare wellness visit.  Vital Signs: BP 140/89 mmHg  Pulse 58  Temp(Src) 98.1 F (36.7 C) (Oral)  Resp 20  Ht 5\' 2"  (1.575 m)  Wt 157 lb 12 oz (71.555 kg)  BMI 28.85 kg/m2  SpO2 95% List of providers/suppliers:  Updated in pts records (snapshot) Dr. Lamonte Sakai- Pulmonology Dr. Kathrin Penner- opthalmology Dr. Stanford Breed- cardiology  Dr. Leonie Man- Neurology Dr. Kaplan-gastroenterology  Past medical, surgical, family and social histories reviewed (including experiences with illnesses, hospital stays, operations, injuries, and treatments):  Past Medical History  Diagnosis Date  . Supraventricular tachycardia, paroxysmal ([place])   . HTN (hypertension)   . Hyperlipidemia   . Postmenopausal HRT (hormone replacement therapy)   . Other seborrheic keratosis   . Vaginal pruritus   . Urinary frequency   . Diverticulosis of colon   . Blood in stool   . Pain in joint, lower leg   . Osteopenia   . IBS (irritable bowel syndrome)   . Hypothyroidism     TSH 14.488 (11/2005)  . H pylori ulcer     not treated due to expense  10/2001  . PPD positive     history +PPD 1984, no treatment, no abnormal CXR  . Posterior vitreous detachment 1996  . Multiple pigmented nevi     last derm evaluation 01/2003  . Postmenopausal     s/p hysterectomy for h/o cervical cancer 1982 (both ovaries taken at that time) now on hormonal replacement   . Shortness of breath     with ambulation  . Environmental allergies   . Lactose intolerance   . GERD (gastroesophageal reflux disease)   . Hand dermatitis   . Cervical cancer ([place])     s/p hysterectomy/oop  . PONV (postoperative nausea and vomiting)     "and takes me a long time to come out under it" (02/08/2012)  . Pneumonia     "couple times in my lifetime" (02/08/2012)   . History of bronchitis     "w/a cold" (02/08/2012)  . Migraines     "get them very rarely now" (02/08/2012)  . OA (osteoarthritis)     multiple sites  . Arthritis     sed rate 10, RF, CCP pending  . DDD (degenerative disc disease), lumbosacral   . Osteoporosis     "borderline" (02/08/2012)  . Depression     Due to husbands passing away 09/22/2011   All allergies reviewed Allergies  Allergen Reactions  . Benzonatate Nausea And Vomiting    "tessalon pearl"  . Sulfonamide Derivatives Itching and Rash  . Amlodipine Other (See Comments)    Heart palpitations    Past Surgical History  Procedure Laterality Date  . Eye surgery      cataract removal right eye  . Colonoscopy    . Total abdominal hysterectomy w/ bilateral salpingoophorectomy  1982  . Cataract extraction w/ intraocular lens implant  ?1997    right  . Total knee arthroplasty  02/08/2012    Procedure: TOTAL KNEE ARTHROPLASTY;  Surgeon: Hessie Dibble, MD;  Location: [place];  Service: Orthopedics;  Laterality: Left;  . Cardiovascular stress test  12/07/11    Normal nuclear stress test  . Abdominal hysterectomy  1982   Family History  Problem Relation Age of Onset  . Breast cancer  Mother 30  . Lung cancer Brother 31  . Cancer Brother     lung  . Heart failure Father     congestive  . Heart disease Father   . Epilepsy Son   . Kidney disease Son     tuberous sclerosis, both kidneys removed, has transplant  . Diabetes Maternal Uncle   . Diabetes Paternal Aunt   . Cancer Maternal Grandmother     breast  . Heart disease Son     [place]  . COPD Son    Social History   Social History Narrative   Lives with son, [name] who has medical problems-they help each other. Her husband died 10-28-22 after 42 years of marriage.  She has 2 other sons that live within 2 hrs from her. She does not drive-she uses [facility]. She used to work in [employer] (medical), retired.     Tobacco: 25 pack yr hx, quit 1985.    All  medications verified   Medication List       This list is accurate as of: 08/03/15 11:59 PM.  Always use your most recent med list.               albuterol 108 (90 Base) MCG/ACT inhaler  Commonly known as:  PROVENTIL HFA;VENTOLIN HFA  Inhale 2 puffs into the lungs every 6 (six) hours as needed for wheezing.     aspirin EC 81 MG tablet  Take 81 mg by mouth daily.     atenolol 25 MG tablet  Commonly known as:  TENORMIN  Take 0.5 tablets (12.5 mg total) by mouth 2 (two) times daily.     BREATHERITE COLL SPACER ADULT Misc  Use with  Albuterol Inhaler.     fexofenadine 180 MG tablet  Commonly known as:  ALLEGRA  Take 1 tablet (180 mg total) by mouth daily.     fluticasone 50 MCG/ACT nasal spray  Commonly known as:  FLONASE  Place 2 sprays into both nostrils daily.     fluticasone-salmeterol 230-21 MCG/ACT inhaler  Commonly known as:  ADVAIR HFA  Inhale 2 puffs into the lungs 2 (two) times daily.     levothyroxine 75 MCG tablet  Commonly known as:  SYNTHROID, LEVOTHROID  Take 1 tablet (75 mcg total) by mouth daily before breakfast.     losartan 50 MG tablet  Commonly known as:  COZAAR  Take 1 tablet (50 mg total) by mouth daily.     omeprazole 10 MG capsule  Commonly known as:  PRILOSEC  Take 1 capsule (10 mg total) by mouth daily.     simvastatin 10 MG tablet  Commonly known as:  ZOCOR  Take 1 tablet (10 mg total) by mouth every evening.     VITAMIN B12 PO  Take 15 mLs by mouth daily.     VOLTAREN 1 % Gel  Generic drug:  diclofenac sodium       Exercise/Diet: Current Exercise Habits: The patient does not participate in regular exercise at present   Diet: Regular  Functional Status Survey: Is the patient deaf or have difficulty hearing?: Yes Does the patient have difficulty seeing, even when wearing glasses/contacts?: Yes Does the patient have difficulty concentrating, remembering, or making decisions?: No Does the patient have difficulty walking or  climbing stairs?: No Does the patient have difficulty dressing or bathing?: No Does the patient have difficulty doing errands alone such as visiting a doctor's office or shopping?: No  Fall Risk  08/03/2015 07/08/2014  Falls in the past year? No No   Cognitive assessment: No deficits noted today. Hearing screening: No deficits noted today. Depression Screening: Depression screen [facility] 2/9 08/03/2015 07/08/2014 12/05/2011  Decreased Interest 0 0 1  Down, Depressed, Hopeless 0 0 0  PHQ - 2 Score 0 0 1    Advanced Care Planning: Code status on file: No MOST form information and copy given: No Health Care Power Of Attorney information provided: No Advanced Directives: Code Status: Unknown, gave patient a MOST form today to consider filling out, or incorporating this into her directives. Advanced care directive scanned into chart: No, advanced directives have been requested, patient states that she does have completed home she just eats to bring in a copy for [place].  Health maintenance:  Immunizations: Immunization History  Administered Date(s) Administered  . Influenza Split 01/12/2011, 02/09/2012  . Influenza Whole 02/08/2007, 01/24/2008, 12/16/2008, 01/27/2009, 02/17/2010  . Influenza,inj,Quad PF,36+ Mos 12/17/2012, 12/19/2013, 01/08/2015  . Pneumococcal Conjugate-13 07/08/2014  . Pneumococcal Polysaccharide-23 05/23/2004  . Td 06/16/2002  . Tdap 12/17/2012  . Zoster 12/17/2012   Info -Pneumococcal (once in a lifetime after age 80); Seasonal Influenza annually; Tetanus ( Tdap ) once every 10 years- not covered by medicare; Zostavax - Shingles vaccine - not covered by Part A or B   Bone mass measurements Date of Study:  12/2012      Results By Site: Osteopenia Recommendations: Two-year follow-up, patient currently not on any medications. Info:  USPSTF: "B" recommendation to screen, >2 yr interval advised. Coverage: Medicare patients at risk for developing Osteoporosis. Medicare covers  every 24 months or based on previous test.   Cardiovascular Screening Blood Tests Lipid Panel     Component Value Date/Time   CHOL 129 08/03/2015 1123   TRIG 107 08/03/2015 1123   HDL 43* 08/03/2015 1123   CHOLHDL 3.0 08/03/2015 1123   VLDL 21 08/03/2015 1123   [place] 65 08/03/2015 1123   LDLDIRECT 68 12/05/2011 1621    Info:  USPSTF: "C" recommendation for no screening unless patient has risk factors, "A" recommendation to screen if has risk factors: HTN, DM, ASCVD, Fam Hx of ASCVD in men <50, women <60, Tobacco use, BMI > 30. USPSTF prefers Total Cholesterol and HDL for screening. USPSTF advises every 5 years Coverage: All asymptomic Medicare patients (No fast required for total and HDL measurement. 12-hr fast is required if lipid panel done)  Diabetes Screening Tests Recent Results (from the past 2160 hour(s))  POCT glycosylated hemoglobin (Hb A1C)     Status: Normal   Collection Time: 07/07/15  1:16 PM  Result Value Ref Range   Hemoglobin A1C 5.4   Lipid panel     Status: Abnormal   Collection Time: 08/03/15 11:23 AM  Result Value Ref Range   Cholesterol 129 125 - 200 mg/dL   Triglycerides 107 <150 mg/dL   HDL 43 (L) >=46 mg/dL   Total CHOL/HDL Ratio 3.0 <=5.0 Ratio   VLDL 21 <30 mg/dL   LDL Cholesterol 65 <130 mg/dL    Comment:   Total Cholesterol/HDL Ratio:CHD Risk                        Coronary Heart Disease Risk Table                                        Men  Women          1/2 Average Risk              3.4        3.3              Average Risk              5.0        4.4           2X Average Risk              9.6        7.1           3X Average Risk             23.4       11.0 Use the calculated Patient Ratio above and the CHD Risk table  to determine the patient's CHD Risk.   VITAMIN D 25 Hydroxy (Vit-D Deficiency, Fractures)     Status: None   Collection Time: 08/03/15 11:23 AM  Result Value Ref Range   Vit D, 25-Hydroxy 38 30 - 100 ng/mL     Comment: Vitamin D Status           25-OH Vitamin D        Deficiency                <20 ng/mL        Insufficiency         20 - 29 ng/mL        Optimal             > or = 30 ng/mL   For 25-OH Vitamin D testing on patients on D2-supplementation and patients for whom quantitation of D2 and D3 fractions is required, the QuestAssureD 25-OH VIT D, (D2,D3), LC/MS/MS is recommended: order code [id number] (patients > 2 yrs).   TSH     Status: None   Collection Time: 08/03/15 11:26 AM  Result Value Ref Range   TSH 2.57 mIU/L    Comment:   Reference Range   > or = 20 Years  0.40-4.50   Pregnancy Range First trimester  0.26-2.66 Second trimester 0.55-2.73 Third trimester  0.43-2.91      Info USPSTF: "B" recommendation to screen if patient has sustained BP > 135/80; ([employer] covers 2 screening tests per year for patient diagnosed with pre-diabetes; 1 screening per year if previously tested, but not diagnosed with pre-diabetes or if never tested) Coverage: - Medicare patients with certain risk factors for diabetes or diagnosed with pre-diabetes (patients previously diagnosed with diabetes aren't eligible for benefit)  Diabetes Self-Mgmt Training and Medical Nutrition Therapy  Date previously done:  None  Info: (Up to 10 hrs of initial training within a continuous 97mo period; subsequent yrs up to 2hrs follow-up training each year after initial year) ;  Coverage: Medicare patients at risk for complications from diabetes, recently diagnosed with diabetes or previously diagnosed with diabetes (must certify DSMT need)   Colorectal Cancer Screening Last Colonoscopy: 04/2007, with precancerous polyp found. Likely recommended 3-5 years follow-up, although patient is not certain. Done by: Dr. Deatra Ina Info USPSTF: "A" for CRC screening, ages 34-74;  3 screening methods: USPSTF says no method is preferred - Annual high-sensitivity FOBT OR - Flex sig Q 5 y + annual FOBT OR - Colonoscopy Q 10 y;   Medicare covers:  -Flexible sigmoidoscopy (84yrs, or once every 10 yrs after a screening colonoscopy) -Screening Colonoscopy (every  5 yrs if at high risk; every 10 yrs otherwise) -Fecal Occult Blood Test (annually) or Cologuard q3 years Coverage: Medicare covers for patients age 87 and up - Screening colonoscopy: For those at high risk; no minimum age  Glaucoma Screening  Date of Last ophthalmology evaluation: 2017 Done by: Dr. Yetta Flock Acuity Screen: completed by opthalmology, 2 months ago seen eye doctor.  Info USPSTF: "I" recommendation - Insufficent evidence to recommend for or against screening (Medicare coveres annually for patients in a high risk group);  Coverage: Medicare covers for patients with diabetes mellitus, family history of glaucoma, African-Americans age 71 and over, or 76 age 27 and up  Screening Pap tests and Pelvic Exam Last Pap and Pelvic exam: Not indicated and secondary to age.  Info:  USPSTF: "D" recommendation against cervical cancer screening for women > age 30 with adequate screening history not at high risk for cervical cancer.  (Medicare covers annually if high-risk, or childbearing age with abnormal Pap test within past 3 yrs; every 24 months for all other women); Medicare covers for all female Medicare patients  Screening Mammography The patient has a family history of breast cancer in her mother FH present, last mammogram 2014, normal mammorgram history.  Info:  USPSTF "B" recommendation for mammography every 2 years ages 89-74;  (Medicare covers annually); Medicare covers for all female patients 77 or older  AAA/EKG Screening: completed if indicated and medicare welcome visit.   Alcohol abuse screening: completed if indicated  STIs screening: Completed if indicated  Assessment and Plan: Lipids and TSH collected today Pt was counseled on MOST forms and Advance directives. She will bring her completed directive in for  copying.  Screening mammogram ordered today. Density screening ordered today. Referral to colonoscopy placed today. Patient likely due for repeat if desired or recommended by her gastroenterologist, since she did have a precancerous polyp.  Education and Counseling Provided:  See after visit summary that was printed and given to patient 1. Tobacco abuse counseling 2. Alcohol abuse counseling 3. STIs counseling 4. Referrals made  Referrals/orders made if indicated: 1. IBT (Intensive Behavior Therapy) for Cardiovascular disease 2. IBT for Obesity 3. MNT (Medical Nutrition Therapy)  4. Behavioral Counseling for Alcohol abuse  5. HIBT (High Intensity Behavioral Counseling) to prevent STIs (Sexually Transmitted Infections) 6. [place] for AAA screening - IPPE only 7. EKG screening- IPPE only 8. Low dose CT- lung cancer screen 9. Pelvic/PAP exam 10. PSA/Prostate 11. Screen mammogram--> ordered today 12. HIV screen 13. Hep C screen 14. Glaucoma screen 15. Diabetes screen 16. Colon cancer screen--. Will refer back to Dr. Deatra Ina, overdue for follow up.  17. DEXA--> ordered today, osteopenia.   Vaccinations Administered if indicated: 1. Influenza 2. PPV23/prevnar 13 3. Hepatitis B 4. Tdap - print script 5. Shingle's vaccination- print script  Electronically Signed by: Howard Pouch, DO [place]

## 2015-08-04 ENCOUNTER — Telehealth: Payer: Self-pay | Admitting: Family Medicine

## 2015-08-04 ENCOUNTER — Encounter: Payer: Self-pay | Admitting: Gastroenterology

## 2015-08-04 DIAGNOSIS — Z1212 Encounter for screening for malignant neoplasm of rectum: Secondary | ICD-10-CM

## 2015-08-04 DIAGNOSIS — Z1231 Encounter for screening mammogram for malignant neoplasm of breast: Secondary | ICD-10-CM | POA: Insufficient documentation

## 2015-08-04 DIAGNOSIS — Z1211 Encounter for screening for malignant neoplasm of colon: Secondary | ICD-10-CM | POA: Insufficient documentation

## 2015-08-04 LAB — TSH: TSH: 2.57 m[IU]/L

## 2015-08-04 LAB — LIPID PANEL
CHOL/HDL RATIO: 3 ratio (ref <=5.0)
CHOLESTEROL: 129 mg/dL (ref 125–200)
HDL: 43 mg/dL — ABNORMAL LOW (ref >=46)
LDL Cholesterol: 65 mg/dL (ref <130)
Triglycerides: 107 mg/dL (ref <150)
VLDL: 21 mg/dL (ref <30)

## 2015-08-04 LAB — VITAMIN D 25 HYDROXY (VIT D DEFICIENCY, FRACTURES): Vit D, 25-Hydroxy: 38 ng/mL (ref 30–100)

## 2015-08-04 NOTE — Telephone Encounter (Signed)
Left message with lab results on patient voice mail 

## 2015-08-04 NOTE — Telephone Encounter (Signed)
Please call patient: Her laboratory results appear normal/stable.

## 2015-08-31 ENCOUNTER — Telehealth: Payer: Self-pay | Admitting: Family Medicine

## 2015-08-31 ENCOUNTER — Ambulatory Visit
Admission: RE | Admit: 2015-08-31 | Discharge: 2015-08-31 | Disposition: A | Discharge status: Home / Self Care | Payer: Medicare HMO | Source: Ambulatory Visit | Attending: Family Medicine | Admitting: Family Medicine

## 2015-08-31 DIAGNOSIS — M8580 Other specified disorders of bone density and structure, unspecified site: Secondary | ICD-10-CM

## 2015-08-31 DIAGNOSIS — Z1231 Encounter for screening mammogram for malignant neoplasm of breast: Secondary | ICD-10-CM

## 2015-08-31 MED ORDER — ALENDRONATE SODIUM 70 MG PO TABS: 70.0000 mg | ORAL_TABLET | ORAL | Status: DC

## 2015-08-31 NOTE — Telephone Encounter (Signed)
Please call patient: - Her bone density resulted with mildly worsening osteopenia since her last scan in 2014, but not quite osteoporosis. If patient amendable I would like to start Fosamax once a week dosing if she can tolerate. Patient will need to be able to sit upright for at least 30 minutes after taking medication, on an empty stomach with a full glass of water. This will help prevent worsening osteoporosis. Would want to follow-up with her in 4 weeks to make sure she is tolerating medication. She is amendable I will call in medication.

## 2015-08-31 NOTE — Telephone Encounter (Signed)
Spoke with patient reviewed test results. Patient willing to try the Fosomax. Rx sent to patient pharmacy per Dr Raoul Pitch verbal order.

## 2015-09-11 IMAGING — DX DG CHEST 2V
2 series · 2 of 2 positions shown · non-contrast
Comparison: 02/06/2012

CLINICAL DATA: Cough, fever and congestion.

EXAM:
CHEST - 2 VIEW

[chest pa]
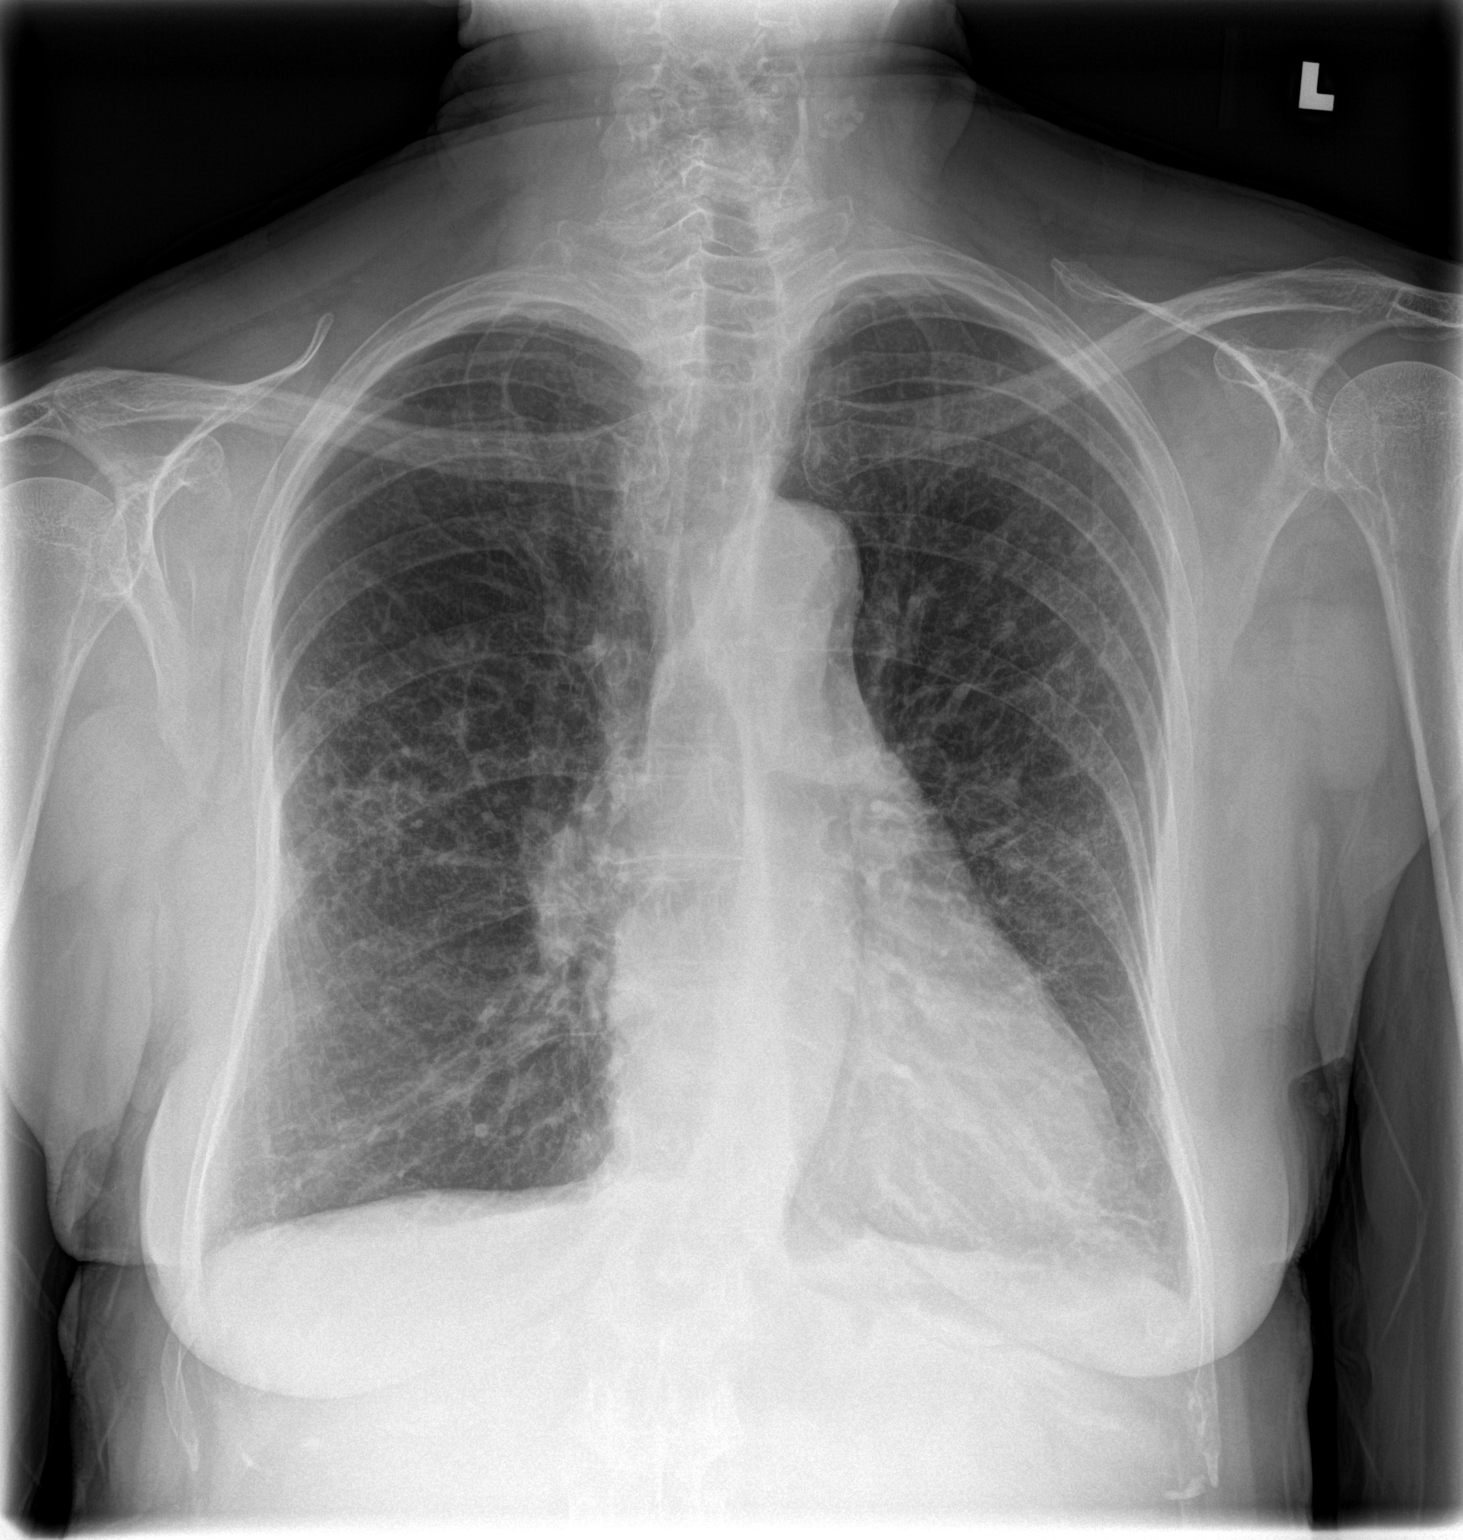

[chest lat]
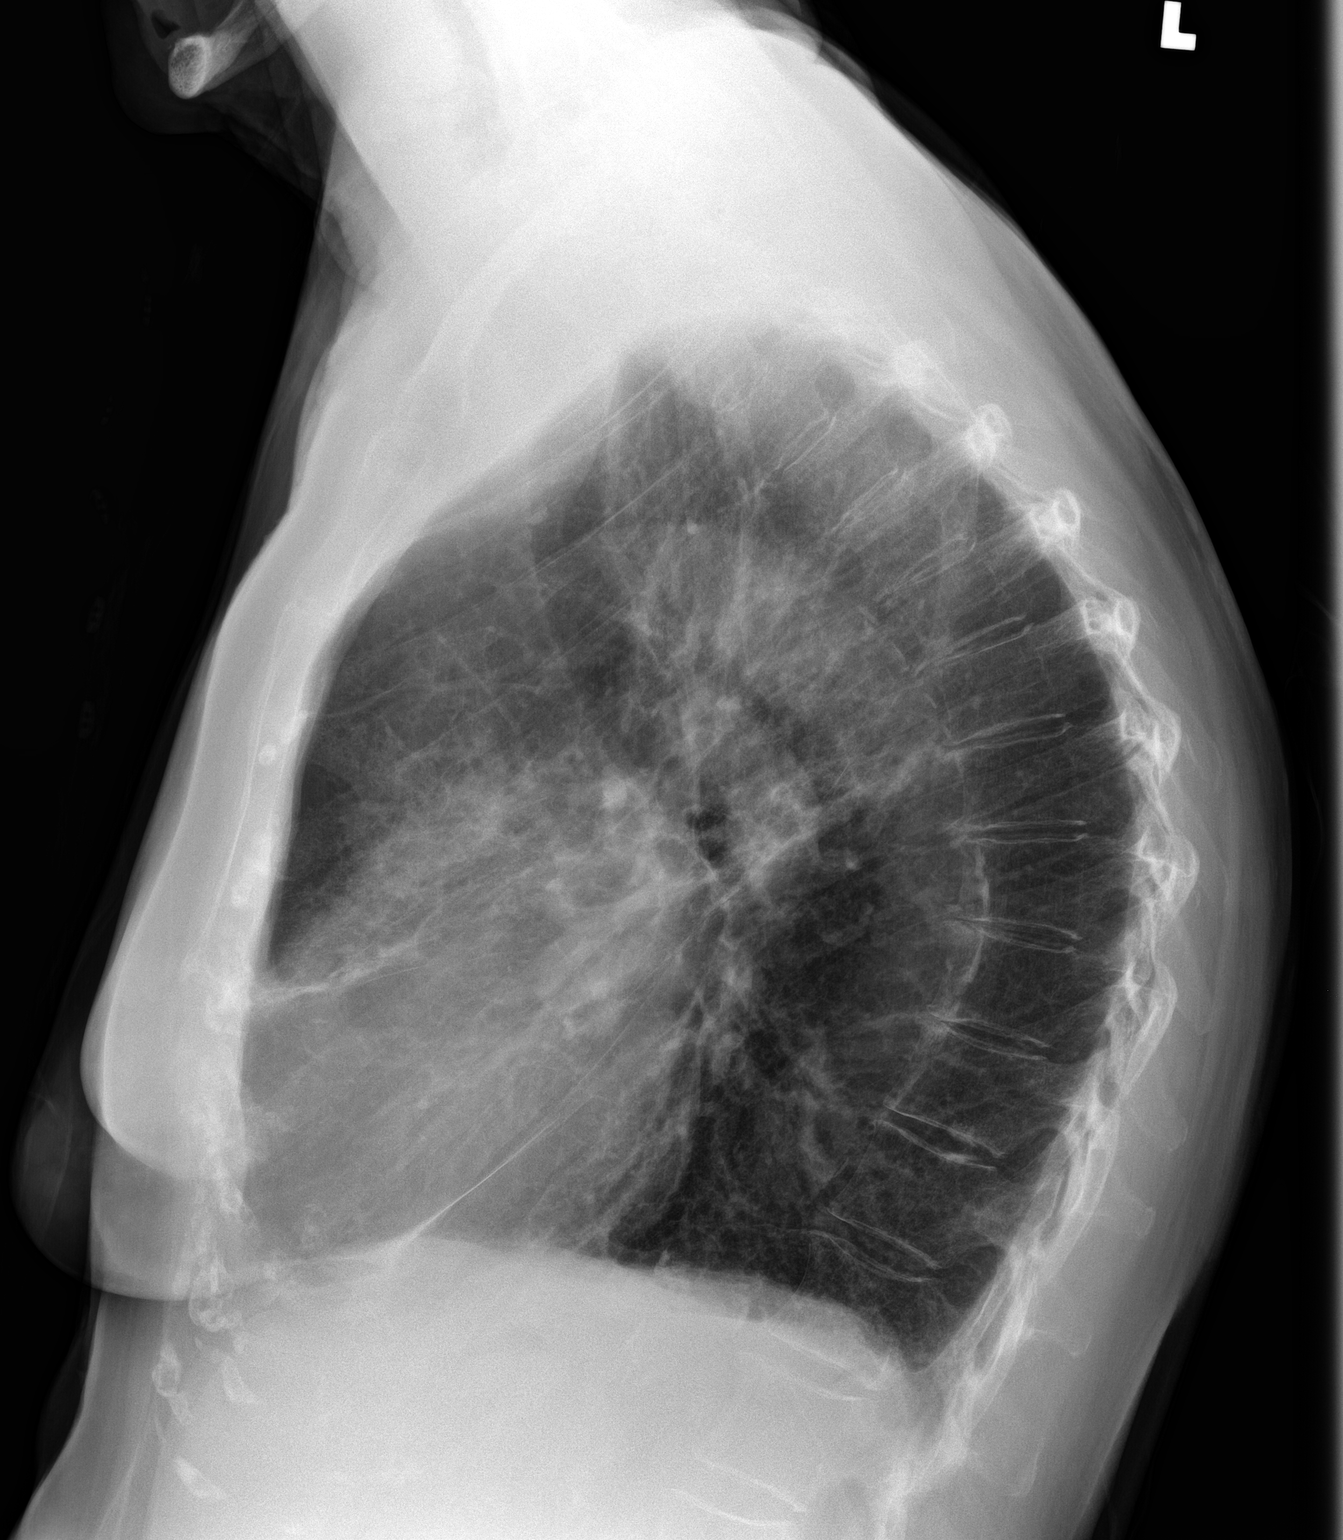

[2 of 2 positions shown; findings below may reference images not displayed]

FINDINGS: Superimposed on COPD and chronic interstitial lung disease is a
subtle, patchy area of potential acute infiltrate in the right mid
lung, likely the right upper lobe. There are scattered areas of
scarring and subsegmental atelectasis present. No overt edema or
pleural fluid. The heart size and mediastinal contours are normal.
No pneumothorax. The bony thorax shows stable osteopenia of the
spine.
IMPRESSION: Potential subtle acute infiltrate in the right upper lobe
superimposed on COPD and chronic interstitial lung disease. aa

## 2015-09-21 ENCOUNTER — Ambulatory Visit (AMBULATORY_SURGERY_CENTER): Payer: Self-pay

## 2015-09-21 VITALS — Ht 61.0 in | Wt 158.8 lb

## 2015-09-21 DIAGNOSIS — Z860100 Personal history of colon polyps, unspecified: Secondary | ICD-10-CM

## 2015-09-21 NOTE — Progress Notes (Signed)
No allergies to eggs or soy No home oxygen No past problems with anesthesia No diet meds  Has email and internet; declined emmi

## 2015-09-27 ENCOUNTER — Telehealth: Payer: Self-pay | Admitting: Gastroenterology

## 2015-09-27 NOTE — Telephone Encounter (Signed)
Error. Colon Appointment was 09-30-15.

## 2015-09-27 NOTE — Telephone Encounter (Signed)
I do not have any procedures scheduled for today.  Regardless, I do not charge a cancellation fee for family emergency.  Thanks for checking.  - HD

## 2015-09-30 ENCOUNTER — Encounter: Payer: Medicare HMO | Admitting: Gastroenterology

## 2015-10-07 ENCOUNTER — Encounter: Payer: Self-pay | Admitting: Gastroenterology

## 2015-10-07 ENCOUNTER — Ambulatory Visit (AMBULATORY_SURGERY_CENTER): Payer: Medicare HMO | Admitting: Gastroenterology

## 2015-10-07 VITALS — BP 154/81 | HR 62 | Temp 98.6°F | Resp 15 | Ht 61.0 in | Wt 158.0 lb

## 2015-10-07 DIAGNOSIS — Z86010 Personal history of colonic polyps: Secondary | ICD-10-CM

## 2015-10-07 DIAGNOSIS — D122 Benign neoplasm of ascending colon: Secondary | ICD-10-CM | POA: Diagnosis not present

## 2015-10-07 MED ORDER — SODIUM CHLORIDE 0.9 % IV SOLN: 500.0000 mL | INTRAVENOUS | Status: DC

## 2015-10-07 NOTE — Progress Notes (Signed)
Report to PACU, RN, vss, BBS= Clear.  

## 2015-10-07 NOTE — Op Note (Signed)
[facility] Patient Name: Amy Rodriguez Procedure Date: 10/07/2015 2:20 PM MRN: [id number] Endoscopist: Mallie Mussel L. Loletha Carrow , MD Age: 82 Referring MD:  Date of Birth: 24-Feb-1934 Gender: Female Account #: [id number] Procedure:                Colonoscopy Indications:              Surveillance: Personal history of adenomatous                            polyps on last colonoscopy > 5 years ago (2009) Medicines:                Monitored Anesthesia Care Procedure:                Pre-Anesthesia Assessment:                           - Prior to the procedure, a History and Physical                            was performed, and patient medications and                            allergies were reviewed. The patient's tolerance of                            previous anesthesia was also reviewed. The risks                            and benefits of the procedure and the sedation                            options and risks were discussed with the patient.                            All questions were answered, and informed consent                            was obtained. Prior Anticoagulants: The patient has                            taken no previous anticoagulant or antiplatelet                            agents. ASA Grade Assessment: II - A patient with                            mild systemic disease. After reviewing the risks                            and benefits, the patient was deemed in                            satisfactory condition to undergo the procedure.  After obtaining informed consent, the colonoscope                            was passed under direct vision. Throughout the                            procedure, the patient's blood pressure, pulse, and                            oxygen saturations were monitored continuously. The                            Model CF-HQ190L [id number]) scope was introduced                            through the anus  and advanced to the the cecum,                            identified by appendiceal orifice and ileocecal                            valve. The colonoscopy was performed without                            difficulty. The patient tolerated the procedure                            well. The quality of the bowel preparation was                            good. The ileocecal valve, appendiceal orifice, and                            rectum were photographed. The bowel preparation                            used was Miralax. Scope In: 2:33:43 PM Scope Out: 2:48:34 PM Scope Withdrawal Time: 0 hours 10 minutes 12 seconds  Total Procedure Duration: 0 hours 14 minutes 51 seconds  Findings:                 The digital rectal exam findings include decreased                            sphincter tone.                           A 4 mm polyp was found in the distal ascending                            colon. The polyp was sessile. The polyp was removed                            with a cold snare. Resection and retrieval were  complete.                           Large-mouthed diverticula were found in the left                            colon.                           Internal hemorrhoids were found during                            retroflexion. The hemorrhoids were Grade I                            (internal hemorrhoids that do not prolapse).                           The exam was otherwise without abnormality. Complications:            No immediate complications. Estimated Blood Loss:     Estimated blood loss: none. Impression:               - Decreased sphincter tone found on digital rectal                            exam.                           - One 4 mm polyp in the distal ascending colon,                            removed with a cold snare. Resected and retrieved.                           - Diverticulosis in the left colon.                           - Internal  hemorrhoids.                           - The examination was otherwise normal. Recommendation:           - Patient has a contact number available for                            emergencies. The signs and symptoms of potential                            delayed complications were discussed with the                            patient. Return to normal activities tomorrow.                            Written discharge instructions were provided to the  patient.                           - Resume previous diet.                           - Continue present medications.                           - Await pathology results.                           - No recommendation at this time regarding repeat                            colonoscopy due to age. Jaquavion Mccannon L. Loletha Carrow, MD 10/07/2015 2:52:30 PM This report has been signed electronically.

## 2015-10-07 NOTE — Progress Notes (Signed)
Called to room to assist during endoscopic procedure.  Patient ID and intended procedure confirmed with present staff. Received instructions for my participation in the procedure from the performing physician.  

## 2015-10-07 NOTE — Progress Notes (Signed)
Pt. Waiting for transportation will discharge upon arrival.

## 2015-10-07 NOTE — Patient Instructions (Signed)
YOU HAD AN ENDOSCOPIC PROCEDURE TODAY AT THE [facility]:   Refer to the procedure report that was given to you for any specific questions about what was found during the examination.  If the procedure report does not answer your questions, please call your gastroenterologist to clarify.  If you requested that your care partner not be given the details of your procedure findings, then the procedure report has been included in a sealed envelope for you to review at your convenience later.  YOU SHOULD EXPECT: Some feelings of bloating in the abdomen. Passage of more gas than usual.  Walking can help get rid of the air that was put into your GI tract during the procedure and reduce the bloating. If you had a lower endoscopy (such as a colonoscopy or flexible sigmoidoscopy) you may notice spotting of blood in your stool or on the toilet paper. If you underwent a bowel prep for your procedure, you may not have a normal bowel movement for a few days.  Please Note:  You might notice some irritation and congestion in your nose or some drainage.  This is from the oxygen used during your procedure.  There is no need for concern and it should clear up in a day or so.  SYMPTOMS TO REPORT IMMEDIATELY:   Following lower endoscopy (colonoscopy or flexible sigmoidoscopy):  Excessive amounts of blood in the stool  Significant tenderness or worsening of abdominal pains  Swelling of the abdomen that is new, acute  Fever of 100F or higher    For urgent or emergent issues, a gastroenterologist can be reached at any hour by calling [phone].   DIET: Your first meal following the procedure should be a small meal and then it is ok to progress to your normal diet. Heavy or fried foods are harder to digest and may make you feel nauseous or bloated.  Likewise, meals heavy in dairy and vegetables can increase bloating.  Drink plenty of fluids but you should avoid alcoholic beverages for 24  hours.  ACTIVITY:  You should plan to take it easy for the rest of today and you should NOT DRIVE or use heavy machinery until tomorrow (because of the sedation medicines used during the test).    FOLLOW UP: Our staff will call the number listed on your records the next business day following your procedure to check on you and address any questions or concerns that you may have regarding the information given to you following your procedure. If we do not reach you, we will leave a message.  However, if you are feeling well and you are not experiencing any problems, there is no need to return our call.  We will assume that you have returned to your regular daily activities without incident.  If any biopsies were taken you will be contacted by phone or by letter within the next 1-3 weeks.  Please call us at [phone] if you have not heard about the biopsies in 3 weeks.    SIGNATURES/CONFIDENTIALITY: You and/or your care partner have signed paperwork which will be entered into your electronic medical record.  These signatures attest to the fact that that the information above on your After Visit Summary has been reviewed and is understood.  Full responsibility of the confidentiality of this discharge information lies with you and/or your care-partner.   Resume medications. Information given on polyps,diverticulosis,hemorrhoids and high fiber diet. 

## 2015-10-08 ENCOUNTER — Telehealth: Payer: Self-pay | Admitting: *Deleted

## 2015-10-08 ENCOUNTER — Encounter: Payer: Self-pay | Admitting: Gastroenterology

## 2015-10-08 ENCOUNTER — Ambulatory Visit (INDEPENDENT_AMBULATORY_CARE_PROVIDER_SITE_OTHER)
Admission: RE | Admit: 2015-10-08 | Discharge: 2015-10-08 | Disposition: A | Discharge status: Home / Self Care | Payer: Medicare HMO | Source: Ambulatory Visit | Attending: Gastroenterology | Admitting: Gastroenterology

## 2015-10-08 ENCOUNTER — Ambulatory Visit (INDEPENDENT_AMBULATORY_CARE_PROVIDER_SITE_OTHER): Payer: Self-pay | Admitting: Gastroenterology

## 2015-10-08 VITALS — BP 142/82 | HR 84 | Ht 61.0 in | Wt 159.0 lb

## 2015-10-08 DIAGNOSIS — R112 Nausea with vomiting, unspecified: Secondary | ICD-10-CM | POA: Diagnosis not present

## 2015-10-08 DIAGNOSIS — R1031 Right lower quadrant pain: Secondary | ICD-10-CM | POA: Diagnosis not present

## 2015-10-08 MED ORDER — ONDANSETRON HCL 4 MG PO TABS: 4.0000 mg | ORAL_TABLET | Freq: Three times a day (TID) | ORAL | Status: DC | PRN

## 2015-10-08 MED FILL — ONDANSETRON HCL 4 MG TABLET: 4 | 2 days supply | Qty: 6 | Fill #0

## 2015-10-08 NOTE — Telephone Encounter (Addendum)
  Follow up Call-  Call back number 10/07/2015  Post procedure Call Back phone  # [phone]  Permission to leave phone message Yes     Patient questions:  Do you have a fever, pain , or abdominal swelling? Yes.   Pain Score  5 *  Have you tolerated food without any problems? No.  Have you been able to return to your normal activities? No.  Do you have any questions about your discharge instructions: Diet   No. Medications  No. Follow up visit  No.  Do you have questions or concerns about your Care? Yes.    Actions: * If pain score is 4 or above: Physician/ provider Notified : Nelida Meuse, MD.   Pt c/o nausea, r upper adb pain, and headache, pt states she believes she is dehydrated and sipped on water all night but has not been able to tolerate food bc of the nausea, pts abd pain is 5/10 when just sitting still but when she coughs of moves pain increases to 7/8, pt states she passed lots of gas yesterday and last night but pain persist, Pt denies fever and any blood in stool, she will take temp again this morning and call me back if it is above 100.0, advised pt she can have gatorade to help with dehydration and tylenol for the headache, advised pt will notify Dr Loletha Carrow about pain and get back with her-adm

## 2015-10-08 NOTE — Telephone Encounter (Signed)
We'll have her come to clinic today and check her out.  I spoke to my nurse Patty, who will call her.

## 2015-10-08 NOTE — Progress Notes (Signed)
[facility] Progress Note  Chief Complaint: RLQ pain  Subjective History:  Called by endoscopy nursing this Am in routine endoscopy follow up.  She reported right sided abd pain and nausea all night. Brought to clinic for evaluation. Accompanied by family.  C/o chills last evening, nauseated and could only take in some water since procedure.  Has right sided pain worse with movement or deep breath. Making urine - did so just prior to clinic and once just after arriving. Passed flatus overnight and this AM without improvement in symptoms.  ROS: Cardiovascular:  no chest pain Respiratory: no dyspnea  The patient's Past Medical, Family and Social History were reviewed and are on file in the EMR.  Objective:  Med list reviewed  Vital signs in last 24 hrs: Filed Vitals:   10/08/15 1257  BP: 142/82  Pulse: 84    Physical Exam Appears fatigued, non-toxic  HEENT: sclera anicteric, oral mucosa moist without lesions  Neck: supple, no thyromegaly, JVD or lymphadenopathy  Cardiac: RRR without murmurs, S1S2 heard, no peripheral edema  Pulm: clear to auscultation bilaterally, normal RR and effort noted  Abdomen: soft, mildly distended and tympanitic,decreased bowel sounds. No guarding or palpable hepatosplenomegaly. Right-sided tenderness, no guarding or rebound.  Skin; warm and dry, no jaundice or rash  Colonoscopy yesterday - scope passed without difficulty, small right colon polyp removed with cold snare.   @ASSESSMENTPLANBEGIN @ Assessment: Encounter Diagnoses  Name Primary?  . Nausea and vomiting, intractability of vomiting not specified, unspecified vomiting type Yes  . RUQ pain     Perhaps trapped air in SB.  Perforation seems unlikely given ease of procedure and cold polypectomy.  Plan:   Sent patient downstairs for STAT CXR and KUB, then she will come back to clinic while we await reading from radiologist. Nelida Meuse III  Addendum:    KUB (personally  reviewed after radiology reading): nothing acute.  No free air, no ileus,  Good quality film, diaphragm was seen.  She will go home with phazyme and prn zofran.  Call service over weekend if needed

## 2015-10-08 NOTE — Patient Instructions (Addendum)
If you are age 80 or older, your body mass index should be between 23-30. Your Body mass index is 30.06 kg/(m^2). If this is out of the aforementioned range listed, please consider follow up with your Primary Care Provider.  If you are age 76 or younger, your body mass index should be between 19-25. Your Body mass index is 30.06 kg/(m^2). If this is out of the aformentioned range listed, please consider follow up with your Primary Care Provider.   Please report to the basement for x-rays.  We have sent the following medications to your pharmacy for you to pick up at your convenience: Zofran (sent to [facility])    Thank you for choosing [facility]  Dr Wilfrid Lund III

## 2015-10-11 ENCOUNTER — Other Ambulatory Visit: Payer: Self-pay | Admitting: *Deleted

## 2015-10-11 DIAGNOSIS — E039 Hypothyroidism, unspecified: Secondary | ICD-10-CM

## 2015-10-11 MED ORDER — LEVOTHYROXINE SODIUM 75 MCG PO TABS: 75.0000 ug | ORAL_TABLET | Freq: Every day | ORAL | Status: DC

## 2015-10-11 NOTE — Telephone Encounter (Signed)
Levothyroxine refilled

## 2015-10-14 ENCOUNTER — Encounter: Payer: Self-pay | Admitting: Gastroenterology

## 2015-10-16 ENCOUNTER — Encounter: Payer: Self-pay | Admitting: Family Medicine

## 2015-10-18 ENCOUNTER — Other Ambulatory Visit: Payer: Self-pay | Admitting: Family Medicine

## 2015-11-04 ENCOUNTER — Encounter: Payer: Self-pay | Admitting: Internal Medicine

## 2015-11-04 ENCOUNTER — Ambulatory Visit (INDEPENDENT_AMBULATORY_CARE_PROVIDER_SITE_OTHER): Payer: Medicare HMO | Admitting: Internal Medicine

## 2015-11-04 VITALS — BP 130/80 | HR 62 | Ht 61.0 in | Wt 156.0 lb

## 2015-11-04 DIAGNOSIS — E038 Other specified hypothyroidism: Secondary | ICD-10-CM | POA: Diagnosis not present

## 2015-11-04 DIAGNOSIS — E063 Autoimmune thyroiditis: Secondary | ICD-10-CM

## 2015-11-04 DIAGNOSIS — E040 Nontoxic diffuse goiter: Secondary | ICD-10-CM | POA: Diagnosis not present

## 2015-11-04 LAB — TSH: TSH: 2.13 u[IU]/mL (ref 0.35–4.50)

## 2015-11-04 LAB — T4, FREE: Free T4: 0.95 ng/dL (ref 0.60–1.60)

## 2015-11-04 NOTE — Progress Notes (Signed)
[name], female   DOB: [birth year], 82 y.o.   MRN: [id number]   HPI  Amy Rodriguez is a 82 y.o.-year-old female, returning for f/u for hypothyroidism 2/2 to Hashimoto's thyroiditis and R goiter.  Pt. has been dx with hypothyroidism in ~2011; is on Levothyroxine 75 mcg (increased 07/2013), taken: - fasting - with water - separated by >1-2 min from b'fast  - no calcium, iron, multivitamins  - + PPIs as needed - later in the day  I reviewed pt's thyroid tests: Lab Results  Component Value Date   TSH 2.57 08/03/2015   TSH 2.47 10/20/2014   TSH 1.63 07/08/2014   TSH 1.15 12/19/2013   TSH 0.78 09/19/2013   TSH 8.120* 07/26/2013   TSH 3.56 12/17/2012   TSH 2.374 03/18/2012   TSH 15.257* 02/11/2012   TSH 0.906 12/05/2011   FREET4 1.03 07/08/2014   FREET4 1.39 03/18/2012    Component     Latest Ref Rng 07/08/2014  Thyroperoxidase Ab SerPl-aCnc     <9 IU/mL >900 (H)  This test was repeated by PCP in 10/2014 >> same result.  Pt describes: - no weight gain  - + fatigue - no cold intolerance, some hot flushes - no depression - no constipation - no dry skin - no hair loss  Enlarged right thyroid lobe - initially felt on on palpation Thyroid ultrasound (07/09/2014): enlarged right lobe, with hypoechoic aspect and moth-eaten appearance suggestive of Hashimoto's thyroiditis, and a normal left lobe. No nodules.  Pt was feeling fullness R neck >> now better, no hoarseness, no dysphagia/no odynophagia, she was feeling choking >> now also better, no SOB with lying down.  I reviewed her chart and she also has a history of HL, HTN, GERD, vit D def., bronchiectasis, TIA (small aneurysm - being followed by Dr Leonie Man.  ROS: Constitutional: + see HPI, + excessive urination Eyes: no blurry vision, no xerophthalmia ENT: no sore throat, no nodules palpated in throat, no dysphagia/odynophagia, no hoarseness, + tinnitus Cardiovascular: no CP/palpitations/leg  swelling Respiratory: + cough/+ SOB Gastrointestinal: no N/V/D/C/reflux Musculoskeletal: no muscle/+ joint aches (OA), had L TKR 2013 Skin: no rashes, no hair loss Neurological: no tremors/numbness/tingling/dizziness  I reviewed pt's medications, allergies, PMH, social hx, family hx, and changes were documented in the history of present illness. Otherwise, unchanged from my initial visit note.  Past Medical History  Diagnosis Date  . Supraventricular tachycardia, paroxysmal ([place])   . HTN (hypertension)   . Hyperlipidemia   . Postmenopausal HRT (hormone replacement therapy)   . Other seborrheic keratosis   . Vaginal pruritus   . Urinary frequency   . Diverticulosis of colon   . Blood in stool   . Pain in joint, lower leg   . Osteopenia   . IBS (irritable bowel syndrome)   . Hypothyroidism     TSH 14.488 (11/2005)  . H pylori ulcer     not treated due to expense  10/2001  . PPD positive     history +PPD 1984, no treatment, no abnormal CXR  . Posterior vitreous detachment 1996  . Multiple pigmented nevi     last derm evaluation 01/2003  . Postmenopausal     s/p hysterectomy for h/o cervical cancer 1982 (both ovaries taken at that time) now on hormonal replacement   . Shortness of breath     with ambulation  . Environmental allergies   . Lactose intolerance   . GERD (gastroesophageal reflux disease)   . Hand  dermatitis   . Cervical cancer ([place])     s/p hysterectomy/oop  . PONV (postoperative nausea and vomiting)     "and takes me a long time to come out under it" (02/08/2012)  . Pneumonia     "couple times in my lifetime" (02/08/2012)  . History of bronchitis     "w/a cold" (02/08/2012)  . Migraines     "get them very rarely now" (02/08/2012)  . OA (osteoarthritis)     multiple sites  . Arthritis     sed rate 10, RF, CCP pending  . DDD (degenerative disc disease), lumbosacral   . Osteoporosis     "borderline" (02/08/2012)  . Depression     Due to husbands passing  away 09/22/2011  . Stroke [facility])     TIA- April 2016 last one   Past Surgical History  Procedure Laterality Date  . Eye surgery      cataract removal right eye  . Colonoscopy    . Total abdominal hysterectomy w/ bilateral salpingoophorectomy  1982  . Cataract extraction w/ intraocular lens implant  ?1997    right  . Total knee arthroplasty  02/08/2012    Procedure: TOTAL KNEE ARTHROPLASTY;  Surgeon: Hessie Dibble, MD;  Location: [facility];  Service: Orthopedics;  Laterality: Left;  . Cardiovascular stress test  12/07/11    Normal nuclear stress test  . Abdominal hysterectomy  1982   Social History   Social History  . Marital Status: Married    Spouse Name: N/A  . Number of Children: 3  . Years of Education: 12TH   Occupational History  . retired     Scientist, research (medical)  .     Social History Main Topics  . Smoking status: Former Smoker -- 1.00 packs/day for 25 years    Types: Cigarettes    Quit date: 04/17/1980  . Smokeless tobacco: Former Systems developer    Quit date: 03/16/1984     Comment: passive smoker, husband smoked  . Alcohol Use: No  . Drug Use: No  . Sexual Activity: No   Other Topics Concern  . Not on file   Social History Narrative   Lives with son, [name] who has medical problems-they help each other. Her husband died October 18, 2022 after 70 years of marriage.  She has 2 other sons that live within 2 hrs from her. She does not drive-she uses [facility]. She used to work in Scientist, research (medical), retired.     Tobacco: 25 pack yr hx, quit 1985.   Current Outpatient Prescriptions on File Prior to Visit  Medication Sig Dispense Refill  . albuterol (PROVENTIL HFA;VENTOLIN HFA) 108 (90 BASE) MCG/ACT inhaler Inhale 2 puffs into the lungs every 6 (six) hours as needed for wheezing. 1 Inhaler 3  . aspirin EC 81 MG tablet Take 81 mg by mouth daily.    Marland Kitchen atenolol (TENORMIN) 25 MG tablet Take 0.5 tablets (12.5 mg total) by mouth 2 (two) times daily. 30 tablet 9  . Cyanocobalamin (VITAMIN B12 PO) Take 15 mLs  by mouth daily.     . fexofenadine (ALLEGRA) 180 MG tablet Take 1 tablet (180 mg total) by mouth daily. (Patient taking differently: Take 180 mg by mouth as needed. ) 30 tablet 11  . fluticasone (FLONASE) 50 MCG/ACT nasal spray Place 2 sprays into both nostrils daily. 16 g 2  . fluticasone-salmeterol (ADVAIR HFA) 230-21 MCG/ACT inhaler Inhale 2 puffs into the lungs 2 (two) times daily. (Patient taking differently: Inhale 2 puffs into the  lungs as needed. ) 1 Inhaler 6  . levothyroxine (SYNTHROID, LEVOTHROID) 75 MCG tablet Take 1 tablet (75 mcg total) by mouth daily before breakfast. 90 tablet 0  . losartan (COZAAR) 50 MG tablet Take 1 tablet (50 mg total) by mouth daily. 90 tablet 3  . omeprazole (PRILOSEC) 10 MG capsule Take 1 capsule (10 mg total) by mouth daily. (Patient taking differently: Take 10 mg by mouth as needed. ) 90 capsule 2  . simvastatin (ZOCOR) 10 MG tablet Take 1 tablet (10 mg total) by mouth every evening. 90 tablet 2  . Spacer/Aero-Holding Chambers (BREATHERITE COLL SPACER ADULT) MISC Use with  Albuterol Inhaler. 1 each 0  . VOLTAREN 1 % GEL   2  . ondansetron (ZOFRAN) 4 MG tablet Take 1 tablet (4 mg total) by mouth every 8 (eight) hours as needed for nausea or vomiting. (Patient not taking: Reported on 11/04/2015) 6 tablet 1   No current facility-administered medications on file prior to visit.   Allergies  Allergen Reactions  . Benzonatate Nausea And Vomiting    "tessalon pearl"  . Sulfonamide Derivatives Itching and Rash  . Amlodipine Other (See Comments)    Heart palpitations    Family History  Problem Relation Age of Onset  . Breast cancer Mother 70  . Lung cancer Brother 1  . Cancer Brother     lung  . Heart failure Father     congestive  . Heart disease Father   . Epilepsy Son   . Kidney disease Son     tuberous sclerosis, both kidneys removed, has transplant  . Diabetes Maternal Uncle   . Diabetes Paternal Aunt   . Cancer Maternal Grandmother      breast  . Heart disease Son     [place]  . COPD Son   . Colon cancer Neg Hx   . Esophageal cancer Neg Hx   . Stomach cancer Neg Hx   . Rectal cancer Maternal Aunt    PE: BP 130/80 mmHg  Pulse 62  Ht 5\' 1"  (1.549 m)  Wt 156 lb (70.761 kg)  BMI 29.49 kg/m2  SpO2 94% Body mass index is 29.49 kg/(m^2). Wt Readings from Last 3 Encounters:  11/04/15 156 lb (70.761 kg)  10/08/15 159 lb (72.122 kg)  10/07/15 158 lb (71.668 kg)   Constitutional: normal weight, in NAD Eyes: PERRLA, EOMI, no exophthalmos ENT: moist mucous membranes, + R thyromegaly, no cervical lymphadenopathy Cardiovascular: bradycardia, RR, No MRG Respiratory: CTA B Gastrointestinal: abdomen soft, NT, ND, BS+ Musculoskeletal: + deformities in fingers ([place] nodules), strength intact in all 4 Skin: moist, warm, no rashes Neurological: no tremor with outstretched hands, DTR normal in all 4  ASSESSMENT: 1. Hypothyroidism  2. Goiter - Thyroid U/S (07/09/2014):   Right thyroid lobe: 7.6 x 3.3 x 3.6 cm. The right thyroid lobe is diffusely enlarged and hypoechoic. Right thyroid tissue is mildly lobular but there is not a focal nodule. (honeycomb appearance)   Left thyroid lobe: 5.1 x 2.2 x 2.2 cm. Left thyroid tissue is heterogeneous but more hyperechoic than the right thyroid lobe and isthmus. There is not a focal nodule. Left thyroid tissue is very vascular.   Isthmus Thickness: 0.8 cm. Isthmus is prominent for size and has a similar hypoechoic appearance as the right thyroid lobe.   Lymphadenopathy None visualized.    PLAN:  1. Patient with several years of Hashimoto's hypothyroidism, on levothyroxine 75 mcg daily. She appears euthyroid.  - we reviewed her most  recent thyroid test together - they were normal 3 mo ago - will repeat the thyroid tests today - We discussed about correct intake of levothyroxine, fasting, with water, separated by at least 30 minutes from breakfast, and  separated by more than 4 hours from calcium, iron, multivitamins, acid reflux medications (PPIs). She is taking it correctly  2. Right thyroid lobe enlargement - I reviewed the images of her latest thyroid ultrasound >> large thyroid lobe shown on the ultrasound from 07/09/2014. There are no focal nodules, and the consistency is moth-eaten, consistent with Hashimoto thyroiditis. The right lobe also contains septations and a hypoechoic appearance - she feels that the size of her thyroid has decreased in size and she has no dysphagia Return in about 1 year (around 11/03/2016).  Office Visit on 11/04/2015  Component Date Value Ref Range Status  . Free T4 11/04/2015 0.95  0.60 - 1.60 ng/dL Final  . TSH 11/04/2015 2.13  0.35 - 4.50 uIU/mL Final  Normal labs.  Philemon Kingdom, MD PhD [facility]

## 2015-11-04 NOTE — Patient Instructions (Signed)
Please continue Levothyroxine 75 mcg.  Take the thyroid hormone every day, with water, at least 30 minutes before breakfast, separated by at least 4 hours from: - acid reflux medications - calcium - iron - multivitamins  Please return in 1 year.

## 2015-11-08 ENCOUNTER — Other Ambulatory Visit: Payer: Self-pay | Admitting: *Deleted

## 2015-11-08 ENCOUNTER — Encounter: Payer: Self-pay | Admitting: Cardiology

## 2015-11-08 DIAGNOSIS — E785 Hyperlipidemia, unspecified: Secondary | ICD-10-CM

## 2015-11-08 MED ORDER — SIMVASTATIN 10 MG PO TABS: 10.0000 mg | ORAL_TABLET | Freq: Every evening | ORAL | 0 refills | Status: DC

## 2015-11-08 NOTE — Telephone Encounter (Signed)
RF request for simvastatin LOV: 08/03/15 Next ov: None Last written: 01/08/15 #90 w/ 2RF

## 2015-11-18 NOTE — Progress Notes (Signed)
HPI: FU SVT. Nuclear study in August of 2013 showed an ejection fraction of 76% and normal perfusion. Holter 12/14 showed sinus with pacs, pvcs and rare couplet. Echo 4/15 showed normal LV function. Carotid dopplers 7/16 showed 1-39 right and 40-50 left stenosis. fu recommended one year. Since last seen, the patient has dyspnea with more extreme activities but not with routine activities. It is relieved with rest. It is not associated with chest pain. There is no orthopnea, PND or pedal edema. There is no syncope or palpitations. There is no exertional chest pain.   Current Outpatient Prescriptions  Medication Sig Dispense Refill  . albuterol (PROVENTIL HFA;VENTOLIN HFA) 108 (90 BASE) MCG/ACT inhaler Inhale 2 puffs into the lungs every 6 (six) hours as needed for wheezing. 1 Inhaler 3  . aspirin EC 81 MG tablet Take 81 mg by mouth daily.    Marland Kitchen atenolol (TENORMIN) 25 MG tablet Take 0.5 tablets (12.5 mg total) by mouth 2 (two) times daily. 30 tablet 9  . Cyanocobalamin (VITAMIN B12 PO) Take 15 mLs by mouth daily.     . fexofenadine (ALLEGRA) 180 MG tablet Take 1 tablet (180 mg total) by mouth daily. (Patient taking differently: Take 180 mg by mouth as needed. ) 30 tablet 11  . fluticasone (FLONASE) 50 MCG/ACT nasal spray Place 2 sprays into both nostrils daily. 16 g 2  . fluticasone-salmeterol (ADVAIR HFA) 230-21 MCG/ACT inhaler Inhale 2 puffs into the lungs 2 (two) times daily. (Patient taking differently: Inhale 2 puffs into the lungs as needed. ) 1 Inhaler 6  . levothyroxine (SYNTHROID, LEVOTHROID) 75 MCG tablet Take 1 tablet (75 mcg total) by mouth daily before breakfast. 90 tablet 0  . losartan (COZAAR) 50 MG tablet Take 1 tablet (50 mg total) by mouth daily. 90 tablet 3  . omeprazole (PRILOSEC) 10 MG capsule Take 1 capsule (10 mg total) by mouth daily. (Patient taking differently: Take 10 mg by mouth as needed. ) 90 capsule 2  . ondansetron (ZOFRAN) 4 MG tablet Take 1 tablet (4 mg total)  by mouth every 8 (eight) hours as needed for nausea or vomiting. 6 tablet 1  . simvastatin (ZOCOR) 10 MG tablet Take 1 tablet (10 mg total) by mouth every evening. 90 tablet 0  . Spacer/Aero-Holding Chambers (BREATHERITE COLL SPACER ADULT) MISC Use with  Albuterol Inhaler. 1 each 0  . VOLTAREN 1 % GEL   2   No current facility-administered medications for this visit.      Past Medical History:  Diagnosis Date  . Arthritis    sed rate 10, RF, CCP pending  . Blood in stool   . Cervical cancer ([place])    s/p hysterectomy/oop  . DDD (degenerative disc disease), lumbosacral   . Depression    Due to husbands passing away 09/22/2011  . Diverticulosis of colon   . Environmental allergies   . GERD (gastroesophageal reflux disease)   . H pylori ulcer    not treated due to expense  10/2001  . Hand dermatitis   . History of bronchitis    "w/a cold" (02/08/2012)  . HTN (hypertension)   . Hyperlipidemia   . Hypothyroidism    TSH 14.488 (11/2005)  . IBS (irritable bowel syndrome)   . Lactose intolerance   . Migraines    "get them very rarely now" (02/08/2012)  . Multiple pigmented nevi    last derm evaluation 01/2003  . OA (osteoarthritis)    multiple sites  . Osteopenia   .  Osteoporosis    "borderline" (02/08/2012)  . Other seborrheic keratosis   . Pain in joint, lower leg   . Pneumonia    "couple times in my lifetime" (02/08/2012)  . PONV (postoperative nausea and vomiting)    "and takes me a long time to come out under it" (02/08/2012)  . Posterior vitreous detachment 1996  . Postmenopausal    s/p hysterectomy for h/o cervical cancer 1982 (both ovaries taken at that time) now on hormonal replacement   . Postmenopausal HRT (hormone replacement therapy)   . PPD positive    history +PPD 1984, no treatment, no abnormal CXR  . Shortness of breath    with ambulation  . Stroke [facility])    TIA- April 2016 last one  . Supraventricular tachycardia, paroxysmal ([place])   . Urinary frequency     . Vaginal pruritus     Past Surgical History:  Procedure Laterality Date  . ABDOMINAL HYSTERECTOMY  1982  . CARDIOVASCULAR STRESS TEST  12/07/11   Normal nuclear stress test  . CATARACT EXTRACTION W/ INTRAOCULAR LENS IMPLANT  ?1997   right  . COLONOSCOPY    . EYE SURGERY     cataract removal right eye  . TOTAL ABDOMINAL HYSTERECTOMY W/ BILATERAL SALPINGOOPHORECTOMY  1982  . TOTAL KNEE ARTHROPLASTY  02/08/2012   Procedure: TOTAL KNEE ARTHROPLASTY;  Surgeon: Hessie Dibble, MD;  Location: [facility];  Service: Orthopedics;  Laterality: Left;    Social History   Social History  . Marital status: Married    Spouse name: N/A  . Number of children: 3  . Years of education: 12TH   Occupational History  . retired     Scientist, research (medical)  .  Retired   Social History Main Topics  . Smoking status: Former Smoker    Packs/day: 1.00    Years: 25.00    Types: Cigarettes    Quit date: 04/17/1980  . Smokeless tobacco: Former Systems developer    Quit date: 03/16/1984     Comment: passive smoker, husband smoked  . Alcohol use No  . Drug use: No  . Sexual activity: No   Other Topics Concern  . Not on file   Social History Narrative   Lives with son, [name] who has medical problems-they help each other. Her husband died 10/26/2022 after 3 years of marriage.  She has 2 other sons that live within 2 hrs from her. She does not drive-she uses [facility]. She used to work in Scientist, research (medical), retired.     Tobacco: 25 pack yr hx, quit 1985.    Family History  Problem Relation Age of Onset  . Breast cancer Mother 53  . Lung cancer Brother 42  . Cancer Brother     lung  . Heart failure Father     congestive  . Heart disease Father   . Epilepsy Son   . Kidney disease Son     tuberous sclerosis, both kidneys removed, has transplant  . Diabetes Maternal Uncle   . Diabetes Paternal Aunt   . Cancer Maternal Grandmother     breast  . Heart disease Son     [place]  . COPD Son   . Rectal cancer Maternal Aunt    . Colon cancer Neg Hx   . Esophageal cancer Neg Hx   . Stomach cancer Neg Hx     ROS: no fevers or chills, productive cough, hemoptysis, dysphasia, odynophagia, melena, hematochezia, dysuria, hematuria, rash, seizure activity, orthopnea, PND, pedal edema, claudication.  Remaining systems are negative.  Physical Exam: Well-developed well-nourished in no acute distress.  Skin is warm and dry.  HEENT is normal.  Neck is supple.  Chest is clear to auscultation with normal expansion.  Cardiovascular exam is regular rate and rhythm.  Abdominal exam nontender or distended. No masses palpated. Extremities show no edema. neuro grossly intact  ECG Sinus rhythm at a rate of 65. Incomplete right bundle branch block. Nonspecific T-wave changes.  A/P  1 Hypertension-blood pressure is elevated. Increase losartan to 100 mg daily. Check potassium and renal function in 1 week. Follow blood pressure and adjust regimen as needed.  2 carotid artery disease-scheduled follow-up carotid Dopplers.  3 hyperlipidemia-continue statin. Lipids and liver monitored by primary care.  4 SVT-continue low-dose beta blocker. Higher doses caused fatigue previously. No recent episodes.  Kirk Ruths, MD

## 2015-11-25 ENCOUNTER — Encounter: Payer: Self-pay | Admitting: Cardiology

## 2015-11-25 ENCOUNTER — Ambulatory Visit (INDEPENDENT_AMBULATORY_CARE_PROVIDER_SITE_OTHER): Payer: Medicare HMO | Admitting: Cardiology

## 2015-11-25 VITALS — BP 168/90 | HR 65 | Ht 61.0 in | Wt 156.0 lb

## 2015-11-25 DIAGNOSIS — I471 Supraventricular tachycardia, unspecified: Secondary | ICD-10-CM

## 2015-11-25 DIAGNOSIS — E785 Hyperlipidemia, unspecified: Secondary | ICD-10-CM

## 2015-11-25 DIAGNOSIS — I779 Disorder of arteries and arterioles, unspecified: Secondary | ICD-10-CM

## 2015-11-25 DIAGNOSIS — I10 Essential (primary) hypertension: Secondary | ICD-10-CM | POA: Diagnosis not present

## 2015-11-25 DIAGNOSIS — I739 Peripheral vascular disease, unspecified: Principal | ICD-10-CM

## 2015-11-25 MED ORDER — LOSARTAN POTASSIUM 100 MG PO TABS
100.0000 mg | ORAL_TABLET | Freq: Every day | ORAL | 11 refills | Status: DC
Start: 2015-11-25 — End: 2016-08-31

## 2015-11-25 NOTE — Patient Instructions (Signed)
Medications  Increase Losartan to 100 mg daily.   Lab work  Your physician recommends that you return for lab work in:  1 week   Procedures  Your physician has requested that you have a carotid duplex. This test is an ultrasound of the carotid arteries in your neck. It looks at blood flow through these arteries that supply the brain with blood. Allow one hour for this exam. There are no restrictions or special instructions.   Follow-up   Your physician wants you to follow-up in: 1 year with Dr. Stanford Breed. You will receive a reminder letter in the mail two months in advance. If you don't receive a letter, please call our office to schedule the follow-up appointment.  If you need a refill on your cardiac medications before your next appointment, please call your pharmacy.

## 2015-12-02 ENCOUNTER — Ambulatory Visit (HOSPITAL_COMMUNITY)
Admission: RE | Admit: 2015-12-02 | Discharge: 2015-12-02 | Disposition: A | Discharge status: Home / Self Care | Payer: Medicare HMO | Source: Ambulatory Visit | Attending: Internal Medicine | Admitting: Internal Medicine

## 2015-12-02 DIAGNOSIS — I10 Essential (primary) hypertension: Secondary | ICD-10-CM | POA: Diagnosis not present

## 2015-12-02 DIAGNOSIS — K219 Gastro-esophageal reflux disease without esophagitis: Secondary | ICD-10-CM | POA: Insufficient documentation

## 2015-12-02 DIAGNOSIS — E039 Hypothyroidism, unspecified: Secondary | ICD-10-CM | POA: Diagnosis not present

## 2015-12-02 DIAGNOSIS — E785 Hyperlipidemia, unspecified: Secondary | ICD-10-CM | POA: Diagnosis not present

## 2015-12-02 DIAGNOSIS — I779 Disorder of arteries and arterioles, unspecified: Secondary | ICD-10-CM | POA: Insufficient documentation

## 2015-12-02 DIAGNOSIS — F329 Major depressive disorder, single episode, unspecified: Secondary | ICD-10-CM | POA: Diagnosis not present

## 2015-12-02 DIAGNOSIS — I739 Peripheral vascular disease, unspecified: Secondary | ICD-10-CM

## 2015-12-02 DIAGNOSIS — I6523 Occlusion and stenosis of bilateral carotid arteries: Secondary | ICD-10-CM | POA: Diagnosis not present

## 2015-12-03 LAB — BASIC METABOLIC PANEL WITH GFR
BUN: 11 mg/dL (ref 7–25)
CALCIUM: 9 mg/dL (ref 8.6–10.4)
CO2: 25 mmol/L (ref 20–31)
CREATININE: 0.87 mg/dL (ref 0.60–0.88)
Chloride: 98 mmol/L (ref 98–110)
GFR, EST AFRICAN AMERICAN: 73 mL/min (ref >=60)
GFR, Est Non African American: 63 mL/min (ref >=60)
Glucose, Bld: 88 mg/dL (ref 65–99)
Potassium: 4.5 mmol/L (ref 3.5–5.3)
SODIUM: 133 mmol/L — AB (ref 135–146)

## 2015-12-23 ENCOUNTER — Other Ambulatory Visit: Payer: Self-pay | Admitting: *Deleted

## 2015-12-23 DIAGNOSIS — E039 Hypothyroidism, unspecified: Secondary | ICD-10-CM

## 2015-12-23 NOTE — Telephone Encounter (Signed)
Received refill request for patient levothyroxine 75 mcg. Patient was last seen here 4/17 for medicare wellness. Patient was recently seen by Dr Cruzita Lederer and TSH collected on 11/04/15 was 2.13. Are we continueing to Rx this medication or is Dr Cruzita Lederer going to be refilling this? Please advise.

## 2015-12-24 MED ORDER — LEVOTHYROXINE SODIUM 75 MCG PO TABS: 75.0000 ug | ORAL_TABLET | Freq: Every day | ORAL | 0 refills | Status: DC

## 2015-12-24 NOTE — Telephone Encounter (Signed)
Refilled thyroid medicine so she he has it, but should be refilled by Dr. Letta Median (endocrine) since she is testing and managing condition.

## 2016-01-06 ENCOUNTER — Ambulatory Visit (INDEPENDENT_AMBULATORY_CARE_PROVIDER_SITE_OTHER): Payer: Medicare HMO

## 2016-01-06 DIAGNOSIS — Z23 Encounter for immunization: Secondary | ICD-10-CM

## 2016-02-03 ENCOUNTER — Other Ambulatory Visit: Payer: Self-pay | Admitting: *Deleted

## 2016-02-03 DIAGNOSIS — E785 Hyperlipidemia, unspecified: Secondary | ICD-10-CM

## 2016-02-03 MED ORDER — SIMVASTATIN 10 MG PO TABS: 10.0000 mg | ORAL_TABLET | Freq: Every evening | ORAL | 0 refills | Status: DC

## 2016-02-07 ENCOUNTER — Other Ambulatory Visit: Payer: Self-pay

## 2016-02-07 MED ORDER — ATENOLOL 25 MG PO TABS: 12.5000 mg | ORAL_TABLET | Freq: Two times a day (BID) | ORAL | 9 refills | Status: DC

## 2016-02-29 ENCOUNTER — Other Ambulatory Visit: Payer: Self-pay | Admitting: *Deleted

## 2016-02-29 DIAGNOSIS — J0100 Acute maxillary sinusitis, unspecified: Secondary | ICD-10-CM

## 2016-02-29 MED ORDER — FLUTICASONE PROPIONATE 50 MCG/ACT NA SUSP: 2.0000 | Freq: Every day | NASAL | 2 refills | Status: DC

## 2016-04-19 ENCOUNTER — Other Ambulatory Visit: Payer: Self-pay

## 2016-04-19 MED ORDER — LEVOTHYROXINE SODIUM 75 MCG PO TABS: 75.0000 ug | ORAL_TABLET | Freq: Every day | ORAL | 0 refills | Status: DC

## 2016-04-19 NOTE — Telephone Encounter (Signed)
Refill sent. Patient to follow up for further refills.

## 2016-05-01 ENCOUNTER — Encounter: Payer: Self-pay | Admitting: Neurology

## 2016-05-01 ENCOUNTER — Ambulatory Visit (INDEPENDENT_AMBULATORY_CARE_PROVIDER_SITE_OTHER): Payer: Medicare Other | Admitting: Neurology

## 2016-05-01 ENCOUNTER — Encounter: Payer: Self-pay | Admitting: Family Medicine

## 2016-05-01 VITALS — BP 150/83 | HR 56 | Wt 159.0 lb

## 2016-05-01 DIAGNOSIS — I729 Aneurysm of unspecified site: Secondary | ICD-10-CM | POA: Insufficient documentation

## 2016-05-01 DIAGNOSIS — I671 Cerebral aneurysm, nonruptured: Secondary | ICD-10-CM

## 2016-05-01 NOTE — Patient Instructions (Signed)
I had a long discussion with the patient with regards to her remote episodes of transient paresthesias as well as the tiny asymptomatic right MCA aneurysm and answered questions. Continue aspirin for stroke prevention and strict control of hypertension with blood pressure goal below 130/90 and lipids with LDL cholesterol goal below 70 mg percent. Check follow-up repeat MRA of the brain to follow her right MCA aneurysm. Patient prefers an open MRI study. No routine follow-up visit if necessary the patient may return for follow-up as needed   Cerebral Aneurysm An aneurysm is the bulging or ballooning out of part of the weakened wall of a vein or artery. An aneurysm in the vein or artery of the brain is called a brain aneurysm, or cerebral aneurysm.  Aneurysms are a risk to your health because they may leak or rupture. Once the aneurysm leaks or ruptures, bleeding occurs. If the bleeding occurs within the brain tissue, the condition is called an intracerebral hemorrhage. An intracerebral hemorrhage can result in a hemorrhagic stroke. If the bleeding occurs in the area between the brain and the thin tissues that cover the brain, the condition is called a subarachnoid hemorrhage. This increases the pressure on the brain and causes some areas of the brain to not get the necessary blood flow. The blood from the ruptured aneurysm collects and presses on the surrounding brain tissue. A subarachnoid hemorrhage can cause a stroke. A ruptured cerebral aneurysm is a medical emergency. This can cause permanent damage and loss of brain function. CAUSES A cerebral aneurysm is caused when a weakened part of the blood vessel expands. The blood vessel expands due to the constant pressure from the flow of blood through the weakened blood vessel. Usually the aneurysm expands slowly. As the weakened aneurysm expands, the walls of the aneurysm become weaker. Aneurysms may be associated with diseases that weaken and damage the walls  of your blood vessels or blood vessels that develop abnormally. Some known causes for cerebral aneurysms are:  Head trauma.  Infection.  Use of "recreational drugs" such as cocaine or amphetamines. RISK FACTORS People at risk for a cerebral aneurysm or hemorrhagic stroke usually have one or more risk factors, which include:  Having high blood pressure (hypertension).  Abusing alcohol.  Having abnormal blood vessels present since birth.  Having certain bleeding disorders, such as hemophilia, sickle cell disease, or liver disease.  Taking blood thinners (anticoagulants).  Smoking.  Having a family history of aneurysm. SIGNS AND SYMPTOMS  The signs and symptoms of an unruptured cerebral aneurysm will partly depend on its size and rate of growth. A small, unchanging aneurysm generally does not produce symptoms. A larger aneurysm that is steadily growing can increase pressure on the brain or nerves. That increased pressure from the unruptured cerebral aneurysm can cause:  A headache.  Problems with your vision.  Numbness or weakness in an arm or leg.  Problems with memory.  Problems speaking.  Seizures. If an aneurysm leaks or bursts, it can cause a stroke and be life-threatening. Symptoms may include:  A sudden, severe headache with no known cause. The headache is often described as the worst headache ever experienced.  Nausea or vomiting, especially when combined with other symptoms such as a headache.  Sudden weakness or numbness of the face, arm, or leg, especially on one side of the body.  Sudden trouble walking or difficulty moving arms or legs.  Sudden confusion.  Sudden personality changes.  Trouble speaking (aphasia) or understanding.  Difficulty swallowing.  Sudden trouble seeing in one or both eyes.  Double vision.  Dizziness.  Loss of balance or coordination.  Intolerance to light.  Stiff neck. DIAGNOSIS  Your health care provider may use one  of the following tests to diagnose your aneurysm:  Computed tomographic angiography (CTA). This test uses dye and a scanner to produce images of your blood vessels.  Magnetic resonance angiography (MRA). This test uses an MRI machine to produce images of your blood vessels.  Digital subtraction angiography (DSA). This test uses dye and X-rays to take images of your blood vessels. Your health care provider may use this test to help determine the best course of treatment. TREATMENT  Unruptured Aneurysms  Treatment is complex when an aneurysm is found and it is not causing problems. Treatment is very individualized, as each case is different. Many things must be considered, such as the size and exact location of your aneurysm, your age, your overall health, and your feelings and preferences. Small aneurysms in certain locations of the brain have a very low chance of bleeding or rupturing. These small aneurysms may not be treated. However, depending on the size and location of the aneurysm, treatments may be recommended and include:  Coiling. During this procedure, a catheter is inserted and advanced through a blood vessel. Once the catheter reaches the aneurysm, tiny coils are used to block blood flow into the aneurysm.  Surgical clipping. During surgery, a clip is placed at the base of the aneurysm. The clip prevents blood from continuing to enter the aneurysm.  Flow diversion. This procedure is used to divert blood flow around the aneurysm. Ruptured Aneurysms  Immediate emergency surgery may be needed to help prevent damage to the brain and to reduce the risk of rebleeding. Timing of treatment is an important factor in the prevention of complications. Successful early treatment of a ruptured aneurysm (within the first 3 days of a bleed) helps to prevent rebleeding and blood vessel spasm. In some cases, there may be a reason to treat later (10-14 days after a rupture). Many things are considered when  making this decision, and each case is handled individually. HOME CARE INSTRUCTIONS  Take medicines only as instructed by your health care provider.  Eat healthy foods. It is recommended that you eat 5 or more servings of fruits and vegetables each day. Foods may need to be a special consistency (soft or pureed), or small bites may need to be taken if you have had a ruptured aneurysm or stroke. Certain dietary changes may be advised to address high blood pressure, high cholesterol, diabetes, or obesity.  Food choices that are low in salt (sodium), saturated fat, trans fat, and cholesterol are recommended to manage high blood pressure.  Food choices that are high in fiber and low in saturated fat, trans fat, and cholesterol are recommended to control cholesterol levels.  Controlling carbohydrate and sugar intake is recommended to manage diabetes.  Reducing calorie intake and making food choices that are low in sodium, saturated fat, trans fat, and cholesterol are recommended to manage obesity.  Maintain a healthy weight.  Stay physically active. It is recommended that you get at least 30 minutes of activity on most or all days.  Do not smoke.  Limit alcohol use. Moderate alcohol use is considered to be:  No more than 2 drinks each day for men.  No more than 1 drink each day for nonpregnant women.  Stop drug abuse.  A safe home environment is important to  reduce the risk of falls. Your health care provider may arrange for specialists to evaluate your home. Having grab bars in the bedroom and bathroom is often important. Your health care provider may arrange for special equipment to be used at home, such as raised toilets and a seat for the shower.  Physical, occupational, and speech therapy. Ongoing therapy may be needed to maximize your recovery after a ruptured aneurysm or stroke. If you have been advised to use a walker or a cane, use it at all times. Be sure to keep your therapy  appointments.  Follow all instructions for follow-up with your health care provider. This is very important. This includes any referrals, physical therapy, rehabilitation, and laboratory tests. Proper follow-up may prevent an aneurysm rupture or a stroke. SEEK IMMEDIATE MEDICAL CARE IF:  You have a sudden, severe headache with no known cause.  You have sudden nausea or vomiting with a severe headache.  You have sudden weakness or numbness of the face, arm, or leg, especially on one side of the body.  You have sudden trouble walking or difficulty moving arms or legs.  You have sudden confusion.  You have trouble speaking or understanding.  You have sudden trouble seeing in one or both eyes.  You have a sudden loss of balance or coordination.  You have a stiff neck.  You have difficulty breathing.  You have a partial or total loss of consciousness. Any of these symptoms may represent a serious problem that is an emergency. Do not wait to see if the symptoms will go away. Get medical help at once. Call your local emergency services (911 in [place] Do not drive yourself to the hospital.  This information is not intended to replace advice given to you by your health care provider. Make sure you discuss any questions you have with your health care provider. Document Released: 12/24/2001 Document Revised: 04/24/2014 Document Reviewed: 09/19/2012 Elsevier Interactive Patient Education  2017 [employer].

## 2016-05-01 NOTE — Progress Notes (Signed)
[facility]  PATIENT: Amy Rodriguez DOB: 05-16-1933   REASON FOR VISIT: Follow-up for transient fleeting paresthesias involving the right hand face and lips, asymptomatic right middle cerebral artery aneurysm HISTORY FROM: Patient    HISTORY OF PRESENT ILLNESS:79 year is an elderly Caucasian lady who who on 07/25/13 who developed transient episode of fleeting numbness starting in the right hand and spreading over a few minutes into right arm subsequently the face as well as left hand completely resolving within 5 minutes. Humerus were called but since her symptoms rapidly improved she chose not to go to the hospital. She called her primary care physician the next day her last ordered ER and she was hospitalized for observation and underwent MRI scan of the brain and MRA of the brain which have personally reviewed show no acute infarct but showed tiny 2 mm outpouching of the superior division of the right middle cerebral artery possibly a small asymptomatic aneurysm. Transthoracic echo showed normal ejection fraction without obvious correction of embolism. Carotid Dopplers revealed no significant obstructive stenosis. For lipid profile showed an LDL of 69. She was found to be slightly hypothyroid and temperature continued Synthroid. Hemoglobin A1c was 5.8. Patient was started on aspirin and has done well and has not had any further recurrent TIA or stroke symptoms. She has remote history of migraines and occasionally seemed jagged lights and patent but states during this episode she did not have those symptoms or any headaches. She has no prior history of strokes TIAs seizures. No prior history of significant head injury with loss of consciousness. No family history of epilepsy or seizures. Is no family history of intracranial aneurysms or subarachnoid hemorrhage. Update 07/30/14 : Amy Rodriguez, 83 year old female returns for follow-up. She was last seen in this office by Dr. Leonie Man  01/28/2014. She is doing well without recurrent episodes of paresthesias or tingling. She underwent EEG horn 11/13/2013 which was normal. She remains on aspirin . She states her blood pressure is under good control and it is 137/75 in the office today. She is tolerating Zocor well without any side effects. She complains of mild ringing in the ears as well as short-term memory difficulties but these are not progressive or bothersome. Previous MRA of the brain showed  a small asymptomatic aneurysm. She needs repeat MRA. She returns for reevaluation  Update 05/01/2016 :  She returns for follow-up after last visit in April 2016. She is doing well without recurrent stroke or TIA symptoms. She is tolerating aspirin without bleeding or bruising. She states her blood pressure is usually quite well controlled though today it is slightly elevated at 156/80 in office. Her lipid profile checked a year ago was fine but she does have an upcoming visit with primary care physician soon to check it again. She is tolerating Zocor well without muscle aches and pains. She did have follow-up MRA of the brain done on 08/21/14 which I personally reviewed showed stable appearance of 1.5 mm dilatation in the right MCA bifurcation likely small aneurysm. She denies any headaches or focal neurological symptoms. REVIEW OF SYSTEMS: Full 14 system review of systems performed and notable only for  joint pain, skin moles, ringing in the ears all others are neg:   ALLERGIES: Allergies  Allergen Reactions  . Benzonatate Nausea And Vomiting    "tessalon pearl"  . Sulfonamide Derivatives Itching and Rash  . Amlodipine Other (See Comments)    Heart palpitations     HOME MEDICATIONS: Outpatient Medications Prior  to Visit  Medication Sig Dispense Refill  . albuterol (PROVENTIL HFA;VENTOLIN HFA) 108 (90 BASE) MCG/ACT inhaler Inhale 2 puffs into the lungs every 6 (six) hours as needed for wheezing. 1 Inhaler 3  . aspirin EC 81 MG tablet Take  81 mg by mouth daily.    Marland Kitchen atenolol (TENORMIN) 25 MG tablet Take 0.5 tablets (12.5 mg total) by mouth 2 (two) times daily. 30 tablet 9  . Cyanocobalamin (VITAMIN B12 PO) Take 15 mLs by mouth daily.     . fexofenadine (ALLEGRA) 180 MG tablet Take 1 tablet (180 mg total) by mouth daily. (Patient taking differently: Take 180 mg by mouth as needed. ) 30 tablet 11  . fluticasone (FLONASE) 50 MCG/ACT nasal spray Place 2 sprays into both nostrils daily. 16 g 2  . fluticasone-salmeterol (ADVAIR HFA) 230-21 MCG/ACT inhaler Inhale 2 puffs into the lungs 2 (two) times daily. (Patient taking differently: Inhale 2 puffs into the lungs as needed. ) 1 Inhaler 6  . levothyroxine (SYNTHROID, LEVOTHROID) 75 MCG tablet Take 1 tablet (75 mcg total) by mouth daily before breakfast. Follow up visit needed for further refills. 30 tablet 0  . losartan (COZAAR) 100 MG tablet Take 1 tablet (100 mg total) by mouth daily. 30 tablet 11  . omeprazole (PRILOSEC) 10 MG capsule Take 1 capsule (10 mg total) by mouth daily. (Patient taking differently: Take 10 mg by mouth as needed. ) 90 capsule 2  . ondansetron (ZOFRAN) 4 MG tablet Take 1 tablet (4 mg total) by mouth every 8 (eight) hours as needed for nausea or vomiting. 6 tablet 1  . simvastatin (ZOCOR) 10 MG tablet Take 1 tablet (10 mg total) by mouth every evening. 90 tablet 0  . Spacer/Aero-Holding Chambers (BREATHERITE COLL SPACER ADULT) MISC Use with  Albuterol Inhaler. 1 each 0  . VOLTAREN 1 % GEL   2   No facility-administered medications prior to visit.     PAST MEDICAL HISTORY: Past Medical History:  Diagnosis Date  . Arthritis    sed rate 10, RF, CCP pending  . Blood in stool   . Cervical cancer ([place])    s/p hysterectomy/oop  . DDD (degenerative disc disease), lumbosacral   . Depression    Due to husbands passing away 09/22/2011  . Diverticulosis of colon   . Environmental allergies   . GERD (gastroesophageal reflux disease)   . H pylori ulcer    not treated  due to expense  10/2001  . Hand dermatitis   . History of bronchitis    "w/a cold" (02/08/2012)  . HTN (hypertension)   . Hyperlipidemia   . Hypothyroidism    TSH 14.488 (11/2005)  . IBS (irritable bowel syndrome)   . Lactose intolerance   . Migraines    "get them very rarely now" (02/08/2012)  . Multiple pigmented nevi    last derm evaluation 01/2003  . OA (osteoarthritis)    multiple sites  . Osteopenia   . Osteoporosis    "borderline" (02/08/2012)  . Other seborrheic keratosis   . Pain in joint, lower leg   . Pneumonia    "couple times in my lifetime" (02/08/2012)  . PONV (postoperative nausea and vomiting)    "and takes me a long time to come out under it" (02/08/2012)  . Posterior vitreous detachment 1996  . Postmenopausal    s/p hysterectomy for h/o cervical cancer 1982 (both ovaries taken at that time) now on hormonal replacement   . Postmenopausal HRT (hormone replacement therapy)   .  PPD positive    history +PPD 1984, no treatment, no abnormal CXR  . Shortness of breath    with ambulation  . Stroke [facility])    TIA- April 2016 last one  . Supraventricular tachycardia, paroxysmal ([place])   . Urinary frequency   . Vaginal pruritus     PAST SURGICAL HISTORY: Past Surgical History:  Procedure Laterality Date  . ABDOMINAL HYSTERECTOMY  1982  . CARDIOVASCULAR STRESS TEST  12/07/11   Normal nuclear stress test  . CATARACT EXTRACTION W/ INTRAOCULAR LENS IMPLANT  ?1997   right  . COLONOSCOPY    . EYE SURGERY     cataract removal right eye  . TOTAL ABDOMINAL HYSTERECTOMY W/ BILATERAL SALPINGOOPHORECTOMY  1982  . TOTAL KNEE ARTHROPLASTY  02/08/2012   Procedure: TOTAL KNEE ARTHROPLASTY;  Surgeon: Hessie Dibble, MD;  Location: [facility];  Service: Orthopedics;  Laterality: Left;    FAMILY HISTORY: Family History  Problem Relation Age of Onset  . Breast cancer Mother 49  . Lung cancer Brother 36  . Cancer Brother     lung  . Heart failure Father     congestive  .  Heart disease Father   . Epilepsy Son   . Kidney disease Son     tuberous sclerosis, both kidneys removed, has transplant  . Diabetes Maternal Uncle   . Diabetes Paternal Aunt   . Cancer Maternal Grandmother     breast  . Heart disease Son     [place] white  . COPD Son   . Rectal cancer Maternal Aunt   . Colon cancer Neg Hx   . Esophageal cancer Neg Hx   . Stomach cancer Neg Hx     SOCIAL HISTORY: Social History   Social History  . Marital status: Widowed    Spouse name: N/A  . Number of children: 3  . Years of education: 12TH   Occupational History  . retired     Scientist, research (medical)  .  Retired   Social History Main Topics  . Smoking status: Former Smoker    Packs/day: 1.00    Years: 25.00    Types: Cigarettes    Quit date: 04/17/1980  . Smokeless tobacco: Former Systems developer    Quit date: 03/16/1984     Comment: passive smoker, husband smoked  . Alcohol use No  . Drug use: No  . Sexual activity: No   Other Topics Concern  . Not on file   Social History Narrative   Lives with son, [name] who has medical problems-they help each other. Her husband died 2022/10/15 after 72 years of marriage.  She has 2 other sons that live within 2 hrs from her. She does not drive-she uses [facility]. She used to work in Scientist, research (medical), retired.     Tobacco: 25 pack yr hx, quit 1985.     PHYSICAL EXAM  Vitals:   05/01/16 1144  BP: (!) 150/83  Pulse: (!) 56  Weight: 159 lb (72.1 kg)   Body mass index is 30.04 kg/m. General: well developed, well nourished pleasant elderly Caucasian lady, seated, in no evident distress Head: head normocephalic and atraumatic. Orohparynx benign Neck: supple with no carotid or supraclavicular bruits Cardiovascular: regular rate and rhythm, no murmurs Musculoskeletal: mild kyphosis Skin: no rash/petichiae Vascular: Normal pulses all extremities Neurologic Exam Mental Status: Awake and fully alert. Oriented to place and time. Recent and remote memory intact.  Attention span, concentration and fund of knowledge appropriate. Mood and affect appropriate.  Cranial  Nerves: Fundoscopic exam not done Pupils equal, briskly reactive to light. Extraocular movements full without nystagmus. Visual fields full to confrontation. Hearing intact. Facial sensation intact. Face, tongue, palate moves normally and symmetrically.  Motor: Normal bulk and tone. Normal strength in all tested extremity muscles. Sensory.: intact to tough and pinprick and vibratory sensation.  Coordination: Rapid alternating movements normal in all extremities. Finger-to-nose and heel-to-shin performed accurately bilaterally. Gait and Station: Arises from chair without difficulty. Stance is normal. Gait demonstrates normal stride length and balance . Able to heel, toe and tandem walk without difficulty.  Reflexes: 1+ and symmetric. Toes downgoing.  DIAGNOSTIC DATA (LABS, IMAGING, TESTING) - I reviewed patient records, labs, notes, testing and imaging myself where available.  Lab Results  Component Value Date   WBC 5.8 07/08/2014   HGB 13.3 07/08/2014   HCT 39.0 07/08/2014   MCV 83.1 07/08/2014   PLT 201.0 07/08/2014      Component Value Date/Time   NA 133 (L) 12/02/2015 1304   K 4.5 12/02/2015 1304   CL 98 12/02/2015 1304   CO2 25 12/02/2015 1304   GLUCOSE 88 12/02/2015 1304   BUN 11 12/02/2015 1304   CREATININE 0.87 12/02/2015 1304   CALCIUM 9.0 12/02/2015 1304   PROT 6.8 07/08/2014 1356   ALBUMIN 4.2 07/08/2014 1356   AST 17 07/08/2014 1356   ALT 10 07/08/2014 1356   ALKPHOS 71 07/08/2014 1356   BILITOT 0.4 07/08/2014 1356   GFRNONAA 63 12/02/2015 1304   GFRAA 73 12/02/2015 1304   Lab Results  Component Value Date   CHOL 129 08/03/2015   HDL 43 (L) 08/03/2015   LDLCALC 65 08/03/2015   LDLDIRECT 68 12/05/2011   TRIG 107 08/03/2015   CHOLHDL 3.0 08/03/2015   Lab Results  Component Value Date   HGBA1C 5.4 07/07/2015   Lab Results  Component Value Date    VITAMINB12 422 07/08/2014   Lab Results  Component Value Date   TSH 2.13 11/04/2015      ASSESSMENT AND PLAN  83 y.o. year old female  has a past medical history of transient fleeting paresthesias involving the right hand, face, and lips lasting about 5 minutes in April 2015 incidental tiny asymptomatic right middle cerebral artery aneurysm on MRA. I had a long discussion with the patient with regards to her remote episodes of transient paresthesias as well as the tiny asymptomatic right MCA aneurysm and answered questions. Continue aspirin for stroke prevention and strict control of hypertension with blood pressure goal below 130/90 and lipids with LDL cholesterol goal below 70 mg percent. Check follow-up repeat MRA of the brain to follow her right MCA aneurysm. Patient prefers an open MRI study. Greater than 50% time during this 25 minute visit was spent on counseling and coordination of care about stroke and aneurysm  Bleeding risk and answering questions No routine follow-up visit if necessary the patient may return for follow-up as needed Antony Contras, MD [facility] [address], [place], [place] [address] [phone]

## 2016-05-09 ENCOUNTER — Ambulatory Visit (INDEPENDENT_AMBULATORY_CARE_PROVIDER_SITE_OTHER): Payer: Medicare Other | Admitting: Emergency Medicine

## 2016-05-09 ENCOUNTER — Encounter: Payer: Self-pay | Admitting: Emergency Medicine

## 2016-05-09 VITALS — BP 172/92 | HR 60 | Ht 61.0 in | Wt 157.4 lb

## 2016-05-09 DIAGNOSIS — J479 Bronchiectasis, uncomplicated: Secondary | ICD-10-CM | POA: Diagnosis not present

## 2016-05-09 MED ORDER — ALBUTEROL SULFATE HFA 108 (90 BASE) MCG/ACT IN AERS: 2.0000 | INHALATION_SPRAY | Freq: Four times a day (QID) | RESPIRATORY_TRACT | 3 refills | Status: DC | PRN

## 2016-05-09 NOTE — Assessment & Plan Note (Signed)
Rhinitis is still bothersome. She has been using allegra nad flonase during the Fall / Spring. Will start taking all year round.

## 2016-05-09 NOTE — Patient Instructions (Signed)
Please keep your albuterol available to use as needed. New prescription today Please start taking your allegra every day Use your fluticasone nasal spray and saline nasal sprays as needed.  Follow with Dr Lamonte Sakai in 12 months or sooner if you have any problems

## 2016-05-09 NOTE — Assessment & Plan Note (Signed)
Currently stable, good secretion clearance. No Ct chest at this time.   Please keep your albuterol available to use as needed. New prescription today Please start taking your allegra every day Use your fluticasone nasal spray and saline nasal sprays as needed.  Follow with Dr Lamonte Sakai in 12 months or sooner if you have any problems

## 2016-05-09 NOTE — Progress Notes (Signed)
Subjective:    [name], female    DOB: May 04, 1933, 83 y.o.   MRN: [id number]  HPI 83 year old woman, hx of tobacco use (25 pk-yrs), history of hypertension, SVT and emphysema based on CT 04/2014. Her PFT and CT were personally reviewed by me. Showed some RML bronchiectasis as well as emphysema. PFT 2015 show normal airflows with some evidence of obstruction after BD administration.  She has been followed by Dr Amy Rodriguez. She was being managed on Advair HFA + SABA prn. , and the Advair was stopped about a year ago. She still uses it prn. Doesn't use albuterol.   She has been dealing with a cough for the las several months, but it has cleared up in the last month, ? Allergies. Minimally productive of clear. Her BP med was changed in response to this. She is on allegra prn, fluticasone nasal spray prn.   ROV 05/09/16 -- This is a follow-up visit for patient with a history of mild obstructive lung disease, emphysematous change in bronchiectasis noted on CT scan the chest. At her last visit one year ago we tried stopping her Advair. She continued to have albuterol to use when necessary. She took her allegra through the allergy season, has uses some nasal steroid prn. She has daily cough, brings up some thick white mucous. She uses albuterol rarely, more in the summer than at other times.   Review of Systems  Constitutional: Negative for activity change, fever and unexpected weight change.  HENT: Positive for congestion, postnasal drip, rhinorrhea and sneezing.   Respiratory: Negative for cough, chest tightness, shortness of breath, wheezing and stridor.   Cardiovascular: Negative for chest pain.     Past Medical History:  Diagnosis Date  . Arthritis    sed rate 10, RF, CCP pending  . Blood in stool   . Cervical cancer ([place])    s/p hysterectomy/oop  . DDD (degenerative disc disease), lumbosacral   . Depression    Due to husbands passing away 09/22/2011  . Diverticulosis of colon    . Environmental allergies   . GERD (gastroesophageal reflux disease)   . H pylori ulcer    not treated due to expense  10/2001  . Hand dermatitis   . History of bronchitis    "w/a cold" (02/08/2012)  . HTN (hypertension)   . Hyperlipidemia   . Hypothyroidism    TSH 14.488 (11/2005)  . IBS (irritable bowel syndrome)   . Lactose intolerance   . Migraines    "get them very rarely now" (02/08/2012)  . Multiple pigmented nevi    last derm evaluation 01/2003  . OA (osteoarthritis)    multiple sites  . Osteopenia   . Osteoporosis    "borderline" (02/08/2012)  . Other seborrheic keratosis   . Pain in joint, lower leg   . Pneumonia    "couple times in my lifetime" (02/08/2012)  . PONV (postoperative nausea and vomiting)    "and takes me a long time to come out under it" (02/08/2012)  . Posterior vitreous detachment 1996  . Postmenopausal    s/p hysterectomy for h/o cervical cancer 1982 (both ovaries taken at that time) now on hormonal replacement   . Postmenopausal HRT (hormone replacement therapy)   . PPD positive    history +PPD 1984, no treatment, no abnormal CXR  . Shortness of breath    with ambulation  . Stroke [facility])    TIA- April 2016 last one  . Supraventricular tachycardia, paroxysmal (  [facility])   . Urinary frequency   . Vaginal pruritus      Family History  Problem Relation Age of Onset  . Breast cancer Mother 60  . Lung cancer Brother 75  . Cancer Brother     lung  . Heart failure Father     congestive  . Heart disease Father   . Epilepsy Son   . Kidney disease Son     tuberous sclerosis, both kidneys removed, has transplant  . Diabetes Maternal Uncle   . Diabetes Paternal Aunt   . Cancer Maternal Grandmother     breast  . Heart disease Son     [place] white  . COPD Son   . Rectal cancer Maternal Aunt   . Colon cancer Neg Hx   . Esophageal cancer Neg Hx   . Stomach cancer Neg Hx      Social History   Social History  . Marital status: Widowed     Spouse name: N/A  . Number of children: 3  . Years of education: 12TH   Occupational History  . retired     Scientist, research (medical)  .  Retired   Social History Main Topics  . Smoking status: Former Smoker    Packs/day: 1.00    Years: 25.00    Types: Cigarettes    Quit date: 04/17/1980  . Smokeless tobacco: Former Systems developer    Quit date: 03/16/1984     Comment: passive smoker, husband smoked  . Alcohol use No  . Drug use: No  . Sexual activity: No   Other Topics Concern  . Not on file   Social History Narrative   Lives with son, Amy Rodriguez who has medical problems-they help each other. Her husband died September 30, 2022 after 76 years of marriage.  She has 2 other sons that live within 2 hrs from her. She does not drive-she uses [facility]. She used to work in Scientist, research (medical), retired.     Tobacco: 25 pack yr hx, quit 1985.     Allergies  Allergen Reactions  . Benzonatate Nausea And Vomiting    "tessalon pearl"  . Sulfonamide Derivatives Itching and Rash  . Amlodipine Other (See Comments)    Heart palpitations      Outpatient Medications Prior to Visit  Medication Sig Dispense Refill  . aspirin EC 81 MG tablet Take 81 mg by mouth daily.    Marland Kitchen atenolol (TENORMIN) 25 MG tablet Take 0.5 tablets (12.5 mg total) by mouth 2 (two) times daily. 30 tablet 9  . Cyanocobalamin (VITAMIN B12 PO) Take 15 mLs by mouth daily.     . fexofenadine (ALLEGRA) 180 MG tablet Take 1 tablet (180 mg total) by mouth daily. (Patient taking differently: Take 180 mg by mouth as needed. ) 30 tablet 11  . fluticasone (FLONASE) 50 MCG/ACT nasal spray Place 2 sprays into both nostrils daily. 16 g 2  . levothyroxine (SYNTHROID, LEVOTHROID) 75 MCG tablet Take 1 tablet (75 mcg total) by mouth daily before breakfast. Follow up visit needed for further refills. 30 tablet 0  . losartan (COZAAR) 100 MG tablet Take 1 tablet (100 mg total) by mouth daily. 30 tablet 11  . omeprazole (PRILOSEC) 10 MG capsule Take 1 capsule (10 mg total) by mouth daily.  (Patient taking differently: Take 10 mg by mouth as needed. ) 90 capsule 2  . simvastatin (ZOCOR) 10 MG tablet Take 1 tablet (10 mg total) by mouth every evening. 90 tablet 0  . Spacer/Aero-Holding Chambers (BREATHERITE COLL  SPACER ADULT) MISC Use with  Albuterol Inhaler. 1 each 0  . VOLTAREN 1 % GEL   2  . albuterol (PROVENTIL HFA;VENTOLIN HFA) 108 (90 BASE) MCG/ACT inhaler Inhale 2 puffs into the lungs every 6 (six) hours as needed for wheezing. 1 Inhaler 3  . fluticasone-salmeterol (ADVAIR HFA) 230-21 MCG/ACT inhaler Inhale 2 puffs into the lungs 2 (two) times daily. (Patient not taking: Reported on 05/09/2016) 1 Inhaler 6  . ondansetron (ZOFRAN) 4 MG tablet Take 1 tablet (4 mg total) by mouth every 8 (eight) hours as needed for nausea or vomiting. (Patient not taking: Reported on 05/09/2016) 6 tablet 1   No facility-administered medications prior to visit.          Objective:   Physical Exam  Vitals:   05/09/16 1503  BP: (!) 172/92  Pulse: 60  SpO2: 94%  Weight: 157 lb 6.4 oz (71.4 kg)  Height: 5\' 1"  (1.549 m)  Gen: Pleasant, well-nourished, in no distress,  normal affect  ENT: No lesions,  mouth clear,  oropharynx clear, no postnasal drip  Neck: No JVD, no TMG, no carotid bruits  Lungs: No use of accessory muscles, clear without rales or rhonchi  Cardiovascular: brady but regular, heart sounds normal, no murmur or gallops, no peripheral edema  Musculoskeletal: No deformities, no cyanosis or clubbing  Neuro: alert, non focal  Skin: Warm, no lesions or rashes   CT scan 04/22/14 --  EXAM: CT CHEST WITHOUT CONTRAST  TECHNIQUE: Multidetector CT imaging of the chest was performed following the standard protocol without intravenous contrast. High resolution imaging of the lungs, as well as inspiratory and expiratory imaging, was performed.  COMPARISON: None.  FINDINGS: Right lobe of the thyroid is asymmetrically enlarged. Mediastinal lymph nodes are not enlarged  by CT size criteria. Hilar regions are difficult to definitively evaluate without IV contrast. No axillary adenopathy. Ascending aorta measures 3.6 cm. Pulmonary arteries are enlarged. Three-vessel coronary artery calcification. Heart is at the upper limits of normal in size. No pericardial effusion.  Centrilobular emphysema appears moderate in severity. Mild peribronchovascular nodularity in the upper lobes, right greater than left, is likely postinfectious in etiology. Mild bronchiectasis is seen in association. No subpleural reticulation, traction bronchiectasis/ bronchiolectasis, ground-glass, architectural distortion or honeycombing. No air trapping. No pleural fluid. Airway is unremarkable.  Incidental imaging of the upper abdomen shows the visualized portions of the liver, adrenal glands, kidneys, spleen and stomach to be grossly unremarkable.  No worrisome lytic or sclerotic lesions.  IMPRESSION: 1. Mild bronchiectasis is seen in association with probable post infectious scarring in the upper lobes, right greater than left. 2. Moderate centrilobular emphysema. No evidence of superimposed interstitial lung disease. 3. Ascending aorta is at the upper limits of normal in size.      Assessment & Plan:  Allergic rhinitis Rhinitis is still bothersome. She has been using allegra nad flonase during the Fall / Spring. Will start taking all year round.   Bronchiectasis without acute exacerbation Currently stable, good secretion clearance. No Ct chest at this time.   Please keep your albuterol available to use as needed. New prescription today Please start taking your allegra every day Use your fluticasone nasal spray and saline nasal sprays as needed.  Follow with Dr Lamonte Sakai in 12 months or sooner if you have any problems  Baltazar Apo, MD, PhD 05/09/2016, 3:24 PM [facility] [phone] or if no answer [phone]

## 2016-05-12 ENCOUNTER — Ambulatory Visit (INDEPENDENT_AMBULATORY_CARE_PROVIDER_SITE_OTHER): Payer: Medicare Other | Admitting: Family Medicine

## 2016-05-12 ENCOUNTER — Encounter: Payer: Self-pay | Admitting: Family Medicine

## 2016-05-12 ENCOUNTER — Telehealth: Payer: Self-pay | Admitting: Family Medicine

## 2016-05-12 VITALS — BP 147/79 | HR 53 | Temp 98.1°F | Resp 20 | Ht 61.0 in | Wt 156.5 lb

## 2016-05-12 DIAGNOSIS — E559 Vitamin D deficiency, unspecified: Secondary | ICD-10-CM

## 2016-05-12 DIAGNOSIS — E785 Hyperlipidemia, unspecified: Secondary | ICD-10-CM | POA: Diagnosis not present

## 2016-05-12 DIAGNOSIS — R7303 Prediabetes: Secondary | ICD-10-CM | POA: Diagnosis not present

## 2016-05-12 DIAGNOSIS — I10 Essential (primary) hypertension: Secondary | ICD-10-CM

## 2016-05-12 DIAGNOSIS — E038 Other specified hypothyroidism: Secondary | ICD-10-CM | POA: Diagnosis not present

## 2016-05-12 DIAGNOSIS — I729 Aneurysm of unspecified site: Secondary | ICD-10-CM

## 2016-05-12 MED ORDER — HYDRALAZINE HCL 10 MG PO TABS: 10.0000 mg | ORAL_TABLET | Freq: Three times a day (TID) | ORAL | 1 refills | Status: DC

## 2016-05-12 MED ORDER — SIMVASTATIN 10 MG PO TABS: 10.0000 mg | ORAL_TABLET | Freq: Every evening | ORAL | 3 refills | Status: DC

## 2016-05-12 MED ORDER — LEVOTHYROXINE SODIUM 75 MCG PO TABS: 75.0000 ug | ORAL_TABLET | Freq: Every day | ORAL | 1 refills | Status: DC

## 2016-05-12 NOTE — Telephone Encounter (Signed)
Please call the patient: Her visit Friday we discussed additional blood pressure medications. We discussed starting amlodipine, however in further investigation she had palpitations on this medication in the past. Therefore decided to try hydralazine very low dose which is taken 3 times a day. She is still to monitor her blood pressure daily, and reported she's having any signs of hypotension/dizziness or low blood pressures as well as any blood pressures above 140/80 routinely.

## 2016-05-12 NOTE — Progress Notes (Signed)
Amy Rodriguez , 11-May-1933, 83 y.o., female MRN: [id number] Patient Care Team    Relationship Specialty Notifications Start End  Ma Hillock, DO PCP - General Family Medicine  01/08/15   Shon Hough, MD Consulting Physician Ophthalmology  08/03/15   Collene Gobble, MD Consulting Physician Pulmonary Disease  08/03/15   Lelon Perla, MD Consulting Physician Cardiology  08/03/15   Garvin Fila, MD Consulting Physician Neurology  08/03/15   Inda Castle, MD Consulting Physician Gastroenterology  08/04/15     CC: Chronic medical conditions Subjective:  Hypertension/aneurysm/hyperlipidemia: She presents for follow-up on her hypertension today. She is currently taking losartan 100 mg daily and atenolol 12.5 mg daily.  She has a history of hyperlipidemia, she is currently prescribed Zocor 10 mg daily and is compliant with medication, she reports no negative side effects to medication. Her last lipid panel in April 2017 was well controlled. 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 Parameters of LDL below 70 and a blood pressure below 130/80 was recommended. Patient reports she averages approximately 140/80 blood pressure readings at home. She does try to monitor the salt content in her diet. She does not exercise routinely. He denies any chest pain, shortness of breath, lower extremity edema or dizziness. She does take a daily baby aspirin.  Hypothyroidsim: She follows with endocrinology for Hashimoto's thyroiditis yearly. Her last thyroid panel was in July 2017, was within normal range. She is to follow-up this coming July with her endocrinologist. She denies any constipation, diarrhea, flushing or palpitations.  Allergies  Allergen Reactions  . Benzonatate Nausea And Vomiting    "tessalon pearl"  . Sulfonamide Derivatives Itching and Rash  . Amlodipine Other (See Comments)    Heart palpitations    Social History  Substance Use Topics    . Smoking status: Former Smoker    Packs/day: 1.00    Years: 25.00    Types: Cigarettes    Quit date: 04/17/1980  . Smokeless tobacco: Former Systems developer    Quit date: 03/16/1984     Comment: passive smoker, husband smoked  . Alcohol use No   Past Medical History:  Diagnosis Date  . Arthritis    sed rate 10, RF, CCP pending  . Blood in stool   . Cervical cancer ([place])    s/p hysterectomy/oop  . DDD (degenerative disc disease), lumbosacral   . Depression    Due to husbands passing away 09/22/2011  . Diverticulosis of colon   . Environmental allergies   . GERD (gastroesophageal reflux disease)   . H pylori ulcer    not treated due to expense  10/2001  . Hand dermatitis   . History of bronchitis    "w/a cold" (02/08/2012)  . HTN (hypertension)   . Hyperlipidemia   . Hypothyroidism    TSH 14.488 (11/2005)  . IBS (irritable bowel syndrome)   . Lactose intolerance   . Migraines    "get them very rarely now" (02/08/2012)  . Multiple pigmented nevi    last derm evaluation 01/2003  . OA (osteoarthritis)    multiple sites  . Osteopenia   . Osteoporosis    "borderline" (02/08/2012)  . Other seborrheic keratosis   . Pain in joint, lower leg   . Pneumonia    "couple times in my lifetime" (02/08/2012)  . PONV (postoperative nausea and vomiting)    "and takes me a long time to come out under it" (  02/08/2012)  . Posterior vitreous detachment 1996  . Postmenopausal    s/p hysterectomy for h/o cervical cancer 1982 (both ovaries taken at that time) now on hormonal replacement   . Postmenopausal HRT (hormone replacement therapy)   . PPD positive    history +PPD 1984, no treatment, no abnormal CXR  . Shortness of breath    with ambulation  . Stroke [facility])    TIA- April 2016 last one  . Supraventricular tachycardia, paroxysmal ([place])   . Urinary frequency   . Vaginal pruritus    Past Surgical History:  Procedure Laterality Date  . ABDOMINAL HYSTERECTOMY  1982  . CARDIOVASCULAR STRESS  TEST  12/07/11   Normal nuclear stress test  . CATARACT EXTRACTION W/ INTRAOCULAR LENS IMPLANT  ?1997   right  . COLONOSCOPY    . EYE SURGERY     cataract removal right eye  . TOTAL ABDOMINAL HYSTERECTOMY W/ BILATERAL SALPINGOOPHORECTOMY  1982  . TOTAL KNEE ARTHROPLASTY  02/08/2012   Procedure: TOTAL KNEE ARTHROPLASTY;  Surgeon: Hessie Dibble, MD;  Location: [place];  Service: Orthopedics;  Laterality: Left;   Family History  Problem Relation Age of Onset  . Breast cancer Mother 32  . Lung cancer Brother 65  . Cancer Brother     lung  . Heart failure Father     congestive  . Heart disease Father   . Epilepsy Son   . Kidney disease Son     tuberous sclerosis, both kidneys removed, has transplant  . Diabetes Maternal Uncle   . Diabetes Paternal Aunt   . Cancer Maternal Grandmother     breast  . Heart disease Son     [place]  . COPD Son   . Rectal cancer Maternal Aunt   . Colon cancer Neg Hx   . Esophageal cancer Neg Hx   . Stomach cancer Neg Hx    Allergies as of 05/12/2016      Reactions   Benzonatate Nausea And Vomiting   "tessalon pearl"   Sulfonamide Derivatives Itching, Rash   Amlodipine Other (See Comments)   Heart palpitations      Medication List       Accurate as of 05/12/16  3:00 PM. Always use your most recent med list.          albuterol 108 (90 Base) MCG/ACT inhaler Commonly known as:  PROVENTIL HFA;VENTOLIN HFA Inhale 2 puffs into the lungs every 6 (six) hours as needed for wheezing.   aspirin EC 81 MG tablet Take 81 mg by mouth daily.   atenolol 25 MG tablet Commonly known as:  TENORMIN Take 0.5 tablets (12.5 mg total) by mouth 2 (two) times daily.   BREATHERITE COLL SPACER ADULT Misc Use with  Albuterol Inhaler.   fexofenadine 180 MG tablet Commonly known as:  ALLEGRA Take 1 tablet (180 mg total) by mouth daily.   fluticasone 50 MCG/ACT nasal spray Commonly known as:  FLONASE Place 2 sprays into both nostrils daily.     levothyroxine 75 MCG tablet Commonly known as:  SYNTHROID, LEVOTHROID Take 1 tablet (75 mcg total) by mouth daily before breakfast. Follow up visit needed for further refills.   losartan 100 MG tablet Commonly known as:  COZAAR Take 1 tablet (100 mg total) by mouth daily.   omeprazole 10 MG capsule Commonly known as:  PRILOSEC Take 1 capsule (10 mg total) by mouth daily.   simvastatin 10 MG tablet Commonly known as:  ZOCOR Take 1 tablet (10  mg total) by mouth every evening.   VITAMIN B12 PO Take 15 mLs by mouth daily.   VOLTAREN 1 % Gel Generic drug:  diclofenac sodium       No results found for this or any previous visit (from the past 24 hour(s)). No results found.   ROS: Negative, with the exception of above mentioned in HPI   Objective:  BP (!) 147/79 (BP Location: Right Arm, Patient Position: Sitting, Cuff Size: Normal)   Pulse (!) 53   Temp 98.1 F (36.7 C)   Resp 20   Ht 5\' 1"  (1.549 m)   Wt 156 lb 8 oz (71 kg)   SpO2 95%   BMI 29.57 kg/m  Body mass index is 29.57 kg/m. Gen: Afebrile. No acute distress. Nontoxic in appearance, well developed, well nourished.  HENT: AT. [place].MMM, no oral lesions. Eyes:Pupils Equal Round Reactive to light, Extraocular movements intact,  Conjunctiva without redness, discharge or icterus. Neck/lymp/endocrine: Supple CV: RRR, no edema Chest: CTAB, no wheeze or crackles. Good air movement, normal resp effort.  Abd: Soft. NTND. BS present.  Neuro: Normal gait. PERLA. EOMi. Alert. Oriented x3  Assessment/Plan: Amy Rodriguez is a 83 y.o. female present for OV for  Hyperlipidemia/hypertension/Aneurysm ([place]) -Cholesterol is well controlled on Zocor 10 mg daily. Will place an order for her lipid panel to be collected 2 days before her Medicare wellness/chronic follow-up in April. - Her blood pressure is mildly above goal. Discussed with her today the addition of hydralazine. She is to monitor her blood pressure at home with goal  of 130/80. If she is routinely seen her pressures at home she is to be seen immediately for medication adjustment. If she is not experiencing any signs of hypotension or dizziness, and her blood pressures are in normal range, we'll follow-up with her in April. Considered a diuretic, not ideal she already has mild hyponatremia. Considered amlodipine, patient has a reaction of palpitations to prior use of amlodipine. - Continue daily baby aspirin. - Continue atenolol 12.5 mg twice a day. - Continue Cozaar 100 mg daily - Dosed hydralazine 10 mg 3 times a day. - Follow-up in April.  hypothyroidism - Refills on levothyroxine 75 g daily provided today to last through July. At that time she should have her endocrinologist, that sees her for this condition prescribed the medications. Therefore she will have to follow-up in 2 places for this issue.  electronically signed by:  Howard Pouch, DO  [place]

## 2016-05-12 NOTE — Patient Instructions (Signed)
Start amlodipine 2.5 mg daily and take BP as long as around 130/80 and feeling ok, will follow up at the  Next appt (April) for this.    Remember to schedule medicare wellness with a follow up with me in April and have labs 2 days prior.

## 2016-05-15 NOTE — Telephone Encounter (Signed)
Spoke with patient reviewed information and instructions. Patient verbalized understanding. 

## 2016-05-22 ENCOUNTER — Telehealth: Payer: Self-pay

## 2016-05-22 ENCOUNTER — Ambulatory Visit
Admission: RE | Admit: 2016-05-22 | Discharge: 2016-05-22 | Disposition: A | Discharge status: Home / Self Care | Payer: Medicare Other | Source: Ambulatory Visit | Attending: Neurology | Admitting: Neurology

## 2016-05-22 DIAGNOSIS — I671 Cerebral aneurysm, nonruptured: Secondary | ICD-10-CM

## 2016-05-22 NOTE — Telephone Encounter (Signed)
Left vm for son Amy Rodriguez on dpr to call back about his moms mra results

## 2016-05-22 NOTE — Telephone Encounter (Signed)
-----   Message from Garvin Fila, MD sent at 05/22/2016  5:05 PM EST ----- Kindly inform the patient that MRA of the brain showed no significant change from previous study from 08/21/2014. No new or worrisome findings

## 2016-05-23 NOTE — Telephone Encounter (Signed)
Left vm for patients son on vm to call about his moms MRA of the brain.

## 2016-05-25 ENCOUNTER — Encounter: Payer: Self-pay | Admitting: Cardiology

## 2016-05-25 DIAGNOSIS — I10 Essential (primary) hypertension: Secondary | ICD-10-CM

## 2016-05-26 ENCOUNTER — Encounter: Payer: Self-pay | Admitting: *Deleted

## 2016-05-26 MED ORDER — HYDROCHLOROTHIAZIDE 25 MG PO TABS: 25.0000 mg | ORAL_TABLET | Freq: Two times a day (BID) | ORAL | 12 refills | Status: DC

## 2016-05-26 NOTE — Telephone Encounter (Signed)
Rn spoke with pts daughter in law Pam about MRA of brain results. Rn stated the MRA of brain showed no significant change from previous study on 08/21/2014. No new or worrisome findings. Pam verbalized understanding. ------

## 2016-05-29 ENCOUNTER — Other Ambulatory Visit: Payer: Self-pay | Admitting: *Deleted

## 2016-05-29 DIAGNOSIS — I10 Essential (primary) hypertension: Secondary | ICD-10-CM

## 2016-05-31 ENCOUNTER — Telehealth: Payer: Self-pay | Admitting: Emergency Medicine

## 2016-05-31 NOTE — Telephone Encounter (Signed)
Went on [email or web address] to check the status of the PA for the ventolin HFA.  This has been denied due to the insurance will not cover this medication.  RB please advise of any other alternatives.  thanks

## 2016-06-01 MED ORDER — ALBUTEROL SULFATE HFA 108 (90 BASE) MCG/ACT IN AERS: 2.0000 | INHALATION_SPRAY | RESPIRATORY_TRACT | 3 refills | Status: DC | PRN

## 2016-06-01 NOTE — Telephone Encounter (Signed)
Rx for Proair will be sent in to see if this is covered. Nothing further was needed.

## 2016-06-01 NOTE — Telephone Encounter (Signed)
It would be Ok for [place] to substitute any version of albuterol that her insurance formulary prefers - either ProAir or proventil.

## 2016-06-13 DIAGNOSIS — I10 Essential (primary) hypertension: Secondary | ICD-10-CM | POA: Diagnosis not present

## 2016-06-14 LAB — BASIC METABOLIC PANEL
BUN: 13 mg/dL (ref 7–25)
CHLORIDE: 93 mmol/L — AB (ref 98–110)
CO2: 26 mmol/L (ref 20–31)
Calcium: 9.7 mg/dL (ref 8.6–10.4)
Creat: 0.88 mg/dL (ref 0.60–0.88)
Glucose, Bld: 86 mg/dL (ref 65–99)
POTASSIUM: 4.3 mmol/L (ref 3.5–5.3)
SODIUM: 132 mmol/L — AB (ref 135–146)

## 2016-07-06 ENCOUNTER — Encounter: Payer: Self-pay | Admitting: Family Medicine

## 2016-07-07 ENCOUNTER — Other Ambulatory Visit (INDEPENDENT_AMBULATORY_CARE_PROVIDER_SITE_OTHER): Payer: Medicare Other

## 2016-07-07 ENCOUNTER — Encounter: Payer: Self-pay | Admitting: Pulmonary Disease

## 2016-07-07 ENCOUNTER — Ambulatory Visit (INDEPENDENT_AMBULATORY_CARE_PROVIDER_SITE_OTHER): Payer: Medicare Other | Admitting: Pulmonary Disease

## 2016-07-07 ENCOUNTER — Ambulatory Visit (INDEPENDENT_AMBULATORY_CARE_PROVIDER_SITE_OTHER)
Admission: RE | Admit: 2016-07-07 | Discharge: 2016-07-07 | Disposition: A | Discharge status: Home / Self Care | Payer: Medicare Other | Source: Ambulatory Visit | Attending: Pulmonary Disease | Admitting: Pulmonary Disease

## 2016-07-07 DIAGNOSIS — J209 Acute bronchitis, unspecified: Secondary | ICD-10-CM | POA: Diagnosis not present

## 2016-07-07 DIAGNOSIS — E785 Hyperlipidemia, unspecified: Secondary | ICD-10-CM | POA: Diagnosis not present

## 2016-07-07 DIAGNOSIS — R05 Cough: Secondary | ICD-10-CM | POA: Diagnosis not present

## 2016-07-07 DIAGNOSIS — J471 Bronchiectasis with (acute) exacerbation: Secondary | ICD-10-CM | POA: Insufficient documentation

## 2016-07-07 DIAGNOSIS — R0602 Shortness of breath: Secondary | ICD-10-CM | POA: Diagnosis not present

## 2016-07-07 LAB — BASIC METABOLIC PANEL
BUN: 10 mg/dL (ref 6–23)
CALCIUM: 9 mg/dL (ref 8.4–10.5)
CO2: 27 mEq/L (ref 19–32)
Chloride: 88 mEq/L — ABNORMAL LOW (ref 96–112)
Creatinine, Ser: 0.88 mg/dL (ref 0.40–1.20)
GFR: 65.61 mL/min (ref >60.00)
Glucose, Bld: 110 mg/dL — ABNORMAL HIGH (ref 70–99)
Potassium: 4.6 mEq/L (ref 3.5–5.1)
Sodium: 124 mEq/L — ABNORMAL LOW (ref 135–145)

## 2016-07-07 LAB — HEPATIC FUNCTION PANEL
ALBUMIN: 4 g/dL (ref 3.5–5.2)
ALT: 10 U/L (ref 0–35)
AST: 14 U/L (ref 0–37)
Alkaline Phosphatase: 73 U/L (ref 39–117)
BILIRUBIN TOTAL: 0.9 mg/dL (ref 0.2–1.2)
Bilirubin, Direct: 0.2 mg/dL (ref 0.0–0.3)
Total Protein: 7.4 g/dL (ref 6.0–8.3)

## 2016-07-07 LAB — CBC WITH DIFFERENTIAL/PLATELET
Basophils Absolute: 0.1 10*3/uL (ref 0.0–0.1)
Basophils Relative: 0.4 % (ref 0.0–3.0)
EOS PCT: 0.3 % (ref 0.0–5.0)
Eosinophils Absolute: 0 10*3/uL (ref 0.0–0.7)
HEMATOCRIT: 37.3 % (ref 36.0–46.0)
Hemoglobin: 12.9 g/dL (ref 12.0–15.0)
LYMPHS PCT: 13.7 % (ref 12.0–46.0)
Lymphs Abs: 1.9 10*3/uL (ref 0.7–4.0)
MCHC: 34.5 g/dL (ref 30.0–36.0)
MCV: 82.3 fl (ref 78.0–100.0)
Monocytes Absolute: 1.8 10*3/uL — ABNORMAL HIGH (ref 0.1–1.0)
Monocytes Relative: 13.3 % — ABNORMAL HIGH (ref 3.0–12.0)
NEUTROS ABS: 10 10*3/uL — AB (ref 1.4–7.7)
Neutrophils Relative %: 72.3 % (ref 43.0–77.0)
PLATELETS: 243 10*3/uL (ref 150.0–400.0)
RBC: 4.53 Mil/uL (ref 3.87–5.11)
RDW: 12.6 % (ref 11.5–15.5)
WBC: 13.8 10*3/uL — ABNORMAL HIGH (ref 4.0–10.5)

## 2016-07-07 MED ORDER — ONDANSETRON HCL 4 MG PO TABS: 4.0000 mg | ORAL_TABLET | Freq: Three times a day (TID) | ORAL | 0 refills | Status: DC | PRN

## 2016-07-07 MED ORDER — LEVOFLOXACIN 500 MG PO TABS: 500.0000 mg | ORAL_TABLET | Freq: Every day | ORAL | 0 refills | Status: DC

## 2016-07-07 NOTE — Patient Instructions (Signed)
Blood work and chest x-ray today  antibiotic for 7 days Nausea medication every 8 hours as needed

## 2016-07-07 NOTE — Addendum Note (Signed)
Addended by: Valerie Salts on: 07/07/2016 03:21 PM   Modules accepted: Orders

## 2016-07-07 NOTE — Progress Notes (Signed)
Subjective:    [name], female    DOB: 01-22-1934, 83 y.o.   MRN: [id number]  HPI 83 y.o remote smoker with history of mild obstructive lung disease, emphysematous change in bronchiectasis noted on CT scan the chest.  Chief Complaint  Patient presents with  . Acute Visit    Developed a bad cough and chills last Friday 3/16. Headache. Temp off and on. Semi-productive cough with thick, yellowish/green mucus.    She saw our be for her routine follow-up in January. For the past week she reports onset of fever with chills followed by cough initially clear sputum but now greenish, denies wheezing or shortness of breath but does have bifrontal headaches with nausea. At the sight of food. She has been unable to eat much for the last 3 days. She did not have a ride and could not come in until today she denies burning micturition or change in bowel/bladder habits.  Past Medical History:  Diagnosis Date  . Arthritis    sed rate 10, RF, CCP pending  . Blood in stool   . Cervical cancer ([place])    s/p hysterectomy/oop  . DDD (degenerative disc disease), lumbosacral   . Depression    Due to husbands passing away 09/22/2011  . Diverticulosis of colon   . Environmental allergies   . GERD (gastroesophageal reflux disease)   . H pylori ulcer    not treated due to expense  10/2001  . Hand dermatitis   . History of bronchitis    "w/a cold" (02/08/2012)  . HTN (hypertension)   . Hyperlipidemia   . Hypothyroidism    TSH 14.488 (11/2005)  . IBS (irritable bowel syndrome)   . Lactose intolerance   . Migraines    "get them very rarely now" (02/08/2012)  . Multiple pigmented nevi    last derm evaluation 01/2003  . OA (osteoarthritis)    multiple sites  . Osteopenia   . Osteoporosis    "borderline" (02/08/2012)  . Other seborrheic keratosis   . Pain in joint, lower leg   . Pneumonia    "couple times in my lifetime" (02/08/2012)  . PONV (postoperative nausea and vomiting)    "and takes me a long time to come out under it" (02/08/2012)  . Posterior vitreous detachment 1996  . Postmenopausal    s/p hysterectomy for h/o cervical cancer 1982 (both ovaries taken at that time) now on hormonal replacement   . Postmenopausal HRT (hormone replacement therapy)   . PPD positive    history +PPD 1984, no treatment, no abnormal CXR  . Shortness of breath    with ambulation  . Stroke [facility])    TIA- April 2016 last one  . Supraventricular tachycardia, paroxysmal ([place])   . Urinary frequency   . Vaginal pruritus      Review of Systems neg for any significant sore throat, dysphagia, itching, sneezing, nasal congestion or excess/ purulent secretions,  sweats, unintended wt loss, pleuritic or exertional cp, hempoptysis, orthopnea pnd or change in chronic leg swelling.   Also denies presyncope, palpitations, heartburn, abdominal pain, nausea, vomiting, diarrhea or change in bowel or urinary habits, dysuria,hematuria, rash, arthralgias, visual complaints,  numbness weakness or ataxia.     Objective:   Physical Exam  Gen. Pleasant, well-nourished, in no distress ENT - no lesions, no post nasal drip Neck: No JVD, no thyromegaly, no carotid bruits Lungs: no use of accessory muscles, no dullness to percussion, mild kyphosis, clear without rales or  rhonchi  Cardiovascular: Rhythm regular, heart sounds  normal, no murmurs or gallops, no peripheral edema Musculoskeletal: No deformities, no cyanosis or clubbing        Assessment & Plan:

## 2016-07-07 NOTE — Assessment & Plan Note (Signed)
Given her prior history, we'll be aggressive and treat with Levaquin 500 milligrams 7 days. This could be viral onset but now with green sputum and appears to be bacterial superinfection. We'll not check for the flu since symptoms have been ongoing for 7 days and she has defervesced  Obtain chest x-ray to rule out pneumonia

## 2016-07-07 NOTE — Assessment & Plan Note (Signed)
Given persistent nausea, we'll check LFTs since she is also on a statin Zofran 4 mg every 8 when necessary

## 2016-07-10 ENCOUNTER — Telehealth: Payer: Self-pay | Admitting: Pulmonary Disease

## 2016-07-10 NOTE — Telephone Encounter (Signed)
Notes recorded by Amy Noel, MD on 07/07/2016 at 5:11 PM EDT Chest x-ray did suggest pneumonia WBC count was high consistent with infection and sodium level was low - please ask her to stop taking thiazide diuretic, take salt tablets of extra salt in diet for next few days Please make follow-up appointment with Dr. Lamonte Sakai in 1 week for repeat sodium check ------------ Spoke with pt, aware of recs.  RB has no openings this week, scheduled to see MW on Thursday for recheck.  Pt also noted some nausea while taking abx- pt noted that she is taking abx on an empty stomach.  I advised pt to take med with a meal and this should help to improve her nausea.  Pt expressed understanding.  Will close encounter.

## 2016-07-13 ENCOUNTER — Ambulatory Visit (INDEPENDENT_AMBULATORY_CARE_PROVIDER_SITE_OTHER): Payer: Medicare Other | Admitting: Adult Health

## 2016-07-13 ENCOUNTER — Ambulatory Visit: Payer: Medicare Other | Admitting: Internal Medicine

## 2016-07-13 ENCOUNTER — Ambulatory Visit (INDEPENDENT_AMBULATORY_CARE_PROVIDER_SITE_OTHER)
Admission: RE | Admit: 2016-07-13 | Discharge: 2016-07-13 | Disposition: A | Discharge status: Home / Self Care | Payer: Medicare Other | Source: Ambulatory Visit | Attending: Adult Health | Admitting: Adult Health

## 2016-07-13 ENCOUNTER — Other Ambulatory Visit (INDEPENDENT_AMBULATORY_CARE_PROVIDER_SITE_OTHER): Payer: Medicare Other

## 2016-07-13 ENCOUNTER — Encounter: Payer: Self-pay | Admitting: Adult Health

## 2016-07-13 VITALS — BP 116/72 | HR 66 | Temp 97.9°F | Ht 61.0 in | Wt 154.0 lb

## 2016-07-13 DIAGNOSIS — J181 Lobar pneumonia, unspecified organism: Secondary | ICD-10-CM | POA: Diagnosis not present

## 2016-07-13 DIAGNOSIS — J189 Pneumonia, unspecified organism: Secondary | ICD-10-CM

## 2016-07-13 DIAGNOSIS — E871 Hypo-osmolality and hyponatremia: Secondary | ICD-10-CM

## 2016-07-13 LAB — CBC WITH DIFFERENTIAL/PLATELET
BASOS PCT: 1.4 % (ref 0.0–3.0)
Basophils Absolute: 0.1 10*3/uL (ref 0.0–0.1)
EOS ABS: 0.2 10*3/uL (ref 0.0–0.7)
Eosinophils Relative: 2.2 % (ref 0.0–5.0)
HEMATOCRIT: 38.4 % (ref 36.0–46.0)
Hemoglobin: 13.3 g/dL (ref 12.0–15.0)
LYMPHS ABS: 1.9 10*3/uL (ref 0.7–4.0)
LYMPHS PCT: 23.7 % (ref 12.0–46.0)
MCHC: 34.7 g/dL (ref 30.0–36.0)
MCV: 81.2 fl (ref 78.0–100.0)
MONO ABS: 1 10*3/uL (ref 0.1–1.0)
Monocytes Relative: 12.4 % — ABNORMAL HIGH (ref 3.0–12.0)
NEUTROS ABS: 4.8 10*3/uL (ref 1.4–7.7)
Neutrophils Relative %: 60.3 % (ref 43.0–77.0)
PLATELETS: 379 10*3/uL (ref 150.0–400.0)
RBC: 4.73 Mil/uL (ref 3.87–5.11)
RDW: 12.8 % (ref 11.5–15.5)
WBC: 8 10*3/uL (ref 4.0–10.5)

## 2016-07-13 LAB — BASIC METABOLIC PANEL
BUN: 10 mg/dL (ref 6–23)
CHLORIDE: 90 meq/L — AB (ref 96–112)
CO2: 27 meq/L (ref 19–32)
CREATININE: 0.93 mg/dL (ref 0.40–1.20)
Calcium: 8.9 mg/dL (ref 8.4–10.5)
GFR: 61.55 mL/min (ref >60.00)
GLUCOSE: 99 mg/dL (ref 70–99)
Potassium: 4.4 mEq/L (ref 3.5–5.1)
Sodium: 125 mEq/L — ABNORMAL LOW (ref 135–145)

## 2016-07-13 MED ORDER — AZITHROMYCIN 250 MG PO TABS: ORAL_TABLET | ORAL | 0 refills | Status: AC

## 2016-07-13 NOTE — Patient Instructions (Addendum)
Continue on current regimen .  Chest xray today  Labs today  I will call with results.  Follow up with Dr. Lamonte Sakai  In 2-3 months and As needed   Please contact office for sooner follow up if symptoms do not improve or worsen or seek emergency care

## 2016-07-13 NOTE — Progress Notes (Signed)
@Patient  ID: Amy Rodriguez, female    DOB: 11/15/1933, 83 y.o.   MRN: [id number]  Chief Complaint  Patient presents with  . Follow-up    PNA     Referring provider: Ma Hillock, DO  HPI: 83 yo female with remote smoking hx followed for Mild COPD, Bronchiectasis , Emphysema .    07/20/2016 Follow up : COPD /PNA  Pt returns for a 1 week follow up . She was seen last ov with increased cough and congestion . CXR showed a RUL PNA . She was started on Levaquin . She says she could only take a couple of days of this , it made her sick. She is feeling better , starting to eat better.  Her congestion is decreased and cough is gone. Energy level is started to pick up but is still weak. No fever or hemoptyiss .  Labs did show low Na level at 124. WBC was elevated at 13K .    Allergies  Allergen Reactions  . Benzonatate Nausea And Vomiting    "tessalon pearl"  . Sulfonamide Derivatives Itching and Rash  . Hctz [Hydrochlorothiazide] Other (See Comments)    Severe hyponatremia   . Amlodipine Other (See Comments)    Heart palpitations     Immunization History  Administered Date(s) Administered  . Influenza Split 01/12/2011, 02/09/2012  . Influenza Whole 02/08/2007, 01/24/2008, 12/16/2008, 01/27/2009, 02/17/2010  . Influenza,inj,Quad PF,36+ Mos 12/17/2012, 12/19/2013, 01/08/2015, 01/06/2016  . Pneumococcal Conjugate-13 07/08/2014  . Pneumococcal Polysaccharide-23 05/23/2004  . Td 06/16/2002  . Tdap 12/17/2012  . Zoster 12/17/2012    Past Medical History:  Diagnosis Date  . Arthritis    sed rate 10, RF, CCP pending  . Blood in stool   . Cervical cancer ([place])    s/p hysterectomy/oop  . DDD (degenerative disc disease), lumbosacral   . Depression    Due to husbands passing away 09/22/2011  . Diverticulosis of colon   . Environmental allergies   . GERD (gastroesophageal reflux disease)   . H pylori ulcer    not treated due to expense  10/2001  . Hand dermatitis   . History  of bronchitis    "w/a cold" (02/08/2012)  . HTN (hypertension)   . Hyperlipidemia   . Hypothyroidism    TSH 14.488 (11/2005)  . IBS (irritable bowel syndrome)   . Lactose intolerance   . Migraines    "get them very rarely now" (02/08/2012)  . Multiple pigmented nevi    last derm evaluation 01/2003  . OA (osteoarthritis)    multiple sites  . Osteopenia   . Osteoporosis    "borderline" (02/08/2012)  . Other seborrheic keratosis   . Pain in joint, lower leg   . Pneumonia    "couple times in my lifetime" (02/08/2012)  . PONV (postoperative nausea and vomiting)    "and takes me a long time to come out under it" (02/08/2012)  . Posterior vitreous detachment 1996  . Postmenopausal    s/p hysterectomy for h/o cervical cancer 1982 (both ovaries taken at that time) now on hormonal replacement   . Postmenopausal HRT (hormone replacement therapy)   . PPD positive    history +PPD 1984, no treatment, no abnormal CXR  . Shortness of breath    with ambulation  . Stroke [facility])    TIA- April 2016 last one  . Supraventricular tachycardia, paroxysmal ([place])   . Urinary frequency   . Vaginal pruritus     Tobacco History: History  Smoking Status  . Former Smoker  . Packs/day: 1.00  . Years: 25.00  . Types: Cigarettes  . Quit date: 04/17/1980  Smokeless Tobacco  . Former Systems developer  . Quit date: 03/16/1984    Comment: passive smoker, husband smoked   Counseling given: Not Answered   Outpatient Encounter Prescriptions as of 07/13/2016  Medication Sig  . albuterol (PROAIR HFA) 108 (90 Base) MCG/ACT inhaler Inhale 2 puffs into the lungs every 4 (four) hours as needed for wheezing or shortness of breath.  Marland Kitchen aspirin EC 81 MG tablet Take 81 mg by mouth daily.  Marland Kitchen atenolol (TENORMIN) 25 MG tablet Take 0.5 tablets (12.5 mg total) by mouth 2 (two) times daily.  . Cyanocobalamin (VITAMIN B12 PO) Take 15 mLs by mouth daily.   . fexofenadine (ALLEGRA) 180 MG tablet Take 1 tablet (180 mg total) by mouth  daily. (Patient taking differently: Take 180 mg by mouth as needed. )  . fluticasone (FLONASE) 50 MCG/ACT nasal spray Place 2 sprays into both nostrils daily.  Marland Kitchen levothyroxine (SYNTHROID, LEVOTHROID) 75 MCG tablet Take 1 tablet (75 mcg total) by mouth daily before breakfast. Follow up visit needed for further refills.  Marland Kitchen losartan (COZAAR) 100 MG tablet Take 1 tablet (100 mg total) by mouth daily.  Marland Kitchen omeprazole (PRILOSEC) 10 MG capsule Take 1 capsule (10 mg total) by mouth daily. (Patient taking differently: Take 10 mg by mouth as needed. )  . ondansetron (ZOFRAN) 4 MG tablet Take 1 tablet (4 mg total) by mouth every 8 (eight) hours as needed for nausea or vomiting.  . simvastatin (ZOCOR) 10 MG tablet Take 1 tablet (10 mg total) by mouth every evening.  Marland Kitchen Spacer/Aero-Holding Chambers (BREATHERITE COLL SPACER ADULT) MISC Use with  Albuterol Inhaler.  . VOLTAREN 1 % GEL   . [DISCONTINUED] hydrochlorothiazide (HYDRODIURIL) 25 MG tablet Take 1 tablet (25 mg total) by mouth 2 (two) times daily. (Patient not taking: Reported on 07/19/2016)  . [EXPIRED] azithromycin (ZITHROMAX Z-PAK) 250 MG tablet Take 2 tablets (500 mg) on  Day 1,  followed by 1 tablet (250 mg) once daily on Days 2 through 5.  . [DISCONTINUED] levofloxacin (LEVAQUIN) 500 MG tablet Take 1 tablet (500 mg total) by mouth daily. (Patient not taking: Reported on 07/13/2016)   No facility-administered encounter medications on file as of 07/13/2016.      Review of Systems  Constitutional:   No  weight loss, night sweats,  Fevers, chills,  +fatigue, or  lassitude.  HEENT:   No headaches,  Difficulty swallowing,  Tooth/dental problems, or  Sore throat,                No sneezing, itching, ear ache, nasal congestion, post nasal drip,   CV:  No chest pain,  Orthopnea, PND, swelling in lower extremities, anasarca, dizziness, palpitations, syncope.   GI  No heartburn, indigestion, abdominal pain, nausea, vomiting, diarrhea, change in bowel  habits, loss of appetite, bloody stools.   Resp:   No chest wall deformity  Skin: no rash or lesions.  GU: no dysuria, change in color of urine, no urgency or frequency.  No flank pain, no hematuria   MS:  No joint pain or swelling.  No decreased range of motion.  No back pain.    Physical Exam  BP 116/72 (BP Location: Left Arm, Cuff Size: Normal)   Pulse 66   Temp 97.9 F (36.6 C) (Oral)   Ht 5\' 1"  (1.549 m)   Wt 154 lb (69.9  kg)   SpO2 97%   BMI 29.10 kg/m   GEN: A/Ox3; pleasant , NAD,  Elderly    HEENT:  [place]/AT,  EACs-clear, TMs-wnl, NOSE-clear, THROAT-clear, no lesions, no postnasal drip or exudate noted.   NECK:  Supple w/ fair ROM; no JVD; normal carotid impulses w/o bruits; no thyromegaly or nodules palpated; no lymphadenopathy.    RESP  Decreased BS in bases , no dullness to percussion  CARD:  RRR, no m/r/g, no peripheral edema, pulses intact, no cyanosis or clubbing.  GI:   Soft & nt; nml bowel sounds; no organomegaly or masses detected.   Musco: Warm bil, no deformities or joint swelling noted.   Neuro: alert, no focal deficits noted.    Skin: Warm, no lesions or rashes    Lab Results:  CBC    Component Value Date/Time   WBC 8.0 07/13/2016 1716   RBC 4.73 07/13/2016 1716   HGB 13.3 07/13/2016 1716   HCT 38.4 07/13/2016 1716   PLT 379.0 07/13/2016 1716   MCV 81.2 07/13/2016 1716   MCH 28.6 03/01/2014 1612   MCHC 34.7 07/13/2016 1716   RDW 12.8 07/13/2016 1716   LYMPHSABS 1.9 07/13/2016 1716   MONOABS 1.0 07/13/2016 1716   EOSABS 0.2 07/13/2016 1716   BASOSABS 0.1 07/13/2016 1716    BMET    Component Value Date/Time   NA 132 (L) 07/19/2016 1534   K 5.0 07/19/2016 1534   CL 98 07/19/2016 1534   CO2 23 07/19/2016 1534   GLUCOSE 99 07/19/2016 1534   BUN 10 07/19/2016 1534   CREATININE 0.86 07/19/2016 1534   CALCIUM 9.1 07/19/2016 1534   GFRNONAA 64 07/19/2016 1534   GFRAA 74 07/19/2016 1534    BNP No results found for:  BNP  ProBNP No results found for: PROBNP  Imaging: Dg Chest 2 View  Result Date: 07/13/2016 CLINICAL DATA:  Follow-up pneumonia. EXAM: CHEST  2 VIEW COMPARISON:  07/07/2016.  CT 04/22/2014 . FINDINGS: Mediastinum and hilar structures normal. Cardiomegaly. Partial clearing of right upper lobe infiltrate with mild residual. Continued follow-up exams suggested to demonstrate clearing to exclude a persistent lesion. Bilateral pleural-parenchymal thickening noted consistent with scarring. No pneumothorax. Bilateral carotid calcification. IMPRESSION: 1. Partial clearing of right upper lobe infiltrate with mild residual. Continued follow-up exams suggested to exclude underlying persistent lesion. 2.  Bilateral pleural-parenchymal scarring. 3. Stable cardiomegaly . 4.  Bilateral carotid atherosclerotic vascular disease . Electronically Signed   By: Marcello Moores  Register   On: 07/13/2016 17:21   Dg Chest 2 View  Result Date: 07/07/2016 CLINICAL DATA:  Productive cough and shortness of Breath EXAM: CHEST  2 VIEW COMPARISON:  04/22/2014 FINDINGS: Cardiac shadow is within normal limits. The lungs are hyperinflated with evidence of diffuse interstitial scarring consistent with COPD. Increased density is noted in the right upper lobe along the minor fissure which may represent some acute on chronic change. No sizable effusion is seen. No bony abnormality is noted. IMPRESSION: New changes projecting anteriorly and likely within the right upper lobe which may represent some acute on chronic change. Followup PA and lateral chest X-ray is recommended in 3-4 weeks following trial of antibiotic therapy to ensure resolution and exclude underlying malignancy. Electronically Signed   By: Inez Catalina M.D.   On: 07/07/2016 16:09     Assessment & Plan:   CAP (community acquired pneumonia) RUL PNA - clinically improving despite not completing full course. (only 2 days most likely not enough)   Will check xray ,  if still  present , will add zpack   Plan  Patient Instructions  Continue on current regimen .  Chest xray today  Labs today  I will call with results.  Follow up with Dr. Lamonte Sakai  In 2-3 months and As needed   Please contact office for sooner follow up if symptoms do not improve or worsen or seek emergency care       Hyponatremia Recheck BMET looking at sodium.

## 2016-07-18 ENCOUNTER — Telehealth: Payer: Self-pay | Admitting: Adult Health

## 2016-07-18 NOTE — Progress Notes (Signed)
Spoke with pt and notified of results per Dr. Wert. Pt verbalized understanding and denied any questions. 

## 2016-07-18 NOTE — Telephone Encounter (Signed)
NA remains low , ? Etiology  Will need to be rechecked.  Would get her to follow up with PCP in next 1-2 weeks .  Make sure she is eating and drinking okay .  Make sure she keeps follow up with [place] in 6 weeks for cxr to follow PNA  Please contact office for sooner follow up if symptoms do not improve or worsen or seek emergency care   Some PNA is still present , would take zpack to clear it up  Please contact office for sooner follow up if symptoms do not improve or worsen or seek emergency care  oiv with Dr. Lamonte Sakai In 6 weeks with cxr   I spoke with the pt and gave results  She verbalized understanding  I have scheduled rov with cxr for 08/31/16

## 2016-07-18 NOTE — Telephone Encounter (Signed)
See lab results.  

## 2016-07-18 NOTE — Progress Notes (Signed)
See phone note 4/3

## 2016-07-18 NOTE — Telephone Encounter (Signed)
Pt requesting lab results from 3/29. TP please advise.  Thanks!

## 2016-07-19 ENCOUNTER — Encounter: Payer: Self-pay | Admitting: Family Medicine

## 2016-07-19 ENCOUNTER — Ambulatory Visit (INDEPENDENT_AMBULATORY_CARE_PROVIDER_SITE_OTHER): Payer: Medicare Other | Admitting: Family Medicine

## 2016-07-19 VITALS — BP 164/90 | HR 64 | Temp 97.3°F | Resp 20 | Wt 152.5 lb

## 2016-07-19 DIAGNOSIS — E871 Hypo-osmolality and hyponatremia: Secondary | ICD-10-CM

## 2016-07-19 DIAGNOSIS — I10 Essential (primary) hypertension: Secondary | ICD-10-CM | POA: Diagnosis not present

## 2016-07-19 LAB — BASIC METABOLIC PANEL WITH GFR
BUN: 10 mg/dL (ref 7–25)
CALCIUM: 9.1 mg/dL (ref 8.6–10.4)
CO2: 23 mmol/L (ref 20–31)
Chloride: 98 mmol/L (ref 98–110)
Creat: 0.86 mg/dL (ref 0.60–0.88)
GFR, EST AFRICAN AMERICAN: 74 mL/min (ref >=60)
GFR, EST NON AFRICAN AMERICAN: 64 mL/min (ref >=60)
Glucose, Bld: 99 mg/dL (ref 65–99)
Potassium: 5 mmol/L (ref 3.5–5.3)
SODIUM: 132 mmol/L — AB (ref 135–146)

## 2016-07-19 MED ORDER — AMLODIPINE BESYLATE 2.5 MG PO TABS: 2.5000 mg | ORAL_TABLET | Freq: Every day | ORAL | 0 refills | Status: DC

## 2016-07-19 NOTE — Progress Notes (Signed)
Amy Rodriguez , [birth year], 83 y.o., female MRN: [id number] Patient Care Team    Relationship Specialty Notifications Start End  Ma Hillock, DO PCP - General Family Medicine  01/08/15   Shon Hough, MD Consulting Physician Ophthalmology  08/03/15   Collene Gobble, MD Consulting Physician Pulmonary Disease  08/03/15   Lelon Perla, MD Consulting Physician Cardiology  08/03/15   Garvin Fila, MD Consulting Physician Neurology  08/03/15   Inda Castle, MD Consulting Physician Gastroenterology  08/04/15     CC: Hyponatremia Subjective:  She presents today stating that her sodium has been low when blood was taken her pulmonologist office. SHe endorses feeling dizzy, lack of appetite. He was recently diagnosed with pneumonia. She initially was treated with Levaquin which she could not tolerate, this was changed to azithromycin which she has completed. She states she is feeling better from the pneumonia standpoint. She has been trying to drink Gatorade, V8 juice and increasing the salt intake. Her sodium level is naturally mildly low, baseline around 133. On 07/07/2016 her sodium level was 124 and on 07/13/2016 her sodium level was 125. She states her cardiologist did not agree with the hydralazine we added to her blood pressure regimen because she is not at goal. He then started her on hydrochlorothiazide. She stopped the hydrochlorothiazide approximately 10 days ago.  Of note patient reports she is uncertain if she is actually allergic to amlodipine. She states amlodipine was started with a another medication, and she had some mild palpitations. She is willing to try amlodipine at low dose to help control her blood pressure today.  Prior note 05/12/2016: She presents for follow-up on her hypertension today. She is currently taking losartan 100 mg daily and atenolol 12.5 mg daily.  She has a history of hyperlipidemia, she is currently prescribed Zocor 10 mg daily and is compliant with  medication, she reports no negative side effects to medication. Her last lipid panel in April 2017 was well controlled. 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 Parameters of LDL below 70 and a blood pressure below 130/80 was recommended. Patient reports she averages approximately 140/80 blood pressure readings at home. She does try to monitor the salt content in her diet. She does not exercise routinely. He denies any chest pain, shortness of breath, lower extremity edema or dizziness. She does take a daily baby aspirin. Depression screen [facility] 2/9 08/03/2015 07/08/2014 12/05/2011  Decreased Interest 0 0 1  Down, Depressed, Hopeless 0 0 0  PHQ - 2 Score 0 0 1    Allergies  Allergen Reactions  . Benzonatate Nausea And Vomiting    "tessalon pearl"  . Sulfonamide Derivatives Itching and Rash  . Amlodipine Other (See Comments)    Heart palpitations    Social History  Substance Use Topics  . Smoking status: Former Smoker    Packs/day: 1.00    Years: 25.00    Types: Cigarettes    Quit date: 04/17/1980  . Smokeless tobacco: Former Systems developer    Quit date: 03/16/1984     Comment: passive smoker, husband smoked  . Alcohol use No   Past Medical History:  Diagnosis Date  . Arthritis    sed rate 10, RF, CCP pending  . Blood in stool   . Cervical cancer ([place])    s/p hysterectomy/oop  . DDD (degenerative disc disease), lumbosacral   . Depression    Due to husbands passing away  09/22/2011  . Diverticulosis of colon   . Environmental allergies   . GERD (gastroesophageal reflux disease)   . H pylori ulcer    not treated due to expense  10/2001  . Hand dermatitis   . History of bronchitis    "w/a cold" (02/08/2012)  . HTN (hypertension)   . Hyperlipidemia   . Hypothyroidism    TSH 14.488 (11/2005)  . IBS (irritable bowel syndrome)   . Lactose intolerance   . Migraines    "get them very rarely now" (02/08/2012)  . Multiple pigmented nevi    last derm  evaluation 01/2003  . OA (osteoarthritis)    multiple sites  . Osteopenia   . Osteoporosis    "borderline" (02/08/2012)  . Other seborrheic keratosis   . Pain in joint, lower leg   . Pneumonia    "couple times in my lifetime" (02/08/2012)  . PONV (postoperative nausea and vomiting)    "and takes me a long time to come out under it" (02/08/2012)  . Posterior vitreous detachment 1996  . Postmenopausal    s/p hysterectomy for h/o cervical cancer 1982 (both ovaries taken at that time) now on hormonal replacement   . Postmenopausal HRT (hormone replacement therapy)   . PPD positive    history +PPD 1984, no treatment, no abnormal CXR  . Shortness of breath    with ambulation  . Stroke [facility])    TIA- April 2016 last one  . Supraventricular tachycardia, paroxysmal ([place])   . Urinary frequency   . Vaginal pruritus    Past Surgical History:  Procedure Laterality Date  . ABDOMINAL HYSTERECTOMY  1982  . CARDIOVASCULAR STRESS TEST  12/07/11   Normal nuclear stress test  . CATARACT EXTRACTION W/ INTRAOCULAR LENS IMPLANT  ?1997   right  . COLONOSCOPY    . EYE SURGERY     cataract removal right eye  . TOTAL ABDOMINAL HYSTERECTOMY W/ BILATERAL SALPINGOOPHORECTOMY  1982  . TOTAL KNEE ARTHROPLASTY  02/08/2012   Procedure: TOTAL KNEE ARTHROPLASTY;  Surgeon: Hessie Dibble, MD;  Location: [place];  Service: Orthopedics;  Laterality: Left;   Family History  Problem Relation Age of Onset  . Breast cancer Mother 25  . Lung cancer Brother 90  . Cancer Brother     lung  . Heart failure Father     congestive  . Heart disease Father   . Epilepsy Son   . Kidney disease Son     tuberous sclerosis, both kidneys removed, has transplant  . Diabetes Maternal Uncle   . Diabetes Paternal Aunt   . Cancer Maternal Grandmother     breast  . Heart disease Son     [place]  . COPD Son   . Rectal cancer Maternal Aunt   . Colon cancer Neg Hx   . Esophageal cancer Neg Hx   . Stomach cancer Neg  Hx    Allergies as of 07/19/2016      Reactions   Benzonatate Nausea And Vomiting   "tessalon pearl"   Sulfonamide Derivatives Itching, Rash   Amlodipine Other (See Comments)   Heart palpitations      Medication List       Accurate as of 07/19/16  3:01 PM. Always use your most recent med list.          albuterol 108 (90 Base) MCG/ACT inhaler Commonly known as:  PROAIR HFA Inhale 2 puffs into the lungs every 4 (four) hours as needed for wheezing or shortness  of breath.   aspirin EC 81 MG tablet Take 81 mg by mouth daily.   atenolol 25 MG tablet Commonly known as:  TENORMIN Take 0.5 tablets (12.5 mg total) by mouth 2 (two) times daily.   BREATHERITE COLL SPACER ADULT Misc Use with  Albuterol Inhaler.   fexofenadine 180 MG tablet Commonly known as:  ALLEGRA Take 1 tablet (180 mg total) by mouth daily.   fluticasone 50 MCG/ACT nasal spray Commonly known as:  FLONASE Place 2 sprays into both nostrils daily.   hydrochlorothiazide 25 MG tablet Commonly known as:  HYDRODIURIL Take 1 tablet (25 mg total) by mouth 2 (two) times daily.   levothyroxine 75 MCG tablet Commonly known as:  SYNTHROID, LEVOTHROID Take 1 tablet (75 mcg total) by mouth daily before breakfast. Follow up visit needed for further refills.   losartan 100 MG tablet Commonly known as:  COZAAR Take 1 tablet (100 mg total) by mouth daily.   omeprazole 10 MG capsule Commonly known as:  PRILOSEC Take 1 capsule (10 mg total) by mouth daily.   ondansetron 4 MG tablet Commonly known as:  ZOFRAN Take 1 tablet (4 mg total) by mouth every 8 (eight) hours as needed for nausea or vomiting.   simvastatin 10 MG tablet Commonly known as:  ZOCOR Take 1 tablet (10 mg total) by mouth every evening.   VITAMIN B12 PO Take 15 mLs by mouth daily.   VOLTAREN 1 % Gel Generic drug:  diclofenac sodium       No results found for this or any previous visit (from the past 24 hour(s)). No results found.   ROS:  Negative, with the exception of above mentioned in HPI   Objective:  BP (!) 164/90 (BP Location: Left Arm, Patient Position: Sitting, Cuff Size: Normal)   Pulse 64   Temp 97.3 F (36.3 C)   Resp 20   Wt 152 lb 8 oz (69.2 kg)   SpO2 96%   BMI 28.81 kg/m  Body mass index is 28.81 kg/m. Gen: Afebrile. No acute distress. Nontoxic in appearance, well developed, well nourished.  very pleasant Caucasian female. Does appear fatigued today.  HENT: AT. [place]. MMM, no oral lesions.  Eyes:Pupils Equal Round Reactive to light, Extraocular movements intact,  Conjunctiva without redness, discharge or icterus. CV: RRR,  No edema Chest: CTAB, no wheeze or crackles. Good air movement, normal resp effort.  Abd: Soft. NTND. BS Present.  Neuro: SHe is walking with a cane today, not her usual, she is steady with use. PERLA. EOMi. Alert. Oriented x3  Psych: Normal affect, dress and demeanor. Normal speech. Normal thought content and judgment.  Assessment/Plan: RIDDHI GRETHER is a 83 y.o. female present for OV for  Hyponatremia - She has been off HCTZ for 10 days, started by cardiology. She is to continue Gatorade/V8/salt content foods until sodium has returned to her baseline. If needed only may consider short-term use of salt tabs until sodium is replaced. - I have added HCTZ to her allergy/intolerance list. - BASIC METABOLIC PANEL WITH GFR  HYPERTENSION, BENIGN ESSENTIAL - Patient needs better control of her blood pressure. - Patient is unable to use and/or tolerate HCTZ, hydralazine.  - Her heart rate would not tolerate an increase in the beta blocker. - Prescription with her there are other medications that can be tried, however that would mean changing her current regimen of losartan, and she seems to be hesitant to try this today. - She had amlodipine on her intolerance list  for possible palpitations, which she states she is uncertain if the amlodipine was actually the cause of her palpitations.  Patient would like to retry this medicine. She is aware if she feels any palpitations she is to stop the medicine and call her cardiologist or here immediately. Will add very low-dose pain 2.5 mg daily. - Advised her if amlodipine does not work for her, I would suggest she follow with her cardiologist to find a regimen that keeps her blood pressure at goal. - She has a scheduled follow-up in 2 weeks, she will monitor her BP at home and record and bring those with her to her next appointment.  Reviewed expectations re: course of current medical issues.  Discussed self-management of symptoms.  Outlined signs and symptoms indicating need for more acute intervention.  Patient verbalized understanding and all questions were answered.  Patient received an After-Visit Summary.   electronically signed by:  Howard Pouch, DO  [place]

## 2016-07-19 NOTE — Patient Instructions (Signed)
Start amlodipine 2.5 mg daily and monitor BP daily and record, if above 140/90 routinely would want you to call in to let us know or you get any palpitations then we will need to stop.   We will call with labs once we get them.

## 2016-07-20 ENCOUNTER — Telehealth: Payer: Self-pay | Admitting: Family Medicine

## 2016-07-20 DIAGNOSIS — J189 Pneumonia, unspecified organism: Secondary | ICD-10-CM | POA: Insufficient documentation

## 2016-07-20 NOTE — Telephone Encounter (Signed)
Please call patient: Her electrolytes are looking much improved. Her sodium is 132, which is almost her baseline. She still should continue taking an electrolytes such as with Gatorade, stay well-hydrated. She still needs to take in sodium, but she does not need to take in a row high sodium, like she has been trying to do. She can cut back to her usual and just at Gatorade as well. - She is to keep her upcoming appointment in a few weeks, and we will recheck again.

## 2016-07-20 NOTE — Telephone Encounter (Signed)
Spoke with patient reviewed lab results and detailed instructions . Patient verbalized understanding of all instructions.

## 2016-07-20 NOTE — Assessment & Plan Note (Signed)
RUL PNA - clinically improving despite not completing full course. (only 2 days most likely not enough)   Will check xray , if still present , will add zpack   Plan  Patient Instructions  Continue on current regimen .  Chest xray today  Labs today  I will call with results.  Follow up with Dr. Lamonte Sakai  In 2-3 months and As needed   Please contact office for sooner follow up if symptoms do not improve or worsen or seek emergency care

## 2016-07-20 NOTE — Assessment & Plan Note (Signed)
Recheck BMET looking at sodium.

## 2016-08-03 ENCOUNTER — Other Ambulatory Visit (INDEPENDENT_AMBULATORY_CARE_PROVIDER_SITE_OTHER): Payer: Medicare Other

## 2016-08-03 DIAGNOSIS — R7303 Prediabetes: Secondary | ICD-10-CM

## 2016-08-03 DIAGNOSIS — I10 Essential (primary) hypertension: Secondary | ICD-10-CM

## 2016-08-03 DIAGNOSIS — E785 Hyperlipidemia, unspecified: Secondary | ICD-10-CM | POA: Diagnosis not present

## 2016-08-03 DIAGNOSIS — E559 Vitamin D deficiency, unspecified: Secondary | ICD-10-CM

## 2016-08-03 DIAGNOSIS — I729 Aneurysm of unspecified site: Secondary | ICD-10-CM | POA: Diagnosis not present

## 2016-08-03 LAB — CBC WITH DIFFERENTIAL/PLATELET
Basophils Absolute: 57 cells/uL (ref 0–200)
Basophils Relative: 1 %
Eosinophils Absolute: 342 cells/uL (ref 15–500)
Eosinophils Relative: 6 %
HCT: 38.7 % (ref 35.0–45.0)
Hemoglobin: 12.8 g/dL (ref 11.7–15.5)
LYMPHS PCT: 33 %
Lymphs Abs: 1881 cells/uL (ref 850–3900)
MCH: 28.3 pg (ref 27.0–33.0)
MCHC: 33.1 g/dL (ref 32.0–36.0)
MCV: 85.4 fL (ref 80.0–100.0)
MONOS PCT: 14 %
MPV: 10.1 fL (ref 7.5–12.5)
Monocytes Absolute: 798 cells/uL (ref 200–950)
Neutro Abs: 2622 cells/uL (ref 1500–7800)
Neutrophils Relative %: 46 %
Platelets: 198 10*3/uL (ref 140–400)
RBC: 4.53 MIL/uL (ref 3.80–5.10)
RDW: 14.4 % (ref 11.0–15.0)
WBC: 5.7 10*3/uL (ref 3.8–10.8)

## 2016-08-03 LAB — BASIC METABOLIC PANEL WITH GFR
BUN: 9 mg/dL (ref 7–25)
CALCIUM: 8.9 mg/dL (ref 8.6–10.4)
CO2: 26 mmol/L (ref 20–31)
CREATININE: 0.84 mg/dL (ref 0.60–0.88)
Chloride: 103 mmol/L (ref 98–110)
GFR, EST AFRICAN AMERICAN: 76 mL/min (ref >=60)
GFR, Est Non African American: 66 mL/min (ref >=60)
GLUCOSE: 81 mg/dL (ref 65–99)
Potassium: 5 mmol/L (ref 3.5–5.3)
Sodium: 136 mmol/L (ref 135–146)

## 2016-08-03 LAB — LIPID PANEL
CHOL/HDL RATIO: 2.7 ratio (ref <5.0)
Cholesterol: 118 mg/dL (ref <200)
HDL: 44 mg/dL — ABNORMAL LOW (ref >50)
LDL CALC: 51 mg/dL (ref <100)
Triglycerides: 113 mg/dL (ref <150)
VLDL: 23 mg/dL (ref <30)

## 2016-08-04 LAB — HEMOGLOBIN A1C
Hgb A1c MFr Bld: 5.5 % (ref <5.7)
Mean Plasma Glucose: 111 mg/dL

## 2016-08-04 LAB — VITAMIN D 25 HYDROXY (VIT D DEFICIENCY, FRACTURES): Vit D, 25-Hydroxy: 34 ng/mL (ref 30–100)

## 2016-08-07 ENCOUNTER — Ambulatory Visit (INDEPENDENT_AMBULATORY_CARE_PROVIDER_SITE_OTHER): Payer: Medicare Other | Admitting: Family Medicine

## 2016-08-07 ENCOUNTER — Ambulatory Visit: Payer: Medicare Other

## 2016-08-07 ENCOUNTER — Encounter: Payer: Self-pay | Admitting: Family Medicine

## 2016-08-07 VITALS — BP 142/78 | HR 64 | Temp 97.5°F | Resp 18 | Ht 61.0 in | Wt 152.4 lb

## 2016-08-07 DIAGNOSIS — I471 Supraventricular tachycardia: Secondary | ICD-10-CM

## 2016-08-07 DIAGNOSIS — I679 Cerebrovascular disease, unspecified: Secondary | ICD-10-CM | POA: Diagnosis not present

## 2016-08-07 DIAGNOSIS — G459 Transient cerebral ischemic attack, unspecified: Secondary | ICD-10-CM | POA: Diagnosis not present

## 2016-08-07 DIAGNOSIS — I10 Essential (primary) hypertension: Secondary | ICD-10-CM

## 2016-08-07 DIAGNOSIS — E871 Hypo-osmolality and hyponatremia: Secondary | ICD-10-CM

## 2016-08-07 DIAGNOSIS — K219 Gastro-esophageal reflux disease without esophagitis: Secondary | ICD-10-CM | POA: Diagnosis not present

## 2016-08-07 DIAGNOSIS — R94120 Abnormal auditory function study: Secondary | ICD-10-CM

## 2016-08-07 DIAGNOSIS — E038 Other specified hypothyroidism: Secondary | ICD-10-CM

## 2016-08-07 DIAGNOSIS — Z0000 Encounter for general adult medical examination without abnormal findings: Secondary | ICD-10-CM | POA: Diagnosis not present

## 2016-08-07 DIAGNOSIS — I729 Aneurysm of unspecified site: Secondary | ICD-10-CM | POA: Diagnosis not present

## 2016-08-07 DIAGNOSIS — E785 Hyperlipidemia, unspecified: Secondary | ICD-10-CM | POA: Diagnosis not present

## 2016-08-07 MED ORDER — OMEPRAZOLE 10 MG PO CPDR: 10.0000 mg | DELAYED_RELEASE_CAPSULE | Freq: Every day | ORAL | 2 refills | Status: DC

## 2016-08-07 NOTE — Progress Notes (Signed)
Amy Rodriguez , Sep 15, 1933, 83 y.o., female MRN: [id number] Patient Care Team    Relationship Specialty Notifications Start End  Ma Hillock, DO PCP - General Family Medicine  01/08/15   Shon Hough, MD Consulting Physician Ophthalmology  08/03/15   Collene Gobble, MD Consulting Physician Pulmonary Disease  08/03/15   Lelon Perla, MD Consulting Physician Cardiology  08/03/15   Garvin Fila, MD Consulting Physician Neurology  08/03/15   Inda Castle, MD Consulting Physician Gastroenterology  08/04/15   Philemon Kingdom, MD Consulting Physician Internal Medicine  08/07/16   Melrose Nakayama, MD Consulting Physician Orthopedic Surgery  08/07/16     CC: Chronic medical conditions Subjective:   Hypertension/hyperlipidemia: Patient is doing well on current medication dose of amlodipine 2.5 mg daily and atenolol 12.5 mg twice a day. Patient is also prescribed losartan 100 mg daily, she states she hasn't been taking this for the past week and her blood pressures were within normal parameters at home ~130/70. Patient reports compliance with daily baby aspirin and Zocor 10 mg daily. Patient does have a small MCA aneurysm, SVT, history of stroke/tia and is followed by cardiology. Hypothyroid: Patient reports compliance with levothyroxine 75 g daily. She does take on empty stomach. She denies any constipation, diarrhea, flushing or palpitations. Hyponatremia: Hyponatremia that resolved with last BMP with a sodium of 136. Patient states she is completely asymptomatic now. Hydrochlorothiazide has been placed on her intolerance list secondary to severe hyponatremia with symptoms. GERD: Patient reports she takes Prilosec 20 mg daily when necessary. She does not routinely take this every day but she is out of medications. She does watch her diet closely.  Depression screen [facility] 2/9 08/07/2016 08/03/2015 07/08/2014 12/05/2011  Decreased Interest 0 0 0 1  Down, Depressed, Hopeless 0 0 0 0  PHQ - 2  Score 0 0 0 1  Some recent data might be hidden    Allergies  Allergen Reactions  . Benzonatate Nausea And Vomiting    "tessalon pearl"  . Sulfonamide Derivatives Itching and Rash  . Hctz [Hydrochlorothiazide] Other (See Comments)    Severe hyponatremia   . Amlodipine Other (See Comments)    Heart palpitations    Social History  Substance Use Topics  . Smoking status: Former Smoker    Packs/day: 1.00    Years: 25.00    Types: Cigarettes    Quit date: 04/17/1980  . Smokeless tobacco: Former Systems developer    Quit date: 03/16/1984     Comment: passive smoker, husband smoked  . Alcohol use No   Past Medical History:  Diagnosis Date  . Arthritis    sed rate 10, RF, CCP pending  . Blood in stool   . Cervical cancer ([place])    s/p hysterectomy/oop  . DDD (degenerative disc disease), lumbosacral   . Depression    Due to husbands passing away 09/22/2011  . Diverticulosis of colon   . Environmental allergies   . GERD (gastroesophageal reflux disease)   . H pylori ulcer    not treated due to expense  10/2001  . Hand dermatitis   . History of bronchitis    "w/a cold" (02/08/2012)  . HTN (hypertension)   . Hyperlipidemia   . Hypothyroidism    TSH 14.488 (11/2005)  . IBS (irritable bowel syndrome)   . Lactose intolerance   . Migraines    "get them very rarely now" (02/08/2012)  . Multiple pigmented nevi    last derm evaluation 01/2003  .  OA (osteoarthritis)    multiple sites  . Osteopenia   . Osteoporosis    "borderline" (02/08/2012)  . Other seborrheic keratosis   . Pain in joint, lower leg   . Pneumonia    "couple times in my lifetime" (02/08/2012)  . PONV (postoperative nausea and vomiting)    "and takes me a long time to come out under it" (02/08/2012)  . Posterior vitreous detachment 1996  . Postmenopausal    s/p hysterectomy for h/o cervical cancer 1982 (both ovaries taken at that time) now on hormonal replacement   . Postmenopausal HRT (hormone replacement therapy)   .  PPD positive    history +PPD 1984, no treatment, no abnormal CXR  . Shortness of breath    with ambulation  . Stroke [facility])    TIA- April 2016 last one  . Supraventricular tachycardia, paroxysmal ([place])   . Urinary frequency   . Vaginal pruritus    Past Surgical History:  Procedure Laterality Date  . ABDOMINAL HYSTERECTOMY  1982  . CARDIOVASCULAR STRESS TEST  12/07/11   Normal nuclear stress test  . CATARACT EXTRACTION W/ INTRAOCULAR LENS IMPLANT  ?1997   right  . COLONOSCOPY    . EYE SURGERY     cataract removal right eye  . TOTAL ABDOMINAL HYSTERECTOMY W/ BILATERAL SALPINGOOPHORECTOMY  1982  . TOTAL KNEE ARTHROPLASTY  02/08/2012   Procedure: TOTAL KNEE ARTHROPLASTY;  Surgeon: Hessie Dibble, MD;  Location: [facility];  Service: Orthopedics;  Laterality: Left;   Family History  Problem Relation Age of Onset  . Breast cancer Mother 74  . Lung cancer Brother 86  . Cancer Brother     lung  . Heart failure Father     congestive  . Heart disease Father   . Epilepsy Son   . Kidney disease Son     tuberous sclerosis, both kidneys removed, has transplant  . Diabetes Maternal Uncle   . Diabetes Paternal Aunt   . Cancer Maternal Grandmother     breast  . Heart disease Son     [place] white  . COPD Son   . Rectal cancer Maternal Aunt   . Colon cancer Neg Hx   . Esophageal cancer Neg Hx   . Stomach cancer Neg Hx    Allergies as of 08/07/2016      Reactions   Benzonatate Nausea And Vomiting   "tessalon pearl"   Sulfonamide Derivatives Itching, Rash   Hctz [hydrochlorothiazide] Other (See Comments)   Severe hyponatremia    Amlodipine Other (See Comments)   Heart palpitations      Medication List       Accurate as of 08/07/16  3:02 PM. Always use your most recent med list.          albuterol 108 (90 Base) MCG/ACT inhaler Commonly known as:  PROAIR HFA Inhale 2 puffs into the lungs every 4 (four) hours as needed for wheezing or shortness of breath.   amLODipine 2.5  MG tablet Commonly known as:  NORVASC Take 1 tablet (2.5 mg total) by mouth daily.   aspirin EC 81 MG tablet Take 81 mg by mouth daily.   atenolol 25 MG tablet Commonly known as:  TENORMIN Take 0.5 tablets (12.5 mg total) by mouth 2 (two) times daily.   BREATHERITE COLL SPACER ADULT Misc Use with  Albuterol Inhaler.   fexofenadine 180 MG tablet Commonly known as:  ALLEGRA Take 1 tablet (180 mg total) by mouth daily.   fluticasone 50  MCG/ACT nasal spray Commonly known as:  FLONASE Place 2 sprays into both nostrils daily.   levothyroxine 75 MCG tablet Commonly known as:  SYNTHROID, LEVOTHROID Take 1 tablet (75 mcg total) by mouth daily before breakfast. Follow up visit needed for further refills.   losartan 100 MG tablet Commonly known as:  COZAAR Take 1 tablet (100 mg total) by mouth daily.   omeprazole 10 MG capsule Commonly known as:  PRILOSEC Take 1 capsule (10 mg total) by mouth daily.   ondansetron 4 MG tablet Commonly known as:  ZOFRAN Take 1 tablet (4 mg total) by mouth every 8 (eight) hours as needed for nausea or vomiting.   simvastatin 10 MG tablet Commonly known as:  ZOCOR Take 1 tablet (10 mg total) by mouth every evening.   VITAMIN B12 PO Take 15 mLs by mouth daily.   VOLTAREN 1 % Gel Generic drug:  diclofenac sodium       No results found for this or any previous visit (from the past 24 hour(s)). No results found.   ROS: Negative, with the exception of above mentioned in HPI   Objective:  BP (!) 142/78 (BP Location: Left Arm, Patient Position: Sitting, Cuff Size: Normal)   Pulse 64   Temp 97.5 F (36.4 C) (Oral)   Resp 18   Ht 5\' 1"  (1.549 m)   Wt 152 lb 6.4 oz (69.1 kg)   SpO2 97%   BMI 28.80 kg/m  Body mass index is 28.8 kg/m. Gen: Afebrile. No acute distress. Nontoxic in appearance, well developed, well nourished.  HENT: AT. [place].MMM Eyes:Pupils Equal Round Reactive to light, Extraocular movements intact,  Conjunctiva without  redness, discharge or icterus. Neck/lymp/endocrine: Supple,No lymphadenopathy, no thyromegaly CV: RRR, no edema Chest: CTAB, no wheeze or crackles. Good air movement, normal resp effort.  Abd: Soft. NTND. BS present.  Skin: no rashes, purpura or petechiae.  Neuro:  Normal gait. PERLA. EOMi. Alert. Oriented x3  Psych: Normal affect, dress and demeanor. Normal speech. Normal thought content and judgment.  Assessment/Plan: SKARLET LYONS is a 83 y.o. female present for OV for follow-up on chronic medical conditions. Encounter for Medicare annual wellness exam Completed by health coach today. Failed hearing screening - Ambulatory referral to Audiology  SVT/TIA history/small MCA aneurysm/hypertension/hyperlipidemia/CVD: Blood pressure mildly above goal today. Discussed this with patient. However she reports all blood pressures at home are approximately 130/70's without losartan. She is convinced she does not need Losartan medications now that she is able to tolerate the amlodipine - Discussed with patient would want her blood pressure goal to be less than 130/80, the tighter the control the better with her history. - Trial off losartan until Friday. She is to take her blood pressure 2 hours after taking the amlodipine/atenolol and record BP readings daily. She will call in on Friday with BP readings. If above goal could consider increasing amlodipine so that she can stay off the losartan. Strongly encouraged her if her blood pressure readings are above goal without losartan, ideally would want to keep her on this medication. If she is having high readings at home office medication, she is to restart before Friday. - Continue statin and ASA. Patient's lipid panel today looks great. She has history of CVD. Would likely benefit from daily low-dose statin, although total cholesterol and LDL are quite low and she is 83 years old. I encouraged her to talk to her cardiologist concerning this matter, they  may feel she'll still benefit from added statin  given her history. hypothyroidism tsh normal July 2017. Continue to follow yearly chronic medical conditions appointments. - Continue levothyroxine 75 g daily Prediabetes: A1c 5.5 today, keep up the good work, dietary modifications with low-carb and exercise. Hyponatremia - Resolved with discontinuation of HCTZ. Patient not to take HCTZ in the future secondary to severe hyponatremia with symptoms. GERD: Refills on Prilosec 20 mg daily. f/u 6 months on chronic issues  Reviewed expectations re: course of current medical issues.  Discussed self-management of symptoms.  Outlined signs and symptoms indicating need for more acute intervention.  Patient verbalized understanding and all questions were answered.  Patient received an After-Visit Summary.   electronically signed by:  Howard Pouch, DO  [place]

## 2016-08-07 NOTE — Progress Notes (Signed)
Pre visit review using our clinic review tool, if applicable. No additional management support is needed unless otherwise documented below in the visit note. 

## 2016-08-07 NOTE — Progress Notes (Addendum)
Subjective:   Amy Rodriguez is a 83 y.o. female who presents for Medicare Annual (Subsequent) preventive examination.  Review of Systems:  No ROS.  Medicare Wellness Visit.  Cardiac Risk Factors include: advanced age (>46men, >58 women);dyslipidemia;hypertension;family history of premature cardiovascular disease   Sleep patterns: Sleeps 5-6 hours, feels rested. Up to void x 2.  Home Safety/Smoke Alarms:  Smoked detectors in place.  Living environment; residence and Firearm Safety: Lives with son in 1 story home, feels safe in home. No firearms.  Seat Belt Safety/Bike Helmet: Wears seat belt.   Counseling:   Eye Exam-Last exam 2016, every 2 years Dr. Kathrin Penner  Dental-Full dentures. Gum care discussed.   Female:   Pap- 2003      Mammo-08/31/2015, negative. Will schedule.        Dexa scan-08/31/2015, Osteopenia.        CCS-Colonoscopy 10/07/2015, polyp.     Objective:     Vitals: BP (!) 142/78 (BP Location: Left Arm, Patient Position: Sitting, Cuff Size: Normal)   Pulse 64   Temp 97.5 F (36.4 C) (Oral)   Resp 18   Ht 5\' 1"  (1.549 m)   Wt 152 lb 6.4 oz (69.1 kg)   SpO2 97%   BMI 28.80 kg/m   Body mass index is 28.8 kg/m.   Tobacco History  Smoking Status  . Former Smoker  . Packs/day: 1.00  . Years: 25.00  . Types: Cigarettes  . Quit date: 04/17/1980  Smokeless Tobacco  . Former Systems developer  . Quit date: 03/16/1984    Comment: passive smoker, husband smoked     Counseling given: No   Past Medical History:  Diagnosis Date  . Arthritis    sed rate 10, RF, CCP pending  . Blood in stool   . Cervical cancer ([place])    s/p hysterectomy/oop  . DDD (degenerative disc disease), lumbosacral   . Depression    Due to husbands passing away 09/22/2011  . Diverticulosis of colon   . Environmental allergies   . GERD (gastroesophageal reflux disease)   . H pylori ulcer    not treated due to expense  10/2001  . Hand dermatitis   . History of bronchitis    "w/a cold"  (02/08/2012)  . HTN (hypertension)   . Hyperlipidemia   . Hypothyroidism    TSH 14.488 (11/2005)  . IBS (irritable bowel syndrome)   . Lactose intolerance   . Migraines    "get them very rarely now" (02/08/2012)  . Multiple pigmented nevi    last derm evaluation 01/2003  . OA (osteoarthritis)    multiple sites  . Osteopenia   . Osteoporosis    "borderline" (02/08/2012)  . Other seborrheic keratosis   . Pain in joint, lower leg   . Pneumonia    "couple times in my lifetime" (02/08/2012)  . PONV (postoperative nausea and vomiting)    "and takes me a long time to come out under it" (02/08/2012)  . Posterior vitreous detachment 1996  . Postmenopausal    s/p hysterectomy for h/o cervical cancer 1982 (both ovaries taken at that time) now on hormonal replacement   . Postmenopausal HRT (hormone replacement therapy)   . PPD positive    history +PPD 1984, no treatment, no abnormal CXR  . Shortness of breath    with ambulation  . Stroke [facility])    TIA- April 2016 last one  . Supraventricular tachycardia, paroxysmal ([place])   . Urinary frequency   . Vaginal pruritus  Past Surgical History:  Procedure Laterality Date  . ABDOMINAL HYSTERECTOMY  1982  . CARDIOVASCULAR STRESS TEST  12/07/11   Normal nuclear stress test  . CATARACT EXTRACTION W/ INTRAOCULAR LENS IMPLANT  ?1997   right  . COLONOSCOPY    . EYE SURGERY     cataract removal right eye  . TOTAL ABDOMINAL HYSTERECTOMY W/ BILATERAL SALPINGOOPHORECTOMY  1982  . TOTAL KNEE ARTHROPLASTY  02/08/2012   Procedure: TOTAL KNEE ARTHROPLASTY;  Surgeon: Hessie Dibble, MD;  Location: [facility];  Service: Orthopedics;  Laterality: Left;   Family History  Problem Relation Age of Onset  . Breast cancer Mother 87  . Lung cancer Brother 19  . Cancer Brother     lung  . Heart failure Father     congestive  . Heart disease Father   . Epilepsy Son   . Kidney disease Son     tuberous sclerosis, both kidneys removed, has transplant  .  Diabetes Maternal Uncle   . Diabetes Paternal Aunt   . Cancer Maternal Grandmother     breast  . Heart disease Son     [place] white  . COPD Son   . Rectal cancer Maternal Aunt   . Colon cancer Neg Hx   . Esophageal cancer Neg Hx   . Stomach cancer Neg Hx    History  Sexual Activity  . Sexual activity: No    Outpatient Encounter Prescriptions as of 08/07/2016  Medication Sig  . albuterol (PROAIR HFA) 108 (90 Base) MCG/ACT inhaler Inhale 2 puffs into the lungs every 4 (four) hours as needed for wheezing or shortness of breath.  Marland Kitchen amLODipine (NORVASC) 2.5 MG tablet Take 1 tablet (2.5 mg total) by mouth daily.  Marland Kitchen aspirin EC 81 MG tablet Take 81 mg by mouth daily.  Marland Kitchen atenolol (TENORMIN) 25 MG tablet Take 0.5 tablets (12.5 mg total) by mouth 2 (two) times daily.  . Cyanocobalamin (VITAMIN B12 PO) Take 15 mLs by mouth daily.   . fexofenadine (ALLEGRA) 180 MG tablet Take 1 tablet (180 mg total) by mouth daily. (Patient taking differently: Take 180 mg by mouth as needed. )  . fluticasone (FLONASE) 50 MCG/ACT nasal spray Place 2 sprays into both nostrils daily.  Marland Kitchen levothyroxine (SYNTHROID, LEVOTHROID) 75 MCG tablet Take 1 tablet (75 mcg total) by mouth daily before breakfast. Follow up visit needed for further refills.  Marland Kitchen losartan (COZAAR) 100 MG tablet Take 1 tablet (100 mg total) by mouth daily.  Marland Kitchen omeprazole (PRILOSEC) 10 MG capsule Take 1 capsule (10 mg total) by mouth daily. (Patient taking differently: Take 10 mg by mouth as needed. )  . ondansetron (ZOFRAN) 4 MG tablet Take 1 tablet (4 mg total) by mouth every 8 (eight) hours as needed for nausea or vomiting.  . simvastatin (ZOCOR) 10 MG tablet Take 1 tablet (10 mg total) by mouth every evening.  Marland Kitchen Spacer/Aero-Holding Chambers (BREATHERITE COLL SPACER ADULT) MISC Use with  Albuterol Inhaler.  . VOLTAREN 1 % GEL    No facility-administered encounter medications on file as of 08/07/2016.     Activities of Daily Living In your  present state of health, do you have any difficulty performing the following activities: 08/07/2016  Hearing? Y  Vision? N  Difficulty concentrating or making decisions? N  Walking or climbing stairs? N  Dressing or bathing? N  Doing errands, shopping? N  Preparing Food and eating ? N  Using the Toilet? N  In the past six months, have  you accidently leaked urine? N  Do you have problems with loss of bowel control? N  Managing your Medications? N  Managing your Finances? N  Housekeeping or managing your Housekeeping? N  Some recent data might be hidden    Patient Care Team: Ma Hillock, DO as PCP - General (Family Medicine) Shon Hough, MD as Consulting Physician (Ophthalmology) Collene Gobble, MD as Consulting Physician (Pulmonary Disease) Lelon Perla, MD as Consulting Physician (Cardiology) Garvin Fila, MD as Consulting Physician (Neurology) Inda Castle, MD as Consulting Physician (Gastroenterology) Philemon Kingdom, MD as Consulting Physician (Internal Medicine) Melrose Nakayama, MD as Consulting Physician (Orthopedic Surgery)    Assessment:    Physical assessment deferred to PCP.  Exercise Activities and Dietary recommendations Current Exercise Habits: The patient does not participate in regular exercise at present [facility], walks to [employer]), Exercise limited by: orthopedic condition(s);cardiac condition(s);respiratory conditions(s)   Diet (meal preparation, eat out, water intake, caffeinated beverages, dairy products, fruits and vegetables): Drinks Gatorade, V8 juice and water.   Breakfast: Eggs, sausage, cereal, oatmeal, coffee (decaf-4 cups/day) Lunch: sandwich, leftovers Dinner: lean protein, vegetables.     Discussed heart healthy diet and remaining active.   Goals    . sleep          Increase amount of sleep nightly, will start by decreasing coffee intake after 7 pm.       Fall Risk Fall Risk  08/07/2016 08/03/2015 07/08/2014  Falls in the  past year? No No No   Depression Screen PHQ 2/9 Scores 08/07/2016 08/03/2015 07/08/2014 12/05/2011  PHQ - 2 Score 0 0 0 1     Cognitive Function MMSE - Mini Mental State Exam 08/07/2016  Orientation to time 5  Orientation to Place 5  Registration 3  Attention/ Calculation 5  Recall 3  Language- name 2 objects 2  Language- repeat 1  Language- follow 3 step command 3  Language- read & follow direction 1  Write a sentence 1  Copy design 1  Total score 30        Immunization History  Administered Date(s) Administered  . Influenza Split 01/12/2011, 02/09/2012  . Influenza Whole 02/08/2007, 01/24/2008, 12/16/2008, 01/27/2009, 02/17/2010  . Influenza,inj,Quad PF,36+ Mos 12/17/2012, 12/19/2013, 01/08/2015, 01/06/2016  . Pneumococcal Conjugate-13 07/08/2014  . Pneumococcal Polysaccharide-23 05/23/2004  . Td 06/16/2002  . Tdap 12/17/2012  . Zoster 12/17/2012   Screening Tests Health Maintenance  Topic Date Due  . MAMMOGRAM  08/30/2016  . INFLUENZA VACCINE  11/15/2016  . DEXA SCAN  08/30/2017  . TETANUS/TDAP  12/18/2022  . PNA vac Low Risk Adult  Completed      Plan:      Schedule mammogram after 08/30/2016.   Bring a copy of your advance directives to your next office visit.  Start brain stimulating activities (puzzles, reading, adult coloring books, staying active) to keep memory sharp.   Make appointment with Audiologist, they will be calling you.   During the course of the visit the patient was educated and counseled about the following appropriate screening and preventive services:   Vaccines to include Pneumoccal, Influenza, Hepatitis B, Td, Zostavax, HCV  Cardiovascular Disease  Colorectal cancer screening  Bone density screening  Diabetes screening  Glaucoma screening  Mammography/PAP  Nutrition counseling   Patient Instructions (the written plan) was given to the patient.   Gerilyn Nestle,  RN  08/07/2016   _____________________________________________________________ FYI--Referral placed for Audiology (requested, failed hearing test)       --Will  schedule mammogram after 08/30/2016.   Medical screening examination/treatment/procedure(s) were performed by non-physician practitioner and as supervising physician I was immediately available for consultation/collaboration.  I agree with above assessment and plan.  Electronically Signed by: Howard Pouch, DO [facility] [place]

## 2016-08-07 NOTE — Patient Instructions (Addendum)
Schedule mammogram after 08/30/2016.   Bring a copy of your advance directives to your next office visit.  Start brain stimulating activities (puzzles, reading, adult coloring books, staying active) to keep memory sharp.   Health Maintenance, Female Adopting a healthy lifestyle and getting preventive care can go a long way to promote health and wellness. Talk with your health care provider about what schedule of regular examinations is right for you. This is a good chance for you to check in with your provider about disease prevention and staying healthy. In between checkups, there are plenty of things you can do on your own. Experts have done a lot of research about which lifestyle changes and preventive measures are most likely to keep you healthy. Ask your health care provider for more information. Weight and diet Eat a healthy diet  Be sure to include plenty of vegetables, fruits, low-fat dairy products, and lean protein.  Do not eat a lot of foods high in solid fats, added sugars, or salt.  Get regular exercise. This is one of the most important things you can do for your health.  Most adults should exercise for at least 150 minutes each week. The exercise should increase your heart rate and make you sweat (moderate-intensity exercise).  Most adults should also do strengthening exercises at least twice a week. This is in addition to the moderate-intensity exercise. Maintain a healthy weight  Body mass index (BMI) is a measurement that can be used to identify possible weight problems. It estimates body fat based on height and weight. Your health care provider can help determine your BMI and help you achieve or maintain a healthy weight.  For females 81 years of age and older:  A BMI below 18.5 is considered underweight.  A BMI of 18.5 to 24.9 is normal.  A BMI of 25 to 29.9 is considered overweight.  A BMI of 30 and above is considered obese. Watch levels of cholesterol and  blood lipids  You should start having your blood tested for lipids and cholesterol at 81 years of age, then have this test every 5 years.  You may need to have your cholesterol levels checked more often if:  Your lipid or cholesterol levels are high.  You are older than 81 years of age.  You are at high risk for heart disease. Cancer screening Lung Cancer  Lung cancer screening is recommended for adults 15-2 years old who are at high risk for lung cancer because of a history of smoking.  A yearly low-dose CT scan of the lungs is recommended for people who:  Currently smoke.  Have quit within the past 15 years.  Have at least a 30-pack-year history of smoking. A pack year is smoking an average of one pack of cigarettes a day for 1 year.  Yearly screening should continue until it has been 15 years since you quit.  Yearly screening should stop if you develop a health problem that would prevent you from having lung cancer treatment. Breast Cancer  Practice breast self-awareness. This means understanding how your breasts normally appear and feel.  It also means doing regular breast self-exams. Let your health care provider know about any changes, no matter how small.  If you are in your 20s or 30s, you should have a clinical breast exam (CBE) by a health care provider every 1-3 years as part of a regular health exam.  If you are 85 or older, have a CBE every year. Also consider having  a breast X-ray (mammogram) every year.  If you have a family history of breast cancer, talk to your health care provider about genetic screening.  If you are at high risk for breast cancer, talk to your health care provider about having an MRI and a mammogram every year.  Breast cancer gene (BRCA) assessment is recommended for women who have family members with BRCA-related cancers. BRCA-related cancers include:  Breast.  Ovarian.  Tubal.  Peritoneal cancers.  Results of the assessment  will determine the need for genetic counseling and BRCA1 and BRCA2 testing. Cervical Cancer  Your health care provider may recommend that you be screened regularly for cancer of the pelvic organs (ovaries, uterus, and vagina). This screening involves a pelvic examination, including checking for microscopic changes to the surface of your cervix (Pap test). You may be encouraged to have this screening done every 3 years, beginning at age 20.  For women ages 19-65, health care providers may recommend pelvic exams and Pap testing every 3 years, or they may recommend the Pap and pelvic exam, combined with testing for human papilloma virus (HPV), every 5 years. Some types of HPV increase your risk of cervical cancer. Testing for HPV may also be done on women of any age with unclear Pap test results.  Other health care providers may not recommend any screening for nonpregnant women who are considered low risk for pelvic cancer and who do not have symptoms. Ask your health care provider if a screening pelvic exam is right for you.  If you have had past treatment for cervical cancer or a condition that could lead to cancer, you need Pap tests and screening for cancer for at least 20 years after your treatment. If Pap tests have been discontinued, your risk factors (such as having a new sexual partner) need to be reassessed to determine if screening should resume. Some women have medical problems that increase the chance of getting cervical cancer. In these cases, your health care provider may recommend more frequent screening and Pap tests. Colorectal Cancer  This type of cancer can be detected and often prevented.  Routine colorectal cancer screening usually begins at 81 years of age and continues through 81 years of age.  Your health care provider may recommend screening at an earlier age if you have risk factors for colon cancer.  Your health care provider may also recommend using home test kits to check  for hidden blood in the stool.  A small camera at the end of a tube can be used to examine your colon directly (sigmoidoscopy or colonoscopy). This is done to check for the earliest forms of colorectal cancer.  Routine screening usually begins at age 69.  Direct examination of the colon should be repeated every 5-10 years through 81 years of age. However, you may need to be screened more often if early forms of precancerous polyps or small growths are found. Skin Cancer  Check your skin from head to toe regularly.  Tell your health care provider about any new moles or changes in moles, especially if there is a change in a mole's shape or color.  Also tell your health care provider if you have a mole that is larger than the size of a pencil eraser.  Always use sunscreen. Apply sunscreen liberally and repeatedly throughout the day.  Protect yourself by wearing long sleeves, pants, a wide-brimmed hat, and sunglasses whenever you are outside. Heart disease, diabetes, and high blood pressure  High blood pressure  causes heart disease and increases the risk of stroke. High blood pressure is more likely to develop in:  People who have blood pressure in the high end of the normal range (130-139/85-89 mm Hg).  People who are overweight or obese.  People who are African American.  If you are 72-74 years of age, have your blood pressure checked every 3-5 years. If you are 93 years of age or older, have your blood pressure checked every year. You should have your blood pressure measured twice-once when you are at a hospital or clinic, and once when you are not at a hospital or clinic. Record the average of the two measurements. To check your blood pressure when you are not at a hospital or clinic, you can use:  An automated blood pressure machine at a pharmacy.  A home blood pressure monitor.  If you are between 28 years and 67 years old, ask your health care provider if you should take aspirin  to prevent strokes.  Have regular diabetes screenings. This involves taking a blood sample to check your fasting blood sugar level.  If you are at a normal weight and have a low risk for diabetes, have this test once every three years after 81 years of age.  If you are overweight and have a high risk for diabetes, consider being tested at a younger age or more often. Preventing infection Hepatitis B  If you have a higher risk for hepatitis B, you should be screened for this virus. You are considered at high risk for hepatitis B if:  You were born in a country where hepatitis B is common. Ask your health care provider which countries are considered high risk.  Your parents were born in a high-risk country, and you have not been immunized against hepatitis B (hepatitis B vaccine).  You have HIV or AIDS.  You use needles to inject street drugs.  You live with someone who has hepatitis B.  You have had sex with someone who has hepatitis B.  You get hemodialysis treatment.  You take certain medicines for conditions, including cancer, organ transplantation, and autoimmune conditions. Hepatitis C  Blood testing is recommended for:  Everyone born from [birth year] through 1963.  Anyone with known risk factors for hepatitis C. Sexually transmitted infections (STIs)  You should be screened for sexually transmitted infections (STIs) including gonorrhea and chlamydia if:  You are sexually active and are younger than 81 years of age.  You are older than 81 years of age and your health care provider tells you that you are at risk for this type of infection.  Your sexual activity has changed since you were last screened and you are at an increased risk for chlamydia or gonorrhea. Ask your health care provider if you are at risk.  If you do not have HIV, but are at risk, it may be recommended that you take a prescription medicine daily to prevent HIV infection. This is called pre-exposure  prophylaxis (PrEP). You are considered at risk if:  You are sexually active and do not regularly use condoms or know the HIV status of your partner(s).  You take drugs by injection.  You are sexually active with a partner who has HIV. Talk with your health care provider about whether you are at high risk of being infected with HIV. If you choose to begin PrEP, you should first be tested for HIV. You should then be tested every 3 months for as long as you are  taking PrEP. Pregnancy  If you are premenopausal and you may become pregnant, ask your health care provider about preconception counseling.  If you may become pregnant, take 400 to 800 micrograms (mcg) of folic acid every day.  If you want to prevent pregnancy, talk to your health care provider about birth control (contraception). Osteoporosis and menopause  Osteoporosis is a disease in which the bones lose minerals and strength with aging. This can result in serious bone fractures. Your risk for osteoporosis can be identified using a bone density scan.  If you are 78 years of age or older, or if you are at risk for osteoporosis and fractures, ask your health care provider if you should be screened.  Ask your health care provider whether you should take a calcium or vitamin D supplement to lower your risk for osteoporosis.  Menopause may have certain physical symptoms and risks.  Hormone replacement therapy may reduce some of these symptoms and risks. Talk to your health care provider about whether hormone replacement therapy is right for you. Follow these instructions at home:  Schedule regular health, dental, and eye exams.  Stay current with your immunizations.  Do not use any tobacco products including cigarettes, chewing tobacco, or electronic cigarettes.  If you are pregnant, do not drink alcohol.  If you are breastfeeding, limit how much and how often you drink alcohol.  Limit alcohol intake to no more than 1 drink  per day for nonpregnant women. One drink equals 12 ounces of beer, 5 ounces of wine, or 1 ounces of hard liquor.  Do not use street drugs.  Do not share needles.  Ask your health care provider for help if you need support or information about quitting drugs.  Tell your health care provider if you often feel depressed.  Tell your health care provider if you have ever been abused or do not feel safe at home. This information is not intended to replace advice given to you by your health care provider. Make sure you discuss any questions you have with your health care provider. Document Released: 10/17/2010 Document Revised: 09/09/2015 Document Reviewed: 01/05/2015 Elsevier Interactive Patient Education  2017 [place] BP daily at least 2 hours after medicine is taken. You can try to hold losartan if desired, but if above 140/90 routinely call in and we will give you guidance.  Continue amlodipine 2.5 mg.

## 2016-08-09 ENCOUNTER — Other Ambulatory Visit: Payer: Medicare Other

## 2016-08-11 ENCOUNTER — Ambulatory Visit: Payer: Medicare Other

## 2016-08-11 ENCOUNTER — Ambulatory Visit: Payer: Medicare Other | Admitting: Family Medicine

## 2016-08-24 ENCOUNTER — Encounter: Payer: Self-pay | Admitting: Family Medicine

## 2016-08-28 ENCOUNTER — Other Ambulatory Visit: Payer: Self-pay

## 2016-08-28 ENCOUNTER — Telehealth: Payer: Self-pay | Admitting: Family Medicine

## 2016-08-28 MED ORDER — AMLODIPINE BESYLATE 2.5 MG PO TABS: 2.5000 mg | ORAL_TABLET | Freq: Two times a day (BID) | ORAL | 1 refills | Status: DC

## 2016-08-28 NOTE — Telephone Encounter (Signed)
Please call pt: - I received her BP readings via echart.  - please ask her if those readings were withOUT losartan (she wanted a trial off of it)?--> if no losartan with those readings, I want her to increase her amlodipine to 5 mg QD (2 of her current tabs) and continue monitoring BP.

## 2016-08-28 NOTE — Telephone Encounter (Signed)
Refill on Amlodipine sent to pharmacy

## 2016-08-28 NOTE — Telephone Encounter (Signed)
Called patient, she stated that her BP readings were off of Losartan and on Amlodipine 1 tab a day.  Patient instructed to increase to 2 tabs a day and continue monitoring BP levels.  Patient requesting refill on Amlodipine. Refill sent to pharmacy.

## 2016-08-31 ENCOUNTER — Ambulatory Visit (INDEPENDENT_AMBULATORY_CARE_PROVIDER_SITE_OTHER): Payer: Medicare Other | Admitting: Acute Care

## 2016-08-31 ENCOUNTER — Ambulatory Visit (INDEPENDENT_AMBULATORY_CARE_PROVIDER_SITE_OTHER)
Admission: RE | Admit: 2016-08-31 | Discharge: 2016-08-31 | Disposition: A | Discharge status: Home / Self Care | Payer: Medicare Other | Source: Ambulatory Visit | Attending: Acute Care | Admitting: Acute Care

## 2016-08-31 ENCOUNTER — Encounter: Payer: Self-pay | Admitting: Acute Care

## 2016-08-31 ENCOUNTER — Ambulatory Visit: Payer: Medicare Other | Admitting: Emergency Medicine

## 2016-08-31 VITALS — BP 120/72 | HR 68 | Ht 61.0 in | Wt 132.4 lb

## 2016-08-31 DIAGNOSIS — J181 Lobar pneumonia, unspecified organism: Secondary | ICD-10-CM

## 2016-08-31 DIAGNOSIS — J439 Emphysema, unspecified: Secondary | ICD-10-CM | POA: Diagnosis not present

## 2016-08-31 DIAGNOSIS — J189 Pneumonia, unspecified organism: Secondary | ICD-10-CM

## 2016-08-31 NOTE — Patient Instructions (Addendum)
It is nice to meet you today. Your CXR has cleared. Continue taking Allegra daily Add Flonase 1 squirt up each nare in the morning. Please schedule annual with Dr. Lamonte Sakai for January of 2019. Please contact office for sooner follow up if symptoms do not improve or worsen or seek emergency care

## 2016-08-31 NOTE — Progress Notes (Signed)
History of Present Illness Amy Rodriguez is a 83 y.o. female with remote smoking history followed for mild COPD, bronchiectasis, and emphysema. She is followed by Dr. Elsworth Soho   08/31/2016 Follow Up: Patient presents for follow-up of pneumonia of right upper lobe diagnosed 07/07/2016 by chest x-ray.. She had cough, with chills and headache. Sputum at the time was greenish. She was treated with Levaquin x 7 days, however only took 2 days worth due to the fact it made her sick. She was subsequently prescribed a Z-Pak on 07/13/2016 which she did complete.Marland Kitchen She was also diagnosed with hyponatremia , which she states has subsequently resolved. Her PCP did labs 2 weeks ago to confirm. Pt. States her symptoms have cleared since the z-pack. She denies fever, chest pain, orthopnea or hemoptysis. She states she has occasional clear secretions.She has not had to use her rescue inhaler. 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  Test Results:  Chest x-ray 08/31/2016 IMPRESSION: Underlying emphysematous change. No edema or consolidation. Stable cardiac silhouette. There is aortic atherosclerosis. There is bilateral carotid artery calcification.  CBC Latest Ref Rng & Units 08/03/2016 07/13/2016 07/07/2016  WBC 3.8 - 10.8 K/uL 5.7 8.0 13.8(H)  Hemoglobin 11.7 - 15.5 g/dL 12.8 13.3 12.9  Hematocrit 35.0 - 45.0 % 38.7 38.4 37.3  Platelets 140 - 400 K/uL 198 379.0 243.0    BMP Latest Ref Rng & Units 08/03/2016 07/19/2016 07/13/2016  Glucose 65 - 99 mg/dL 81 99 99  BUN 7 - 25 mg/dL 9 10 10   Creatinine 0.60 - 0.88 mg/dL 0.84 0.86 0.93  Sodium 135 - 146 mmol/L 136 132(L) 125(L)  Potassium 3.5 - 5.3 mmol/L 5.0 5.0 4.4  Chloride 98 - 110 mmol/L 103 98 90(L)  CO2 20 - 31 mmol/L 26 23 27   Calcium 8.6 - 10.4 mg/dL 8.9 9.1 8.9    PFT    Component Value Date/Time   FEV1PRE 2.04 04/14/2014 0946   FEV1POST 1.63 04/14/2014 0946   FVCPRE 2.72 04/14/2014 0946   FVCPOST 2.54 04/14/2014 0946   TLC 4.90  04/14/2014 0946   DLCOUNC 9.06 04/14/2014 0946   PREFEV1FVCRT 75 04/14/2014 0946   PSTFEV1FVCRT 64 04/14/2014 0946      Past medical hx Past Medical History:  Diagnosis Date  . Arthritis    sed rate 10, RF, CCP pending  . Blood in stool   . Cervical cancer ([place])    s/p hysterectomy/oop  . DDD (degenerative disc disease), lumbosacral   . Depression    Due to husbands passing away 09/22/2011  . Diverticulosis of colon   . Environmental allergies   . GERD (gastroesophageal reflux disease)   . H pylori ulcer    not treated due to expense  10/2001  . Hand dermatitis   . HTN (hypertension)   . Hyperlipidemia   . Hypothyroidism    TSH 14.488 (11/2005)  . IBS (irritable bowel syndrome)   . Lactose intolerance   . Migraines    "get them very rarely now" (02/08/2012)  . Multiple pigmented nevi    last derm evaluation 01/2003  . OA (osteoarthritis)    multiple sites  . Osteoporosis    "borderline" (02/08/2012)  . Other seborrheic keratosis   . Pain in joint, lower leg   . Pneumonia    "couple times in my lifetime" (02/08/2012)  . PONV (postoperative nausea and vomiting)    "and takes me a long time to come out under it" (02/08/2012)  . Posterior  vitreous detachment 1996  . Postmenopausal    s/p hysterectomy for h/o cervical cancer 1982 (both ovaries taken at that time) now on hormonal replacement   . Postmenopausal HRT (hormone replacement therapy)   . PPD positive    history +PPD 1984, no treatment, no abnormal CXR  . Shortness of breath    with ambulation  . Stroke [facility])    TIA- April 2016 last one  . Supraventricular tachycardia, paroxysmal ([place])   . Urinary frequency   . Vaginal pruritus      Social History  Substance Use Topics  . Smoking status: Former Smoker    Packs/day: 1.00    Years: 25.00    Types: Cigarettes    Quit date: 04/17/1980  . Smokeless tobacco: Former Systems developer    Quit date: 03/16/1984     Comment: passive smoker, husband smoked  . Alcohol use No      Tobacco Cessation:Former smoker with a 25-pack-year history who quit in 1982   Past surgical hx, Family hx, Social hx all reviewed.  Current Outpatient Prescriptions on File Prior to Visit  Medication Sig  . albuterol (PROAIR HFA) 108 (90 Base) MCG/ACT inhaler Inhale 2 puffs into the lungs every 4 (four) hours as needed for wheezing or shortness of breath.  Marland Kitchen amLODipine (NORVASC) 2.5 MG tablet Take 1 tablet (2.5 mg total) by mouth 2 (two) times daily.  Marland Kitchen aspirin EC 81 MG tablet Take 81 mg by mouth daily.  Marland Kitchen atenolol (TENORMIN) 25 MG tablet Take 0.5 tablets (12.5 mg total) by mouth 2 (two) times daily.  . Cyanocobalamin (VITAMIN B12 PO) Take 15 mLs by mouth daily.   . fexofenadine (ALLEGRA) 180 MG tablet Take 1 tablet (180 mg total) by mouth daily. (Patient taking differently: Take 180 mg by mouth as needed. )  . fluticasone (FLONASE) 50 MCG/ACT nasal spray Place 2 sprays into both nostrils daily.  Marland Kitchen levothyroxine (SYNTHROID, LEVOTHROID) 75 MCG tablet Take 1 tablet (75 mcg total) by mouth daily before breakfast. Follow up visit needed for further refills.  Marland Kitchen omeprazole (PRILOSEC) 10 MG capsule Take 1 capsule (10 mg total) by mouth daily.  . simvastatin (ZOCOR) 10 MG tablet Take 1 tablet (10 mg total) by mouth every evening.  Marland Kitchen Spacer/Aero-Holding Chambers (BREATHERITE COLL SPACER ADULT) MISC Use with  Albuterol Inhaler.  . VOLTAREN 1 % GEL    No current facility-administered medications on file prior to visit.      Allergies  Allergen Reactions  . Benzonatate Nausea And Vomiting    "tessalon pearl"  . Sulfonamide Derivatives Itching and Rash  . Hctz [Hydrochlorothiazide] Other (See Comments)    Severe hyponatremia   . Amlodipine Other (See Comments)    Heart palpitations     Review Of Systems:  Constitutional:   No  weight loss, night sweats,  Fevers, chills, fatigue, or  lassitude.  HEENT:   No headaches,  Difficulty swallowing,  Tooth/dental problems, or  Sore throat,                 No sneezing, itching, ear ache, nasal congestion, post nasal drip,   CV:  No chest pain,  Orthopnea, PND, swelling in lower extremities, anasarca, dizziness, palpitations, syncope.   GI  No heartburn, indigestion, abdominal pain, nausea, vomiting, diarrhea, change in bowel habits, loss of appetite, bloody stools.   Resp: No shortness of breath with exertion or at rest.  No excess mucus, no productive cough,  No non-productive cough,  No coughing up of  blood.  No change in color of mucus.  No wheezing.  No chest wall deformity  Skin: no rash or lesions.  GU: no dysuria, change in color of urine, no urgency or frequency.  No flank pain, no hematuria   MS:  No joint pain or swelling.  No decreased range of motion.  No back pain.  Psych:  No change in mood or affect. No depression or anxiety.  No memory loss.   Vital Signs BP 120/72 (BP Location: Left Arm, Cuff Size: Normal)   Pulse 68   Ht 5\' 1"  (1.549 m)   Wt 132 lb 6.4 oz (60.1 kg)   SpO2 95%   BMI 25.02 kg/m    Physical Exam:  General- No distress,  A&Ox3, pleasant female ENT: No sinus tenderness, TM clear, pale nasal mucosa, no oral exudate,no post nasal drip, no LAN Cardiac: S1, S2, regular rate and rhythm, no murmur Chest: No wheeze/ rales/ dullness; no accessory muscle use, no nasal flaring, no sternal retractions Abd.: Soft Non-tender, nondistended ,bowel sounds positive Ext: No clubbing cyanosis, edema Neuro:  normal strength Skin: No rashes, warm and dry Psych: normal mood and behavior   Assessment/Plan  CAP (community acquired pneumonia) Resolution of community-acquired pneumonia per chest x-ray 08/31/2016 All symptoms have resolved Plan IMPRESSION: Underlying emphysematous change. No edema or consolidation. Stable cardiac silhouette. There is aortic atherosclerosis. There is bilateral carotid artery calcification.     Magdalen Spatz, NP 08/31/2016  10:37 PM

## 2016-08-31 NOTE — Assessment & Plan Note (Signed)
Resolution of community-acquired pneumonia per chest x-ray 08/31/2016 All symptoms have resolved Plan IMPRESSION: Underlying emphysematous change. No edema or consolidation. Stable cardiac silhouette. There is aortic atherosclerosis. There is bilateral carotid artery calcification.

## 2016-10-04 ENCOUNTER — Other Ambulatory Visit: Payer: Self-pay

## 2016-10-04 NOTE — Patient Outreach (Signed)
[facility]) Care Management  10/04/2016  [name] 29-Apr-1933 [id number]   Medication  Adherence call to Amy Rodriguez we call patient because she showing past due on losartan 100 mg patient said she is no longer on medication the doctor took her off, and put her on a different medication for blood pressure.   [place] [facility] Direct Dial [phone]  Fax [phone] [email or web address]

## 2016-11-03 ENCOUNTER — Ambulatory Visit (INDEPENDENT_AMBULATORY_CARE_PROVIDER_SITE_OTHER): Payer: Medicare Other | Admitting: Internal Medicine

## 2016-11-03 ENCOUNTER — Encounter: Payer: Self-pay | Admitting: Internal Medicine

## 2016-11-03 VITALS — BP 138/82 | HR 60 | Ht 61.0 in | Wt 155.0 lb

## 2016-11-03 DIAGNOSIS — E063 Autoimmune thyroiditis: Secondary | ICD-10-CM

## 2016-11-03 DIAGNOSIS — E038 Other specified hypothyroidism: Secondary | ICD-10-CM

## 2016-11-03 LAB — TSH: TSH: 2.96 u[IU]/mL (ref 0.35–4.50)

## 2016-11-03 LAB — T4, FREE: FREE T4: 0.86 ng/dL (ref 0.60–1.60)

## 2016-11-03 MED ORDER — LEVOTHYROXINE SODIUM 75 MCG PO TABS: 75.0000 ug | ORAL_TABLET | Freq: Every day | ORAL | 3 refills | Status: DC

## 2016-11-03 NOTE — Patient Instructions (Signed)
Please continue Levothyroxine 75 mcg.  Take the thyroid hormone every day, with water, at least 30 minutes before breakfast, separated by at least 4 hours from: - acid reflux medications - calcium - iron - multivitamins  Please return in 1 year.

## 2016-11-03 NOTE — Progress Notes (Signed)
[name], female   DOB: 04/15/1934, 83 y.o.   MRN: [id number]   HPI  Amy Rodriguez is a 83 y.o.-year-old female, returning for f/u for hypothyroidism 2/2 to Hashimoto's thyroiditis and R goiter. Last visit 83 years ago.  Pt. has been dx with hypothyroidism in 2011, and as Hashimoto's thyroiditis in 2016.  Pt is on levothyroxine 75 mcg daily, taken: - in am - fasting - at least 30 min from b'fast (usually 1h) - no Ca, Fe, MVI,  - + prn PPIs later in the day - 2-3x a week, at night - not on Biotin  I reviewed pt's thyroid tests: Lab Results  Component Value Date   TSH 2.13 11/04/2015   TSH 2.57 08/03/2015   TSH 2.47 10/20/2014   TSH 1.63 07/08/2014   TSH 1.15 12/19/2013   TSH 0.78 09/19/2013   TSH 8.120 (H) 07/26/2013   TSH 3.56 12/17/2012   TSH 2.374 03/18/2012   TSH 15.257 (H) 02/11/2012   FREET4 0.95 11/04/2015   FREET4 1.03 07/08/2014   FREET4 1.39 03/18/2012    Component     Latest Ref Rng & Units 07/08/2014 10/20/2014  Thyroperoxidase Ab SerPl-aCnc     <9 IU/mL >900 (H) >900 (H)   Enlarged right thyroid lobe - initially felt on palpation  Thyroid ultrasound (07/09/2014): enlarged right lobe, with hypoechoic aspect and moth-eaten appearance suggestive of Hashimoto's thyroiditis, and a normal left lobe. No nodules.  Pt denies: - feeling nodules in neck - hoarseness - dysphagia - choking - SOB with lying down  She also has HL, HTN, GERD, vit D def., bronchiectasis, TIA - small aneurysm - being followed by Dr Leonie Man. She had L TKR 2013.  ROS: Constitutional: + weight gain/no weight loss, no fatigue, no subjective hyperthermia, no subjective hypothermia Eyes: no blurry vision, no xerophthalmia ENT: no sore throat, + see HPI Cardiovascular: no CP/no SOB/no palpitations/no leg swelling Respiratory: no cough/no SOB/no wheezing Gastrointestinal: no N/no V/no D/no C/no acid reflux Musculoskeletal: no muscle aches/no joint aches Skin: no rashes, no hair  loss Neurological: no tremors/no numbness/no tingling/no dizziness  I reviewed pt's medications, allergies, PMH, social hx, family hx, and changes were documented in the history of present illness. Otherwise, unchanged from my initial visit note.  Past Medical History:  Diagnosis Date  . Arthritis    sed rate 10, RF, CCP pending  . Blood in stool   . Cervical cancer ([place])    s/p hysterectomy/oop  . DDD (degenerative disc disease), lumbosacral   . Depression    Due to husbands passing away 09/22/2011  . Diverticulosis of colon   . Environmental allergies   . GERD (gastroesophageal reflux disease)   . H pylori ulcer    not treated due to expense  10/2001  . Hand dermatitis   . HTN (hypertension)   . Hyperlipidemia   . Hypothyroidism    TSH 14.488 (11/2005)  . IBS (irritable bowel syndrome)   . Lactose intolerance   . Migraines    "get them very rarely now" (02/08/2012)  . Multiple pigmented nevi    last derm evaluation 01/2003  . OA (osteoarthritis)    multiple sites  . Osteoporosis    "borderline" (02/08/2012)  . Other seborrheic keratosis   . Pain in joint, lower leg   . Pneumonia    "couple times in my lifetime" (02/08/2012)  . PONV (postoperative nausea and vomiting)    "and takes me a long time to come out  under it" (02/08/2012)  . Posterior vitreous detachment 1996  . Postmenopausal    s/p hysterectomy for h/o cervical cancer 1982 (both ovaries taken at that time) now on hormonal replacement   . Postmenopausal HRT (hormone replacement therapy)   . PPD positive    history +PPD 1984, no treatment, no abnormal CXR  . Shortness of breath    with ambulation  . Stroke [facility])    TIA- April 2016 last one  . Supraventricular tachycardia, paroxysmal ([place])   . Urinary frequency   . Vaginal pruritus    Past Surgical History:  Procedure Laterality Date  . ABDOMINAL HYSTERECTOMY  1982  . CARDIOVASCULAR STRESS TEST  12/07/11   Normal nuclear stress test  . CATARACT  EXTRACTION W/ INTRAOCULAR LENS IMPLANT  ?1997   right  . COLONOSCOPY    . EYE SURGERY     cataract removal right eye  . TOTAL ABDOMINAL HYSTERECTOMY W/ BILATERAL SALPINGOOPHORECTOMY  1982  . TOTAL KNEE ARTHROPLASTY  02/08/2012   Procedure: TOTAL KNEE ARTHROPLASTY;  Surgeon: Hessie Dibble, MD;  Location: [place];  Service: Orthopedics;  Laterality: Left;   Social History   Social History  . Marital status: Widowed    Spouse name: N/A  . Number of children: 3  . Years of education: 12TH   Occupational History  . retired     Scientist, research (medical)  .  Retired   Social History Main Topics  . Smoking status: Former Smoker    Packs/day: 1.00    Years: 25.00    Types: Cigarettes    Quit date: 04/17/1980  . Smokeless tobacco: Former Systems developer    Quit date: 03/16/1984     Comment: passive smoker, husband smoked  . Alcohol use No  . Drug use: No  . Sexual activity: No   Other Topics Concern  . Not on file   Social History Narrative   Lives with son, [name] who has medical problems-they help each other. Her husband died October 15, 2022 after 65 years of marriage.  She has 2 other sons that live within 2 hrs from her. She does not drive-she uses [facility]. She used to work in Scientist, research (medical), retired.     Tobacco: 25 pack yr hx, quit 1985.   Current Outpatient Prescriptions on File Prior to Visit  Medication Sig Dispense Refill  . albuterol (PROAIR HFA) 108 (90 Base) MCG/ACT inhaler Inhale 2 puffs into the lungs every 4 (four) hours as needed for wheezing or shortness of breath. 1 Inhaler 3  . amLODipine (NORVASC) 2.5 MG tablet Take 1 tablet (2.5 mg total) by mouth 2 (two) times daily. 60 tablet 1  . aspirin EC 81 MG tablet Take 81 mg by mouth daily.    Marland Kitchen atenolol (TENORMIN) 25 MG tablet Take 0.5 tablets (12.5 mg total) by mouth 2 (two) times daily. 30 tablet 9  . Cyanocobalamin (VITAMIN B12 PO) Take 15 mLs by mouth daily.     . fexofenadine (ALLEGRA) 180 MG tablet Take 1 tablet (180 mg total) by mouth daily.  (Patient taking differently: Take 180 mg by mouth as needed. ) 30 tablet 11  . fluticasone (FLONASE) 50 MCG/ACT nasal spray Place 2 sprays into both nostrils daily. 16 g 2  . levothyroxine (SYNTHROID, LEVOTHROID) 75 MCG tablet Take 1 tablet (75 mcg total) by mouth daily before breakfast. Follow up visit needed for further refills. 90 tablet 1  . omeprazole (PRILOSEC) 10 MG capsule Take 1 capsule (10 mg total) by mouth daily. [place]  capsule 2  . simvastatin (ZOCOR) 10 MG tablet Take 1 tablet (10 mg total) by mouth every evening. 90 tablet 3  . Spacer/Aero-Holding Chambers (BREATHERITE COLL SPACER ADULT) MISC Use with  Albuterol Inhaler. 1 each 0  . VOLTAREN 1 % GEL   2   No current facility-administered medications on file prior to visit.    Allergies  Allergen Reactions  . Benzonatate Nausea And Vomiting    "tessalon pearl"  . Sulfonamide Derivatives Itching and Rash  . Hctz [Hydrochlorothiazide] Other (See Comments)    Severe hyponatremia   . Amlodipine Other (See Comments)    Heart palpitations    Family History  Problem Relation Age of Onset  . Breast cancer Mother 38  . Lung cancer Brother 14  . Cancer Brother        lung  . Heart failure Father        congestive  . Heart disease Father   . Epilepsy Son   . Kidney disease Son        tuberous sclerosis, both kidneys removed, has transplant  . Diabetes Maternal Uncle   . Diabetes Paternal Aunt   . Cancer Maternal Grandmother        breast  . Heart disease Son        [place] white  . COPD Son   . Rectal cancer Maternal Aunt   . Colon cancer Neg Hx   . Esophageal cancer Neg Hx   . Stomach cancer Neg Hx    PE: BP 138/82 (BP Location: Left Arm, Patient Position: Sitting)   Pulse 60   Ht 5\' 1"  (1.549 m)   Wt 155 lb (70.3 kg)   SpO2 92%   BMI 29.29 kg/m  Body mass index is 29.29 kg/m. Wt Readings from Last 3 Encounters:  11/03/16 155 lb (70.3 kg)  08/31/16 132 lb 6.4 oz (60.1 kg)  08/07/16 152 lb 6.4 oz (69.1 kg)    Constitutional: overweight, in NAD Eyes: PERRLA, EOMI, no exophthalmos ENT: moist mucous membranes, + R thyromegaly, no cervical lymphadenopathy Cardiovascular: RRR, No MRG Respiratory: CTA B Gastrointestinal: abdomen soft, NT, ND, BS+ Musculoskeletal: no deformities, strength intact in all 4 Skin: moist, warm, no rashes Neurological: no tremor with outstretched hands, DTR normal in all 4  ASSESSMENT: 1. Hypothyroidism - 2/2 Hashimoto's thyroiditis  2. Goiter - Thyroid U/S (07/09/2014):   Right thyroid lobe: 7.6 x 3.3 x 3.6 cm. The right thyroid lobe is diffusely enlarged and hypoechoic. Right thyroid tissue is mildly lobular but there is not a focal nodule. (honeycomb appearance)   Left thyroid lobe: 5.1 x 2.2 x 2.2 cm. Left thyroid tissue is heterogeneous but more hyperechoic than the right thyroid lobe and isthmus. There is not a focal nodule. Left thyroid tissue is very vascular.   Isthmus Thickness: 0.8 cm. Isthmus is prominent for size and has a similar hypoechoic appearance as the right thyroid lobe.   Lymphadenopathy None visualized.    PLAN:  1. Patient with several years of Hashimoto's hypothyroidism, with good control - latest thyroid labs reviewed with pt >> normal 1 year ago - she continues on LT4 75 mcg daily - pt feels good on this dose, and is asymptomatic - we discussed about taking the thyroid hormone every day, with water, >30 minutes before breakfast, separated by >4 hours from acid reflux medications, calcium, iron, multivitamins. Pt. is taking it correctly - will check thyroid tests today: TSH and fT4 - If labs are abnormal, she will  need to return for repeat TFTs in 1.5 months - OTW, RTC in 1 year   2. Right thyroid lobe enlargement - I reviewed the images of her thyroid ultrasound from 2016 >> large right thyroid lobe, however, without nodules and heterogeneous aspect, consistent with Hashimoto thyroiditis. It also contains septations and  it is hypoechoic. - She denies neck compression symptoms - We do not need to repeat the ultrasound that she starts having dysphagia or other neck compression symptoms.  Needs refills.  Office Visit on 11/03/2016  Component Date Value Ref Range Status  . TSH 11/03/2016 2.96  0.35 - 4.50 uIU/mL Final  . Free T4 11/03/2016 0.86  0.60 - 1.60 ng/dL Final   Comment: Specimens from patients who are undergoing biotin therapy and /or ingesting biotin supplements may contain high levels of biotin.  The higher biotin concentration in these specimens interferes with this Free T4 assay.  Specimens that contain high levels  of biotin may cause false high results for this Free T4 assay.  Please interpret results in light of the total clinical presentation of the patient.     Normal TFTs. Continue current dose of levothyroxine.  Philemon Kingdom, MD PhD [facility]

## 2016-11-06 ENCOUNTER — Ambulatory Visit: Payer: Medicare Other | Attending: Family Medicine | Admitting: Audiology

## 2016-11-06 DIAGNOSIS — H903 Sensorineural hearing loss, bilateral: Secondary | ICD-10-CM | POA: Diagnosis not present

## 2016-11-06 DIAGNOSIS — H918X9 Other specified hearing loss, unspecified ear: Secondary | ICD-10-CM | POA: Insufficient documentation

## 2016-11-06 DIAGNOSIS — H93299 Other abnormal auditory perceptions, unspecified ear: Secondary | ICD-10-CM | POA: Diagnosis not present

## 2016-11-06 DIAGNOSIS — H9313 Tinnitus, bilateral: Secondary | ICD-10-CM | POA: Insufficient documentation

## 2016-11-06 DIAGNOSIS — H9190 Unspecified hearing loss, unspecified ear: Secondary | ICD-10-CM

## 2016-11-06 NOTE — Procedures (Signed)
[facility]  [place], [place] [address]  [phone]   Audiological Evaluation Patient Name: Amy Rodriguez   Status: Outpatient   DOB: [birth year]    Diagnosis: Hearing Loss                Tinnitus, bilateral MRN: [id number] Date:  11/06/2016     Referent: Ma Hillock, DO  History: [name] was seen for an audiological evaluation. Primary Concern: Hearing loss on the right side for the past 2 years.  About two years ago the right ear "felt like I had gone [place] in the [place] for about two months". That feeling "gradually went away, but continue to not hear well". Sometimes has "right ear pain" in the eustachian tube area. Pain: None History of ear infections:  N (has sinus problems) History of ear surgery: N  History of dizziness/vertigo:   N History of balance issues:   N Tinnitus: Y - both ears. Sounds like crickets, all of the time, but only notice it when its quiet. Sound sensitivity: Y - with loud music feel like "ears are pulsing".  Have the music turned up too loud at church. History of hypertension: Y - controlled with medication. History of diabetes:  N Family history of hearing loss: On mothers side: grandmother and two sisters.    Evaluation: Conventional pure tone audiometry from 250Hz  - 8000Hz  with using insert earphones.  Hearing Thresholds:25-35 dBHL at 250Hz ; 30-40 dbHL from 500Hz  - 3000Hz ; 35-45 dbHL at 4000Hz  and 60 dBHL at 8000Hz . The hearing loss is sensorineural bilaterally. Reliability is good Speech reception levels (repeating words near threshold) using recorded spondee word lists:  Right ear: 35 dBHL.  Left ear:  30 dBHL Word recognition (at comfortably loud volumes) using recorded NU-6 word lists, in quiet.  Right ear: 96% at 65 dBHL.  Left ear:   96% at 60 dBHL Word recognition in minimal background noise:  +5 dBHL  Right ear: 48%                              Left ear:  56%  Tympanometry  shows normal middle ear volume, pressure and compliance (Type A) with ipsilateral acoustic reflexes from 500Hz  - 4000hz  of 85-90 dB.   CONCLUSION:     SHI GROSE has a mild sensorineural hearing loss throughout most of the speech range that slopes to a moderate to moderately severe hearing loss at 8000Hz  bilaterally. She has excellent word recognition in queit that drops to poor in minimal background noise in each ear at normal conversational speech levels.  She also has recruitment or sound sensitivity from damage to the cochlea in each ear. Deryl may be a hearing aid candidate at this time, but she wishes to monitor her hearing at this time.  Further evaluation by an ENT is recommended because of the persistent right ear/eustachian tube pain and significant tinnitus in each ear. The test results were discussed and [name] counseled regarding using a noise masker or creating white noise with a fan to minimize the disturbing effects of tinnitus. She scored MILD on the Tinnitus Handicap Inventory.   RECOMMENDATIONS: 1.   Refer to ENT because of tinnitus and persistent right ear/eustachian tube pain. 2.   Monitor hearing closely with a repeat audiological evaluation in 6 months (earlier if there is any change in hearing or ear pressure).  This appointment has  been scheduled May 09 2017 at 3pm. 3. To minimize the adverse effects of tinnitus 1) avoid quiet  2) use noise maskers at home such as a sound machine, quiet music, a fan or other background noise at a volume just loud enough to mask the high pitched tinnitus. 3) If the tinnitus becomes more bothersome, adversely affecting your sleep or concentration, contact your physician,  seek additional medical help by an ENT for further treatment of your tinnitus. 4.  Strategies that help improve hearing include: A) Face the speaker directly. Optimal is having the speakers face well - lit.  Being within 3-6 feet of the speaker will enhance  word recognition. B) Avoid having the speaker back-lit as this will minimize the ability to use cues from lip-reading, facial expression and gestures. C)  Word recognition is poorer in background noise. For optimal word recognition, turn off the TV, radio or noisy fan when engaging in conversation. In a restaurant, try to sit away from noise sources and close to the primary speaker.  D)  Ask for topic clarification from time to time in order to remain in the conversation.  Most people don't mind repeating or clarifying a point when asked.  If needed, explain the difficulty hearing in background noise or hearing loss. 5.   Use hearing protection during noisy activities (including loud church services).    Musician's plugs, are available from [employer] for music related hearing protection because there is no distortion.  Other hearing protection, such as sponge plugs (available at pharmacies) or earmuffs (available at sporting goods stores or department stores such as Paediatric nurse) are useful for noisy activities and venues.  Leeya Rusconi L. Heide Spark, Au.D., CCC-A Doctor of Audiology  11/06/2016 cc: Howard Pouch A, DO    \

## 2016-11-16 ENCOUNTER — Other Ambulatory Visit: Payer: Self-pay | Admitting: Family Medicine

## 2016-11-16 DIAGNOSIS — Z1231 Encounter for screening mammogram for malignant neoplasm of breast: Secondary | ICD-10-CM

## 2016-11-27 ENCOUNTER — Ambulatory Visit
Admission: RE | Admit: 2016-11-27 | Discharge: 2016-11-27 | Disposition: A | Discharge status: Home / Self Care | Payer: Medicare Other | Source: Ambulatory Visit | Attending: Family Medicine | Admitting: Family Medicine

## 2016-11-27 DIAGNOSIS — Z1231 Encounter for screening mammogram for malignant neoplasm of breast: Secondary | ICD-10-CM | POA: Diagnosis not present

## 2016-12-04 ENCOUNTER — Other Ambulatory Visit: Payer: Self-pay | Admitting: Cardiology

## 2016-12-04 DIAGNOSIS — I6523 Occlusion and stenosis of bilateral carotid arteries: Secondary | ICD-10-CM

## 2016-12-12 ENCOUNTER — Ambulatory Visit (HOSPITAL_COMMUNITY)
Admission: RE | Admit: 2016-12-12 | Discharge: 2016-12-12 | Disposition: A | Discharge status: Home / Self Care | Payer: Medicare Other | Source: Ambulatory Visit | Attending: Cardiovascular Disease | Admitting: Cardiovascular Disease

## 2016-12-12 DIAGNOSIS — I6523 Occlusion and stenosis of bilateral carotid arteries: Secondary | ICD-10-CM | POA: Diagnosis not present

## 2016-12-20 ENCOUNTER — Other Ambulatory Visit: Payer: Self-pay | Admitting: Family Medicine

## 2016-12-20 ENCOUNTER — Encounter: Payer: Self-pay | Admitting: *Deleted

## 2016-12-26 NOTE — Progress Notes (Signed)
HPI: FU SVT. Nuclear study in August of 2013 showed an ejection fraction of 76% and normal perfusion. Holter 12/14 showed sinus with pacs, pvcs and rare couplet. Echo 4/15 showed normal LV function. Carotid dopplers 8/18 showed 1-39 bilateral stenosis. Since last seen, she notes some dyspnea on exertion but no orthopnea, PND, pedal edema, syncope or chest pain. Occasional skip but no sustained palpitations.  Current Outpatient Prescriptions  Medication Sig Dispense Refill  . albuterol (PROAIR HFA) 108 (90 Base) MCG/ACT inhaler Inhale 2 puffs into the lungs every 4 (four) hours as needed for wheezing or shortness of breath. 1 Inhaler 3  . amLODipine (NORVASC) 2.5 MG tablet Take 1 tablet (2.5 mg total) by mouth 2 (two) times daily. Needs office visit prior to anymore refills. 30 tablet 0  . aspirin EC 81 MG tablet Take 81 mg by mouth daily.    Marland Kitchen atenolol (TENORMIN) 25 MG tablet Take 0.5 tablets (12.5 mg total) by mouth 2 (two) times daily. 30 tablet 9  . Cyanocobalamin (VITAMIN B12 PO) Take 15 mLs by mouth daily.     . fexofenadine (ALLEGRA) 180 MG tablet Take 1 tablet (180 mg total) by mouth daily. (Patient taking differently: Take 180 mg by mouth as needed. ) 30 tablet 11  . fluticasone (FLONASE) 50 MCG/ACT nasal spray Place 2 sprays into both nostrils daily. 16 g 2  . levothyroxine (SYNTHROID, LEVOTHROID) 75 MCG tablet Take 1 tablet (75 mcg total) by mouth daily before breakfast. 90 tablet 3  . omeprazole (PRILOSEC) 10 MG capsule Take 1 capsule (10 mg total) by mouth daily. 90 capsule 2  . simvastatin (ZOCOR) 10 MG tablet Take 1 tablet (10 mg total) by mouth every evening. 90 tablet 3  . Spacer/Aero-Holding Chambers (BREATHERITE COLL SPACER ADULT) MISC Use with  Albuterol Inhaler. 1 each 0  . VOLTAREN 1 % GEL   2   No current facility-administered medications for this visit.      Past Medical History:  Diagnosis Date  . Arthritis    sed rate 10, RF, CCP pending  . Blood in stool     . Cervical cancer ([place])    s/p hysterectomy/oop  . DDD (degenerative disc disease), lumbosacral   . Depression    Due to husbands passing away 09/22/2011  . Diverticulosis of colon   . Environmental allergies   . GERD (gastroesophageal reflux disease)   . H pylori ulcer    not treated due to expense  10/2001  . Hand dermatitis   . HTN (hypertension)   . Hyperlipidemia   . Hypothyroidism    TSH 14.488 (11/2005)  . IBS (irritable bowel syndrome)   . Lactose intolerance   . Migraines    "get them very rarely now" (02/08/2012)  . Multiple pigmented nevi    last derm evaluation 01/2003  . OA (osteoarthritis)    multiple sites  . Osteoporosis    "borderline" (02/08/2012)  . Other seborrheic keratosis   . Pain in joint, lower leg   . Pneumonia    "couple times in my lifetime" (02/08/2012)  . PONV (postoperative nausea and vomiting)    "and takes me a long time to come out under it" (02/08/2012)  . Posterior vitreous detachment 1996  . Postmenopausal    s/p hysterectomy for h/o cervical cancer 1982 (both ovaries taken at that time) now on hormonal replacement   . Postmenopausal HRT (hormone replacement therapy)   . PPD positive    history +PPD  1984, no treatment, no abnormal CXR  . Shortness of breath    with ambulation  . Stroke [facility])    TIA- April 2016 last one  . Supraventricular tachycardia, paroxysmal ([place])   . Urinary frequency   . Vaginal pruritus     Past Surgical History:  Procedure Laterality Date  . ABDOMINAL HYSTERECTOMY  1982  . CARDIOVASCULAR STRESS TEST  12/07/11   Normal nuclear stress test  . CATARACT EXTRACTION W/ INTRAOCULAR LENS IMPLANT  ?1997   right  . COLONOSCOPY    . EYE SURGERY     cataract removal right eye  . TOTAL ABDOMINAL HYSTERECTOMY W/ BILATERAL SALPINGOOPHORECTOMY  1982  . TOTAL KNEE ARTHROPLASTY  02/08/2012   Procedure: TOTAL KNEE ARTHROPLASTY;  Surgeon: Hessie Dibble, MD;  Location: [place];  Service: Orthopedics;  Laterality: Left;     Social History   Social History  . Marital status: Widowed    Spouse name: N/A  . Number of children: 3  . Years of education: 12TH   Occupational History  . retired     Scientist, research (medical)  .  Retired   Social History Main Topics  . Smoking status: Former Smoker    Packs/day: 1.00    Years: 25.00    Types: Cigarettes    Quit date: 04/17/1980  . Smokeless tobacco: Former Systems developer    Quit date: 03/16/1984     Comment: passive smoker, husband smoked  . Alcohol use No  . Drug use: No  . Sexual activity: No   Other Topics Concern  . Not on file   Social History Narrative   Lives with son, [name] who has medical problems-they help each other. Her husband died October 17, 2022 after 61 years of marriage.  She has 2 other sons that live within 2 hrs from her. She does not drive-she uses [facility] [facility] service. She used to work in Scientist, research (medical), retired.     Tobacco: 25 pack yr hx, quit 1985.    Family History  Problem Relation Age of Onset  . Breast cancer Mother 15  . Lung cancer Brother 6  . Cancer Brother        lung  . Heart failure Father        congestive  . Heart disease Father   . Epilepsy Son   . Kidney disease Son        tuberous sclerosis, both kidneys removed, has transplant  . Diabetes Maternal Uncle   . Diabetes Paternal Aunt   . Cancer Maternal Grandmother        breast  . Breast cancer Maternal Grandmother   . Heart disease Son        [place] white  . COPD Son   . Rectal cancer Maternal Aunt   . Colon cancer Neg Hx   . Esophageal cancer Neg Hx   . Stomach cancer Neg Hx     ROS: no fevers or chills, productive cough, hemoptysis, dysphasia, odynophagia, melena, hematochezia, dysuria, hematuria, rash, seizure activity, orthopnea, PND, pedal edema, claudication. Remaining systems are negative.  Physical Exam: Well-developed well-nourished in no acute distress.  Skin is warm and dry.  HEENT is normal.  Neck is supple.  Chest is clear to auscultation with normal expansion.   Cardiovascular exam is regular rate and rhythm.  Abdominal exam nontender or distended. No masses palpated. Extremities show no edema. neuro grossly intact  ECG- Normal sinus rhythm at a rate of 61. Right bundle branch block. personally reviewed  A/P  1  Supraventricular tachycardia-patient has had no recent episodes. Continue low-dose beta blocker. She has not tolerated higher doses in the past because of fatigue.  2 hypertension-blood pressure is controlled. Continue present medications.  3 hyperlipidemia-continue statin. Managed by primary care.  4 carotid artery disease-continue aspirin and statin. Mild on most recent carotid Dopplers.  Kirk Ruths, MD

## 2017-01-01 ENCOUNTER — Ambulatory Visit (INDEPENDENT_AMBULATORY_CARE_PROVIDER_SITE_OTHER): Payer: Medicare Other | Admitting: Cardiology

## 2017-01-01 ENCOUNTER — Encounter: Payer: Self-pay | Admitting: Cardiology

## 2017-01-01 VITALS — BP 148/82 | HR 61 | Ht 61.0 in | Wt 157.0 lb

## 2017-01-01 DIAGNOSIS — E785 Hyperlipidemia, unspecified: Secondary | ICD-10-CM | POA: Diagnosis not present

## 2017-01-01 DIAGNOSIS — I471 Supraventricular tachycardia: Secondary | ICD-10-CM

## 2017-01-01 DIAGNOSIS — E7800 Pure hypercholesterolemia, unspecified: Secondary | ICD-10-CM

## 2017-01-01 MED ORDER — AMLODIPINE BESYLATE 2.5 MG PO TABS: 2.5000 mg | ORAL_TABLET | Freq: Two times a day (BID) | ORAL | 3 refills | Status: DC

## 2017-01-01 MED ORDER — ATENOLOL 25 MG PO TABS: 12.5000 mg | ORAL_TABLET | Freq: Two times a day (BID) | ORAL | 3 refills | Status: DC

## 2017-01-01 MED ORDER — SIMVASTATIN 10 MG PO TABS: 10.0000 mg | ORAL_TABLET | Freq: Every evening | ORAL | 3 refills | Status: DC

## 2017-01-01 NOTE — Patient Instructions (Signed)
Your physician wants you to follow-up in: ONE YEAR WITH DR CRENSHAW You will receive a reminder letter in the mail two months in advance. If you don't receive a letter, please call our office to schedule the follow-up appointment.   If you need a refill on your cardiac medications before your next appointment, please call your pharmacy.  

## 2017-01-10 DIAGNOSIS — Z961 Presence of intraocular lens: Secondary | ICD-10-CM | POA: Diagnosis not present

## 2017-01-10 DIAGNOSIS — H35371 Puckering of macula, right eye: Secondary | ICD-10-CM | POA: Diagnosis not present

## 2017-01-10 DIAGNOSIS — H25812 Combined forms of age-related cataract, left eye: Secondary | ICD-10-CM | POA: Diagnosis not present

## 2017-01-10 DIAGNOSIS — H52203 Unspecified astigmatism, bilateral: Secondary | ICD-10-CM | POA: Diagnosis not present

## 2017-01-11 ENCOUNTER — Ambulatory Visit: Payer: Medicare Other

## 2017-01-12 ENCOUNTER — Ambulatory Visit (INDEPENDENT_AMBULATORY_CARE_PROVIDER_SITE_OTHER): Payer: Medicare Other

## 2017-01-12 DIAGNOSIS — Z23 Encounter for immunization: Secondary | ICD-10-CM

## 2017-01-16 DIAGNOSIS — Z23 Encounter for immunization: Secondary | ICD-10-CM | POA: Diagnosis not present

## 2017-03-12 DIAGNOSIS — M25562 Pain in left knee: Secondary | ICD-10-CM | POA: Diagnosis not present

## 2017-03-28 ENCOUNTER — Other Ambulatory Visit: Payer: Self-pay | Admitting: *Deleted

## 2017-03-28 MED ORDER — ALBUTEROL SULFATE HFA 108 (90 BASE) MCG/ACT IN AERS: 2.0000 | INHALATION_SPRAY | RESPIRATORY_TRACT | 3 refills | Status: DC | PRN

## 2017-05-09 ENCOUNTER — Ambulatory Visit: Payer: Self-pay | Admitting: Audiology

## 2017-05-10 ENCOUNTER — Encounter: Payer: Self-pay | Admitting: Emergency Medicine

## 2017-05-10 ENCOUNTER — Ambulatory Visit (INDEPENDENT_AMBULATORY_CARE_PROVIDER_SITE_OTHER): Payer: Medicare Other | Admitting: Emergency Medicine

## 2017-05-10 DIAGNOSIS — J449 Chronic obstructive pulmonary disease, unspecified: Secondary | ICD-10-CM

## 2017-05-10 DIAGNOSIS — J479 Bronchiectasis, uncomplicated: Secondary | ICD-10-CM | POA: Diagnosis not present

## 2017-05-10 MED ORDER — TIOTROPIUM BROMIDE MONOHYDRATE 2.5 MCG/ACT IN AERS: 2.0000 | INHALATION_SPRAY | Freq: Every day | RESPIRATORY_TRACT | 0 refills | Status: DC

## 2017-05-10 NOTE — Addendum Note (Signed)
Addended by: Dolores Lory on: 05/10/2017 02:47 PM   Modules accepted: Orders

## 2017-05-10 NOTE — Patient Instructions (Addendum)
We will start Spiriva Respimat 2 puffs once a day.  If you benefit from this medication then we will write a prescription for your pharmacy and continue it. Keep albuterol available to use 2 puffs if needed for shortness of breath. Follow with Dr Lamonte Sakai in 1 month or next available to review your status on the new medication. We will consider performing repeat pulmonary function testing at some point in the future depending on how you respond to the medication

## 2017-05-10 NOTE — Progress Notes (Signed)
Subjective:    [name], female    DOB: [birth year], 84 y.o.   MRN: [id number]  HPI 84 year old woman, hx of tobacco use (25 pk-yrs), history of hypertension, SVT and emphysema based on CT 04/2014. Her PFT and CT were personally reviewed by me. Showed some RML bronchiectasis as well as emphysema. PFT 2015 show normal airflows with some evidence of obstruction after BD administration.  She has been followed by Dr Amy Rodriguez. She was being managed on Advair HFA + SABA prn. , and the Advair was stopped about a year ago. She still uses it prn. Doesn't use albuterol.   She has been dealing with a cough for the las several months, but it has cleared up in the last month, ? Allergies. Minimally productive of clear. Her BP med was changed in response to this. She is on allegra prn, fluticasone nasal spray prn.   ROV 05/09/16 -- This is a follow-up visit for patient with a history of mild obstructive lung disease, emphysematous change in bronchiectasis noted on CT scan the chest. At her last visit one year ago we tried stopping her Advair. She continued to have albuterol to use when necessary. She took her allegra through the allergy season, has uses some nasal steroid prn. She has daily cough, brings up some thick white mucous. She uses albuterol rarely, more in the summer than at other times.   ROV 05/10/17 --pleasant 84 year old woman with a history of bronchiectasis and COPD.  I last saw her one year ago.  She has been seen in our office in the interim, was treated for pneumonia in March 2018. She states that she has noticed more exertional SOB with hills, chores, showering. She is using albuterol about 1x a day. Seems to benefit her. Minimal wheeze or cough.    Review of Systems  Constitutional: Negative for activity change, fever and unexpected weight change.  HENT: Negative for congestion, postnasal drip, rhinorrhea and sneezing.   Respiratory: Positive for shortness of breath. Negative  for cough, chest tightness, wheezing and stridor.   Cardiovascular: Negative for chest pain.     Past Medical History:  Diagnosis Date  . Arthritis    sed rate 10, RF, CCP pending  . Blood in stool   . Cervical cancer ([place])    s/p hysterectomy/oop  . DDD (degenerative disc disease), lumbosacral   . Depression    Due to husbands passing away 09/22/2011  . Diverticulosis of colon   . Environmental allergies   . GERD (gastroesophageal reflux disease)   . H pylori ulcer    not treated due to expense  10/2001  . Hand dermatitis   . HTN (hypertension)   . Hyperlipidemia   . Hypothyroidism    TSH 14.488 (11/2005)  . IBS (irritable bowel syndrome)   . Lactose intolerance   . Migraines    "get them very rarely now" (02/08/2012)  . Multiple pigmented nevi    last derm evaluation 01/2003  . OA (osteoarthritis)    multiple sites  . Osteoporosis    "borderline" (02/08/2012)  . Other seborrheic keratosis   . Pain in joint, lower leg   . Pneumonia    "couple times in my lifetime" (02/08/2012)  . PONV (postoperative nausea and vomiting)    "and takes me a long time to come out under it" (02/08/2012)  . Posterior vitreous detachment 1996  . Postmenopausal    s/p hysterectomy for h/o cervical cancer 1982 (both ovaries taken at  that time) now on hormonal replacement   . Postmenopausal HRT (hormone replacement therapy)   . PPD positive    history +PPD 1984, no treatment, no abnormal CXR  . Shortness of breath    with ambulation  . Stroke [facility])    TIA- April 2016 last one  . Supraventricular tachycardia, paroxysmal ([place])   . Urinary frequency   . Vaginal pruritus      Family History  Problem Relation Age of Onset  . Breast cancer Mother 69  . Lung cancer Brother 52  . Cancer Brother        lung  . Heart failure Father        congestive  . Heart disease Father   . Epilepsy Son   . Kidney disease Son        tuberous sclerosis, both kidneys removed, has transplant  . Diabetes  Maternal Uncle   . Diabetes Paternal Aunt   . Cancer Maternal Grandmother        breast  . Breast cancer Maternal Grandmother   . Heart disease Son        [place] white  . COPD Son   . Rectal cancer Maternal Aunt   . Colon cancer Neg Hx   . Esophageal cancer Neg Hx   . Stomach cancer Neg Hx      Social History   Socioeconomic History  . Marital status: Widowed    Spouse name: Not on file  . Number of children: 3  . Years of education: 12TH  . Highest education level: Not on file  Social Needs  . Financial resource strain: Not on file  . Food insecurity - worry: Not on file  . Food insecurity - inability: Not on file  . Transportation needs - medical: Not on file  . Transportation needs - non-medical: Not on file  Occupational History  . Occupation: retired    Comment: Academic librarian: RETIRED  Tobacco Use  . Smoking status: Former Smoker    Packs/day: 1.00    Years: 25.00    Pack years: 25.00    Types: Cigarettes    Last attempt to quit: 04/17/1980    Years since quitting: 37.0  . Smokeless tobacco: Former Systems developer    Quit date: 03/16/1984  . Tobacco comment: passive smoker, husband smoked  Substance and Sexual Activity  . Alcohol use: No  . Drug use: No  . Sexual activity: No  Other Topics Concern  . Not on file  Social History Narrative   Lives with son, Amy Rodriguez who has medical problems-they help each other. Her husband died 09/30/22 after 33 years of marriage.  She has 2 other sons that live within 2 hrs from her. She does not drive-she uses [facility]. She used to work in Scientist, research (medical), retired.     Tobacco: 25 pack yr hx, quit 1985.     Allergies  Allergen Reactions  . Benzonatate Nausea And Vomiting    "tessalon pearl"  . Sulfonamide Derivatives Itching and Rash  . Hctz [Hydrochlorothiazide] Other (See Comments)    Severe hyponatremia      Outpatient Medications Prior to Visit  Medication Sig Dispense Refill  . albuterol (PROAIR HFA) 108 (90 Base)  MCG/ACT inhaler Inhale 2 puffs into the lungs every 4 (four) hours as needed for wheezing or shortness of breath. 1 Inhaler 3  . amLODipine (NORVASC) 2.5 MG tablet Take 1 tablet (2.5 mg total) by mouth 2 (two) times daily. 180 tablet  3  . aspirin EC 81 MG tablet Take 81 mg by mouth daily.    Marland Kitchen atenolol (TENORMIN) 25 MG tablet Take 0.5 tablets (12.5 mg total) by mouth 2 (two) times daily. 90 tablet 3  . fexofenadine (ALLEGRA) 180 MG tablet Take 1 tablet (180 mg total) by mouth daily. (Patient taking differently: Take 180 mg by mouth as needed. ) 30 tablet 11  . fluticasone (FLONASE) 50 MCG/ACT nasal spray Place 2 sprays into both nostrils daily. 16 g 2  . levothyroxine (SYNTHROID, LEVOTHROID) 75 MCG tablet Take 1 tablet (75 mcg total) by mouth daily before breakfast. 90 tablet 3  . omeprazole (PRILOSEC) 10 MG capsule Take 1 capsule (10 mg total) by mouth daily. 90 capsule 2  . simvastatin (ZOCOR) 10 MG tablet Take 1 tablet (10 mg total) by mouth every evening. 90 tablet 3  . Spacer/Aero-Holding Chambers (BREATHERITE COLL SPACER ADULT) MISC Use with  Albuterol Inhaler. 1 each 0  . VOLTAREN 1 % GEL   2  . Cyanocobalamin (VITAMIN B12 PO) Take 15 mLs by mouth daily.      No facility-administered medications prior to visit.          Objective:   Physical Exam  Vitals:   05/10/17 1404  BP: 132/68  Pulse: (!) 59  SpO2: 94%  Weight: 160 lb 12.8 oz (72.9 kg)  Height: 5\' 1"  (1.549 m)  Gen: Pleasant, well-nourished, in no distress,  normal affect  ENT: No lesions,  mouth clear,  oropharynx clear, no postnasal drip  Neck: No JVD, no TMG, no carotid bruits  Lungs: No use of accessory muscles, clear without rales or rhonchi  Cardiovascular: brady but regular, heart sounds normal, no murmur or gallops, no peripheral edema  Musculoskeletal: No deformities, no cyanosis or clubbing  Neuro: alert, non focal  Skin: Warm, no lesions or rashes   CT scan 04/22/14 --  EXAM: CT CHEST WITHOUT  CONTRAST  TECHNIQUE: Multidetector CT imaging of the chest was performed following the standard protocol without intravenous contrast. High resolution imaging of the lungs, as well as inspiratory and expiratory imaging, was performed.  COMPARISON: None.  FINDINGS: Right lobe of the thyroid is asymmetrically enlarged. Mediastinal lymph nodes are not enlarged by CT size criteria. Hilar regions are difficult to definitively evaluate without IV contrast. No axillary adenopathy. Ascending aorta measures 3.6 cm. Pulmonary arteries are enlarged. Three-vessel coronary artery calcification. Heart is at the upper limits of normal in size. No pericardial effusion.  Centrilobular emphysema appears moderate in severity. Mild peribronchovascular nodularity in the upper lobes, right greater than left, is likely postinfectious in etiology. Mild bronchiectasis is seen in association. No subpleural reticulation, traction bronchiectasis/ bronchiolectasis, ground-glass, architectural distortion or honeycombing. No air trapping. No pleural fluid. Airway is unremarkable.  Incidental imaging of the upper abdomen shows the visualized portions of the liver, adrenal glands, kidneys, spleen and stomach to be grossly unremarkable.  No worrisome lytic or sclerotic lesions.  IMPRESSION: 1. Mild bronchiectasis is seen in association with probable post infectious scarring in the upper lobes, right greater than left. 2. Moderate centrilobular emphysema. No evidence of superimposed interstitial lung disease. 3. Ascending aorta is at the upper limits of normal in size.      Assessment & Plan:  COPD (chronic obstructive pulmonary disease) ([place]) Currently on no therapy.  She has had exacerbations in the last year.  I would like to do a trial of LAMA to see if she will benefit.  Consider repeat pulmonary function  testing at some point in the future.  I do not believe it would change our plans  today.  Bronchiectasis without complication ([place]) Mild based on most recent CT scan.  We will plan repeat CT timing at a future visit.  Baltazar Apo, MD, PhD 05/10/2017, 2:31 PM [facility] [phone] or if no answer [phone]

## 2017-05-10 NOTE — Assessment & Plan Note (Signed)
Currently on no therapy.  She has had exacerbations in the last year.  I would like to do a trial of LAMA to see if she will benefit.  Consider repeat pulmonary function testing at some point in the future.  I do not believe it would change our plans today.

## 2017-05-10 NOTE — Assessment & Plan Note (Signed)
Mild based on most recent CT scan.  We will plan repeat CT timing at a future visit.

## 2017-05-17 ENCOUNTER — Encounter: Payer: Self-pay | Admitting: Family Medicine

## 2017-05-18 ENCOUNTER — Ambulatory Visit: Payer: Medicare Other | Admitting: Family Medicine

## 2017-05-18 DIAGNOSIS — Z0289 Encounter for other administrative examinations: Secondary | ICD-10-CM

## 2017-05-30 ENCOUNTER — Other Ambulatory Visit: Payer: Self-pay | Admitting: *Deleted

## 2017-05-30 MED ORDER — OMEPRAZOLE 10 MG PO CPDR: 10.0000 mg | DELAYED_RELEASE_CAPSULE | Freq: Every day | ORAL | 0 refills | Status: DC

## 2017-06-11 ENCOUNTER — Encounter: Payer: Self-pay | Admitting: Emergency Medicine

## 2017-06-11 ENCOUNTER — Ambulatory Visit (INDEPENDENT_AMBULATORY_CARE_PROVIDER_SITE_OTHER): Payer: Medicare Other | Admitting: Emergency Medicine

## 2017-06-11 VITALS — BP 136/74 | HR 60 | Ht 61.0 in | Wt 157.0 lb

## 2017-06-11 DIAGNOSIS — J449 Chronic obstructive pulmonary disease, unspecified: Secondary | ICD-10-CM

## 2017-06-11 MED ORDER — TIOTROPIUM BROMIDE MONOHYDRATE 2.5 MCG/ACT IN AERS: 2.0000 | INHALATION_SPRAY | Freq: Every day | RESPIRATORY_TRACT | 5 refills | Status: DC

## 2017-06-11 NOTE — Assessment & Plan Note (Signed)
COPD, severity unclear.  Mild on pulmonary function testing from 2015.  She may have benefited some from Spiriva Respimat.  We have agreed to restart until her follow-up visit to see if she feels it is helping her.  We will perform pulmonary function testing to compare with 2015.  Walking oximetry today to ensure that she does not have occult desaturation given her continued exertional dyspnea.  She did have some cough that may be associated with a Stiolto.  If so then we will factor this in when we decide whether to continue her on scheduled bronchodilators.

## 2017-06-11 NOTE — Progress Notes (Signed)
Subjective:    [name], female    DOB: [birth year], 84 y.o.   MRN: [id number]  COPD  She complains of shortness of breath. There is no cough or wheezing. Pertinent negatives include no chest pain, fever, postnasal drip, rhinorrhea or sneezing. Her past medical history is significant for COPD.   84 year old woman, hx of tobacco use (25 pk-yrs), history of hypertension, SVT and emphysema based on CT 04/2014. Her PFT and CT were personally reviewed by me. Showed some RML bronchiectasis as well as emphysema. PFT 2015 show normal airflows with some evidence of obstruction after BD administration.  She has been followed by Dr Gwenette Greet. She was being managed on Advair HFA + SABA prn. , and the Advair was stopped about a year ago. She still uses it prn. Doesn't use albuterol.   She has been dealing with a cough for the las several months, but it has cleared up in the last month, ? Allergies. Minimally productive of clear. Her BP med was changed in response to this. She is on allegra prn, fluticasone nasal spray prn.   ROV 05/09/16 -- This is a follow-up visit for patient with a history of mild obstructive lung disease, emphysematous change in bronchiectasis noted on CT scan the chest. At her last visit one year ago we tried stopping her Advair. She continued to have albuterol to use when necessary. She took her allegra through the allergy season, has uses some nasal steroid prn. She has daily cough, brings up some thick white mucous. She uses albuterol rarely, more in the summer than at other times.   ROV 05/10/17 --pleasant 84 year old woman with a history of bronchiectasis and COPD.  I last saw her one year ago.  She has been seen in our office in the interim, was treated for pneumonia in March 2018. She states that she has noticed more exertional SOB with hills, chores, showering. She is using albuterol about 1x a day. Seems to benefit her. Minimal wheeze or cough.   ROV 06/11/17 --patient  follows up today for COPD and bronchiectasis.  At her last visit I started Spiriva to see if she would benefit. She reports that it helps her some, but she still has exertional SOB. She has some am cough, minimally productive.    Review of Systems  Constitutional: Negative for activity change, fever and unexpected weight change.  HENT: Negative for congestion, postnasal drip, rhinorrhea and sneezing.   Respiratory: Positive for shortness of breath. Negative for cough, chest tightness, wheezing and stridor.   Cardiovascular: Negative for chest pain.     Past Medical History:  Diagnosis Date  . Arthritis    sed rate 10, RF, CCP pending  . Blood in stool   . Cervical cancer ([place])    s/p hysterectomy/oop  . DDD (degenerative disc disease), lumbosacral   . Depression    Due to husbands passing away 09/22/2011  . Diverticulosis of colon   . Environmental allergies   . GERD (gastroesophageal reflux disease)   . H pylori ulcer    not treated due to expense  10/2001  . Hand dermatitis   . HTN (hypertension)   . Hyperlipidemia   . Hypothyroidism    TSH 14.488 (11/2005)  . IBS (irritable bowel syndrome)   . Lactose intolerance   . Migraines    "get them very rarely now" (02/08/2012)  . Multiple pigmented nevi    last derm evaluation 01/2003  . OA (osteoarthritis)  multiple sites  . Osteoporosis    "borderline" (02/08/2012)  . Other seborrheic keratosis   . Pain in joint, lower leg   . Pneumonia    "couple times in my lifetime" (02/08/2012)  . PONV (postoperative nausea and vomiting)    "and takes me a long time to come out under it" (02/08/2012)  . Posterior vitreous detachment 1996  . Postmenopausal    s/p hysterectomy for h/o cervical cancer 1982 (both ovaries taken at that time) now on hormonal replacement   . Postmenopausal HRT (hormone replacement therapy)   . PPD positive    history +PPD 1984, no treatment, no abnormal CXR  . Shortness of breath    with ambulation  .  Stroke [facility])    TIA- April 2016 last one  . Supraventricular tachycardia, paroxysmal ([place])   . Urinary frequency   . Vaginal pruritus      Family History  Problem Relation Age of Onset  . Breast cancer Mother 65  . Lung cancer Brother 79  . Cancer Brother        lung  . Heart failure Father        congestive  . Heart disease Father   . Epilepsy Son   . Kidney disease Son        tuberous sclerosis, both kidneys removed, has transplant  . Diabetes Maternal Uncle   . Diabetes Paternal Aunt   . Cancer Maternal Grandmother        breast  . Breast cancer Maternal Grandmother   . Heart disease Son        [place] white  . COPD Son   . Rectal cancer Maternal Aunt   . Colon cancer Neg Hx   . Esophageal cancer Neg Hx   . Stomach cancer Neg Hx      Social History   Socioeconomic History  . Marital status: Widowed    Spouse name: Not on file  . Number of children: 3  . Years of education: 12TH  . Highest education level: Not on file  Social Needs  . Financial resource strain: Not on file  . Food insecurity - worry: Not on file  . Food insecurity - inability: Not on file  . Transportation needs - medical: Not on file  . Transportation needs - non-medical: Not on file  Occupational History  . Occupation: retired    Comment: Academic librarian: RETIRED  Tobacco Use  . Smoking status: Former Smoker    Packs/day: 1.00    Years: 25.00    Pack years: 25.00    Types: Cigarettes    Last attempt to quit: 04/17/1980    Years since quitting: 37.1  . Smokeless tobacco: Former Systems developer    Quit date: 03/16/1984  . Tobacco comment: passive smoker, husband smoked  Substance and Sexual Activity  . Alcohol use: No  . Drug use: No  . Sexual activity: No  Other Topics Concern  . Not on file  Social History Narrative   Lives with son, [name] who has medical problems-they help each other. Her husband died 2022-10-21 after 32 years of marriage.  She has 2 other sons that live within 2 hrs from  her. She does not drive-she uses [facility]. She used to work in Scientist, research (medical), retired.     Tobacco: 25 pack yr hx, quit 1985.     Allergies  Allergen Reactions  . Benzonatate Nausea And Vomiting    "tessalon pearl"  . Sulfonamide Derivatives Itching  and Rash  . Hctz [Hydrochlorothiazide] Other (See Comments)    Severe hyponatremia      Outpatient Medications Prior to Visit  Medication Sig Dispense Refill  . albuterol (PROAIR HFA) 108 (90 Base) MCG/ACT inhaler Inhale 2 puffs into the lungs every 4 (four) hours as needed for wheezing or shortness of breath. 1 Inhaler 3  . amLODipine (NORVASC) 2.5 MG tablet Take 1 tablet (2.5 mg total) by mouth 2 (two) times daily. 180 tablet 3  . aspirin EC 81 MG tablet Take 81 mg by mouth daily.    Marland Kitchen atenolol (TENORMIN) 25 MG tablet Take 0.5 tablets (12.5 mg total) by mouth 2 (two) times daily. 90 tablet 3  . fexofenadine (ALLEGRA) 180 MG tablet Take 1 tablet (180 mg total) by mouth daily. (Patient taking differently: Take 180 mg by mouth as needed. ) 30 tablet 11  . fluticasone (FLONASE) 50 MCG/ACT nasal spray Place 2 sprays into both nostrils daily. 16 g 2  . levothyroxine (SYNTHROID, LEVOTHROID) 75 MCG tablet Take 1 tablet (75 mcg total) by mouth daily before breakfast. 90 tablet 3  . omeprazole (PRILOSEC) 10 MG capsule Take 1 capsule (10 mg total) by mouth daily. 90 capsule 0  . simvastatin (ZOCOR) 10 MG tablet Take 1 tablet (10 mg total) by mouth every evening. 90 tablet 3  . Spacer/Aero-Holding Chambers (BREATHERITE COLL SPACER ADULT) MISC Use with  Albuterol Inhaler. 1 each 0  . VOLTAREN 1 % GEL   2  . Tiotropium Bromide Monohydrate (SPIRIVA RESPIMAT) 2.5 MCG/ACT AERS Inhale 2 puffs into the lungs daily. 1 Inhaler 0   No facility-administered medications prior to visit.          Objective:   Physical Exam  Vitals:   06/11/17 1421 06/11/17 1423  BP:  136/74  Pulse:  60  SpO2:  95%  Weight: 157 lb (71.2 kg)   Height: 5\' 1"  (1.549 m)    Gen: Pleasant, well-nourished, in no distress,  normal affect  ENT: No lesions,  mouth clear,  oropharynx clear, no postnasal drip  Neck: No JVD, no TMG, no carotid bruits  Lungs: No use of accessory muscles, clear without rales or rhonchi  Cardiovascular: brady but regular, heart sounds normal, no murmur or gallops, no peripheral edema  Musculoskeletal: No deformities, no cyanosis or clubbing  Neuro: alert, non focal  Skin: Warm, no lesions or rashes   CT scan 04/22/14 --  EXAM: CT CHEST WITHOUT CONTRAST  TECHNIQUE: Multidetector CT imaging of the chest was performed following the standard protocol without intravenous contrast. High resolution imaging of the lungs, as well as inspiratory and expiratory imaging, was performed.  COMPARISON: None.  FINDINGS: Right lobe of the thyroid is asymmetrically enlarged. Mediastinal lymph nodes are not enlarged by CT size criteria. Hilar regions are difficult to definitively evaluate without IV contrast. No axillary adenopathy. Ascending aorta measures 3.6 cm. Pulmonary arteries are enlarged. Three-vessel coronary artery calcification. Heart is at the upper limits of normal in size. No pericardial effusion.  Centrilobular emphysema appears moderate in severity. Mild peribronchovascular nodularity in the upper lobes, right greater than left, is likely postinfectious in etiology. Mild bronchiectasis is seen in association. No subpleural reticulation, traction bronchiectasis/ bronchiolectasis, ground-glass, architectural distortion or honeycombing. No air trapping. No pleural fluid. Airway is unremarkable.  Incidental imaging of the upper abdomen shows the visualized portions of the liver, adrenal glands, kidneys, spleen and stomach to be grossly unremarkable.  No worrisome lytic or sclerotic lesions.  IMPRESSION: 1. Mild  bronchiectasis is seen in association with probable post infectious scarring in the upper lobes,  right greater than left. 2. Moderate centrilobular emphysema. No evidence of superimposed interstitial lung disease. 3. Ascending aorta is at the upper limits of normal in size.      Assessment & Plan:  COPD (chronic obstructive pulmonary disease) (H[facility]) COPD, severity unclear.  Mild on pulmonary function testing from 2015.  She may have benefited some from Spiriva Respimat.  We have agreed to restart until her follow-up visit to see if she feels it is helping her.  We will perform pulmonary function testing to compare with 2015.  Walking oximetry today to ensure that she does not have occult desaturation given her continued exertional dyspnea.  She did have some cough that may be associated with a Stiolto.  If so then we will factor this in when we decide whether to continue her on scheduled bronchodilators.  Baltazar Apo, MD, PhD 06/11/2017, 2:47 PM [facility] [phone] or if no answer [phone]

## 2017-06-11 NOTE — Patient Instructions (Signed)
We will restart Spiriva Respimat 2 puffs once a day  We will perform pulmonary function testing  We will perform walking oximetry today.  Follow with Dr Lamonte Sakai in 2 months with PFT, or sooner if you have any problems.

## 2017-07-05 DIAGNOSIS — H52203 Unspecified astigmatism, bilateral: Secondary | ICD-10-CM | POA: Diagnosis not present

## 2017-07-05 DIAGNOSIS — H35371 Puckering of macula, right eye: Secondary | ICD-10-CM | POA: Diagnosis not present

## 2017-07-05 DIAGNOSIS — H25812 Combined forms of age-related cataract, left eye: Secondary | ICD-10-CM | POA: Diagnosis not present

## 2017-07-05 DIAGNOSIS — H43813 Vitreous degeneration, bilateral: Secondary | ICD-10-CM | POA: Diagnosis not present

## 2017-07-24 ENCOUNTER — Ambulatory Visit (INDEPENDENT_AMBULATORY_CARE_PROVIDER_SITE_OTHER): Payer: Medicare Other | Admitting: Emergency Medicine

## 2017-07-24 ENCOUNTER — Encounter: Payer: Self-pay | Admitting: Emergency Medicine

## 2017-07-24 VITALS — BP 130/82 | HR 66 | Ht 61.0 in | Wt 158.0 lb

## 2017-07-24 DIAGNOSIS — J479 Bronchiectasis, uncomplicated: Secondary | ICD-10-CM | POA: Diagnosis not present

## 2017-07-24 DIAGNOSIS — J449 Chronic obstructive pulmonary disease, unspecified: Secondary | ICD-10-CM

## 2017-07-24 DIAGNOSIS — J438 Other emphysema: Secondary | ICD-10-CM | POA: Diagnosis not present

## 2017-07-24 LAB — PULMONARY FUNCTION TEST
DL/VA % pred: 52 %
DL/VA: 2.3 ml/min/mmHg/L
DLCO unc % pred: 45 %
DLCO unc: 9.27 ml/min/mmHg
FEF 25-75 Post: 2.18 L/sec
FEF 25-75 Pre: 1.92 L/sec
FEF2575-%Change-Post: 13 %
FEF2575-%Pred-Post: 189 %
FEF2575-%Pred-Pre: 166 %
FEV1-%Change-Post: 3 %
FEV1-%Pred-Post: 129 %
FEV1-%Pred-Pre: 125 %
FEV1-Post: 2.07 L
FEV1-Pre: 2.01 L
FEV1FVC-%Change-Post: 0 %
FEV1FVC-%Pred-Pre: 108 %
FEV6-%Change-Post: 2 %
FEV6-%Pred-Post: 126 %
FEV6-%Pred-Pre: 123 %
FEV6-Post: 2.59 L
FEV6-Pre: 2.53 L
FEV6FVC-%Change-Post: 0 %
FEV6FVC-%Pred-Post: 106 %
FEV6FVC-%Pred-Pre: 106 %
FVC-%Change-Post: 2 %
FVC-%Pred-Post: 119 %
FVC-%Pred-Pre: 116 %
FVC-Post: 2.6 L
FVC-Pre: 2.53 L
Post FEV1/FVC ratio: 80 %
Post FEV6/FVC ratio: 100 %
Pre FEV1/FVC ratio: 79 %
Pre FEV6/FVC Ratio: 100 %
RV % pred: 100 %
RV: 2.29 L
TLC % pred: 108 %
TLC: 4.97 L

## 2017-07-24 NOTE — Assessment & Plan Note (Signed)
Most notable aspect of her pulmonary function testing today is a diffusion defect.  She did not desaturate on ambulation at her last visit but she is at risk to do so, may need oxygen in the future.  She has not noticed much improvement with a bronchodilator.  We will do a trial off the Spiriva to see if she loses any ground.  I think she would benefit from cardiopulmonary conditioning and we will send her to pulmonary rehab.  Continue her albuterol as needed.

## 2017-07-24 NOTE — Assessment & Plan Note (Signed)
RML bronchiectasis, mild by CT in 2016. No new mucous production or cough.   We will not repeat your CT scan of the chest at this time.  Depending on your cough, mucus production and overall status with you may decide to do so in the future to follow your bronchiectasis.

## 2017-07-24 NOTE — Patient Instructions (Addendum)
We talked today about starting cardiopulmonary rehab in [place].  Try stopping your Spiriva Respimat when your current inhaler runs out. Keep track of whether you miss this medication and if so call us so we can restart it  Take albuterol 2 puffs up to every 4 hours if needed for shortness of breath.  We will not repeat your CT scan of the chest at this time.  Depending on your cough, mucus production and overall status with you may decide to do so in the future to follow your bronchiectasis. Follow with Dr Lamonte Sakai in 6 months or sooner if you have any problems

## 2017-07-24 NOTE — Progress Notes (Signed)
PFT done today. 

## 2017-07-24 NOTE — Progress Notes (Signed)
Subjective:    [name], female    DOB: Dec 01, 1933, 84 y.o.   MRN: [id number]  COPD  She complains of shortness of breath. There is no cough or wheezing. Pertinent negatives include no chest pain, fever, postnasal drip, rhinorrhea or sneezing. Her past medical history is significant for COPD.   84 year old woman, hx of tobacco use (25 pk-yrs), history of hypertension, SVT and emphysema based on CT 04/2014. Her PFT and CT were personally reviewed by me. Showed some RML bronchiectasis as well as emphysema. PFT 2015 show normal airflows with some evidence of obstruction after BD administration.  She has been followed by Dr Gwenette Greet. She was being managed on Advair HFA + SABA prn. , and the Advair was stopped about a year ago. She still uses it prn. Doesn't use albuterol.   She has been dealing with a cough for the las several months, but it has cleared up in the last month, ? Allergies. Minimally productive of clear. Her BP med was changed in response to this. She is on allegra prn, fluticasone nasal spray prn.   ROV 06/11/17 --patient follows up today for COPD and bronchiectasis.  At her last visit I started Spiriva to see if she would benefit. She reports that it helps her some, but she still has exertional SOB. She has some am cough, minimally productive.   ROV 07/24/17 --84 year old former smoker with history of hypertension, SVT, right middle lobe bronchiectasis on prior imaging, suspected obstructive lung disease.  She also has some chronic cough with allergic rhinitis.  Ambulatory oximetry did not show any evidence of desaturation.  We repeated her pulmonary function testing today and I have reviewed.  This shows grossly normal airflows with a possible curve of her flow volume loop.  No significant bronchodilator response.  Normal lung volumes.  And a profoundly decreased diffusion capacity that does not correct when adjusted for alveolar volume. She restarted Spiriva Respimat > no  real change in her breathing. Her albuterol use may have gone down some. Her cough is a bit better, she has some chronic hoarseness.  She has more exertional SOB.    Review of Systems  Constitutional: Negative for activity change, fever and unexpected weight change.  HENT: Negative for congestion, postnasal drip, rhinorrhea and sneezing.   Respiratory: Positive for shortness of breath. Negative for cough, chest tightness, wheezing and stridor.   Cardiovascular: Negative for chest pain.     Past Medical History:  Diagnosis Date  . Arthritis    sed rate 10, RF, CCP pending  . Blood in stool   . Cervical cancer ([place])    s/p hysterectomy/oop  . DDD (degenerative disc disease), lumbosacral   . Depression    Due to husbands passing away 09/22/2011  . Diverticulosis of colon   . Environmental allergies   . GERD (gastroesophageal reflux disease)   . H pylori ulcer    not treated due to expense  10/2001  . Hand dermatitis   . HTN (hypertension)   . Hyperlipidemia   . Hypothyroidism    TSH 14.488 (11/2005)  . IBS (irritable bowel syndrome)   . Lactose intolerance   . Migraines    "get them very rarely now" (02/08/2012)  . Multiple pigmented nevi    last derm evaluation 01/2003  . OA (osteoarthritis)    multiple sites  . Osteoporosis    "borderline" (02/08/2012)  . Other seborrheic keratosis   . Pain in joint, lower leg   .  Pneumonia    "couple times in my lifetime" (02/08/2012)  . PONV (postoperative nausea and vomiting)    "and takes me a long time to come out under it" (02/08/2012)  . Posterior vitreous detachment 1996  . Postmenopausal    s/p hysterectomy for h/o cervical cancer 1982 (both ovaries taken at that time) now on hormonal replacement   . Postmenopausal HRT (hormone replacement therapy)   . PPD positive    history +PPD 1984, no treatment, no abnormal CXR  . Shortness of breath    with ambulation  . Stroke [facility])    TIA- April 2016 last one  . Supraventricular  tachycardia, paroxysmal ([place])   . Urinary frequency   . Vaginal pruritus      Family History  Problem Relation Age of Onset  . Breast cancer Mother 55  . Lung cancer Brother 30  . Cancer Brother        lung  . Heart failure Father        congestive  . Heart disease Father   . Epilepsy Son   . Kidney disease Son        tuberous sclerosis, both kidneys removed, has transplant  . Diabetes Maternal Uncle   . Diabetes Paternal Aunt   . Cancer Maternal Grandmother        breast  . Breast cancer Maternal Grandmother   . Heart disease Son        [place]  . COPD Son   . Rectal cancer Maternal Aunt   . Colon cancer Neg Hx   . Esophageal cancer Neg Hx   . Stomach cancer Neg Hx      Social History   Socioeconomic History  . Marital status: Widowed    Spouse name: Not on file  . Number of children: 3  . Years of education: 12TH  . Highest education level: Not on file  Occupational History  . Occupation: retired    Comment: Academic librarian: RETIRED  Social Needs  . Financial resource strain: Not on file  . Food insecurity:    Worry: Not on file    Inability: Not on file  . Transportation needs:    Medical: Not on file    Non-medical: Not on file  Tobacco Use  . Smoking status: Former Smoker    Packs/day: 1.00    Years: 25.00    Pack years: 25.00    Types: Cigarettes    Last attempt to quit: 04/17/1980    Years since quitting: 37.2  . Smokeless tobacco: Former Systems developer    Quit date: 03/16/1984  . Tobacco comment: passive smoker, husband smoked  Substance and Sexual Activity  . Alcohol use: No  . Drug use: No  . Sexual activity: Never  Lifestyle  . Physical activity:    Days per week: Not on file    Minutes per session: Not on file  . Stress: Not on file  Relationships  . Social connections:    Talks on phone: Not on file    Gets together: Not on file    Attends religious service: Not on file    Active member of club or organization: Not on file     Attends meetings of clubs or organizations: Not on file    Relationship status: Not on file  . Intimate partner violence:    Fear of current or ex partner: Not on file    Emotionally abused: Not on file    Physically abused:  Not on file    Forced sexual activity: Not on file  Other Topics Concern  . Not on file  Social History Narrative   Lives with son, [name] who has medical problems-they help each other. Her husband died Oct 07, 2022 after 11 years of marriage.  She has 2 other sons that live within 2 hrs from her. She does not drive-she uses [facility]. She used to work in Scientist, research (medical), retired.     Tobacco: 25 pack yr hx, quit 1985.     Allergies  Allergen Reactions  . Benzonatate Nausea And Vomiting    "tessalon pearl"  . Sulfonamide Derivatives Itching and Rash  . Hctz [Hydrochlorothiazide] Other (See Comments)    Severe hyponatremia      Outpatient Medications Prior to Visit  Medication Sig Dispense Refill  . albuterol (PROAIR HFA) 108 (90 Base) MCG/ACT inhaler Inhale 2 puffs into the lungs every 4 (four) hours as needed for wheezing or shortness of breath. 1 Inhaler 3  . amLODipine (NORVASC) 2.5 MG tablet Take 1 tablet (2.5 mg total) by mouth 2 (two) times daily. 180 tablet 3  . aspirin EC 81 MG tablet Take 81 mg by mouth daily.    Marland Kitchen atenolol (TENORMIN) 25 MG tablet Take 0.5 tablets (12.5 mg total) by mouth 2 (two) times daily. 90 tablet 3  . fexofenadine (ALLEGRA) 180 MG tablet Take 1 tablet (180 mg total) by mouth daily. (Patient taking differently: Take 180 mg by mouth as needed. ) 30 tablet 11  . levothyroxine (SYNTHROID, LEVOTHROID) 75 MCG tablet Take 1 tablet (75 mcg total) by mouth daily before breakfast. 90 tablet 3  . omeprazole (PRILOSEC) 10 MG capsule Take 1 capsule (10 mg total) by mouth daily. 90 capsule 0  . simvastatin (ZOCOR) 10 MG tablet Take 1 tablet (10 mg total) by mouth every evening. 90 tablet 3  . Spacer/Aero-Holding Chambers (BREATHERITE COLL SPACER ADULT)  MISC Use with  Albuterol Inhaler. 1 each 0  . Tiotropium Bromide Monohydrate (SPIRIVA RESPIMAT) 2.5 MCG/ACT AERS Inhale 2 puffs into the lungs daily. 1 Inhaler 5  . VOLTAREN 1 % GEL   2  . fluticasone (FLONASE) 50 MCG/ACT nasal spray Place 2 sprays into both nostrils daily. 16 g 2   No facility-administered medications prior to visit.          Objective:   Physical Exam  Vitals:   07/24/17 1357 07/24/17 1358  BP:  130/82  Pulse:  66  SpO2:  92%  Weight: 158 lb (71.7 kg)   Height: 5\' 1"  (1.549 m)   Gen: Pleasant, well-nourished, in no distress,  normal affect  ENT: No lesions,  mouth clear,  oropharynx clear, no postnasal drip, voice a bit hoarse  Neck: No JVD, no stridor  Lungs: No use of accessory muscles, clear without rales or rhonchi  Cardiovascular: brady but regular, heart sounds normal, no murmur or gallops, no peripheral edema  Musculoskeletal: No deformities, no cyanosis or clubbing  Neuro: alert, non focal  Skin: Warm, no lesions or rashes   CT scan 04/22/14 --  EXAM: CT CHEST WITHOUT CONTRAST  TECHNIQUE: Multidetector CT imaging of the chest was performed following the standard protocol without intravenous contrast. High resolution imaging of the lungs, as well as inspiratory and expiratory imaging, was performed.  COMPARISON: None.  FINDINGS: Right lobe of the thyroid is asymmetrically enlarged. Mediastinal lymph nodes are not enlarged by CT size criteria. Hilar regions are difficult to definitively evaluate without IV contrast. No axillary  adenopathy. Ascending aorta measures 3.6 cm. Pulmonary arteries are enlarged. Three-vessel coronary artery calcification. Heart is at the upper limits of normal in size. No pericardial effusion.  Centrilobular emphysema appears moderate in severity. Mild peribronchovascular nodularity in the upper lobes, right greater than left, is likely postinfectious in etiology. Mild bronchiectasis is seen in  association. No subpleural reticulation, traction bronchiectasis/ bronchiolectasis, ground-glass, architectural distortion or honeycombing. No air trapping. No pleural fluid. Airway is unremarkable.  Incidental imaging of the upper abdomen shows the visualized portions of the liver, adrenal glands, kidneys, spleen and stomach to be grossly unremarkable.  No worrisome lytic or sclerotic lesions.  IMPRESSION: 1. Mild bronchiectasis is seen in association with probable post infectious scarring in the upper lobes, right greater than left. 2. Moderate centrilobular emphysema. No evidence of superimposed interstitial lung disease. 3. Ascending aorta is at the upper limits of normal in size.      Assessment & Plan:  COPD (chronic obstructive pulmonary disease) ([place]) Most notable aspect of her pulmonary function testing today is a diffusion defect.  She did not desaturate on ambulation at her last visit but she is at risk to do so, may need oxygen in the future.  She has not noticed much improvement with a bronchodilator.  We will do a trial off the Spiriva to see if she loses any ground.  I think she would benefit from cardiopulmonary conditioning and we will send her to pulmonary rehab.  Continue her albuterol as needed.  Bronchiectasis without complication ([place]) RML bronchiectasis, mild by CT in 2016. No new mucous production or cough.   We will not repeat your CT scan of the chest at this time.  Depending on your cough, mucus production and overall status with you may decide to do so in the future to follow your bronchiectasis.  Baltazar Apo, MD, PhD 07/24/2017, 2:26 PM [facility] [phone] or if no answer [phone]

## 2017-07-26 ENCOUNTER — Encounter: Payer: Self-pay | Admitting: Family Medicine

## 2017-07-27 ENCOUNTER — Telehealth: Payer: Self-pay | Admitting: Family Medicine

## 2017-07-27 NOTE — Telephone Encounter (Signed)
Sent this information to patient in MY Chart.

## 2017-07-27 NOTE — Telephone Encounter (Signed)
Problem focused labs are not completed during medicare wellness.  Amy Rodriguez has many chronic medical conditions in which she needs to follow routinely with the provider (refer to last note for timeline). Her labs will be collected onher next schedule visit with me... She should come fasting during that visit.

## 2017-07-31 ENCOUNTER — Telehealth (HOSPITAL_COMMUNITY): Payer: Self-pay

## 2017-07-31 NOTE — Telephone Encounter (Signed)
Called patient to see if she is interested in the Pulmonary Rehab Program - Patient stated she is interested. Patient would like to do the 1:30pm exc class. I explained to patient that the 1:30 class is full and once a spot becomes available we will call to schedule orientation. Patient verbally stated she understands.

## 2017-07-31 NOTE — Telephone Encounter (Signed)
Patients insurance is active and benefits verified through [facility] - No co-pay, no deductible, out of pocket amount of $2,700/$0.00 has been met, no co-insurance, and no pre-authorization is required. Reference [id number]

## 2017-08-13 NOTE — Progress Notes (Addendum)
Subjective:   Amy Rodriguez is a 84 y.o. female who presents for Medicare Annual (Subsequent) preventive examination.  Review of Systems:  No ROS.  Medicare Wellness Visit. Additional risk factors are reflected in the social history.  Cardiac Risk Factors include: advanced age (>21men, >13 women);dyslipidemia;hypertension;sedentary lifestyle;family history of premature cardiovascular disease   Sleep patterns: Sleeps 6-7 hours, naps during the day.  Home Safety/Smoke Alarms: Feels safe in home. Smoke alarms in place.  Living environment; residence and Firearm Safety: Lives with son in 1 story home,  [place] Safety/Bike Helmet: Wears seat belt.   Female:   [id number]       Mammo-11/27/2016, BI-RADS CATEGORY  1: Negative       Dexa scan-08/31/2015, Osteopenia. Ordered today. GI .            CCS-Colonoscopy 10/07/2015, polyp.       Objective:     Vitals: BP 138/70 (BP Location: Left Arm, Patient Position: Sitting, Cuff Size: Normal)   Pulse 71   Temp 97.6 F (36.4 C) (Oral)   Resp 18   Ht 5\' 1"  (1.549 m)   Wt 158 lb 6 oz (71.8 kg)   SpO2 97%   BMI 29.92 kg/m   Body mass index is 29.92 kg/m.  Advanced Directives 08/14/2017 08/07/2016 10/07/2015 09/21/2015 08/03/2015 08/03/2015 03/01/2014  Does Patient Have a Medical Advance Directive? Yes Yes No No - Yes No  Type of Paramedic of [place];Living will Living will - - - [place];Living will -  Copy of [place] in Chart? No - copy requested - - - No - copy requested Yes -  Would patient like information on creating a medical advance directive? - - - No - patient declined information - - Yes Higher education careers adviser given    Tobacco Social History   Tobacco Use  Smoking Status Former Smoker  . Packs/day: 1.00  . Years: 25.00  . Pack years: 25.00  . Types: Cigarettes  . Last attempt to quit: 04/17/1980  . Years since quitting: 37.3  Smokeless Tobacco Former Systems developer   . Quit date: 03/16/1984  Tobacco Comment   passive smoker, husband smoked     Counseling given: Not Answered Comment: passive smoker, husband smoked    Past Medical History:  Diagnosis Date  . Arthritis    sed rate 10, RF, CCP pending  . Blood in stool   . Cervical cancer ([place])    s/p hysterectomy/oop  . DDD (degenerative disc disease), lumbosacral   . Depression    Due to husbands passing away 09/22/2011  . Diverticulosis of colon   . Environmental allergies   . GERD (gastroesophageal reflux disease)   . H pylori ulcer    not treated due to expense  10/2001  . Hand dermatitis   . HTN (hypertension)   . Hyperlipidemia   . Hypothyroidism    TSH 14.488 (11/2005)  . IBS (irritable bowel syndrome)   . Lactose intolerance   . Migraines    "get them very rarely now" (02/08/2012)  . Multiple pigmented nevi    last derm evaluation 01/2003  . OA (osteoarthritis)    multiple sites  . Osteoporosis    "borderline" (02/08/2012)  . Other seborrheic keratosis   . Pain in joint, lower leg   . Pneumonia    "couple times in my lifetime" (02/08/2012)  . PONV (postoperative nausea and vomiting)    "and takes me a long time to  come out under it" (02/08/2012)  . Posterior vitreous detachment 1996  . Postmenopausal    s/p hysterectomy for h/o cervical cancer 1982 (both ovaries taken at that time) now on hormonal replacement   . Postmenopausal HRT (hormone replacement therapy)   . PPD positive    history +PPD 1984, no treatment, no abnormal CXR  . Shortness of breath    with ambulation  . Stroke [facility])    TIA- April 2016 last one  . Supraventricular tachycardia, paroxysmal ([place])   . Urinary frequency   . Vaginal pruritus    Past Surgical History:  Procedure Laterality Date  . ABDOMINAL HYSTERECTOMY  1982  . CARDIOVASCULAR STRESS TEST  12/07/11   Normal nuclear stress test  . CATARACT EXTRACTION W/ INTRAOCULAR LENS IMPLANT  ?1997   right  . COLONOSCOPY    . EYE SURGERY      cataract removal right eye  . TOTAL ABDOMINAL HYSTERECTOMY W/ BILATERAL SALPINGOOPHORECTOMY  1982  . TOTAL KNEE ARTHROPLASTY  02/08/2012   Procedure: TOTAL KNEE ARTHROPLASTY;  Surgeon: Hessie Dibble, MD;  Location: [facility];  Service: Orthopedics;  Laterality: Left;   Family History  Problem Relation Age of Onset  . Breast cancer Mother 4  . Lung cancer Brother 35  . Cancer Brother        lung  . Heart failure Father        congestive  . Heart disease Father   . Epilepsy Son   . Kidney disease Son        tuberous sclerosis, both kidneys removed, has transplant  . Diabetes Maternal Uncle   . Diabetes Paternal Aunt   . Cancer Maternal Grandmother        breast  . Breast cancer Maternal Grandmother   . Heart disease Son        [place] white  . COPD Son   . Rectal cancer Maternal Aunt   . Colon cancer Neg Hx   . Esophageal cancer Neg Hx   . Stomach cancer Neg Hx    Social History   Socioeconomic History  . Marital status: Widowed    Spouse name: Not on file  . Number of children: 3  . Years of education: 12TH  . Highest education level: Not on file  Occupational History  . Occupation: retired    Comment: Academic librarian: RETIRED  Social Needs  . Financial resource strain: Not on file  . Food insecurity:    Worry: Not on file    Inability: Not on file  . Transportation needs:    Medical: Not on file    Non-medical: Not on file  Tobacco Use  . Smoking status: Former Smoker    Packs/day: 1.00    Years: 25.00    Pack years: 25.00    Types: Cigarettes    Last attempt to quit: 04/17/1980    Years since quitting: 37.3  . Smokeless tobacco: Former Systems developer    Quit date: 03/16/1984  . Tobacco comment: passive smoker, husband smoked  Substance and Sexual Activity  . Alcohol use: No  . Drug use: No  . Sexual activity: Never  Lifestyle  . Physical activity:    Days per week: Not on file    Minutes per session: Not on file  . Stress: Not on file  Relationships  .  Social connections:    Talks on phone: Not on file    Gets together: Not on file    Attends religious  service: Not on file    Active member of club or organization: Not on file    Attends meetings of clubs or organizations: Not on file    Relationship status: Not on file  Other Topics Concern  . Not on file  Social History Narrative   Lives with son, [name] who has medical problems-they help each other. Her husband died 10/20/22 after 56 years of marriage.  She has 2 other sons that live within 2 hrs from her. She does not drive-she uses [facility]. She used to work in Scientist, research (medical), retired.     Tobacco: 25 pack yr hx, quit 1985.    Outpatient Encounter Medications as of 08/14/2017  Medication Sig  . albuterol (PROAIR HFA) 108 (90 Base) MCG/ACT inhaler Inhale 2 puffs into the lungs every 4 (four) hours as needed for wheezing or shortness of breath.  Marland Kitchen amLODipine (NORVASC) 2.5 MG tablet Take 1 tablet (2.5 mg total) by mouth 2 (two) times daily.  Marland Kitchen aspirin EC 81 MG tablet Take 81 mg by mouth daily.  Marland Kitchen atenolol (TENORMIN) 25 MG tablet Take 0.5 tablets (12.5 mg total) by mouth 2 (two) times daily.  . fexofenadine (ALLEGRA) 180 MG tablet Take 1 tablet (180 mg total) by mouth daily. (Patient taking differently: Take 180 mg by mouth as needed. )  . levothyroxine (SYNTHROID, LEVOTHROID) 75 MCG tablet Take 1 tablet (75 mcg total) by mouth daily before breakfast.  . omeprazole (PRILOSEC) 10 MG capsule Take 1 capsule (10 mg total) by mouth daily.  . simvastatin (ZOCOR) 10 MG tablet Take 1 tablet (10 mg total) by mouth every evening.  Marland Kitchen Spacer/Aero-Holding Chambers (BREATHERITE COLL SPACER ADULT) MISC Use with  Albuterol Inhaler.  . Tiotropium Bromide Monohydrate (SPIRIVA RESPIMAT) 2.5 MCG/ACT AERS Inhale 2 puffs into the lungs daily.  . VOLTAREN 1 % GEL    No facility-administered encounter medications on file as of 08/14/2017.     Activities of Daily Living In your present state of health, do you  have any difficulty performing the following activities: 08/14/2017  Hearing? N  Vision? N  Difficulty concentrating or making decisions? N  Walking or climbing stairs? Y  Comment Recent fall limited amb/steps for now  Dressing or bathing? N  Doing errands, shopping? N  Preparing Food and eating ? N  Using the Toilet? N  In the past six months, have you accidently leaked urine? N  Do you have problems with loss of bowel control? N  Managing your Medications? N  Managing your Finances? N  Housekeeping or managing your Housekeeping? N  Some recent data might be hidden    Patient Care Team: Ma Hillock, DO as PCP - General (Family Medicine) Shon Hough, MD as Consulting Physician (Ophthalmology) Lamonte Sakai Rose Fillers, MD as Consulting Physician (Pulmonary Disease) Stanford Breed Denice Bors, MD as Consulting Physician (Cardiology) Garvin Fila, MD as Consulting Physician (Neurology) Inda Castle, MD (Inactive) as Consulting Physician (Gastroenterology) Philemon Kingdom, MD as Consulting Physician (Internal Medicine) Melrose Nakayama, MD as Consulting Physician (Orthopedic Surgery)    Assessment:   This is a routine wellness examination for [place].  Exercise Activities and Dietary recommendations Current Exercise Habits: The patient does not participate in regular exercise at present(Maintain house chores), Exercise limited by: None identified   Diet (meal preparation, eat out, water intake, caffeinated beverages, dairy products, fruits and vegetables): Drinks water, coffee and gatorade.   Breakfast: cereal; coffee Lunch: sandwich, soup Dinner: protein and vegetables  Goals    . Increase physical activity     Increase activity.        Fall Risk Fall Risk  08/14/2017 08/07/2016 08/03/2015 07/08/2014  Falls in the past year? Yes No No No  Number falls in past yr: 2 or more - - -  Injury with Fall? No - - -  Risk Factor Category  High Fall Risk - - -  Risk for fall due  to : History of fall(s) - - -  Follow up Falls prevention discussed - - -    Depression Screen PHQ 2/9 Scores 08/14/2017 08/07/2016 08/03/2015 07/08/2014  PHQ - 2 Score 0 0 0 0     Cognitive Function MMSE - Mini Mental State Exam 08/14/2017 08/07/2016  Orientation to time 5 5  Orientation to Place 5 5  Registration 3 3  Attention/ Calculation 5 5  Recall 3 3  Language- name 2 objects 2 2  Language- repeat 1 1  Language- follow 3 step command 3 3  Language- read & follow direction 1 1  Write a sentence 1 1  Copy design 0 1  Total score 29 30        Immunization History  Administered Date(s) Administered  . Influenza Split 01/12/2011, 02/09/2012  . Influenza Whole 02/08/2007, 01/24/2008, 12/16/2008, 01/27/2009, 02/17/2010  . Influenza, High Dose Seasonal PF 01/16/2017  . Influenza,inj,Quad PF,6+ Mos 12/17/2012, 12/19/2013, 01/08/2015, 01/06/2016  . Pneumococcal Conjugate-13 07/08/2014  . Pneumococcal Polysaccharide-23 05/23/2004  . Td 06/16/2002  . Tdap 12/17/2012  . Zoster 12/17/2012   Declines Shingrix.   Screening Tests Health Maintenance  Topic Date Due  . DEXA SCAN  08/30/2017  . INFLUENZA VACCINE  11/15/2017  . MAMMOGRAM  11/27/2017  . TETANUS/TDAP  12/18/2022  . PNA vac Low Risk Adult  Completed       Plan:    Schedule bone scan after 11/28/2017 (with mammo).   Bring a copy of your living will and/or healthcare power of attorney to your next office visit.  Continue doing brain stimulating activities (puzzles, reading, adult coloring books, staying active) to keep memory sharp.   I have personally reviewed and noted the following in the patient's chart:   . Medical and social history . Use of alcohol, tobacco or illicit drugs  . Current medications and supplements . Functional ability and status . Nutritional status . Physical activity . Advanced directives . List of other physicians . Hospitalizations, surgeries, and ER visits in previous 12  months . Vitals . Screenings to include cognitive, depression, and falls . Referrals and appointments  In addition, I have reviewed and discussed with patient certain preventive protocols, quality metrics, and best practice recommendations. A written personalized care plan for preventive services as well as general preventive health recommendations were provided to patient.     Gerilyn Nestle, RN  08/14/2017   PCP Notes: -Pt had recent fall (1 week ago), tripped while walking. Fell on left knee (h/o knee replacement 2016). Pt has treated with Ibuprofen and Ice.  Left leg severely bruised down to ankle. Pedal pulses present, leg warm to touch.  Scheduled for 30 min office visit with PCP today at 3pm.   Medical screening examination/treatment/procedure(s) were performed by non-physician practitioner and as supervising physician I was immediately available for consultation/collaboration.  I agree with above assessment and plan.  Electronically Signed by: Howard Pouch, DO [place] [facility] [place]

## 2017-08-14 ENCOUNTER — Ambulatory Visit (INDEPENDENT_AMBULATORY_CARE_PROVIDER_SITE_OTHER): Payer: Medicare Other

## 2017-08-14 ENCOUNTER — Other Ambulatory Visit: Payer: Self-pay

## 2017-08-14 ENCOUNTER — Encounter: Payer: Self-pay | Admitting: Family Medicine

## 2017-08-14 ENCOUNTER — Ambulatory Visit (INDEPENDENT_AMBULATORY_CARE_PROVIDER_SITE_OTHER): Payer: Medicare Other | Admitting: Family Medicine

## 2017-08-14 VITALS — BP 138/70 | HR 71 | Temp 97.6°F | Resp 18 | Ht 61.0 in | Wt 158.4 lb

## 2017-08-14 VITALS — BP 138/70 | HR 71 | Temp 97.6°F | Resp 18 | Ht 61.0 in | Wt 158.6 lb

## 2017-08-14 DIAGNOSIS — S8992XA Unspecified injury of left lower leg, initial encounter: Secondary | ICD-10-CM | POA: Diagnosis not present

## 2017-08-14 DIAGNOSIS — Z96652 Presence of left artificial knee joint: Secondary | ICD-10-CM

## 2017-08-14 DIAGNOSIS — E2839 Other primary ovarian failure: Secondary | ICD-10-CM | POA: Diagnosis not present

## 2017-08-14 DIAGNOSIS — Z0000 Encounter for general adult medical examination without abnormal findings: Secondary | ICD-10-CM | POA: Diagnosis not present

## 2017-08-14 MED ORDER — PREDNISONE 20 MG PO TABS: ORAL_TABLET | ORAL | 0 refills | Status: DC

## 2017-08-14 MED ORDER — TRAMADOL HCL 50 MG PO TABS: 50.0000 mg | ORAL_TABLET | Freq: Three times a day (TID) | ORAL | 0 refills | Status: DC | PRN

## 2017-08-14 NOTE — Patient Instructions (Signed)
Start tramadol for pain as needed.  Start prednisone taper with food daily.  I have placed a referral back to your orthopedic. If not able to work you in ASAP, then I want to get the xray now    Ice, elevate, rest the knee.

## 2017-08-14 NOTE — Progress Notes (Signed)
Amy Rodriguez , [birth year], 84 y.o., female MRN: [id number] Patient Care Team    Relationship Specialty Notifications Start End  Ma Hillock, DO PCP - General Family Medicine  01/08/15   Shon Hough, MD Consulting Physician Ophthalmology  08/03/15   Collene Gobble, MD Consulting Physician Pulmonary Disease  08/03/15   Lelon Perla, MD Consulting Physician Cardiology  08/03/15   Garvin Fila, MD Consulting Physician Neurology  08/03/15   Inda Castle, MD (Inactive) Consulting Physician Gastroenterology  08/04/15   Philemon Kingdom, MD Consulting Physician Internal Medicine  08/07/16   Melrose Nakayama, MD Consulting Physician Orthopedic Surgery  08/07/16     Chief Complaint  Patient presents with  . Leg Pain    Pt states fell I wk ago and her left leg is swollen. try ibuprofen/ice for relief     Subjective: Pt presents for an OV with complaints of left knee pain after fall approximately 10 days ago.  She reports she tripped over the wheel of her son's walker and fell forward onto bilateral knees.  Her right knee has healed completely.  Her left knee has become swollen, severely bruised and painful.  She has a history of a total knee replacement in 2013 by Dr. Rhona Raider.  She reports moderate to severe pain in the anterior portion of her knee and above her kneecap.  Walking and transitioning from sitting to standing or vice versa increases her pain. Pt has tried Advil 200 mg 3 times daily as needed to ease their symptoms.  She denies fever, chills, nausea or vomit.  Depression screen [facility] 2/9 08/14/2017 08/07/2016 08/03/2015 07/08/2014 12/05/2011  Decreased Interest 0 0 0 0 1  Down, Depressed, Hopeless 0 0 0 0 0  PHQ - 2 Score 0 0 0 0 1  Some recent data might be hidden    Allergies  Allergen Reactions  . Benzonatate Nausea And Vomiting    "tessalon pearl"  . Sulfonamide Derivatives Itching and Rash  . Hctz [Hydrochlorothiazide] Other (See Comments)    Severe  hyponatremia    Social History   Tobacco Use  . Smoking status: Former Smoker    Packs/day: 1.00    Years: 25.00    Pack years: 25.00    Types: Cigarettes    Last attempt to quit: 04/17/1980    Years since quitting: 37.3  . Smokeless tobacco: Former Systems developer    Quit date: 03/16/1984  . Tobacco comment: passive smoker, husband smoked  Substance Use Topics  . Alcohol use: No   Past Medical History:  Diagnosis Date  . Arthritis    sed rate 10, RF, CCP pending  . Blood in stool   . Cervical cancer ([place])    s/p hysterectomy/oop  . DDD (degenerative disc disease), lumbosacral   . Depression    Due to husbands passing away 09/22/2011  . Diverticulosis of colon   . Environmental allergies   . GERD (gastroesophageal reflux disease)   . H pylori ulcer    not treated due to expense  10/2001  . Hand dermatitis   . HTN (hypertension)   . Hyperlipidemia   . Hypothyroidism    TSH 14.488 (11/2005)  . IBS (irritable bowel syndrome)   . Lactose intolerance   . Migraines    "get them very rarely now" (02/08/2012)  . Multiple pigmented nevi    last derm evaluation 01/2003  . OA (osteoarthritis)    multiple sites  . Osteoporosis    "borderline" (  02/08/2012)  . Other seborrheic keratosis   . Pain in joint, lower leg   . Pneumonia    "couple times in my lifetime" (02/08/2012)  . PONV (postoperative nausea and vomiting)    "and takes me a long time to come out under it" (02/08/2012)  . Posterior vitreous detachment 1996  . Postmenopausal    s/p hysterectomy for h/o cervical cancer 1982 (both ovaries taken at that time) now on hormonal replacement   . Postmenopausal HRT (hormone replacement therapy)   . PPD positive    history +PPD 1984, no treatment, no abnormal CXR  . Shortness of breath    with ambulation  . Stroke [facility])    TIA- April 2016 last one  . Supraventricular tachycardia, paroxysmal ([place])   . Urinary frequency   . Vaginal pruritus    Past Surgical History:  Procedure  Laterality Date  . ABDOMINAL HYSTERECTOMY  1982  . CARDIOVASCULAR STRESS TEST  12/07/11   Normal nuclear stress test  . CATARACT EXTRACTION W/ INTRAOCULAR LENS IMPLANT  ?1997   right  . COLONOSCOPY    . EYE SURGERY     cataract removal right eye  . TOTAL ABDOMINAL HYSTERECTOMY W/ BILATERAL SALPINGOOPHORECTOMY  1982  . TOTAL KNEE ARTHROPLASTY  02/08/2012   Procedure: TOTAL KNEE ARTHROPLASTY;  Surgeon: Hessie Dibble, MD;  Location: [facility];  Service: Orthopedics;  Laterality: Left;   Family History  Problem Relation Age of Onset  . Breast cancer Mother 5  . Lung cancer Brother 49  . Cancer Brother        lung  . Heart failure Father        congestive  . Heart disease Father   . Epilepsy Son   . Kidney disease Son        tuberous sclerosis, both kidneys removed, has transplant  . Diabetes Maternal Uncle   . Diabetes Paternal Aunt   . Cancer Maternal Grandmother        breast  . Breast cancer Maternal Grandmother   . Heart disease Son        [place] white  . COPD Son   . Rectal cancer Maternal Aunt   . Colon cancer Neg Hx   . Esophageal cancer Neg Hx   . Stomach cancer Neg Hx    Allergies as of 08/14/2017      Reactions   Benzonatate Nausea And Vomiting   "tessalon pearl"   Sulfonamide Derivatives Itching, Rash   Hctz [hydrochlorothiazide] Other (See Comments)   Severe hyponatremia       Medication List        Accurate as of 08/14/17  3:14 PM. Always use your most recent med list.          albuterol 108 (90 Base) MCG/ACT inhaler Commonly known as:  PROAIR HFA Inhale 2 puffs into the lungs every 4 (four) hours as needed for wheezing or shortness of breath.   amLODipine 2.5 MG tablet Commonly known as:  NORVASC Take 1 tablet (2.5 mg total) by mouth 2 (two) times daily.   aspirin EC 81 MG tablet Take 81 mg by mouth daily.   atenolol 25 MG tablet Commonly known as:  TENORMIN Take 0.5 tablets (12.5 mg total) by mouth 2 (two) times daily.   BREATHERITE  COLL SPACER ADULT Misc Use with  Albuterol Inhaler.   fexofenadine 180 MG tablet Commonly known as:  ALLEGRA Take 1 tablet (180 mg total) by mouth daily.   levothyroxine 75 MCG tablet  Commonly known as:  SYNTHROID, LEVOTHROID Take 1 tablet (75 mcg total) by mouth daily before breakfast.   omeprazole 10 MG capsule Commonly known as:  PRILOSEC Take 1 capsule (10 mg total) by mouth daily.   simvastatin 10 MG tablet Commonly known as:  ZOCOR Take 1 tablet (10 mg total) by mouth every evening.   Tiotropium Bromide Monohydrate 2.5 MCG/ACT Aers Commonly known as:  SPIRIVA RESPIMAT Inhale 2 puffs into the lungs daily.   VOLTAREN 1 % Gel Generic drug:  diclofenac sodium       All past medical history, surgical history, allergies, family history, immunizations andmedications were updated in the EMR today and reviewed under the history and medication portions of their EMR.     ROS: Negative, with the exception of above mentioned in HPI   Objective:  BP 138/70 (BP Location: Left Arm, Patient Position: Sitting, Cuff Size: Normal)   Pulse 71   Temp 97.6 F (36.4 C) (Oral)   Resp 18   Ht 5\' 1"  (1.549 m)   Wt 158 lb 9.6 oz (71.9 kg)   SpO2 97%   BMI 29.97 kg/m  Body mass index is 29.97 kg/m. Gen: Afebrile. No acute distress. Nontoxic in appearance, well developed, well nourished.  HENT: AT. [place].  MMM MSK: No erythema, moderate soft tissue swelling, severely bruised anterior medial knee.  Her effusion present.  Tender to palpation anterior knee and medial.  Negative Homans.  Neurovascularly intact distally. Neuro: Walking with limp. PERLA. EOMi. Alert. Oriented x3    No exam data present No results found. No results found for this or any previous visit (from the past 24 hour(s)).  Assessment/Plan: BRANAE CRAIL is a 84 y.o. female present for OV for  Left knee injury, initial encounter/Status post total left knee replacement Fall was 10 days ago, patient is severely  bruised and swollen.  Still in moderate pain.  Have referred her back to her orthopedic specialist urgently.  Concerns over her hardware stability.  Discussed having x-ray completed, however it would not change management,  as she still needs to be seen urgently by her orthopedic specialist and they will complete x-ray at that time.  -Prednisone taper prescribed, tramadol 50 mg every 8 hours as needed as needed for moderate to severe pain. - Ambulatory referral to Orthopedic Surgery   *Patient had her Medicare wellness visit today with health coach.   Reviewed expectations re: course of current medical issues.  Discussed self-management of symptoms.  Outlined signs and symptoms indicating need for more acute intervention.  Patient verbalized understanding and all questions were answered.  Patient received an After-Visit Summary.    No orders of the defined types were placed in this encounter.    Note is dictated utilizing voice recognition software. Although note has been proof read prior to signing, occasional typographical errors still can be missed. If any questions arise, please do not hesitate to call for verification.   electronically signed by:  Howard Pouch, DO  [place]

## 2017-08-14 NOTE — Patient Instructions (Addendum)
Schedule bone scan after 11/28/2017 (with mammo).   Bring a copy of your living will and/or healthcare power of attorney to your next office visit.  Continue doing brain stimulating activities (puzzles, reading, adult coloring books, staying active) to keep memory sharp.   Health Maintenance, Female Adopting a healthy lifestyle and getting preventive care can go a long way to promote health and wellness. Talk with your health care provider about what schedule of regular examinations is right for you. This is a good chance for you to check in with your provider about disease prevention and staying healthy. In between checkups, there are plenty of things you can do on your own. Experts have done a lot of research about which lifestyle changes and preventive measures are most likely to keep you healthy. Ask your health care provider for more information. Weight and diet Eat a healthy diet  Be sure to include plenty of vegetables, fruits, low-fat dairy products, and lean protein.  Do not eat a lot of foods high in solid fats, added sugars, or salt.  Get regular exercise. This is one of the most important things you can do for your health. ? Most adults should exercise for at least 150 minutes each week. The exercise should increase your heart rate and make you sweat (moderate-intensity exercise). ? Most adults should also do strengthening exercises at least twice a week. This is in addition to the moderate-intensity exercise.  Maintain a healthy weight  Body mass index (BMI) is a measurement that can be used to identify possible weight problems. It estimates body fat based on height and weight. Your health care provider can help determine your BMI and help you achieve or maintain a healthy weight.  For females 79 years of age and older: ? A BMI below 18.5 is considered underweight. ? A BMI of 18.5 to 24.9 is normal. ? A BMI of 25 to 29.9 is considered overweight. ? A BMI of 30 and above is  considered obese.  Watch levels of cholesterol and blood lipids  You should start having your blood tested for lipids and cholesterol at 82 years of age, then have this test every 5 years.  You may need to have your cholesterol levels checked more often if: ? Your lipid or cholesterol levels are high. ? You are older than 82 years of age. ? You are at high risk for heart disease.  Cancer screening Lung Cancer  Lung cancer screening is recommended for adults 75-66 years old who are at high risk for lung cancer because of a history of smoking.  A yearly low-dose CT scan of the lungs is recommended for people who: ? Currently smoke. ? Have quit within the past 15 years. ? Have at least a 30-pack-year history of smoking. A pack year is smoking an average of one pack of cigarettes a day for 1 year.  Yearly screening should continue until it has been 15 years since you quit.  Yearly screening should stop if you develop a health problem that would prevent you from having lung cancer treatment.  Breast Cancer  Practice breast self-awareness. This means understanding how your breasts normally appear and feel.  It also means doing regular breast self-exams. Let your health care provider know about any changes, no matter how small.  If you are in your 20s or 30s, you should have a clinical breast exam (CBE) by a health care provider every 1-3 years as part of a regular health exam.  If  you are 40 or older, have a CBE every year. Also consider having a breast X-ray (mammogram) every year.  If you have a family history of breast cancer, talk to your health care provider about genetic screening.  If you are at high risk for breast cancer, talk to your health care provider about having an MRI and a mammogram every year.  Breast cancer gene (BRCA) assessment is recommended for women who have family members with BRCA-related cancers. BRCA-related cancers  include: ? Breast. ? Ovarian. ? Tubal. ? Peritoneal cancers.  Results of the assessment will determine the need for genetic counseling and BRCA1 and BRCA2 testing.  Cervical Cancer Your health care provider may recommend that you be screened regularly for cancer of the pelvic organs (ovaries, uterus, and vagina). This screening involves a pelvic examination, including checking for microscopic changes to the surface of your cervix (Pap test). You may be encouraged to have this screening done every 3 years, beginning at age 34.  For women ages 5-65, health care providers may recommend pelvic exams and Pap testing every 3 years, or they may recommend the Pap and pelvic exam, combined with testing for human papilloma virus (HPV), every 5 years. Some types of HPV increase your risk of cervical cancer. Testing for HPV may also be done on women of any age with unclear Pap test results.  Other health care providers may not recommend any screening for nonpregnant women who are considered low risk for pelvic cancer and who do not have symptoms. Ask your health care provider if a screening pelvic exam is right for you.  If you have had past treatment for cervical cancer or a condition that could lead to cancer, you need Pap tests and screening for cancer for at least 20 years after your treatment. If Pap tests have been discontinued, your risk factors (such as having a new sexual partner) need to be reassessed to determine if screening should resume. Some women have medical problems that increase the chance of getting cervical cancer. In these cases, your health care provider may recommend more frequent screening and Pap tests.  Colorectal Cancer  This type of cancer can be detected and often prevented.  Routine colorectal cancer screening usually begins at 82 years of age and continues through 82 years of age.  Your health care provider may recommend screening at an earlier age if you have risk factors  for colon cancer.  Your health care provider may also recommend using home test kits to check for hidden blood in the stool.  A small camera at the end of a tube can be used to examine your colon directly (sigmoidoscopy or colonoscopy). This is done to check for the earliest forms of colorectal cancer.  Routine screening usually begins at age 70.  Direct examination of the colon should be repeated every 5-10 years through 82 years of age. However, you may need to be screened more often if early forms of precancerous polyps or small growths are found.  Skin Cancer  Check your skin from head to toe regularly.  Tell your health care provider about any new moles or changes in moles, especially if there is a change in a mole's shape or color.  Also tell your health care provider if you have a mole that is larger than the size of a pencil eraser.  Always use sunscreen. Apply sunscreen liberally and repeatedly throughout the day.  Protect yourself by wearing long sleeves, pants, a wide-brimmed hat, and sunglasses  whenever you are outside.  Heart disease, diabetes, and high blood pressure  High blood pressure causes heart disease and increases the risk of stroke. High blood pressure is more likely to develop in: ? People who have blood pressure in the high end of the normal range (130-139/85-89 mm Hg). ? People who are overweight or obese. ? People who are African American.  If you are 64-28 years of age, have your blood pressure checked every 3-5 years. If you are 58 years of age or older, have your blood pressure checked every year. You should have your blood pressure measured twice-once when you are at a hospital or clinic, and once when you are not at a hospital or clinic. Record the average of the two measurements. To check your blood pressure when you are not at a hospital or clinic, you can use: ? An automated blood pressure machine at a pharmacy. ? A home blood pressure monitor.  If  you are between 46 years and 42 years old, ask your health care provider if you should take aspirin to prevent strokes.  Have regular diabetes screenings. This involves taking a blood sample to check your fasting blood sugar level. ? If you are at a normal weight and have a low risk for diabetes, have this test once every three years after 82 years of age. ? If you are overweight and have a high risk for diabetes, consider being tested at a younger age or more often. Preventing infection Hepatitis B  If you have a higher risk for hepatitis B, you should be screened for this virus. You are considered at high risk for hepatitis B if: ? You were born in a country where hepatitis B is common. Ask your health care provider which countries are considered high risk. ? Your parents were born in a high-risk country, and you have not been immunized against hepatitis B (hepatitis B vaccine). ? You have HIV or AIDS. ? You use needles to inject street drugs. ? You live with someone who has hepatitis B. ? You have had sex with someone who has hepatitis B. ? You get hemodialysis treatment. ? You take certain medicines for conditions, including cancer, organ transplantation, and autoimmune conditions.  Hepatitis C  Blood testing is recommended for: ? Everyone born from [birth year] through 1963. ? Anyone with known risk factors for hepatitis C.  Sexually transmitted infections (STIs)  You should be screened for sexually transmitted infections (STIs) including gonorrhea and chlamydia if: ? You are sexually active and are younger than 82 years of age. ? You are older than 82 years of age and your health care provider tells you that you are at risk for this type of infection. ? Your sexual activity has changed since you were last screened and you are at an increased risk for chlamydia or gonorrhea. Ask your health care provider if you are at risk.  If you do not have HIV, but are at risk, it may be recommended  that you take a prescription medicine daily to prevent HIV infection. This is called pre-exposure prophylaxis (PrEP). You are considered at risk if: ? You are sexually active and do not regularly use condoms or know the HIV status of your partner(s). ? You take drugs by injection. ? You are sexually active with a partner who has HIV.  Talk with your health care provider about whether you are at high risk of being infected with HIV. If you choose to begin PrEP, you should  first be tested for HIV. You should then be tested every 3 months for as long as you are taking PrEP. Pregnancy  If you are premenopausal and you may become pregnant, ask your health care provider about preconception counseling.  If you may become pregnant, take 400 to 800 micrograms (mcg) of folic acid every day.  If you want to prevent pregnancy, talk to your health care provider about birth control (contraception). Osteoporosis and menopause  Osteoporosis is a disease in which the bones lose minerals and strength with aging. This can result in serious bone fractures. Your risk for osteoporosis can be identified using a bone density scan.  If you are 38 years of age or older, or if you are at risk for osteoporosis and fractures, ask your health care provider if you should be screened.  Ask your health care provider whether you should take a calcium or vitamin D supplement to lower your risk for osteoporosis.  Menopause may have certain physical symptoms and risks.  Hormone replacement therapy may reduce some of these symptoms and risks. Talk to your health care provider about whether hormone replacement therapy is right for you. Follow these instructions at home:  Schedule regular health, dental, and eye exams.  Stay current with your immunizations.  Do not use any tobacco products including cigarettes, chewing tobacco, or electronic cigarettes.  If you are pregnant, do not drink alcohol.  If you are  breastfeeding, limit how much and how often you drink alcohol.  Limit alcohol intake to no more than 1 drink per day for nonpregnant women. One drink equals 12 ounces of beer, 5 ounces of wine, or 1 ounces of hard liquor.  Do not use street drugs.  Do not share needles.  Ask your health care provider for help if you need support or information about quitting drugs.  Tell your health care provider if you often feel depressed.  Tell your health care provider if you have ever been abused or do not feel safe at home. This information is not intended to replace advice given to you by your health care provider. Make sure you discuss any questions you have with your health care provider. Document Released: 10/17/2010 Document Revised: 09/09/2015 Document Reviewed: 01/05/2015 Elsevier Interactive Patient Education  [employer].

## 2017-08-15 DIAGNOSIS — M79605 Pain in left leg: Secondary | ICD-10-CM | POA: Diagnosis not present

## 2017-08-15 DIAGNOSIS — M25562 Pain in left knee: Secondary | ICD-10-CM | POA: Diagnosis not present

## 2017-08-16 ENCOUNTER — Other Ambulatory Visit (HOSPITAL_COMMUNITY): Payer: Self-pay | Admitting: Orthopaedic Surgery

## 2017-08-16 DIAGNOSIS — M7989 Other specified soft tissue disorders: Principal | ICD-10-CM

## 2017-08-16 DIAGNOSIS — M79604 Pain in right leg: Secondary | ICD-10-CM

## 2017-08-17 ENCOUNTER — Ambulatory Visit (HOSPITAL_COMMUNITY)
Admission: RE | Admit: 2017-08-17 | Discharge: 2017-08-17 | Disposition: A | Discharge status: Home / Self Care | Payer: Medicare Other | Source: Ambulatory Visit | Attending: Orthopaedic Surgery | Admitting: Orthopaedic Surgery

## 2017-08-17 ENCOUNTER — Telehealth (HOSPITAL_COMMUNITY): Payer: Self-pay

## 2017-08-17 DIAGNOSIS — M79605 Pain in left leg: Secondary | ICD-10-CM | POA: Insufficient documentation

## 2017-08-17 DIAGNOSIS — M7989 Other specified soft tissue disorders: Secondary | ICD-10-CM | POA: Insufficient documentation

## 2017-08-17 DIAGNOSIS — M79604 Pain in right leg: Secondary | ICD-10-CM

## 2017-08-17 NOTE — Progress Notes (Signed)
*  Preliminary Results* Left lower extremity venous duplex completed. Left lower extremity is negative for deep vein thrombosis. There is no evidence of left Baker's cyst.  08/17/2017 11:38 AM  Maudry Mayhew, BS, RVT, RDCS, RDMS

## 2017-08-17 NOTE — Telephone Encounter (Signed)
Called and spoke with patient in regards to Pulmonary Rehab - Scheduled orientation on 09/26/17 at 1:30pm. Patient will attend the 1:30pm exc class. Mailed packet.

## 2017-09-03 ENCOUNTER — Encounter: Payer: Self-pay | Admitting: Family Medicine

## 2017-09-03 ENCOUNTER — Ambulatory Visit (INDEPENDENT_AMBULATORY_CARE_PROVIDER_SITE_OTHER): Payer: Medicare Other | Admitting: Family Medicine

## 2017-09-03 VITALS — BP 130/72 | HR 59 | Temp 98.4°F | Resp 20 | Ht 61.0 in | Wt 154.6 lb

## 2017-09-03 DIAGNOSIS — E785 Hyperlipidemia, unspecified: Secondary | ICD-10-CM

## 2017-09-03 DIAGNOSIS — Z0001 Encounter for general adult medical examination with abnormal findings: Secondary | ICD-10-CM

## 2017-09-03 DIAGNOSIS — E038 Other specified hypothyroidism: Secondary | ICD-10-CM

## 2017-09-03 DIAGNOSIS — Z79899 Other long term (current) drug therapy: Secondary | ICD-10-CM | POA: Diagnosis not present

## 2017-09-03 DIAGNOSIS — R7303 Prediabetes: Secondary | ICD-10-CM

## 2017-09-03 DIAGNOSIS — J0100 Acute maxillary sinusitis, unspecified: Secondary | ICD-10-CM

## 2017-09-03 DIAGNOSIS — I10 Essential (primary) hypertension: Secondary | ICD-10-CM | POA: Diagnosis not present

## 2017-09-03 DIAGNOSIS — E559 Vitamin D deficiency, unspecified: Secondary | ICD-10-CM

## 2017-09-03 MED ORDER — OMEPRAZOLE 10 MG PO CPDR: 10.0000 mg | DELAYED_RELEASE_CAPSULE | Freq: Every day | ORAL | 0 refills | Status: DC

## 2017-09-03 MED ORDER — AMOXICILLIN-POT CLAVULANATE 875-125 MG PO TABS: 1.0000 | ORAL_TABLET | Freq: Two times a day (BID) | ORAL | 0 refills | Status: DC

## 2017-09-03 NOTE — Progress Notes (Signed)
[name], female  DOB: Jul 07, 1933, 84 y.o.   MRN: [id number] Patient Care Team    Relationship Specialty Notifications Start End  Ma Hillock, DO PCP - General Family Medicine  01/08/15   Shon Hough, MD Consulting Physician Ophthalmology  08/03/15   Collene Gobble, MD Consulting Physician Pulmonary Disease  08/03/15   Lelon Perla, MD Consulting Physician Cardiology  08/03/15   Garvin Fila, MD Consulting Physician Neurology  08/03/15   Inda Castle, MD (Inactive) Consulting Physician Gastroenterology  08/04/15   Philemon Kingdom, MD Consulting Physician Internal Medicine  08/07/16   Melrose Nakayama, MD Consulting Physician Orthopedic Surgery  08/07/16     Chief Complaint  Patient presents with  . Annual Exam    Subjective:  Amy Rodriguez is a 84 y.o.  Female  present for CPE. All past medical history, surgical history, allergies, family history, immunizations, medications and social history were updated in the electronic medical record today. All recent labs, ED visits and hospitalizations within the last year were reviewed. Sinusitis: pt endorses a cough, sinus pressure by her eyes, and feeling fatigued of 1 week duration. Her ears feel full. She denies fever, chills, nausea, or vomit.  Health maintenance:  Colonoscopy: completed 2017 1 polyp, no further testing needed secondary to age. , by dr. Loletha Carrow Mammogram: completed:11/27/2016, birads 1. Due August.([facility]) Immunizations: tdap 2014 UTD, Influenza UTD (encouraged yearly), PNA series completed, zostavax completed Infectious disease screening: N/A DEXA: last completed 08/31/2015, result -2.4, follow needed scheduled 10/04/2017 Assistive device: none Oxygen WIO:XBDZ Patient has a Dental home. Hospitalizations/ED visits: reviewed  Depression screen [facility] 2/9 08/14/2017 08/07/2016 08/03/2015 07/08/2014 12/05/2011  Decreased Interest 0 0 0 0 1  Down, Depressed, Hopeless 0 0 0 0 0  PHQ -  2 Score 0 0 0 0 1  Some recent data might be hidden   No flowsheet data found.   Current Exercise Habits: The patient does not participate in regular exercise at present Exercise limited by: None identified   Immunization History  Administered Date(s) Administered  . Influenza Split 01/12/2011, 02/09/2012  . Influenza Whole 02/08/2007, 01/24/2008, 12/16/2008, 01/27/2009, 02/17/2010  . Influenza, High Dose Seasonal PF 01/16/2017  . Influenza,inj,Quad PF,6+ Mos 12/17/2012, 12/19/2013, 01/08/2015, 01/06/2016  . Pneumococcal Conjugate-13 07/08/2014  . Pneumococcal Polysaccharide-23 05/23/2004  . Td 06/16/2002  . Tdap 12/17/2012  . Zoster 12/17/2012     Past Medical History:  Diagnosis Date  . Arthritis    sed rate 10, RF, CCP pending  . Blood in stool   . Cervical cancer ([place])    s/p hysterectomy/oop  . DDD (degenerative disc disease), lumbosacral   . Depression    Due to husbands passing away 09/22/2011  . Diverticulosis of colon   . Environmental allergies   . GERD (gastroesophageal reflux disease)   . H pylori ulcer    not treated due to expense  10/2001  . Hand dermatitis   . HTN (hypertension)   . Hyperlipidemia   . Hypothyroidism    TSH 14.488 (11/2005)  . IBS (irritable bowel syndrome)   . Lactose intolerance   . Migraines    "get them very rarely now" (02/08/2012)  . Multiple pigmented nevi    last derm evaluation 01/2003  . OA (osteoarthritis)    multiple sites  . Osteoporosis    "borderline" (02/08/2012)  . Other seborrheic keratosis   . Pain in joint, lower leg   . Pneumonia    "  couple times in my lifetime" (02/08/2012)  . PONV (postoperative nausea and vomiting)    "and takes me a long time to come out under it" (02/08/2012)  . Posterior vitreous detachment 1996  . Postmenopausal    s/p hysterectomy for h/o cervical cancer 1982 (both ovaries taken at that time) now on hormonal replacement   . Postmenopausal HRT (hormone replacement therapy)   . PPD  positive    history +PPD 1984, no treatment, no abnormal CXR  . Shortness of breath    with ambulation  . Stroke [facility])    TIA- April 2016 last one  . Supraventricular tachycardia, paroxysmal ([place])   . Urinary frequency   . Vaginal pruritus    Allergies  Allergen Reactions  . Benzonatate Nausea And Vomiting    "tessalon pearl"  . Sulfonamide Derivatives Itching and Rash  . Hctz [Hydrochlorothiazide] Other (See Comments)    Severe hyponatremia    Past Surgical History:  Procedure Laterality Date  . ABDOMINAL HYSTERECTOMY  1982  . CARDIOVASCULAR STRESS TEST  12/07/11   Normal nuclear stress test  . CATARACT EXTRACTION W/ INTRAOCULAR LENS IMPLANT  ?1997   right  . COLONOSCOPY    . EYE SURGERY     cataract removal right eye  . TOTAL ABDOMINAL HYSTERECTOMY W/ BILATERAL SALPINGOOPHORECTOMY  1982  . TOTAL KNEE ARTHROPLASTY  02/08/2012   Procedure: TOTAL KNEE ARTHROPLASTY;  Surgeon: Hessie Dibble, MD;  Location: [place];  Service: Orthopedics;  Laterality: Left;   Family History  Problem Relation Age of Onset  . Breast cancer Mother 36  . Lung cancer Brother 69  . Cancer Brother        lung  . Heart failure Father        congestive  . Heart disease Father   . Epilepsy Son   . Kidney disease Son        tuberous sclerosis, both kidneys removed, has transplant  . Diabetes Maternal Uncle   . Diabetes Paternal Aunt   . Cancer Maternal Grandmother        breast  . Breast cancer Maternal Grandmother   . Heart disease Son        [place]  . COPD Son   . Rectal cancer Maternal Aunt   . Colon cancer Neg Hx   . Esophageal cancer Neg Hx   . Stomach cancer Neg Hx    Social History   Socioeconomic History  . Marital status: Widowed    Spouse name: Not on file  . Number of children: 3  . Years of education: 12TH  . Highest education level: Not on file  Occupational History  . Occupation: retired    Comment: Academic librarian: RETIRED  Social Needs  . Financial  resource strain: Not on file  . Food insecurity:    Worry: Not on file    Inability: Not on file  . Transportation needs:    Medical: Not on file    Non-medical: Not on file  Tobacco Use  . Smoking status: Former Smoker    Packs/day: 1.00    Years: 25.00    Pack years: 25.00    Types: Cigarettes    Last attempt to quit: 04/17/1980    Years since quitting: 37.4  . Smokeless tobacco: Former Systems developer    Quit date: 03/16/1984  . Tobacco comment: passive smoker, husband smoked  Substance and Sexual Activity  . Alcohol use: No  . Drug use: No  . Sexual  activity: Never  Lifestyle  . Physical activity:    Days per week: Not on file    Minutes per session: Not on file  . Stress: Not on file  Relationships  . Social connections:    Talks on phone: Not on file    Gets together: Not on file    Attends religious service: Not on file    Active member of club or organization: Not on file    Attends meetings of clubs or organizations: Not on file    Relationship status: Not on file  . Intimate partner violence:    Fear of current or ex partner: Not on file    Emotionally abused: Not on file    Physically abused: Not on file    Forced sexual activity: Not on file  Other Topics Concern  . Not on file  Social History Narrative   Lives with son, [name] who has medical problems-they help each other. Her husband died 10/28/2022 after 80 years of marriage.  She has 2 other sons that live within 2 hrs from her. She does not drive-she uses [place] [facility]. She used to work in Scientist, research (medical), retired.     Tobacco: 25 pack yr hx, quit 1985.   Allergies as of 09/03/2017      Reactions   Benzonatate Nausea And Vomiting   "tessalon pearl"   Sulfonamide Derivatives Itching, Rash   Hctz [hydrochlorothiazide] Other (See Comments)   Severe hyponatremia       Medication List        Accurate as of 09/03/17  2:30 PM. Always use your most recent med list.          albuterol 108 (90 Base) MCG/ACT  inhaler Commonly known as:  PROAIR HFA Inhale 2 puffs into the lungs every 4 (four) hours as needed for wheezing or shortness of breath.   amLODipine 2.5 MG tablet Commonly known as:  NORVASC Take 1 tablet (2.5 mg total) by mouth 2 (two) times daily.   amoxicillin-clavulanate 875-125 MG tablet Commonly known as:  AUGMENTIN Take 1 tablet by mouth 2 (two) times daily.   aspirin EC 81 MG tablet Take 81 mg by mouth daily.   atenolol 25 MG tablet Commonly known as:  TENORMIN Take 0.5 tablets (12.5 mg total) by mouth 2 (two) times daily.   BREATHERITE COLL SPACER ADULT Misc Use with  Albuterol Inhaler.   fexofenadine 180 MG tablet Commonly known as:  ALLEGRA Take 1 tablet (180 mg total) by mouth daily.   levothyroxine 75 MCG tablet Commonly known as:  SYNTHROID, LEVOTHROID Take 1 tablet (75 mcg total) by mouth daily before breakfast.   omeprazole 10 MG capsule Commonly known as:  PRILOSEC Take 1 capsule (10 mg total) by mouth daily.   simvastatin 10 MG tablet Commonly known as:  ZOCOR Take 1 tablet (10 mg total) by mouth every evening.   Tiotropium Bromide Monohydrate 2.5 MCG/ACT Aers Commonly known as:  SPIRIVA RESPIMAT Inhale 2 puffs into the lungs daily.   VOLTAREN 1 % Gel Generic drug:  diclofenac sodium       All past medical history, surgical history, allergies, family history, immunizations andmedications were updated in the EMR today and reviewed under the history and medication portions of their EMR.     No results found.  ROS: 14 pt review of systems performed and negative (unless mentioned in an HPI)  Objective: BP 130/72 (BP Location: Left Arm, Patient Position: Sitting, Cuff Size: Normal)   Pulse Marland Kitchen)  59   Temp 98.4 F (36.9 C)   Resp 20   Ht _0  (1.549 m)   Wt 154 lb 9.6 oz (70.1 kg)   SpO2 96%   BMI 29.21 kg/m  Gen: Afebrile. No acute distress. Nontoxic in appearance, well-developed, well-nourished,  Pleasant caucasian female.  HENT: AT. [place].  Bilateral TM visualized and normal in appearance, mild fullness, normal external auditory canal. MMM, no oral lesions, adequate dentition. Bilateral nares within normal limits. Throat without erythema, ulcerations or exudates. no Cough on exam, no hoarseness on exam. Eyes:Pupils Equal Round Reactive to light, Extraocular movements intact,  Conjunctiva without redness, discharge or icterus. Neck/lymp/endocrine: Supple,no lymphadenopathy, no thyromegaly CV: RRR no murmur , noedema, +2/4 P posterior tibialis pulses. no carotid bruits. No JVD. Chest: CTAB, no wheeze, rhonchi or crackles. normal Respiratory effort.  Abd: Soft. round. NTND. BS present. no Masses palpated. No hepatosplenomegaly. No rebound tenderness or guarding. Skin: no rashes, purpura or petechiae. Warm and well-perfused. Skin intact. Bruising still present left LE, but much improved, swelling of knee resolved.  Neuro/Msk:  Normal gait. PERLA. EOMi. Alert. Oriented x3.  Cranial nerves II through XII intact. Muscle strength 5/5 upper/lower extremity. DTRs equal bilaterally. Psych: Normal affect, dress and demeanor. Normal speech. Normal thought content and judgment.  No exam data present  Assessment/plan: Amy Rodriguez is a 84 y.o. female present for CPE. HYPERTENSION, BENIGN ESSENTIAL Stable. Continue current regimen, supplied by cardio.  - CBC w/Diff; Future - Comp Met (CMET); Future - TSH; Future hypothyroidism - follows with endocrine.  - TSH; Future Prediabetes - HgB A1c; Future Hyperlipidemia, unspecified hyperlipidemia type - continue statin supplied by cardio - CBC w/Diff; Future - Lipid panel; Future Vitamin D deficiency/osteoporosis continue supplement, osteoporosis.  - Vitamin D (25 hydroxy); Future Acute maxillary sinusitis, recurrence not specified - acute issue addressed.  - Rest, hydrate.  + flonase, mucinex (DM if cough), nettie pot or nasal saline.  augmentin prescribed, take until completed.  If  cough present it can last up to 6-8 weeks.  F/U 2 weeks of not improved.  - amoxicillin-clavulanate (AUGMENTIN) 875-125 MG tablet; Take 1 tablet by mouth 2 (two) times daily.  Dispense: 20 tablet; Refill: 0 Encounter for long-term current use of medication Encounter for general adult medical examination with abnormal findings Patient was encouraged to exercise greater than 150 minutes a week. Patient was encouraged to choose a diet filled with fresh fruits and vegetables, and lean meats. AVS provided to patient today for education/recommendation on gender specific health and safety maintenance. Colonoscopy: completed 2017 1 polyp, no further testing needed secondary to age. , by dr. Loletha Carrow Mammogram: completed:11/27/2016, birads 1. Due August.([facility]) Immunizations: tdap 2014 UTD, Influenza UTD (encouraged yearly), PNA series completed, zostavax completed Infectious disease screening: N/A DEXA: last completed 08/31/2015, result -2.4, follow needed scheduled 10/04/2017  Return in about 1 year (around 09/04/2018) for CPE. 6 months chronic medical conditions.  Electronically signed by: Howard Pouch, DO [place]

## 2017-09-03 NOTE — Patient Instructions (Signed)

## 2017-09-11 ENCOUNTER — Other Ambulatory Visit (INDEPENDENT_AMBULATORY_CARE_PROVIDER_SITE_OTHER): Payer: Medicare Other

## 2017-09-11 DIAGNOSIS — E785 Hyperlipidemia, unspecified: Secondary | ICD-10-CM | POA: Diagnosis not present

## 2017-09-11 DIAGNOSIS — R7303 Prediabetes: Secondary | ICD-10-CM

## 2017-09-11 DIAGNOSIS — E038 Other specified hypothyroidism: Secondary | ICD-10-CM | POA: Diagnosis not present

## 2017-09-11 DIAGNOSIS — E559 Vitamin D deficiency, unspecified: Secondary | ICD-10-CM | POA: Diagnosis not present

## 2017-09-11 DIAGNOSIS — I10 Essential (primary) hypertension: Secondary | ICD-10-CM | POA: Diagnosis not present

## 2017-09-11 LAB — CBC WITH DIFFERENTIAL/PLATELET
Basophils Absolute: 0.1 10*3/uL (ref 0.0–0.1)
Basophils Relative: 1.2 % (ref 0.0–3.0)
EOS ABS: 0.4 10*3/uL (ref 0.0–0.7)
Eosinophils Relative: 6.8 % — ABNORMAL HIGH (ref 0.0–5.0)
HCT: 38.8 % (ref 36.0–46.0)
HEMOGLOBIN: 13.2 g/dL (ref 12.0–15.0)
LYMPHS ABS: 1.7 10*3/uL (ref 0.7–4.0)
Lymphocytes Relative: 32.4 % (ref 12.0–46.0)
MCHC: 34.1 g/dL (ref 30.0–36.0)
MCV: 85.2 fl (ref 78.0–100.0)
MONO ABS: 0.8 10*3/uL (ref 0.1–1.0)
Monocytes Relative: 14.8 % — ABNORMAL HIGH (ref 3.0–12.0)
NEUTROS PCT: 44.8 % (ref 43.0–77.0)
Neutro Abs: 2.4 10*3/uL (ref 1.4–7.7)
Platelets: 276 10*3/uL (ref 150.0–400.0)
RBC: 4.55 Mil/uL (ref 3.87–5.11)
RDW: 13.6 % (ref 11.5–15.5)
WBC: 5.3 10*3/uL (ref 4.0–10.5)

## 2017-09-11 LAB — TSH: TSH: 3.65 u[IU]/mL (ref 0.35–4.50)

## 2017-09-11 LAB — COMPREHENSIVE METABOLIC PANEL
ALBUMIN: 4.3 g/dL (ref 3.5–5.2)
ALK PHOS: 72 U/L (ref 39–117)
ALT: 9 U/L (ref 0–35)
AST: 15 U/L (ref 0–37)
BUN: 9 mg/dL (ref 6–23)
CO2: 27 mEq/L (ref 19–32)
CREATININE: 0.81 mg/dL (ref 0.40–1.20)
Calcium: 9.6 mg/dL (ref 8.4–10.5)
Chloride: 98 mEq/L (ref 96–112)
GFR: 71.98 mL/min (ref >60.00)
GLUCOSE: 110 mg/dL — AB (ref 70–99)
Potassium: 4.6 mEq/L (ref 3.5–5.1)
SODIUM: 135 meq/L (ref 135–145)
Total Bilirubin: 0.3 mg/dL (ref 0.2–1.2)
Total Protein: 7.3 g/dL (ref 6.0–8.3)

## 2017-09-11 LAB — HEMOGLOBIN A1C: Hgb A1c MFr Bld: 5.9 % (ref 4.6–6.5)

## 2017-09-11 LAB — LIPID PANEL
CHOL/HDL RATIO: 3
CHOLESTEROL: 118 mg/dL (ref 0–200)
HDL: 40.9 mg/dL (ref >39.00)
LDL Cholesterol: 51 mg/dL (ref 0–99)
NonHDL: 76.99
TRIGLYCERIDES: 130 mg/dL (ref 0.0–149.0)
VLDL: 26 mg/dL (ref 0.0–40.0)

## 2017-09-11 LAB — VITAMIN D 25 HYDROXY (VIT D DEFICIENCY, FRACTURES): VITD: 32.83 ng/mL (ref 30.00–100.00)

## 2017-09-20 ENCOUNTER — Telehealth (HOSPITAL_COMMUNITY): Payer: Self-pay

## 2017-09-20 NOTE — Telephone Encounter (Signed)
Patient called and spoke with Thayer Headings in regards to Pulmonary Rehab. Patient stated she is having to cancel orientation due to a sinus infection. She stated she will when she is ready. Closed referral.

## 2017-09-26 ENCOUNTER — Ambulatory Visit (HOSPITAL_COMMUNITY): Payer: Medicare Other

## 2017-10-04 ENCOUNTER — Ambulatory Visit
Admission: RE | Admit: 2017-10-04 | Discharge: 2017-10-04 | Disposition: A | Discharge status: Home / Self Care | Payer: Medicare Other | Source: Ambulatory Visit | Attending: Family Medicine | Admitting: Family Medicine

## 2017-10-04 ENCOUNTER — Telehealth: Payer: Self-pay | Admitting: Family Medicine

## 2017-10-04 ENCOUNTER — Encounter: Payer: Self-pay | Admitting: Family Medicine

## 2017-10-04 DIAGNOSIS — M8580 Other specified disorders of bone density and structure, unspecified site: Secondary | ICD-10-CM

## 2017-10-04 DIAGNOSIS — E2839 Other primary ovarian failure: Secondary | ICD-10-CM

## 2017-10-04 DIAGNOSIS — Z780 Asymptomatic menopausal state: Secondary | ICD-10-CM | POA: Diagnosis not present

## 2017-10-04 DIAGNOSIS — M8589 Other specified disorders of bone density and structure, multiple sites: Secondary | ICD-10-CM | POA: Diagnosis not present

## 2017-10-04 NOTE — Telephone Encounter (Signed)
Please inform patient the following information: Her bone density scan still osteoporotic at -2.2.  She did have a very mild improvement in the bone density in her lower lumbar spine, and the hip areas stayed the same.   She had been on Fosamax in the past, but is not currently taking it by med list.  She is at increased risk of fracture and would benefit from a prescribed medication.  -  Was there a side effect reason she stopped the Fosamax?    -If the answer is no, is she willing to restart?  If so, I will call it in.   -If the answer is yes, please add that to her allergy/intolerance list and if she desires we could send to specialist to be evaluated to see if she is a candidate for the IV infusion treatment called Reclast (one tx every 1-2 years).  -Regardless of her medication decision she should maximize calcium supplement 1200 mg, unless she has been told prior she should not add calcium.  -Maximize vitamin D supplement with at least 1000 units daily, if current dose is higher please stay at current dose (and update med list).

## 2017-10-05 NOTE — Telephone Encounter (Signed)
Spoke to patient reviewed bone density scan results. She states she stopped the fosamax because some of her family members had side effects and her dentist said some dental work could not be completed for patients on fosamax. She does not want to restart this medication but she is willing to see a specialist regarding alternative treatment options. She will take calcium and Vit D as instructed.

## 2017-10-08 NOTE — Telephone Encounter (Signed)
Referral to rheumatology to discuss other tx options for osteopenia. -2.2 bone density, intolerant to fosamax. (ie Reclast)

## 2017-10-22 NOTE — Progress Notes (Signed)
Office Visit Note  Patient: Amy Rodriguez             Date of Birth: 1933/06/17           MRN: [id number]             PCP: Ma Hillock, DO Referring: Ma Hillock, DO Visit Date: 11/05/2017 Occupation: @GUAROCC @    Subjective:  Management of osteopenia.   History of Present Illness: Amy Rodriguez is a 84 y.o. female seen in consultation per request of her PCP.  According to patient she was diagnosed with osteopenia 10 years ago.  She continues to have osteopenia.  She has not been taking calcium she takes vitamin D when prescribed.  Reports that she has never taken any other treatment for osteopenia.  She does not like the medications for osteoporosis as some of her relatives had a lot of side effects from bisphosphonates including problems with jaw and atypical fracture.  She states she has been having some discomfort in her right hip.  She continues to have pain and discomfort in her left knee which has been replaced.  She also has a stiffness in her hands from underlying osteoarthritis.  Most of her pain is tolerable.  Activities of Daily Living:  Patient reports morning stiffness for 30 minutes.   Patient Reports nocturnal pain.  Difficulty dressing/grooming: Denies Difficulty climbing stairs: Denies Difficulty getting out of chair: Reports Difficulty using hands for taps, buttons, cutlery, and/or writing: Reports   Review of Systems  Constitutional: Negative for fatigue, night sweats, weight gain and weight loss.  HENT: Positive for mouth dryness. Negative for mouth sores, trouble swallowing, trouble swallowing and nose dryness.   Eyes: Positive for dryness. Negative for pain, redness and visual disturbance.  Respiratory: Negative for cough, shortness of breath and difficulty breathing.        With activity   Cardiovascular: Negative for chest pain, palpitations, hypertension, irregular heartbeat and swelling in legs/feet.  Gastrointestinal: Negative for abdominal  pain, blood in stool, constipation, diarrhea, nausea and vomiting.  Endocrine: Negative for increased urination.  Genitourinary: Negative for pelvic pain and vaginal dryness.  Musculoskeletal: Positive for arthralgias, joint pain, joint swelling and morning stiffness. Negative for myalgias, muscle weakness, muscle tenderness and myalgias.  Skin: Negative for color change, rash, hair loss, skin tightness, ulcers and sensitivity to sunlight.  Allergic/Immunologic: Negative for susceptible to infections.  Neurological: Negative for dizziness, light-headedness, headaches, memory loss, night sweats and weakness.  Hematological: Positive for bruising/bleeding tendency. Negative for swollen glands.  Psychiatric/Behavioral: Negative for depressed mood, confusion and sleep disturbance. The patient is not nervous/anxious.     PMFS History:  Patient Active Problem List   Diagnosis Date Noted  . Aneurysm ([place]) 05/01/2016  . Osteopenia 08/03/2015  . Venous stasis 07/07/2015  . Emphysema lung ([place]) 01/08/2015  . B12 deficiency 07/08/2014  . Prediabetes 07/08/2014  . Hyperlipidemia 07/08/2014  . COPD (chronic obstructive pulmonary disease) ([place]) 04/29/2014  . Bronchiectasis without complication ([place]) 40/98/1191  . Simple partial seizure with special sensory symptoms ([place]) 11/04/2013  . Cerebrovascular disease 08/21/2013  . TIA (transient ischemic attack) 07/25/2013  . Hypothyroidism 02/13/2012    Class: Chronic  . Hyponatremia 02/10/2012  . Vitamin D deficiency 06/28/2009  . HYPERTENSION, BENIGN ESSENTIAL 06/03/2008  . TACHYCARDIA, PAROXYSMAL SUPRAVENTRICULAR 06/14/2006  . OSTEOARTHRITIS, MULTI SITES 06/14/2006  . DDD (degenerative disc disease), lumbosacral 06/14/2006    Past Medical History:  Diagnosis Date  . Arthritis    sed  rate 10, RF, CCP pending  . Blood in stool   . Cervical cancer ([place])    s/p hysterectomy/oop  . DDD (degenerative disc disease), lumbosacral   . Depression     Due to husbands passing away 09/22/2011  . Diverticulosis of colon   . Environmental allergies   . GERD (gastroesophageal reflux disease)   . H pylori ulcer    not treated due to expense  10/2001  . Hand dermatitis   . HTN (hypertension)   . Hyperlipidemia   . Hypothyroidism    TSH 14.488 (11/2005)  . IBS (irritable bowel syndrome)   . Lactose intolerance   . Migraines    "get them very rarely now" (02/08/2012)  . Multiple pigmented nevi    last derm evaluation 01/2003  . OA (osteoarthritis)    multiple sites  . Other seborrheic keratosis   . Pain in joint, lower leg   . Pneumonia    "couple times in my lifetime" (02/08/2012)  . PONV (postoperative nausea and vomiting)    "and takes me a long time to come out under it" (02/08/2012)  . Posterior vitreous detachment 1996  . Postmenopausal    s/p hysterectomy for h/o cervical cancer 1982 (both ovaries taken at that time) now on hormonal replacement   . Postmenopausal HRT (hormone replacement therapy)   . PPD positive    history +PPD 1984, no treatment, no abnormal CXR  . Shortness of breath    with ambulation  . Stroke [facility])    TIA- April 2016 last one  . Supraventricular tachycardia, paroxysmal ([place])   . Urinary frequency   . Vaginal pruritus     Family History  Problem Relation Age of Onset  . Breast cancer Mother 53  . Lung cancer Brother 4  . Cancer Brother        lung  . Heart failure Father        congestive  . Heart disease Father   . Epilepsy Son   . Kidney disease Son        tuberous sclerosis, both kidneys removed, has transplant  . Diabetes Maternal Uncle   . Diabetes Paternal Aunt   . Cancer Maternal Grandmother        breast  . Breast cancer Maternal Grandmother   . Heart disease Son        [place]  . COPD Son   . Rectal cancer Maternal Aunt   . Colon cancer Neg Hx   . Esophageal cancer Neg Hx   . Stomach cancer Neg Hx    Past Surgical History:  Procedure Laterality Date  . ABDOMINAL  HYSTERECTOMY  1982  . CARDIOVASCULAR STRESS TEST  12/07/11   Normal nuclear stress test  . CATARACT EXTRACTION W/ INTRAOCULAR LENS IMPLANT  ?1997   right  . COLONOSCOPY    . EYE SURGERY     cataract removal right eye  . TOTAL ABDOMINAL HYSTERECTOMY W/ BILATERAL SALPINGOOPHORECTOMY  1982  . TOTAL KNEE ARTHROPLASTY  02/08/2012   Procedure: TOTAL KNEE ARTHROPLASTY;  Surgeon: Hessie Dibble, MD;  Location: [place];  Service: Orthopedics;  Laterality: Left;   Social History   Social History Narrative   Lives with son, [name] who has medical problems-they help each other. Her husband died 10-13-2022 after 11 years of marriage.  She has 2 other sons that live within 2 hrs from her. She does not drive-she uses [facility]. She used to work in [employer] (medical), retired.  Tobacco: 25 pack yr hx, quit 1985.     Objective: Vital Signs: BP (!) 157/76 (BP Location: Right Arm, Patient Position: Sitting, Cuff Size: Normal)   Pulse (!) 55   Resp 13   Ht 5' 0.83" (1.545 m)   Wt 157 lb (71.2 kg)   BMI 29.83 kg/m    Physical Exam  Constitutional: She is oriented to person, place, and time. She appears well-developed and well-nourished.  HENT:  Head: Normocephalic and atraumatic.  Eyes: Conjunctivae and EOM are normal.  Neck: Normal range of motion.  Cardiovascular: Normal rate, regular rhythm, normal heart sounds and intact distal pulses.  Pulmonary/Chest: Effort normal and breath sounds normal.  Abdominal: Soft. Bowel sounds are normal.  Lymphadenopathy:    She has no cervical adenopathy.  Neurological: She is alert and oriented to person, place, and time.  Skin: Skin is warm and dry. Capillary refill takes less than 2 seconds.  Psychiatric: She has a normal mood and affect. Her behavior is normal.  Nursing note and vitals reviewed.    Musculoskeletal Exam: C-spine limited range of motion.  She has thoracic kyphosis.  Shoulder joints elbow joints wrist joints were in good range of motion.  She  has DIP PIP thickening and subluxation consistent with osteoarthritis.  She had tenderness on palpation of her right trochanteric bursa.  She has left total knee replacement which was warm to touch.  She has some osteoarthritic changes in her feet.  CDAI Exam: No CDAI exam completed.    Investigation: No additional findings. CBC    Component Value Date/Time   WBC 5.3 09/11/2017 0931   RBC 4.55 09/11/2017 0931   HGB 13.2 09/11/2017 0931   HCT 38.8 09/11/2017 0931   PLT 276.0 09/11/2017 0931   MCV 85.2 09/11/2017 0931   MCH 28.3 08/03/2016 1018   MCHC 34.1 09/11/2017 0931   RDW 13.6 09/11/2017 0931   LYMPHSABS 1.7 09/11/2017 0931   MONOABS 0.8 09/11/2017 0931   EOSABS 0.4 09/11/2017 0931   BASOSABS 0.1 09/11/2017 0931   CMP Latest Ref Rng & Units 09/11/2017 08/03/2016 07/19/2016  Glucose 70 - 99 mg/dL 110(H) 81 99  BUN 6 - 23 mg/dL 9 9 10   Creatinine 0.40 - 1.20 mg/dL 0.81 0.84 0.86  Sodium 135 - 145 mEq/L 135 136 132(L)  Potassium 3.5 - 5.1 mEq/L 4.6 5.0 5.0  Chloride 96 - 112 mEq/L 98 103 98  CO2 19 - 32 mEq/L 27 26 23   Calcium 8.4 - 10.5 mg/dL 9.6 8.9 9.1  Total Protein 6.0 - 8.3 g/dL 7.3 - -  Total Bilirubin 0.2 - 1.2 mg/dL 0.3 - -  Alkaline Phos 39 - 117 U/L 72 - -  AST 0 - 37 U/L 15 - -  ALT 0 - 35 U/L 9 - -   09/11/17 Vit D 32.83, TSH normal  Imaging: No results found.  Speciality Comments: No specialty comments available.    Procedures:  No procedures performed Allergies: Benzonatate; Sulfonamide derivatives; Fosamax [alendronate]; and Hctz [hydrochlorothiazide]   Assessment / Plan:     Visit Diagnoses: Osteopenia, unspecified location - DEXA 10/04/17: The BMD measured at Femur Neck Left is 0.736 g/cm2 with a T-score of -2.2.  We had detailed discussion regarding osteopenia and its management.  She is on calcium and vitamin D.  She does not like bisphosphonates.  I do not see any need to start her on bisphosphonates at this time.  She has no history of vertebral  fractures or stress fracture.  Ideally she should continue to exercise and take calcium and vitamin D.  The dosing of her calcium and vitamin D was discussed.  I would recommend for her to have a repeat bone density in 2 years.  If that showed osteoporosis then we may have to consider bisphosphonates.  Vitamin D deficiency-she takes vitamin D prescribed by her PCP on PRN basis as needed.  Trochanteric bursitis of right hip-she had pain and discomfort in the right trochanteric bursa.  I believe the discomfort comes from her abnormal gait due to left total knee replacement.  Handout on IT been exercise was given.  Status post total left knee replacement-she continues to have warmth swelling and discomfort in her left knee which is been replaced.  Primary osteoarthritis of both hands-she has severe osteoarthritis in her bilateral hands.  Joint protection muscle strengthening was discussed.  DDD (degenerative disc disease), lumbosacral-she has some lower back discomfort which is tolerable.  Other medical problems are listed as follows:  TIA (transient ischemic attack)  Cerebrovascular disease  SVT (supraventricular tachycardia) ([place])  Essential hypertension  B12 deficiency  History of hyperlipidemia  Simple partial seizure with special sensory symptoms ([facility])  Chronic obstructive pulmonary disease, unspecified COPD type ([place])    Orders: No orders of the defined types were placed in this encounter.  No orders of the defined types were placed in this encounter.   Face-to-face time spent with patient was 50 minutes. Greater than 50% of time was spent in counseling and coordination of care.  Follow-Up Instructions: Return in about 2 years (around 11/06/2019) for Osteopenia, Osteoarthritis.   Bo Merino, MD  Note - This record has been created using Editor, commissioning.  Chart creation errors have been sought, but may not always  have been located. Such creation errors do not  reflect on  the standard of medical care.

## 2017-11-05 ENCOUNTER — Encounter: Payer: Self-pay | Admitting: Rheumatology

## 2017-11-05 ENCOUNTER — Ambulatory Visit: Payer: Medicare Other | Admitting: Internal Medicine

## 2017-11-05 ENCOUNTER — Ambulatory Visit (INDEPENDENT_AMBULATORY_CARE_PROVIDER_SITE_OTHER): Payer: Medicare Other | Admitting: Rheumatology

## 2017-11-05 VITALS — BP 157/76 | HR 55 | Resp 13 | Ht 60.83 in | Wt 157.0 lb

## 2017-11-05 DIAGNOSIS — J449 Chronic obstructive pulmonary disease, unspecified: Secondary | ICD-10-CM

## 2017-11-05 DIAGNOSIS — M5137 Other intervertebral disc degeneration, lumbosacral region: Secondary | ICD-10-CM

## 2017-11-05 DIAGNOSIS — Z96652 Presence of left artificial knee joint: Secondary | ICD-10-CM

## 2017-11-05 DIAGNOSIS — M7061 Trochanteric bursitis, right hip: Secondary | ICD-10-CM

## 2017-11-05 DIAGNOSIS — G459 Transient cerebral ischemic attack, unspecified: Secondary | ICD-10-CM

## 2017-11-05 DIAGNOSIS — M51379 Other intervertebral disc degeneration, lumbosacral region without mention of lumbar back pain or lower extremity pain: Secondary | ICD-10-CM

## 2017-11-05 DIAGNOSIS — I471 Supraventricular tachycardia, unspecified: Secondary | ICD-10-CM

## 2017-11-05 DIAGNOSIS — E538 Deficiency of other specified B group vitamins: Secondary | ICD-10-CM

## 2017-11-05 DIAGNOSIS — Z8639 Personal history of other endocrine, nutritional and metabolic disease: Secondary | ICD-10-CM

## 2017-11-05 DIAGNOSIS — I679 Cerebrovascular disease, unspecified: Secondary | ICD-10-CM

## 2017-11-05 DIAGNOSIS — R569 Unspecified convulsions: Secondary | ICD-10-CM

## 2017-11-05 DIAGNOSIS — M19041 Primary osteoarthritis, right hand: Secondary | ICD-10-CM

## 2017-11-05 DIAGNOSIS — E559 Vitamin D deficiency, unspecified: Secondary | ICD-10-CM

## 2017-11-05 DIAGNOSIS — M19042 Primary osteoarthritis, left hand: Secondary | ICD-10-CM

## 2017-11-05 DIAGNOSIS — M8580 Other specified disorders of bone density and structure, unspecified site: Secondary | ICD-10-CM | POA: Diagnosis not present

## 2017-11-05 DIAGNOSIS — G40109 Localization-related (focal) (partial) symptomatic epilepsy and epileptic syndromes with simple partial seizures, not intractable, without status epilepticus: Secondary | ICD-10-CM

## 2017-11-05 DIAGNOSIS — I10 Essential (primary) hypertension: Secondary | ICD-10-CM

## 2017-11-05 NOTE — Patient Instructions (Signed)
Iliotibial Band Syndrome Rehab  Ask your health care provider which exercises are safe for you. Do exercises exactly as told by your health care provider and adjust them as directed. It is normal to feel mild stretching, pulling, tightness, or discomfort as you do these exercises, but you should stop right away if you feel sudden pain or your pain gets worse. Do not begin these exercises until told by your health care provider.  Stretching and range of motion exercises  These exercises warm up your muscles and joints and improve the movement and flexibility of your hip and pelvis.  Exercise A: Quadriceps, prone    1. Lie on your abdomen on a firm surface, such as a bed or padded floor.  2. Bend your left / right knee and hold your ankle. If you cannot reach your ankle or pant leg, loop a belt around your foot and grab the belt instead.  3. Gently pull your heel toward your buttocks. Your knee should not slide out to the side. You should feel a stretch in the front of your thigh and knee.  4. Hold this position for __________ seconds.  Repeat __________ times. Complete this stretch __________ times a day.  Exercise B: Iliotibial band    1. Lie on your side with your left / right leg in the top position.  2. Bend both of your knees and grab your left / right ankle. Stretch out your bottom arm to help you balance.  3. Slowly bring your top knee back so your thigh goes behind your trunk.  4. Slowly lower your top leg toward the floor until you feel a gentle stretch on the outside of your left / right hip and thigh. If you do not feel a stretch and your knee will not fall farther, place the heel of your other foot on top of your knee and pull your knee down toward the floor with your foot.  5. Hold this position for __________ seconds.  Repeat __________ times. Complete this stretch __________ times a day.  Strengthening exercises  These exercises build strength and endurance in your hip and pelvis. Endurance is the  ability to use your muscles for a long time, even after they get tired.  Exercise C: Straight leg raises (  hip abductors)  1. Lie on your side with your left / right leg in the top position. Lie so your head, shoulder, knee, and hip line up. You may bend your bottom knee to help you balance.  2. Roll your hips slightly forward so your hips are stacked directly over each other and your left / right knee is facing forward.  3. Tense the muscles in your outer thigh and lift your top leg 4-6 inches (10-15 cm).  4. Hold this position for __________ seconds.  5. Slowly return to the starting position. Let your muscles relax completely before doing another repetition.  Repeat __________ times. Complete this exercise __________ times a day.  Exercise D: Straight leg raises (  hip extensors)  1. Lie on your abdomen on your bed or a firm surface. You can put a pillow under your hips if that is more comfortable.  2. Bend your left / right knee so your foot is straight up in the air.  3. Squeeze your buttock muscles and lift your left / right thigh off the bed. Do not let your back arch.  4. Tense this muscle as hard as you can without increasing any knee pain.    5. Hold this position for __________ seconds.  6. Slowly lower your leg to the starting position and allow it to relax completely.  Repeat __________ times. Complete this exercise __________ times a day.  Exercise E: Hip hike  1. Stand sideways on a bottom step. Stand on your left / right leg with your other foot unsupported next to the step. You can hold onto the railing or wall if needed for balance.  2. Keep your knees straight and your torso square. Then, lift your left / right hip up toward the ceiling.  3. Slowly let your left / right hip lower toward the floor, past the starting position. Your foot should get closer to the floor. Do not lean or bend your knees.  Repeat __________ times. Complete this exercise __________ times a day.  This information is not  intended to replace advice given to you by your health care provider. Make sure you discuss any questions you have with your health care provider.  Document Released: 04/03/2005 Document Revised: 12/07/2015 Document Reviewed: 03/05/2015  [employer]vier Interactive Patient Education © 2018 [employer]

## 2017-11-06 ENCOUNTER — Encounter: Payer: Self-pay | Admitting: Internal Medicine

## 2017-11-06 ENCOUNTER — Ambulatory Visit (INDEPENDENT_AMBULATORY_CARE_PROVIDER_SITE_OTHER): Payer: Medicare Other | Admitting: Internal Medicine

## 2017-11-06 VITALS — BP 110/60 | HR 65 | Ht 61.0 in | Wt 155.6 lb

## 2017-11-06 DIAGNOSIS — E063 Autoimmune thyroiditis: Secondary | ICD-10-CM

## 2017-11-06 DIAGNOSIS — E038 Other specified hypothyroidism: Secondary | ICD-10-CM | POA: Diagnosis not present

## 2017-11-06 MED ORDER — LEVOTHYROXINE SODIUM 75 MCG PO TABS: 75.0000 ug | ORAL_TABLET | Freq: Every day | ORAL | 3 refills | Status: DC

## 2017-11-06 NOTE — Progress Notes (Signed)
[name], female   DOB: 1933/07/13, 84 y.o.   MRN: [id number]   HPI  Amy Rodriguez is a 84 y.o.-year-old female, returning for f/u for hypothyroidism 2/2 to Hashimoto's thyroiditis and R goiter. Last visit 1 year ago.  Pt. has been dx with hypothyroidism  in 2011,  and as Hashimoto's thyroiditis in 2016  Pt is on levothyroxine 75 mcg daily, taken: - in am - fasting - at least 30 min from b'fast (are usually 1 hour) - no Fe, MVI - + Tums later at night - + prn PPIs later in the day - not on Biotin  Reviewed patient's TFTs: Normal Lab Results  Component Value Date   TSH 3.65 09/11/2017   TSH 2.96 11/03/2016   TSH 2.13 11/04/2015   TSH 2.57 08/03/2015   TSH 2.47 10/20/2014   TSH 1.63 07/08/2014   TSH 1.15 12/19/2013   TSH 0.78 09/19/2013   TSH 8.120 (H) 07/26/2013   TSH 3.56 12/17/2012   FREET4 0.86 11/03/2016   FREET4 0.95 11/04/2015   FREET4 1.03 07/08/2014   FREET4 1.39 03/18/2012    TPO antibodies are elevated, indicating Hashimoto's thyroiditis: Component     Latest Ref Rng & Units 07/08/2014 10/20/2014  Thyroperoxidase Ab SerPl-aCnc     <9 IU/mL >900 (H) >900 (H)   Enlarged right thyroid lobe -  initially felt on palpation  Thyroid ultrasound (07/09/2014): Enlarged right lobe, with hypoechoic aspect and moth-eaten appearance suggestive of Hashimoto's thyroiditis.  Normal left lobe.  No nodules  Pt denies: - feeling nodules in neck - hoarseness - dysphagia - choking - SOB with lying down  She also has HL, HTN, GERD, vit D def., bronchiectasis, TIA - small aneurysm - being followed by Dr. Leonie Rodriguez . She had L TKR 2013.  ROS: Constitutional: no weight gain/no weight loss, no fatigue, no subjective hyperthermia, no subjective hypothermia Eyes: no blurry vision, no xerophthalmia ENT: no sore throat, + see HPI Cardiovascular: no CP/no SOB/no palpitations/no leg swelling Respiratory: no cough/no SOB/no wheezing Gastrointestinal: no N/no V/no D/no  C/no acid reflux Musculoskeletal: no muscle aches/no joint aches Skin: no rashes, no hair loss Neurological: no tremors/no numbness/no tingling/no dizziness  I reviewed pt's medications, allergies, PMH, social hx, family hx, and changes were documented in the history of present illness. Otherwise, unchanged from my initial visit note. Started Amlodipine.  Past Medical History:  Diagnosis Date  . Arthritis    sed rate 10, RF, CCP pending  . Blood in stool   . Cervical cancer ([place])    s/p hysterectomy/oop  . DDD (degenerative disc disease), lumbosacral   . Depression    Due to husbands passing away 09/22/2011  . Diverticulosis of colon   . Environmental allergies   . GERD (gastroesophageal reflux disease)   . H pylori ulcer    not treated due to expense  10/2001  . Hand dermatitis   . HTN (hypertension)   . Hyperlipidemia   . Hypothyroidism    TSH 14.488 (11/2005)  . IBS (irritable bowel syndrome)   . Lactose intolerance   . Migraines    "get them very rarely now" (02/08/2012)  . Multiple pigmented nevi    last derm evaluation 01/2003  . OA (osteoarthritis)    multiple sites  . Other seborrheic keratosis   . Pain in joint, lower leg   . Pneumonia    "couple times in my lifetime" (02/08/2012)  . PONV (postoperative nausea and vomiting)    "and  takes me a long time to come out under it" (02/08/2012)  . Posterior vitreous detachment 1996  . Postmenopausal    s/p hysterectomy for h/o cervical cancer 1982 (both ovaries taken at that time) now on hormonal replacement   . Postmenopausal HRT (hormone replacement therapy)   . PPD positive    history +PPD 1984, no treatment, no abnormal CXR  . Shortness of breath    with ambulation  . Stroke [facility])    TIA- April 2016 last one  . Supraventricular tachycardia, paroxysmal ([place])   . Urinary frequency   . Vaginal pruritus    Past Surgical History:  Procedure Laterality Date  . ABDOMINAL HYSTERECTOMY  1982  . CARDIOVASCULAR STRESS  TEST  12/07/11   Normal nuclear stress test  . CATARACT EXTRACTION W/ INTRAOCULAR LENS IMPLANT  ?1997   right  . COLONOSCOPY    . EYE SURGERY     cataract removal right eye  . TOTAL ABDOMINAL HYSTERECTOMY W/ BILATERAL SALPINGOOPHORECTOMY  1982  . TOTAL KNEE ARTHROPLASTY  02/08/2012   Procedure: TOTAL KNEE ARTHROPLASTY;  Surgeon: Amy Dibble, MD;  Location: [place];  Service: Orthopedics;  Laterality: Left;   Social History   Socioeconomic History  . Marital status: Widowed    Spouse name: Not on file  . Number of children: 3  . Years of education: 12TH  . Highest education level: Not on file  Occupational History  . Occupation: retired    Comment: Academic librarian: RETIRED  Social Needs  . Financial resource strain: Not on file  . Food insecurity:    Worry: Not on file    Inability: Not on file  . Transportation needs:    Medical: Not on file    Non-medical: Not on file  Tobacco Use  . Smoking status: Former Smoker    Packs/day: 1.00    Years: 25.00    Pack years: 25.00    Types: Cigarettes    Last attempt to quit: 04/17/1980    Years since quitting: 37.5  . Smokeless tobacco: Never Used  . Tobacco comment: passive smoker, husband smoked  Substance and Sexual Activity  . Alcohol use: No  . Drug use: No  . Sexual activity: Never  Lifestyle  . Physical activity:    Days per week: Not on file    Minutes per session: Not on file  . Stress: Not on file  Relationships  . Social connections:    Talks on phone: Not on file    Gets together: Not on file    Attends religious service: Not on file    Active member of club or organization: Not on file    Attends meetings of clubs or organizations: Not on file    Relationship status: Not on file  . Intimate partner violence:    Fear of current or ex partner: Not on file    Emotionally abused: Not on file    Physically abused: Not on file    Forced sexual activity: Not on file  Other Topics Concern  . Not on file    Social History Narrative   Lives with son, Amy Rodriguez who has medical problems-they help each other. Her husband died Oct 20, 2022 after 62 years of marriage.  She has 2 other sons that live within 2 hrs from her. She does not drive-she uses [place] medical service. She used to work in Scientist, research (medical), retired.     Tobacco: 25 pack yr hx, quit 1985.   Current  Outpatient Medications on File Prior to Visit  Medication Sig Dispense Refill  . albuterol (PROAIR HFA) 108 (90 Base) MCG/ACT inhaler Inhale 2 puffs into the lungs every 4 (four) hours as needed for wheezing or shortness of breath. 1 Inhaler 3  . amLODipine (NORVASC) 2.5 MG tablet Take 1 tablet (2.5 mg total) by mouth 2 (two) times daily. (Patient taking differently: Take 2.5 mg by mouth daily. ) 180 tablet 3  . aspirin EC 81 MG tablet Take 81 mg by mouth daily.    Marland Kitchen atenolol (TENORMIN) 25 MG tablet Take 0.5 tablets (12.5 mg total) by mouth 2 (two) times daily. 90 tablet 3  . fexofenadine (ALLEGRA) 180 MG tablet Take 1 tablet (180 mg total) by mouth daily. (Patient taking differently: Take 180 mg by mouth as needed. ) 30 tablet 11  . levothyroxine (SYNTHROID, LEVOTHROID) 75 MCG tablet Take 1 tablet (75 mcg total) by mouth daily before breakfast. 90 tablet 3  . omeprazole (PRILOSEC) 10 MG capsule Take 1 capsule (10 mg total) by mouth daily. (Patient taking differently: Take 10 mg by mouth as needed. ) 90 capsule 0  . simvastatin (ZOCOR) 10 MG tablet Take 1 tablet (10 mg total) by mouth every evening. 90 tablet 3  . Spacer/Aero-Holding Chambers (BREATHERITE COLL SPACER ADULT) MISC Use with  Albuterol Inhaler. 1 each 0  . Tiotropium Bromide Monohydrate (SPIRIVA RESPIMAT) 2.5 MCG/ACT AERS Inhale 2 puffs into the lungs daily. (Patient not taking: Reported on 11/05/2017) 1 Inhaler 5  . VOLTAREN 1 % GEL as needed.   2   No current facility-administered medications on file prior to visit.    Allergies  Allergen Reactions  . Benzonatate Nausea And Vomiting    "tessalon  pearl"  . Sulfonamide Derivatives Itching and Rash  . Fosamax [Alendronate]   . Hctz [Hydrochlorothiazide] Other (See Comments)    Severe hyponatremia    Family History  Problem Relation Age of Onset  . Breast cancer Mother 80  . Lung cancer Brother 6  . Cancer Brother        lung  . Heart failure Father        congestive  . Heart disease Father   . Epilepsy Son   . Kidney disease Son        tuberous sclerosis, both kidneys removed, has transplant  . Diabetes Maternal Uncle   . Diabetes Paternal Aunt   . Cancer Maternal Grandmother        breast  . Breast cancer Maternal Grandmother   . Heart disease Son        [place]  . COPD Son   . Rectal cancer Maternal Aunt   . Colon cancer Neg Hx   . Esophageal cancer Neg Hx   . Stomach cancer Neg Hx    PE: BP 110/60   Pulse 65   Ht 5\' 1"  (1.549 m)   Wt 155 lb 9.6 oz (70.6 kg)   SpO2 94%   BMI 29.40 kg/m  Body mass index is 29.4 kg/m. Wt Readings from Last 3 Encounters:  11/06/17 155 lb 9.6 oz (70.6 kg)  11/05/17 157 lb (71.2 kg)  09/03/17 154 lb 9.6 oz (70.1 kg)   Constitutional: overweight, in NAD Eyes: PERRLA, EOMI, no exophthalmos ENT: moist mucous membranes, + R thyromegaly, no cervical lymphadenopathy Cardiovascular: RRR, No MRG Respiratory: CTA B Gastrointestinal: abdomen soft, NT, ND, BS+ Musculoskeletal: no deformities, strength intact in all 4 Skin: moist, warm, no rashes Neurological: no tremor with outstretched  hands, DTR normal in all 4  ASSESSMENT: 1. Hypothyroidism - 2/2 Hashimoto's thyroiditis  2.  Right goiter - Thyroid U/S (07/09/2014):   Right thyroid lobe: 7.6 x 3.3 x 3.6 cm. The right thyroid lobe is diffusely enlarged and hypoechoic. Right thyroid tissue is mildly lobular but there is not a focal nodule. (honeycomb appearance)   Left thyroid lobe: 5.1 x 2.2 x 2.2 cm. Left thyroid tissue is heterogeneous but more hyperechoic than the right thyroid lobe and isthmus. There is not  a focal nodule. Left thyroid tissue is very vascular.   Isthmus Thickness: 0.8 cm. Isthmus is prominent for size and has a similar hypoechoic appearance as the right thyroid lobe.   Lymphadenopathy None visualized.    PLAN:  1. Patient with several years of Hashimoto's hypothyroidism, with good control - latest thyroid labs reviewed with pt >> normal  - she continues on LT4 75 mcg daily - pt feels good on this dose. - we discussed about taking the thyroid hormone every day, with water, >30 minutes before breakfast, separated by >4 hours from acid reflux medications, calcium, iron, multivitamins. Pt. is taking it correctly. - She had TFTs checked 1.5 months ago and these were normal.  We will not repeat them today, but we discussed that she can continue to have them checked by PCP annually or biannually and have them sent to me if abnormal. - RTC in 1 year  2. Right thyroid lobe enlargement -  Reviewed the images of her thyroid ultrasound from 2016: Large right thyroid lobe, however, without nodules, and heterogeneous aspect, consistent with Hashimoto's thyroiditis.  It also contains septations and it is hypoechoic. - She denies neck compression symptoms - We do not need to repeat a thyroid ultrasound unless she starts having neck compression symptoms   Philemon Kingdom, MD PhD [facility] Endocr[facility]

## 2017-11-06 NOTE — Patient Instructions (Signed)
Please continue Levothyroxine 75 mcg.  Take the thyroid hormone every day, with water, at least 30 minutes before breakfast, separated by at least 4 hours from: - acid reflux medications - calcium - iron - multivitamins  Please return in 1 year.

## 2017-11-21 ENCOUNTER — Other Ambulatory Visit: Payer: Self-pay

## 2017-11-21 ENCOUNTER — Ambulatory Visit (INDEPENDENT_AMBULATORY_CARE_PROVIDER_SITE_OTHER): Payer: Medicare Other | Admitting: Family Medicine

## 2017-11-21 ENCOUNTER — Encounter: Payer: Self-pay | Admitting: Family Medicine

## 2017-11-21 VITALS — BP 131/81 | HR 63 | Temp 98.1°F | Resp 16 | Ht 61.0 in | Wt 157.0 lb

## 2017-11-21 DIAGNOSIS — L089 Local infection of the skin and subcutaneous tissue, unspecified: Secondary | ICD-10-CM

## 2017-11-21 DIAGNOSIS — S70362A Insect bite (nonvenomous), left thigh, initial encounter: Secondary | ICD-10-CM

## 2017-11-21 DIAGNOSIS — W57XXXA Bitten or stung by nonvenomous insect and other nonvenomous arthropods, initial encounter: Secondary | ICD-10-CM | POA: Diagnosis not present

## 2017-11-21 MED ORDER — DOXYCYCLINE HYCLATE 100 MG PO TABS: 100.0000 mg | ORAL_TABLET | Freq: Two times a day (BID) | ORAL | 0 refills | Status: DC

## 2017-11-21 NOTE — Patient Instructions (Signed)
Follow up as needed or as scheduled START the Doxycycline twice daily- take w/ food- for 7 days If you have worsening redness, pus, or other concerns- let us know Hang in there!!!

## 2017-11-21 NOTE — Progress Notes (Signed)
   Subjective:    [name], female    DOB: Sep 30, 1933, 84 y.o.   MRN: [id number]  HPI L leg lesion- noted yesterday AM.  Painful to touch.  Pt walks through her yard daily to the mailbox.  Has otherwise been feeling well.  No fevers.     Review of Systems For ROS see HPI     Objective:   Physical Exam  Constitutional: She is oriented to person, place, and time. She appears well-developed and well-nourished. No distress.  HENT:  Head: Normocephalic and atraumatic.  Neurological: She is alert and oriented to person, place, and time.  Skin: Skin is warm and dry. There is erythema (redness surrounding an embedded dog tick on L posterior thigh, TTP).  Psychiatric: She has a normal mood and affect. Her behavior is normal.  Vitals reviewed.         Assessment & Plan:  Infected tick bite- new.  Pt has embedded tick in L posterior thigh.  Area was prepped with peroxide and tick was removed using forceps.  Tick was removed completely.  Given surrounding redness and induration, will start Doxy.  Reviewed supportive care and red flags that should prompt return.  Pt expressed understanding and is in agreement w/ plan.

## 2017-12-20 ENCOUNTER — Ambulatory Visit: Payer: Medicare Other | Admitting: Rheumatology

## 2017-12-24 ENCOUNTER — Encounter: Payer: Self-pay | Admitting: Cardiology

## 2017-12-31 NOTE — Progress Notes (Signed)
HPI: FU SVT. Nuclear study in August of 2013 showed an ejection fraction of 76% and normal perfusion. Holter 12/14 showed sinus with pacs, pvcs and rare couplet. Echo 4/15 showed normal LV function. Carotid dopplers 8/18 showed 1-39 bilateral stenosis. Since last seen, the patient has dyspnea with more extreme activities but not with routine activities. It is relieved with rest. It is not associated with chest pain. There is no orthopnea, PND or pedal edema. There is no syncope or palpitations. There is no exertional chest pain.   Current Outpatient Medications  Medication Sig Dispense Refill  . albuterol (PROAIR HFA) 108 (90 Base) MCG/ACT inhaler Inhale 2 puffs into the lungs every 4 (four) hours as needed for wheezing or shortness of breath. 1 Inhaler 3  . amLODipine (NORVASC) 2.5 MG tablet Take 1 tablet (2.5 mg total) by mouth 2 (two) times daily. (Patient taking differently: Take 2.5 mg by mouth daily. ) 180 tablet 3  . aspirin EC 81 MG tablet Take 81 mg by mouth daily.    Marland Kitchen atenolol (TENORMIN) 25 MG tablet Take 0.5 tablets (12.5 mg total) by mouth 2 (two) times daily. 90 tablet 3  . fexofenadine (ALLEGRA) 180 MG tablet Take 1 tablet (180 mg total) by mouth daily. (Patient taking differently: Take 180 mg by mouth as needed. ) 30 tablet 11  . levothyroxine (SYNTHROID, LEVOTHROID) 75 MCG tablet Take 1 tablet (75 mcg total) by mouth daily before breakfast. 90 tablet 3  . omeprazole (PRILOSEC) 10 MG capsule Take 1 capsule (10 mg total) by mouth daily. (Patient taking differently: Take 10 mg by mouth as needed. ) 90 capsule 0  . simvastatin (ZOCOR) 10 MG tablet Take 1 tablet (10 mg total) by mouth every evening. 90 tablet 3  . Spacer/Aero-Holding Chambers (BREATHERITE COLL SPACER ADULT) MISC Use with  Albuterol Inhaler. 1 each 0  . Tiotropium Bromide Monohydrate (SPIRIVA RESPIMAT) 2.5 MCG/ACT AERS Inhale 2 puffs into the lungs daily. 1 Inhaler 5  . VOLTAREN 1 % GEL as needed.   2   No  current facility-administered medications for this visit.      Past Medical History:  Diagnosis Date  . Arthritis    sed rate 10, RF, CCP pending  . Blood in stool   . Cervical cancer ([place])    s/p hysterectomy/oop  . DDD (degenerative disc disease), lumbosacral   . Depression    Due to husbands passing away 09/22/2011  . Diverticulosis of colon   . Environmental allergies   . GERD (gastroesophageal reflux disease)   . H pylori ulcer    not treated due to expense  10/2001  . Hand dermatitis   . HTN (hypertension)   . Hyperlipidemia   . Hypothyroidism    TSH 14.488 (11/2005)  . IBS (irritable bowel syndrome)   . Lactose intolerance   . Migraines    "get them very rarely now" (02/08/2012)  . Multiple pigmented nevi    last derm evaluation 01/2003  . OA (osteoarthritis)    multiple sites  . Other seborrheic keratosis   . Pain in joint, lower leg   . Pneumonia    "couple times in my lifetime" (02/08/2012)  . PONV (postoperative nausea and vomiting)    "and takes me a long time to come out under it" (02/08/2012)  . Posterior vitreous detachment 1996  . Postmenopausal    s/p hysterectomy for h/o cervical cancer 1982 (both ovaries taken at that time) now on hormonal replacement   .  Postmenopausal HRT (hormone replacement therapy)   . PPD positive    history +PPD 1984, no treatment, no abnormal CXR  . Shortness of breath    with ambulation  . Stroke [facility])    TIA- April 2016 last one  . Supraventricular tachycardia, paroxysmal ([place])   . Urinary frequency   . Vaginal pruritus     Past Surgical History:  Procedure Laterality Date  . ABDOMINAL HYSTERECTOMY  1982  . CARDIOVASCULAR STRESS TEST  12/07/11   Normal nuclear stress test  . CATARACT EXTRACTION W/ INTRAOCULAR LENS IMPLANT  ?1997   right  . COLONOSCOPY    . EYE SURGERY     cataract removal right eye  . TOTAL ABDOMINAL HYSTERECTOMY W/ BILATERAL SALPINGOOPHORECTOMY  1982  . TOTAL KNEE ARTHROPLASTY  02/08/2012    Procedure: TOTAL KNEE ARTHROPLASTY;  Surgeon: Hessie Dibble, MD;  Location: [place];  Service: Orthopedics;  Laterality: Left;    Social History   Socioeconomic History  . Marital status: Widowed    Spouse name: Not on file  . Number of children: 3  . Years of education: 12TH  . Highest education level: Not on file  Occupational History  . Occupation: retired    Comment: Academic librarian: RETIRED  Social Needs  . Financial resource strain: Not on file  . Food insecurity:    Worry: Not on file    Inability: Not on file  . Transportation needs:    Medical: Not on file    Non-medical: Not on file  Tobacco Use  . Smoking status: Former Smoker    Packs/day: 1.00    Years: 25.00    Pack years: 25.00    Types: Cigarettes    Last attempt to quit: 04/17/1980    Years since quitting: 37.7  . Smokeless tobacco: Never Used  . Tobacco comment: passive smoker, husband smoked  Substance and Sexual Activity  . Alcohol use: No  . Drug use: No  . Sexual activity: Never  Lifestyle  . Physical activity:    Days per week: Not on file    Minutes per session: Not on file  . Stress: Not on file  Relationships  . Social connections:    Talks on phone: Not on file    Gets together: Not on file    Attends religious service: Not on file    Active member of club or organization: Not on file    Attends meetings of clubs or organizations: Not on file    Relationship status: Not on file  . Intimate partner violence:    Fear of current or ex partner: Not on file    Emotionally abused: Not on file    Physically abused: Not on file    Forced sexual activity: Not on file  Other Topics Concern  . Not on file  Social History Narrative   Lives with son, [name] who has medical problems-they help each other. Her husband died 10/13/2022 after 6 years of marriage.  She has 2 other sons that live within 2 hrs from her. She does not drive-she uses [place] [facility] medical service. She used to work in Scientist, research (medical), retired.       Tobacco: 25 pack yr hx, quit 1985.    Family History  Problem Relation Age of Onset  . Breast cancer Mother 23  . Lung cancer Brother 44  . Cancer Brother        lung  . Heart failure Father  congestive  . Heart disease Father   . Epilepsy Son   . Kidney disease Son        tuberous sclerosis, both kidneys removed, has transplant  . Diabetes Maternal Uncle   . Diabetes Paternal Aunt   . Cancer Maternal Grandmother        breast  . Breast cancer Maternal Grandmother   . Heart disease Son        [place]  . COPD Son   . Rectal cancer Maternal Aunt   . Colon cancer Neg Hx   . Esophageal cancer Neg Hx   . Stomach cancer Neg Hx     ROS: fatigue but no fevers or chills, productive cough, hemoptysis, dysphasia, odynophagia, melena, hematochezia, dysuria, hematuria, rash, seizure activity, orthopnea, PND, pedal edema, claudication. Remaining systems are negative.  Physical Exam: Well-developed well-nourished in no acute distress.  Skin is warm and dry.  HEENT is normal.  Neck is supple.  Chest is clear to auscultation with normal expansion.  Cardiovascular exam is regular rate and rhythm.  Abdominal exam nontender or distended. No masses palpated. Extremities show no edema. neuro grossly intact  ECG-normal sinus rhythm, left axis deviation, right bundle branch block.  Personally reviewed  A/P  1 supraventricular tachycardia-patient continues to do well with no recent episodes of SVT.  Continue medical therapy with beta-blockade.  Note higher doses of beta-blocker caused fatigue previously.  2 hypertension-blood pressure is controlled.  Continue present medications.  3 hyperlipidemia-continue statin.  Followed by primary care.  4 carotid artery disease-mild on most recent carotid Dopplers.  Continue aspirin and statin.  Kirk Ruths, MD

## 2018-01-07 ENCOUNTER — Ambulatory Visit (INDEPENDENT_AMBULATORY_CARE_PROVIDER_SITE_OTHER): Payer: Medicare Other | Admitting: Cardiology

## 2018-01-07 ENCOUNTER — Encounter: Payer: Self-pay | Admitting: Cardiology

## 2018-01-07 VITALS — BP 130/68 | HR 63 | Ht 61.0 in | Wt 156.0 lb

## 2018-01-07 DIAGNOSIS — I10 Essential (primary) hypertension: Secondary | ICD-10-CM | POA: Diagnosis not present

## 2018-01-07 DIAGNOSIS — I471 Supraventricular tachycardia: Secondary | ICD-10-CM | POA: Diagnosis not present

## 2018-01-07 DIAGNOSIS — E7800 Pure hypercholesterolemia, unspecified: Secondary | ICD-10-CM

## 2018-01-07 DIAGNOSIS — I679 Cerebrovascular disease, unspecified: Secondary | ICD-10-CM | POA: Diagnosis not present

## 2018-01-07 NOTE — Patient Instructions (Signed)
Your physician wants you to follow-up in: ONE YEAR WITH DR CRENSHAW You will receive a reminder letter in the mail two months in advance. If you don't receive a letter, please call our office to schedule the follow-up appointment.   If you need a refill on your cardiac medications before your next appointment, please call your pharmacy.  

## 2018-01-25 ENCOUNTER — Ambulatory Visit (INDEPENDENT_AMBULATORY_CARE_PROVIDER_SITE_OTHER): Payer: Medicare Other

## 2018-01-25 DIAGNOSIS — Z23 Encounter for immunization: Secondary | ICD-10-CM

## 2018-01-29 ENCOUNTER — Encounter: Payer: Self-pay | Admitting: Family Medicine

## 2018-02-12 ENCOUNTER — Other Ambulatory Visit: Payer: Self-pay | Admitting: Cardiology

## 2018-02-12 DIAGNOSIS — E785 Hyperlipidemia, unspecified: Secondary | ICD-10-CM

## 2018-02-12 NOTE — Telephone Encounter (Signed)
°*  STAT* If patient is at the pharmacy, call can be transferred to refill team.   1. Which medications need to be refilled? (please list name of each medication and dose if known)   simvastatin (ZOCOR) 10 MG tablet  2. Which pharmacy/location (including street and city if local pharmacy) is medication to be sent to? [id number] - [place], [place] - [address]  3. Do they need a 30 day or 90 day supply? 90 days

## 2018-02-13 MED ORDER — SIMVASTATIN 10 MG PO TABS: 10.0000 mg | ORAL_TABLET | Freq: Every evening | ORAL | 3 refills | Status: DC

## 2018-03-06 ENCOUNTER — Ambulatory Visit: Payer: Medicare Other | Admitting: Family Medicine

## 2018-03-08 ENCOUNTER — Encounter: Payer: Self-pay | Admitting: Family Medicine

## 2018-03-08 ENCOUNTER — Ambulatory Visit (INDEPENDENT_AMBULATORY_CARE_PROVIDER_SITE_OTHER): Payer: Medicare Other | Admitting: Family Medicine

## 2018-03-08 VITALS — BP 143/79 | HR 68 | Temp 97.7°F | Resp 20 | Ht 61.0 in | Wt 158.2 lb

## 2018-03-08 DIAGNOSIS — E063 Autoimmune thyroiditis: Secondary | ICD-10-CM

## 2018-03-08 DIAGNOSIS — M7071 Other bursitis of hip, right hip: Secondary | ICD-10-CM | POA: Diagnosis not present

## 2018-03-08 DIAGNOSIS — I679 Cerebrovascular disease, unspecified: Secondary | ICD-10-CM

## 2018-03-08 DIAGNOSIS — R7303 Prediabetes: Secondary | ICD-10-CM

## 2018-03-08 DIAGNOSIS — G459 Transient cerebral ischemic attack, unspecified: Secondary | ICD-10-CM

## 2018-03-08 DIAGNOSIS — I10 Essential (primary) hypertension: Secondary | ICD-10-CM | POA: Diagnosis not present

## 2018-03-08 DIAGNOSIS — I471 Supraventricular tachycardia: Secondary | ICD-10-CM | POA: Diagnosis not present

## 2018-03-08 DIAGNOSIS — E038 Other specified hypothyroidism: Secondary | ICD-10-CM | POA: Diagnosis not present

## 2018-03-08 DIAGNOSIS — I729 Aneurysm of unspecified site: Secondary | ICD-10-CM

## 2018-03-08 DIAGNOSIS — E7800 Pure hypercholesterolemia, unspecified: Secondary | ICD-10-CM

## 2018-03-08 LAB — POCT GLYCOSYLATED HEMOGLOBIN (HGB A1C): HEMOGLOBIN A1C: 5.3 % (ref 4.0–5.6)

## 2018-03-08 MED ORDER — AMLODIPINE BESYLATE 2.5 MG PO TABS: 2.5000 mg | ORAL_TABLET | Freq: Two times a day (BID) | ORAL | 1 refills | Status: DC

## 2018-03-08 MED ORDER — METHYLPREDNISOLONE ACETATE 80 MG/ML IJ SUSP
80.0000 mg | Freq: Once | INTRAMUSCULAR | Status: AC
  Administered 2018-03-08: 80 mg via INTRAMUSCULAR

## 2018-03-08 MED ORDER — ATENOLOL 25 MG PO TABS: 12.5000 mg | ORAL_TABLET | Freq: Two times a day (BID) | ORAL | 1 refills | Status: DC

## 2018-03-08 NOTE — Patient Instructions (Signed)
It was great to see you today.  I have refilled your blood pressure medicines.   If hip continues to cause you discomfort we can send you to as specialist that can provide injections to the [place].    Hip Bursitis Hip bursitis is swelling of a fluid-filled sac (bursa) in your hip. This swelling (inflammation) can be painful. This condition may come and go over time. Follow these instructions at home: Medicines  Take over-the-counter and prescription medicines only as told by your doctor.  Do not drive or use heavy machinery while taking prescription pain medicine, or as told by your doctor.  If you were prescribed an antibiotic medicine, take it as told by your doctor. Do not stop taking the antibiotic even if you start to feel better. Activity  Return to your normal activities as told by your doctor. Ask your doctor what activities are safe for you.  Rest and protect your hip until you feel better. General instructions  Wear wraps that put pressure on your hip (compression wraps) only as told by your doctor.  Raise (elevate) your hip above the level of your heart as much as you can. To do this, try putting a pillow under your hips while you lie down. Stop if this causes pain.  Do not use your hip to support your body weight until your doctor says that you can.  Use crutches as told by your doctor.  Gently rub and stretch your injured area as often as is comfortable.  Keep all follow-up visits as told by your doctor. This is important. How is this prevented?  Exercise regularly, as told by your doctor.  Warm up and stretch before being active.  Cool down and stretch after being active.  Avoid activities that bother your hip or cause pain.  Avoid sitting down for long periods at a time. Contact a doctor if:  You have a fever.  You get new symptoms.  You have trouble walking.  You have trouble doing everyday activities.  You have pain that gets worse.  You have  pain that does not get better with medicine.  You get red skin on your hip area.  You get a feeling of warmth in your hip area. Get help right away if:  You cannot move your hip.  You have very bad pain. This information is not intended to replace advice given to you by your health care provider. Make sure you discuss any questions you have with your health care provider. Document Released: 05/06/2010 Document Revised: 09/09/2015 Document Reviewed: 11/03/2014 Elsevier Interactive Patient Education  [employer].

## 2018-03-08 NOTE — Progress Notes (Signed)
Amy Rodriguez , 1933-11-28, 84 y.o., female MRN: [id number] Patient Care Team    Relationship Specialty Notifications Start End  Ma Hillock, DO PCP - General Family Medicine  01/08/15   Shon Hough, MD Consulting Physician Ophthalmology  08/03/15   Collene Gobble, MD Consulting Physician Pulmonary Disease  08/03/15   Lelon Perla, MD Consulting Physician Cardiology  08/03/15   Garvin Fila, MD Consulting Physician Neurology  08/03/15   Inda Castle, MD (Inactive) Consulting Physician Gastroenterology  08/04/15   Philemon Kingdom, MD Consulting Physician Internal Medicine  08/07/16   Melrose Nakayama, MD Consulting Physician Orthopedic Surgery  08/07/16     Chief Complaint  Patient presents with  . Hypertension  . prediabetes    Subjective:   Hypertension/hyperlipidemia: Patient is doing well on current medication dose of amlodipine 2.5 mg daily and atenolol 12.5 mg twice a day. She has not taken her amlodipine yet today. Patient reports compliance  with daily baby aspirin and Zocor 10 mg daily. Patient does have a small MCA aneurysm, SVT, history of stroke/tia and is followed by cardiology.  Hypothyroid: Patient reports compliance with levothyroxine 75 g daily.   GERD: Patient reports she takes Prilosec 10 mg daily when needed only.   Hip bursitis:  She complains of hip bursitis that is worsening over the last year. She has osteoporosis in the LEFT hip/femur. She reports the rheumatologist felt her right hip discomfort was from bursitis.  She wonders if we can give and injection to calm down her pain. It is worse when laying on her hip.  Depression screen [facility] 2/9 08/14/2017 08/07/2016 08/03/2015 07/08/2014 12/05/2011  Decreased Interest 0 0 0 0 1  Down, Depressed, Hopeless 0 0 0 0 0  PHQ - 2 Score 0 0 0 0 1  Some recent data might be hidden    Allergies  Allergen Reactions  . Benzonatate Nausea And Vomiting    "tessalon pearl"  . Sulfonamide Derivatives  Itching and Rash  . Fosamax [Alendronate]   . Hctz [Hydrochlorothiazide] Other (See Comments)    Severe hyponatremia    Social History   Tobacco Use  . Smoking status: Former Smoker    Packs/day: 1.00    Years: 25.00    Pack years: 25.00    Types: Cigarettes    Last attempt to quit: 04/17/1980    Years since quitting: 37.9  . Smokeless tobacco: Never Used  . Tobacco comment: passive smoker, husband smoked  Substance Use Topics  . Alcohol use: No   Past Medical History:  Diagnosis Date  . Arthritis    sed rate 10, RF, CCP pending  . Blood in stool   . Cervical cancer ([place])    s/p hysterectomy/oop  . DDD (degenerative disc disease), lumbosacral   . Depression    Due to husbands passing away 09/22/2011  . Diverticulosis of colon   . Environmental allergies   . GERD (gastroesophageal reflux disease)   . H pylori ulcer    not treated due to expense  10/2001  . Hand dermatitis   . HTN (hypertension)   . Hyperlipidemia   . Hypothyroidism    TSH 14.488 (11/2005)  . IBS (irritable bowel syndrome)   . Lactose intolerance   . Migraines    "get them very rarely now" (02/08/2012)  . Multiple pigmented nevi    last derm evaluation 01/2003  . OA (osteoarthritis)    multiple sites  . Other seborrheic keratosis   .  Pain in joint, lower leg   . Pneumonia    "couple times in my lifetime" (02/08/2012)  . PONV (postoperative nausea and vomiting)    "and takes me a long time to come out under it" (02/08/2012)  . Posterior vitreous detachment 1996  . Postmenopausal    s/p hysterectomy for h/o cervical cancer 1982 (both ovaries taken at that time) now on hormonal replacement   . Postmenopausal HRT (hormone replacement therapy)   . PPD positive    history +PPD 1984, no treatment, no abnormal CXR  . Shortness of breath    with ambulation  . Stroke [facility])    TIA- April 2016 last one  . Supraventricular tachycardia, paroxysmal ([place])   . Urinary frequency   . Vaginal pruritus    Past  Surgical History:  Procedure Laterality Date  . ABDOMINAL HYSTERECTOMY  1982  . CARDIOVASCULAR STRESS TEST  12/07/11   Normal nuclear stress test  . CATARACT EXTRACTION W/ INTRAOCULAR LENS IMPLANT  ?1997   right  . COLONOSCOPY    . EYE SURGERY     cataract removal right eye  . TOTAL ABDOMINAL HYSTERECTOMY W/ BILATERAL SALPINGOOPHORECTOMY  1982  . TOTAL KNEE ARTHROPLASTY  02/08/2012   Procedure: TOTAL KNEE ARTHROPLASTY;  Surgeon: Hessie Dibble, MD;  Location: [place];  Service: Orthopedics;  Laterality: Left;   Family History  Problem Relation Age of Onset  . Breast cancer Mother 34  . Lung cancer Brother 72  . Cancer Brother        lung  . Heart failure Father        congestive  . Heart disease Father   . Epilepsy Son   . Kidney disease Son        tuberous sclerosis, both kidneys removed, has transplant  . Diabetes Maternal Uncle   . Diabetes Paternal Aunt   . Cancer Maternal Grandmother        breast  . Breast cancer Maternal Grandmother   . Heart disease Son        [place] white  . COPD Son   . Rectal cancer Maternal Aunt   . Colon cancer Neg Hx   . Esophageal cancer Neg Hx   . Stomach cancer Neg Hx    Allergies as of 03/08/2018      Reactions   Benzonatate Nausea And Vomiting   "tessalon pearl"   Sulfonamide Derivatives Itching, Rash   Fosamax [alendronate]    Hctz [hydrochlorothiazide] Other (See Comments)   Severe hyponatremia       Medication List        Accurate as of 03/08/18 11:59 PM. Always use your most recent med list.          albuterol 108 (90 Base) MCG/ACT inhaler Commonly known as:  PROVENTIL HFA;VENTOLIN HFA Inhale 2 puffs into the lungs every 4 (four) hours as needed for wheezing or shortness of breath.   amLODipine 2.5 MG tablet Commonly known as:  NORVASC Take 1 tablet (2.5 mg total) by mouth 2 (two) times daily.   aspirin EC 81 MG tablet Take 81 mg by mouth daily.   atenolol 25 MG tablet Commonly known as:  TENORMIN Take 0.5  tablets (12.5 mg total) by mouth 2 (two) times daily.   BREATHERITE COLL SPACER ADULT Misc Use with  Albuterol Inhaler.   fexofenadine 180 MG tablet Commonly known as:  ALLEGRA Take 1 tablet (180 mg total) by mouth daily.   levothyroxine 75 MCG tablet Commonly known as:  SYNTHROID, LEVOTHROID Take 1 tablet (75 mcg total) by mouth daily before breakfast.   omeprazole 10 MG capsule Commonly known as:  PRILOSEC Take 1 capsule (10 mg total) by mouth daily.   simvastatin 10 MG tablet Commonly known as:  ZOCOR Take 1 tablet (10 mg total) by mouth every evening.   VOLTAREN 1 % Gel Generic drug:  diclofenac sodium as needed.       No results found for this or any previous visit (from the past 24 hour(s)). No results found.   ROS: Negative, with the exception of above mentioned in HPI   Objective:  BP (!) 143/79 (BP Location: Right Arm, Patient Position: Sitting, Cuff Size: Normal)   Pulse 68   Temp 97.7 F (36.5 C)   Resp 20   Ht 5\' 1"  (1.549 m)   Wt 158 lb 4 oz (71.8 kg)   SpO2 96%   BMI 29.90 kg/m  Body mass index is 29.9 kg/m. Gen: Afebrile. No acute distress. Nontoxic. Overweight caucasian female.  HENT: AT. [place].  MMM.  Eyes:Pupils Equal Round Reactive to light, Extraocular movements intact,  Conjunctiva without redness, discharge or icterus. Neck/lymp/endocrine: Supple,no lymphadenopathy, no thyromegaly CV: RRR no,Murmur no edema, +2/4 P posterior tibialis pulses Chest: CTAB, no wheeze or crackles MSK: right brusa hip pain TTP right lateral hip.  Neuro:  Normal gait. PERLA. EOMi. Alert. Oriented x3  Assessment/Plan: Amy Rodriguez is a 84 y.o. female present for OV for follow-up on chronic medical conditions. SVT/TIA history/small MCA aneurysm/hypertension/hyperlipidemia/CVD: Blood pressure mildly above goal today.  - encourage her to take the amlodipine as directed. She has not had her dose yet today.  - Continue 81 mg ASA.  - Continue simvastatin 10 mg  QD  hypothyroidism tsh normal 08/2017. Continue to follow yearly chronic medical conditions appointments. - Continue levothyroxine 75 g daily  Prediabetes: A1c 5.3 today, keep up the good work, dietary modifications with low-carb and exercise. Follow yearly.   GERD: Refills on Prilosec 10 mg daily PRN f/u 6 months on chronic issues  Right hip bursitis:  - offered her ortho or SM referral - she declined for now.  - she was agreeable to IM depo medrol injection to see if it can provide her Short term relief.    Reviewed expectations re: course of current medical issues.  Discussed self-management of symptoms.  Outlined signs and symptoms indicating need for more acute intervention.  Patient verbalized understanding and all questions were answered.  Patient received an After-Visit Summary.    Orders Placed This Encounter  Procedures  . POCT glycosylated hemoglobin (Hb A1C)    electronically signed by:  Howard Pouch, DO  [place]

## 2018-04-15 ENCOUNTER — Encounter: Payer: Self-pay | Admitting: Family Medicine

## 2018-08-20 ENCOUNTER — Ambulatory Visit: Payer: Medicare Other

## 2018-09-06 ENCOUNTER — Ambulatory Visit: Payer: Medicare Other | Admitting: Family Medicine

## 2018-09-25 ENCOUNTER — Telehealth: Payer: Self-pay

## 2018-09-25 NOTE — Telephone Encounter (Signed)
Spoke to patient - COVID screening questions completed

## 2018-09-25 NOTE — Telephone Encounter (Signed)
Copied from [place] [id number]. Topic: General - Other >> Sep 25, 2018  1:44 PM Alanda Slim E wrote: Reason for CRM: Pt returned [place] phone call/ office not available after several tries

## 2018-09-27 ENCOUNTER — Encounter: Payer: Self-pay | Admitting: Family Medicine

## 2018-09-27 ENCOUNTER — Telehealth: Payer: Self-pay | Admitting: Family Medicine

## 2018-09-27 ENCOUNTER — Other Ambulatory Visit: Payer: Self-pay

## 2018-09-27 ENCOUNTER — Ambulatory Visit (INDEPENDENT_AMBULATORY_CARE_PROVIDER_SITE_OTHER): Payer: Medicare Other | Admitting: Family Medicine

## 2018-09-27 VITALS — BP 135/71 | HR 59 | Temp 97.8°F | Resp 18 | Ht 61.0 in | Wt 154.4 lb

## 2018-09-27 DIAGNOSIS — I10 Essential (primary) hypertension: Secondary | ICD-10-CM | POA: Diagnosis not present

## 2018-09-27 DIAGNOSIS — E063 Autoimmune thyroiditis: Secondary | ICD-10-CM | POA: Diagnosis not present

## 2018-09-27 DIAGNOSIS — E038 Other specified hypothyroidism: Secondary | ICD-10-CM

## 2018-09-27 DIAGNOSIS — I729 Aneurysm of unspecified site: Secondary | ICD-10-CM

## 2018-09-27 DIAGNOSIS — R7303 Prediabetes: Secondary | ICD-10-CM

## 2018-09-27 DIAGNOSIS — M7061 Trochanteric bursitis, right hip: Secondary | ICD-10-CM

## 2018-09-27 DIAGNOSIS — I679 Cerebrovascular disease, unspecified: Secondary | ICD-10-CM

## 2018-09-27 DIAGNOSIS — I471 Supraventricular tachycardia: Secondary | ICD-10-CM | POA: Diagnosis not present

## 2018-09-27 DIAGNOSIS — G459 Transient cerebral ischemic attack, unspecified: Secondary | ICD-10-CM

## 2018-09-27 DIAGNOSIS — E7800 Pure hypercholesterolemia, unspecified: Secondary | ICD-10-CM

## 2018-09-27 DIAGNOSIS — E663 Overweight: Secondary | ICD-10-CM | POA: Insufficient documentation

## 2018-09-27 DIAGNOSIS — M170 Bilateral primary osteoarthritis of knee: Secondary | ICD-10-CM

## 2018-09-27 LAB — COMPREHENSIVE METABOLIC PANEL
ALT: 9 U/L (ref 0–35)
AST: 14 U/L (ref 0–37)
Albumin: 4.4 g/dL (ref 3.5–5.2)
Alkaline Phosphatase: 69 U/L (ref 39–117)
BUN: 11 mg/dL (ref 6–23)
CO2: 28 mEq/L (ref 19–32)
Calcium: 8.9 mg/dL (ref 8.4–10.5)
Chloride: 99 mEq/L (ref 96–112)
Creatinine, Ser: 0.91 mg/dL (ref 0.40–1.20)
GFR: 59.06 mL/min — ABNORMAL LOW (ref >60.00)
Glucose, Bld: 94 mg/dL (ref 70–99)
Potassium: 4.5 mEq/L (ref 3.5–5.1)
Sodium: 135 mEq/L (ref 135–145)
Total Bilirubin: 0.5 mg/dL (ref 0.2–1.2)
Total Protein: 7.1 g/dL (ref 6.0–8.3)

## 2018-09-27 LAB — CBC WITH DIFFERENTIAL/PLATELET
Basophils Absolute: 0 10*3/uL (ref 0.0–0.1)
Basophils Relative: 0.6 % (ref 0.0–3.0)
Eosinophils Absolute: 0.3 10*3/uL (ref 0.0–0.7)
Eosinophils Relative: 5.3 % — ABNORMAL HIGH (ref 0.0–5.0)
HCT: 39.4 % (ref 36.0–46.0)
Hemoglobin: 13.6 g/dL (ref 12.0–15.0)
Lymphocytes Relative: 31.4 % (ref 12.0–46.0)
Lymphs Abs: 1.7 10*3/uL (ref 0.7–4.0)
MCHC: 34.4 g/dL (ref 30.0–36.0)
MCV: 83.2 fl (ref 78.0–100.0)
Monocytes Absolute: 0.8 10*3/uL (ref 0.1–1.0)
Monocytes Relative: 14.8 % — ABNORMAL HIGH (ref 3.0–12.0)
Neutro Abs: 2.6 10*3/uL (ref 1.4–7.7)
Neutrophils Relative %: 47.9 % (ref 43.0–77.0)
Platelets: 208 10*3/uL (ref 150.0–400.0)
RBC: 4.73 Mil/uL (ref 3.87–5.11)
RDW: 13.2 % (ref 11.5–15.5)
WBC: 5.4 10*3/uL (ref 4.0–10.5)

## 2018-09-27 LAB — TSH: TSH: 1.93 u[IU]/mL (ref 0.35–4.50)

## 2018-09-27 LAB — LIPID PANEL
Cholesterol: 116 mg/dL (ref 0–200)
HDL: 44.1 mg/dL (ref >39.00)
LDL Cholesterol: 51 mg/dL (ref 0–99)
NonHDL: 71.4
Total CHOL/HDL Ratio: 3
Triglycerides: 101 mg/dL (ref 0.0–149.0)
VLDL: 20.2 mg/dL (ref 0.0–40.0)

## 2018-09-27 LAB — HEMOGLOBIN A1C: Hgb A1c MFr Bld: 6 % (ref 4.6–6.5)

## 2018-09-27 MED ORDER — ATENOLOL 25 MG PO TABS: 12.5000 mg | ORAL_TABLET | Freq: Two times a day (BID) | ORAL | 1 refills | Status: DC

## 2018-09-27 MED ORDER — OMEPRAZOLE 10 MG PO CPDR: 10.0000 mg | DELAYED_RELEASE_CAPSULE | ORAL | 3 refills | Status: DC | PRN

## 2018-09-27 MED ORDER — LEVOTHYROXINE SODIUM 75 MCG PO TABS: 75.0000 ug | ORAL_TABLET | Freq: Every day | ORAL | 3 refills | Status: DC

## 2018-09-27 MED ORDER — DICLOFENAC SODIUM 1 % TD GEL: 2.0000 g | TRANSDERMAL | 11 refills | Status: AC | PRN

## 2018-09-27 MED ORDER — AMLODIPINE BESYLATE 2.5 MG PO TABS: ORAL_TABLET | ORAL | 1 refills | Status: DC

## 2018-09-27 NOTE — Telephone Encounter (Signed)
Please inform patient the following information: Her labs are stable, with the exception her a1c increased back to 6.0 (last 5.3). Her glucose was good though. Continue to watch sugar/carb content in her diet.    I refilled her thyroid medication and sent the lab to her endocrine also.   Please schedule her for 5.5 mo follow up.

## 2018-09-27 NOTE — Progress Notes (Signed)
Amy Rodriguez , 05-27-1933, 85 y.o., female MRN: [id number] Patient Care Team    Relationship Specialty Notifications Start End  Ma Hillock, DO PCP - General Family Medicine  01/08/15   Shon Hough, MD Consulting Physician Ophthalmology  08/03/15   Collene Gobble, MD Consulting Physician Pulmonary Disease  08/03/15   Lelon Perla, MD Consulting Physician Cardiology  08/03/15   Garvin Fila, MD Consulting Physician Neurology  08/03/15   Inda Castle, MD (Inactive) Consulting Physician Gastroenterology  08/04/15   Philemon Kingdom, MD Consulting Physician Internal Medicine  08/07/16   Melrose Nakayama, MD Consulting Physician Orthopedic Surgery  08/07/16     Chief Complaint  Patient presents with  . Hypertension    Pt takes BP at home daily. Pt has not taken medications yet, she takes it in the afternoon. Pt is doing well today but complains of haivng chronic sinus pain behind eyes. Pt was having constant nose bleeds for 1 week.  Pt needs refills on medications, including voltaren gel     Subjective:   Hypertension/hyperlipidemia: Patient is doing well on current medication dose of amlodipine 2.5 mg twice daily and atenolol 12.5 mg twice a day. She has not taken her amlodipine yet today. Patient reports compliance  with daily baby aspirin and Zocor 10 mg daily. Patient does have a small MCA aneurysm, SVT, history of stroke/tia and is followed by cardiology. Patient denies chest pain, shortness of breath, dizziness or lower extremity edema.  CMP: 09/11/2017 within normal limits CBC: 09/11/2017 within normal limits Lipids: 09/11/2017 within normal limits TSH: 09/11/2017 within normal limits  Elevated A1c/overwight: 5.9>>> 5.3 last visit.  She has not been as active secondary to coronavirus pandemic. Body mass index is 29.17 kg/m.   Hypothyroid: Patient reports compliance with levothyroxine 75 g daily.  She is followed by endocrinology for this condition.  GERD: Patient  reports she takes Prilosec 10 mg as needed.  Knee arthritis/Hip bursitis:  She complains of hip bursitis that is worsening over the last 1.5 years. She has osteoporosis in the LEFT hip/femur. She reports the rheumatologist felt her right hip discomfort was from bursitis.  Is worse with laying on her hip.  She had received an injection of steroid about 6 months ago and did not see benefit.  Depression screen [facility] 2/9 09/27/2018 08/14/2017 08/07/2016 08/03/2015 07/08/2014  Decreased Interest 0 0 0 0 0  Down, Depressed, Hopeless 0 0 0 0 0  PHQ - 2 Score 0 0 0 0 0  Some recent data might be hidden    Allergies  Allergen Reactions  . Benzonatate Nausea And Vomiting    "tessalon pearl"  . Sulfonamide Derivatives Itching and Rash  . Fosamax [Alendronate]   . Hctz [Hydrochlorothiazide] Other (See Comments)    Severe hyponatremia    Social History   Tobacco Use  . Smoking status: Former Smoker    Packs/day: 1.00    Years: 25.00    Pack years: 25.00    Types: Cigarettes    Quit date: 04/17/1980    Years since quitting: 38.4  . Smokeless tobacco: Never Used  . Tobacco comment: passive smoker, husband smoked  Substance Use Topics  . Alcohol use: No   Past Medical History:  Diagnosis Date  . Arthritis    sed rate 10, RF, CCP pending  . Blood in stool   . Cervical cancer ([place])    s/p hysterectomy/oop  . DDD (degenerative disc disease), lumbosacral   .  Depression    Due to husbands passing away 09/22/2011  . Diverticulosis of colon   . Environmental allergies   . GERD (gastroesophageal reflux disease)   . H pylori ulcer    not treated due to expense  10/2001  . Hand dermatitis   . HTN (hypertension)   . Hyperlipidemia   . Hypothyroidism    TSH 14.488 (11/2005)  . IBS (irritable bowel syndrome)   . Lactose intolerance   . Migraines    "get them very rarely now" (02/08/2012)  . Multiple pigmented nevi    last derm evaluation 01/2003  . OA (osteoarthritis)    multiple sites  . Other  seborrheic keratosis   . Pain in joint, lower leg   . Pneumonia    "couple times in my lifetime" (02/08/2012)  . PONV (postoperative nausea and vomiting)    "and takes me a long time to come out under it" (02/08/2012)  . Posterior vitreous detachment 1996  . Postmenopausal    s/p hysterectomy for h/o cervical cancer 1982 (both ovaries taken at that time) now on hormonal replacement   . Postmenopausal HRT (hormone replacement therapy)   . PPD positive    history +PPD 1984, no treatment, no abnormal CXR  . Shortness of breath    with ambulation  . Stroke [facility])    TIA- April 2016 last one  . Supraventricular tachycardia, paroxysmal ([place])   . Urinary frequency   . Vaginal pruritus    Past Surgical History:  Procedure Laterality Date  . ABDOMINAL HYSTERECTOMY  1982  . CARDIOVASCULAR STRESS TEST  12/07/11   Normal nuclear stress test  . CATARACT EXTRACTION W/ INTRAOCULAR LENS IMPLANT  ?1997   right  . COLONOSCOPY    . EYE SURGERY     cataract removal right eye  . TOTAL ABDOMINAL HYSTERECTOMY W/ BILATERAL SALPINGOOPHORECTOMY  1982  . TOTAL KNEE ARTHROPLASTY  02/08/2012   Procedure: TOTAL KNEE ARTHROPLASTY;  Surgeon: Hessie Dibble, MD;  Location: [place];  Service: Orthopedics;  Laterality: Left;   Family History  Problem Relation Age of Onset  . Breast cancer Mother 76  . Lung cancer Brother 27  . Cancer Brother        lung  . Heart failure Father        congestive  . Heart disease Father   . Epilepsy Son   . Kidney disease Son        tuberous sclerosis, both kidneys removed, has transplant  . Diabetes Maternal Uncle   . Diabetes Paternal Aunt   . Cancer Maternal Grandmother        breast  . Breast cancer Maternal Grandmother   . Heart disease Son        [place] white  . COPD Son   . Rectal cancer Maternal Aunt   . Colon cancer Neg Hx   . Esophageal cancer Neg Hx   . Stomach cancer Neg Hx    Allergies as of 09/27/2018      Reactions   Benzonatate Nausea And  Vomiting   "tessalon pearl"   Sulfonamide Derivatives Itching, Rash   Fosamax [alendronate]    Hctz [hydrochlorothiazide] Other (See Comments)   Severe hyponatremia       Medication List       Accurate as of September 27, 2018  5:09 PM. If you have any questions, ask your nurse or doctor.        albuterol 108 (90 Base) MCG/ACT inhaler Commonly known as: ProAir  HFA Inhale 2 puffs into the lungs every 4 (four) hours as needed for wheezing or shortness of breath.   amLODipine 2.5 MG tablet Commonly known as: NORVASC 5 mg (2 tabs)  in the am and 2.5 mg ( 1 tab) in the evening. What changed:   how much to take  how to take this  when to take this  additional instructions Changed by: Howard Pouch, DO   aspirin EC 81 MG tablet Take 81 mg by mouth daily.   atenolol 25 MG tablet Commonly known as: TENORMIN Take 0.5 tablets (12.5 mg total) by mouth 2 (two) times daily.   BreatheRite Coll Spacer Adult Misc Use with  Albuterol Inhaler.   diclofenac sodium 1 % Gel Commonly known as: Voltaren Apply 2 g topically as needed. What changed:   how much to take  how to take this Changed by: Howard Pouch, DO   fexofenadine 180 MG tablet Commonly known as: ALLEGRA Take 1 tablet (180 mg total) by mouth daily. What changed:   when to take this  reasons to take this   levothyroxine 75 MCG tablet Commonly known as: SYNTHROID Take 1 tablet (75 mcg total) by mouth daily before breakfast.   omeprazole 10 MG capsule Commonly known as: PRILOSEC Take 1 capsule (10 mg total) by mouth as needed.   simvastatin 10 MG tablet Commonly known as: ZOCOR Take 1 tablet (10 mg total) by mouth every evening.       Results for orders placed or performed in visit on 09/27/18 (from the past 24 hour(s))  CBC w/Diff     Status: Abnormal   Collection Time: 09/27/18  1:40 PM  Result Value Ref Range   WBC 5.4 4.0 - 10.5 K/uL   RBC 4.73 3.87 - 5.11 Mil/uL   Hemoglobin 13.6 12.0 - 15.0 g/dL    HCT 39.4 36.0 - 46.0 %   MCV 83.2 78.0 - 100.0 fl   MCHC 34.4 30.0 - 36.0 g/dL   RDW 13.2 11.5 - 15.5 %   Platelets 208.0 150.0 - 400.0 K/uL   Neutrophils Relative % 47.9 43.0 - 77.0 %   Lymphocytes Relative 31.4 12.0 - 46.0 %   Monocytes Relative 14.8 (H) 3.0 - 12.0 %   Eosinophils Relative 5.3 (H) 0.0 - 5.0 %   Basophils Relative 0.6 0.0 - 3.0 %   Neutro Abs 2.6 1.4 - 7.7 K/uL   Lymphs Abs 1.7 0.7 - 4.0 K/uL   Monocytes Absolute 0.8 0.1 - 1.0 K/uL   Eosinophils Absolute 0.3 0.0 - 0.7 K/uL   Basophils Absolute 0.0 0.0 - 0.1 K/uL  Comp Met (CMET)     Status: Abnormal   Collection Time: 09/27/18  1:40 PM  Result Value Ref Range   Sodium 135 135 - 145 mEq/L   Potassium 4.5 3.5 - 5.1 mEq/L   Chloride 99 96 - 112 mEq/L   CO2 28 19 - 32 mEq/L   Glucose, Bld 94 70 - 99 mg/dL   BUN 11 6 - 23 mg/dL   Creatinine, Ser 0.91 0.40 - 1.20 mg/dL   Total Bilirubin 0.5 0.2 - 1.2 mg/dL   Alkaline Phosphatase 69 39 - 117 U/L   AST 14 0 - 37 U/L   ALT 9 0 - 35 U/L   Total Protein 7.1 6.0 - 8.3 g/dL   Albumin 4.4 3.5 - 5.2 g/dL   Calcium 8.9 8.4 - 10.5 mg/dL   GFR 59.06 (L) >60.00 mL/min  Lipid panel  Status: None   Collection Time: 09/27/18  1:40 PM  Result Value Ref Range   Cholesterol 116 0 - 200 mg/dL   Triglycerides 101.0 0.0 - 149.0 mg/dL   HDL 44.10 >39.00 mg/dL   VLDL 20.2 0.0 - 40.0 mg/dL   LDL Cholesterol 51 0 - 99 mg/dL   Total CHOL/HDL Ratio 3    NonHDL 71.40   TSH     Status: None   Collection Time: 09/27/18  1:40 PM  Result Value Ref Range   TSH 1.93 0.35 - 4.50 uIU/mL  Hemoglobin A1c     Status: None   Collection Time: 09/27/18  1:40 PM  Result Value Ref Range   Hgb A1c MFr Bld 6.0 4.6 - 6.5 %   No results found.   ROS: Negative, with the exception of above mentioned in HPI   Objective:  BP 135/71 (BP Location: Left Arm, Patient Position: Sitting, Cuff Size: Normal)   Pulse (!) 59   Temp 97.8 F (36.6 C) (Temporal)   Resp 18   Ht '5\' 1"'  (1.549 m)   Wt 154  lb 6 oz (70 kg)   SpO2 94%   BMI 29.17 kg/m  Body mass index is 29.17 kg/m. Gen: Afebrile. No acute distress.  Nontoxic in presentation, well-developed, well-nourished, Caucasian female.  Over weight. HENT: AT. [place].  MMM.  Eyes:Pupils Equal Round Reactive to light, Extraocular movements intact,  Conjunctiva without redness, discharge or icterus. Neck/lymp/endocrine: Supple, no lymphadenopathy, no thyromegaly CV: RRR no murmur, no edema, +2/4 P posterior tibialis pulses Chest: CTAB, no wheeze or crackles Abd: Soft. NTND. BS present.  No masses palpated.  MSK: Tender to palpation right lateral hip Skin: No rashes, purpura or petechiae.  Neuro:  PERLA. EOMi. Alert. Oriented x3 Psych: Normal affect, dress and demeanor. Normal speech. Normal thought content and judgment..    Assessment/Plan: Amy Rodriguez is a 85 y.o. female present for OV for follow-up on chronic medical conditions. SVT/TIA history/small MCA aneurysm/hypertension/hyperlipidemia/CVD: -Again discussed her blood pressure is mildly above goal today.  She reports most of her blood pressures are quite consistent with the readings obtained today.  Goal below 130/80 -Continue atenolol 12.5 mg twice daily -Increase amlodipine to 5 mg in the morning and 2.5 mg in the evening.  - Continue 81 mg ASA.  - Continue simvastatin 10 mg QD -CBC, CMP, lipid panel and TSH collected today. Follow-up 6 months.  Medications refilled today.  hypothyroidism tsh normal 08/2017. Continue to follow yearly chronic medical conditions appointments. - Continue levothyroxine 75 g daily-TSH collected today.  Will forward results to her endocrinologist.  Prediabetes/overweight: A1c 5.3 last visit 6 months ago.  Prior visit 5.9.  A1c collected today. Routine exercise and dietary modifications encouraged.  GERD:  Stable.  Refills on Prilosec 10 mg daily PRN f/u 6 months on chronic issues  Right hip bursitis/knee osteoarthritis:  -She is now  agreeable to orthopedic referral.  Place referral back to [place] for her hip bursitis. -Voltaren gel prescribed for her knee arthritis.   Reviewed expectations re: course of current medical issues.  Discussed self-management of symptoms.  Outlined signs and symptoms indicating need for more acute intervention.  Patient verbalized understanding and all questions were answered.  Patient received an After-Visit Summary.    Orders Placed This Encounter  Procedures  . CBC w/Diff  . Comp Met (CMET)  . Lipid panel  . TSH  . Hemoglobin A1c  . Ambulatory referral to Orthopedic Surgery   >  25 minutes spent with patient, >50% of time spent face to face    electronically signed by:  Howard Pouch, DO  [place]

## 2018-09-27 NOTE — Patient Instructions (Addendum)
Great to see you today.   We will call you with lab results. As long as everything comes back normal we will see you in 6 mos.   Increase your amlodipine to 2 tabs in the morning and 1 tab in the evening.   I have referred you to your ortho for hip bursitis.     Hip Bursitis  Hip bursitis is swelling of a fluid-filled sac (bursa) in your hip. This swelling (inflammation) can be painful. This condition may come and go over time. Follow these instructions at home: Medicines  Take over-the-counter and prescription medicines only as told by your doctor.  Do not drive or use heavy machinery while taking prescription pain medicine, or as told by your doctor.  If you were prescribed an antibiotic medicine, take it as told by your doctor. Do not stop taking the antibiotic even if you start to feel better. Activity  Return to your normal activities as told by your doctor. Ask your doctor what activities are safe for you.  Rest and protect your hip until you feel better. General instructions  Wear wraps that put pressure on your hip (compression wraps) only as told by your doctor.  Raise (elevate) your hip above the level of your heart as much as you can. To do this, try putting a pillow under your hips while you lie down. Stop if this causes pain.  Do not use your hip to support your body weight until your doctor says that you can.  Use crutches as told by your doctor.  Gently rub and stretch your injured area as often as is comfortable.  Keep all follow-up visits as told by your doctor. This is important. How is this prevented?  Exercise regularly, as told by your doctor.  Warm up and stretch before being active.  Cool down and stretch after being active.  Avoid activities that bother your hip or cause pain.  Avoid sitting down for long periods at a time. Contact a doctor if:  You have a fever.  You get new symptoms.  You have trouble walking.  You have trouble  doing everyday activities.  You have pain that gets worse.  You have pain that does not get better with medicine.  You get red skin on your hip area.  You get a feeling of warmth in your hip area. Get help right away if:  You cannot move your hip.  You have very bad pain. This information is not intended to replace advice given to you by your health care provider. Make sure you discuss any questions you have with your health care provider. Document Released: 05/06/2010 Document Revised: 09/09/2015 Document Reviewed: 11/03/2014 Elsevier Interactive Patient Education  2019 [employer].

## 2018-09-30 NOTE — Telephone Encounter (Signed)
Pt was called and message was left to check my chart for lab results/instructions. My chart message was sent.

## 2018-09-30 NOTE — Telephone Encounter (Signed)
Pt read mychart message.

## 2018-10-02 ENCOUNTER — Encounter: Payer: Self-pay | Admitting: Family Medicine

## 2018-10-14 DIAGNOSIS — M7061 Trochanteric bursitis, right hip: Secondary | ICD-10-CM | POA: Diagnosis not present

## 2018-11-07 ENCOUNTER — Ambulatory Visit: Payer: Medicare Other | Admitting: Internal Medicine

## 2019-01-07 ENCOUNTER — Ambulatory Visit (INDEPENDENT_AMBULATORY_CARE_PROVIDER_SITE_OTHER): Payer: Medicare Other | Admitting: General Practice

## 2019-01-07 ENCOUNTER — Encounter: Payer: Self-pay | Admitting: Cardiology

## 2019-01-07 ENCOUNTER — Other Ambulatory Visit: Payer: Self-pay

## 2019-01-07 VITALS — BP 120/62 | HR 51 | Temp 97.2°F | Ht 61.0 in | Wt 159.0 lb

## 2019-01-07 DIAGNOSIS — I471 Supraventricular tachycardia: Secondary | ICD-10-CM | POA: Diagnosis not present

## 2019-01-07 DIAGNOSIS — E7800 Pure hypercholesterolemia, unspecified: Secondary | ICD-10-CM | POA: Diagnosis not present

## 2019-01-07 DIAGNOSIS — I6523 Occlusion and stenosis of bilateral carotid arteries: Secondary | ICD-10-CM | POA: Diagnosis not present

## 2019-01-07 DIAGNOSIS — I10 Essential (primary) hypertension: Secondary | ICD-10-CM

## 2019-01-07 MED ORDER — ATENOLOL 25 MG PO TABS: 12.5000 mg | ORAL_TABLET | Freq: Two times a day (BID) | ORAL | 1 refills | Status: DC | PRN

## 2019-01-07 NOTE — Patient Instructions (Addendum)
Medication Instructions: DECREASE Atenolol to 12.5mg  Take 1 tablet twice a day. Take tablet on a as needed basis If you need a refill on your cardiac medications before your next appointment, please call your pharmacy.   Lab work: None  If you have labs (blood work) drawn today and your tests are completely normal, you will receive your results only by: Marland Kitchen MyChart Message (if you have MyChart) OR . A paper copy in the mail If you have any lab test that is abnormal or we need to change your treatment, we will call you to review the results.  Testing/Procedures: None   Follow-Up: At [facility], you and your health needs are our priority.  As part of our continuing mission to provide you with exceptional heart care, we have created designated Provider Care Teams.  These Care Teams include your primary Cardiologist (physician) and Advanced Practice Providers (APPs -  Physician Assistants and Nurse Practitioners) who all work together to provide you with the care you need, when you need it.  . Your physician recommends that you schedule a follow-up appointment in: 3-4 weeks with Coletta Memos, NP  Any Other Special Instructions Will Be Listed Below (If Applicable).

## 2019-01-07 NOTE — Progress Notes (Signed)
Cardiology Clinic Note   Patient Name: Amy Rodriguez Date of Encounter: 01/07/2019  Primary Care Provider:  Ma Hillock, Rodriguez Primary Cardiologist:  No primary care provider on file.  Patient Profile    Amy Rodriguez 85 year old female presents today for follow-up of her hypertension, tachycardia, and hyperlipidemia.  Past Medical History    Past Medical History:  Diagnosis Date  . Arthritis    sed rate 10, RF, CCP pending  . Blood in stool   . Cervical cancer ([place])    s/p hysterectomy/oop  . DDD (degenerative disc disease), lumbosacral   . Depression    Due to husbands passing away 09/22/2011  . Diverticulosis of colon   . Environmental allergies   . GERD (gastroesophageal reflux disease)   . H pylori ulcer    not treated due to expense  10/2001  . Hand dermatitis   . HTN (hypertension)   . Hyperlipidemia   . Hypothyroidism    TSH 14.488 (11/2005)  . IBS (irritable bowel syndrome)   . Lactose intolerance   . Migraines    "get them very rarely now" (02/08/2012)  . Multiple pigmented nevi    last derm evaluation 01/2003  . OA (osteoarthritis)    multiple sites  . Other seborrheic keratosis   . Pain in joint, lower leg   . Pneumonia    "couple times in my lifetime" (02/08/2012)  . PONV (postoperative nausea and vomiting)    "and takes me Rodriguez long time to come out under it" (02/08/2012)  . Posterior vitreous detachment 1996  . Postmenopausal    s/p hysterectomy for h/o cervical cancer 1982 (both ovaries taken at that time) now on hormonal replacement   . Postmenopausal HRT (hormone replacement therapy)   . PPD positive    history +PPD 1984, no treatment, no abnormal CXR  . Shortness of breath    with ambulation  . Stroke [facility])    TIA- April 2016 last one  . Supraventricular tachycardia, paroxysmal ([place])   . Urinary frequency   . Vaginal pruritus    Past Surgical History:  Procedure Laterality Date  . ABDOMINAL HYSTERECTOMY  1982  . CARDIOVASCULAR  STRESS TEST  12/07/11   Normal nuclear stress test  . CATARACT EXTRACTION W/ INTRAOCULAR LENS IMPLANT  ?1997   right  . COLONOSCOPY    . EYE SURGERY     cataract removal right eye  . TOTAL ABDOMINAL HYSTERECTOMY W/ BILATERAL SALPINGOOPHORECTOMY  1982  . TOTAL KNEE ARTHROPLASTY  02/08/2012   Procedure: TOTAL KNEE ARTHROPLASTY;  Surgeon: Amy Rodriguez;  Location: [place];  Service: Orthopedics;  Laterality: Left;    Allergies  Allergies  Allergen Reactions  . Benzonatate Nausea And Vomiting    "tessalon pearl"  . Sulfonamide Derivatives Itching and Rash  . Fosamax [Alendronate]   . Hctz [Hydrochlorothiazide] Other (See Comments)    Severe hyponatremia     History of Present Illness    Ms. Cubbage was last seen by Amy Rodriguez on 01/07/2018.  During that time she noticed more dyspnea with extreme activities but did not notice dyspnea with routine activity.  Her dyspnea was relieved at rest and it was not associated with chest pain she denied orthopnea PND and lower extremity edema.  She denied syncope or palpitations and exertional chest pain.  She underwent Rodriguez nuclear stress test in August 2013 that showed an ejection fraction of 76% and normal perfusion.  Rodriguez Holter monitor study on 12/14 showed sinus  with PACs, PVCs and rare couplets.  An an echocardiogram on 4/15 showed normal LV function and carotid Dopplers on 11/2016 showed 1 to 39% bilateral stenosis.  PMH also includes hypertension, paroxysmal supraventricular tachycardia, TIA, aneurysm, COPD, emphysema, hypothyroidism, DDD, osteopenia, vitamin D deficiency, hyperlipidemia and venous stasis.  She presents the clinic today and states she has been having heart rates in the 40s and noticing increased fatigue.  She is reduced her atenolol to 1/4 tablet (6.25 mg) twice daily.  She goes on to state that over the years she is required less and less medication due to her resting heart rate being low.  She states her diet includes several  convenience foods such as deli meats, soup, breads, and sauces/salad dressings.  She denies chest pain, shortness of breath, lower extremity edema, fatigue, palpitations, melena, hematuria, hemoptysis, diaphoresis, weakness, presyncope, syncope, orthopnea, and PND.   Home Medications    Prior to Admission medications   Medication Sig Start Date End Date Taking? Authorizing Provider  albuterol (PROAIR HFA) 108 (90 Base) MCG/ACT inhaler Inhale 2 puffs into the lungs every 4 (four) hours as needed for wheezing or shortness of breath. 03/28/17   Amy Rodriguez  amLODipine (NORVASC) 2.5 MG tablet 5 mg (2 tabs)  in the am and 2.5 mg ( 1 tab) in the evening. 09/27/18   Amy Rodriguez  aspirin EC 81 MG tablet Take 81 mg by mouth daily.    Provider, Historical, Rodriguez  atenolol (TENORMIN) 25 MG tablet Take 0.5 tablets (12.5 mg total) by mouth 2 (two) times daily. 09/27/18   Amy Rodriguez  diclofenac sodium (VOLTAREN) 1 % GEL Apply 2 g topically as needed. 09/27/18   Amy Rodriguez  fexofenadine (ALLEGRA) 180 MG tablet Take 1 tablet (180 mg total) by mouth daily. Patient taking differently: Take 180 mg by mouth as needed.  08/03/15   Amy Rodriguez  levothyroxine (SYNTHROID) 75 MCG tablet Take 1 tablet (75 mcg total) by mouth daily before breakfast. 09/27/18   Amy Rodriguez  omeprazole (PRILOSEC) 10 MG capsule Take 1 capsule (10 mg total) by mouth as needed. 09/27/18   Amy Rodriguez  simvastatin (ZOCOR) 10 MG tablet Take 1 tablet (10 mg total) by mouth every evening. 02/13/18   Amy Rodriguez  Spacer/Aero-Holding Chambers (BREATHERITE COLL SPACER ADULT) MISC Use with  Albuterol Inhaler. 12/16/14   Amy Hillock, Rodriguez    Family History    Family History  Problem Relation Age of Onset  . Breast cancer Mother 70  . Lung cancer Brother 22  . Cancer Brother        lung  . Heart failure Father        congestive  . Heart disease Father   . Epilepsy Son   . Kidney  disease Son        tuberous sclerosis, both kidneys removed, has transplant  . Diabetes Maternal Uncle   . Diabetes Paternal Aunt   . Cancer Maternal Grandmother        breast  . Breast cancer Maternal Grandmother   . Heart disease Son        [place]  . COPD Son   . Rectal cancer Maternal Aunt   . Colon cancer Neg Hx   . Esophageal cancer Neg Hx   . Stomach cancer Neg Hx    She indicated that her mother is deceased. She indicated that her father is  deceased. She indicated that her brother is deceased. She indicated that her maternal grandmother is deceased. She indicated that her maternal grandfather is deceased. She indicated that her paternal grandmother is deceased. She indicated that her paternal grandfather is deceased. She indicated that all of her three sons are alive. She indicated that the status of her maternal aunt is unknown. She indicated that her maternal uncle is deceased. She indicated that her paternal aunt is deceased. She indicated that the status of her neg hx is unknown.  Social History    Social History   Socioeconomic History  . Marital status: Widowed    Spouse name: Not on file  . Number of children: 3  . Years of education: 12TH  . Highest education level: Not on file  Occupational History  . Occupation: retired    Comment: Academic librarian: RETIRED  Social Needs  . Financial resource strain: Not on file  . Food insecurity    Worry: Not on file    Inability: Not on file  . Transportation needs    Medical: Not on file    Non-medical: Not on file  Tobacco Use  . Smoking status: Former Smoker    Packs/day: 1.00    Years: 25.00    Pack years: 25.00    Types: Cigarettes    Quit date: 04/17/1980    Years since quitting: 38.7  . Smokeless tobacco: Never Used  . Tobacco comment: passive smoker, husband smoked  Substance and Sexual Activity  . Alcohol use: No  . Drug use: No  . Sexual activity: Never  Lifestyle  . Physical activity     Days per week: Not on file    Minutes per session: Not on file  . Stress: Not on file  Relationships  . Social Herbalist on phone: Not on file    Gets together: Not on file    Attends religious service: Not on file    Active member of club or organization: Not on file    Attends meetings of clubs or organizations: Not on file    Relationship status: Not on file  . Intimate partner violence    Fear of current or ex partner: Not on file    Emotionally abused: Not on file    Physically abused: Not on file    Forced sexual activity: Not on file  Other Topics Concern  . Not on file  Social History Narrative   Lives with son, [name] who has medical problems-they help each other. Her husband died Oct 21, 2022 after 37 years of marriage.  She has 2 other sons that live within 2 hrs from her. She does not drive-she uses [facility]. She used to work in [employer] (medical), retired.     Tobacco: 25 pack yr hx, quit 1985.     Review of Systems    General:  No chills, fever, night sweats or weight changes.  Cardiovascular:  No chest pain, dyspnea on exertion, edema, orthopnea, palpitations, paroxysmal nocturnal dyspnea. Dermatological: No rash, lesions/masses Respiratory: No cough, dyspnea Urologic: No hematuria, dysuria Abdominal:   No nausea, vomiting, diarrhea, bright red blood per rectum, melena, or hematemesis Neurologic:  No visual changes, wkns, changes in mental status. All other systems reviewed and are otherwise negative except as noted above.  Physical Exam    VS:  BP 120/62   Pulse (!) 51   Temp (!) 97.2 F (36.2 C) (Temporal)   Ht 5\' 1"  (1.549 m)  Wt 159 lb (72.1 kg)   SpO2 94%   BMI 30.04 kg/m  , BMI Body mass index is 30.04 kg/m. GEN: Well nourished, well developed, in no acute distress. HEENT: normal. Neck: Supple, no JVD, carotid bruits, or masses. Cardiac: RRR, no murmurs, rubs, or gallops. No clubbing, cyanosis, edema.  Radials/DP/PT 2+ and equal bilaterally.   Respiratory:  Respirations regular and unlabored, clear to auscultation bilaterally. GI: Soft, nontender, nondistended, BS + x 4. MS: no deformity or atrophy. Skin: warm and dry, no rash. Neuro:  Strength and sensation are intact. Psych: Normal affect.  Accessory Clinical Findings    ECG personally reviewed by me today-sinus bradycardia ST and T wave abnormality 51 bpm- No acute changes  EKG 01/07/2018 Normal sinus rhythm left axis deviation right bundle branch block 63 bpm  Echocardiogram 07/26/2013 Left ventricle: The cavity size was normal. Wall thickness  was normal. Systolic function was normal. The estimated  ejection fraction was in the range of 55% to 60%. Wall  motion was normal; there were no regional wall motion  abnormalities. Doppler parameters are consistent with  abnormal left ventricular relaxation (grade 1 diastolic  dysfunction).     Carotid ultrasound 12/12/2016 Heterogeneous plaque, bilaterally. Tortuous mid and distal ICA, left worse than right.  Stable 1-39% bilateral ICA stenosis. Normal subclavian arteries, bilaterally. Patent vertebral arteries with antegrade flow.  Assessment & Plan   SVT-only has occasional episodes of flutter, palpitations, and no cases of SVT.  She has decreased her atenolol to 1/4 (6.25 mg) tablet twice daily due to heart rates in the 40s and increased fatigue. Change  Atenolol to 12.5 mg tablet as needed up to twice daily for palpitations Heart healthy low-sodium diet Avoid caffeinated beverages and chocolate  Essential hypertension-BP today 120/62 Continue amlodipine 2.5 mg tablet daily Heart healthy low-sodium diet Increase physical activity as tolerated  Hyperlipidemia-09/27/2018: Cholesterol 116; HDL 44.10; LDL Cholesterol 51; Triglycerides 101.0; VLDL 20.2 Continue simvastatin 10 mg tablet daily Increase physical activity as tolerated  Carotid artery disease- stable bilateral ICA stenosis 1 to 39%, normal subclavian  arteries bilateral Continue simvastatin 10 mg tablet daily Continue 81 mg aspirin daily  Disposition: Follow-up with APP in 3 to 4 weeks  Deberah Pelton, NP-C 01/07/2019, 2:36 PM

## 2019-01-21 ENCOUNTER — Other Ambulatory Visit: Payer: Self-pay | Admitting: Cardiology

## 2019-01-21 DIAGNOSIS — E785 Hyperlipidemia, unspecified: Secondary | ICD-10-CM

## 2019-01-28 ENCOUNTER — Ambulatory Visit (INDEPENDENT_AMBULATORY_CARE_PROVIDER_SITE_OTHER): Payer: Medicare Other | Admitting: General Practice

## 2019-01-28 ENCOUNTER — Other Ambulatory Visit: Payer: Self-pay

## 2019-01-28 ENCOUNTER — Encounter: Payer: Self-pay | Admitting: Physician Assistant

## 2019-01-28 VITALS — BP 122/64 | HR 57 | Ht 62.0 in | Wt 158.0 lb

## 2019-01-28 DIAGNOSIS — R001 Bradycardia, unspecified: Secondary | ICD-10-CM | POA: Diagnosis not present

## 2019-01-28 DIAGNOSIS — I10 Essential (primary) hypertension: Secondary | ICD-10-CM | POA: Diagnosis not present

## 2019-01-28 DIAGNOSIS — R600 Localized edema: Secondary | ICD-10-CM | POA: Diagnosis not present

## 2019-01-28 DIAGNOSIS — E785 Hyperlipidemia, unspecified: Secondary | ICD-10-CM | POA: Diagnosis not present

## 2019-01-28 MED ORDER — SIMVASTATIN 10 MG PO TABS: ORAL_TABLET | ORAL | 3 refills | Status: DC

## 2019-01-28 NOTE — Patient Instructions (Signed)
Medication Instructions:  Your physician recommends that you continue on your current medications as directed. Please refer to the Current Medication list given to you today.  If you need a refill on your cardiac medications before your next appointment, please call your pharmacy.    Follow-Up: At [facility], you and your health needs are our priority.  As part of our continuing mission to provide you with exceptional heart care, we have created designated Provider Care Teams.  These Care Teams include your primary Cardiologist (physician) and Advanced Practice Providers (APPs -  Physician Assistants and Nurse Practitioners) who all work together to provide you with the care you need, when you need it. You will need a follow up appointment in 12 months.  Please call our office 2 months in advance to schedule this appointment.  You may see Dr. Stanford Breed or one of the following Advanced Practice Providers on your designated Care Team:   Kerin Ransom, PA-C Roby Lofts, [place] . Sande Rives, PA-C  Any Other Special Instructions Will Be Listed Below (If Applicable). Please use compression stockings and elevate your lower extremities when you are not active.

## 2019-01-28 NOTE — Progress Notes (Signed)
Cardiology Clinic Note   Patient Name: Amy Rodriguez Date of Encounter: 01/28/2019  Primary Care Provider:  Ma Hillock, Rodriguez Primary Cardiologist:  No primary care provider on file.  Patient Profile    Amy Rodriguez 85 year old female presents today for follow-up of her hypertension, tachycardia, and hyperlipidemia  Past Medical History    Past Medical History:  Diagnosis Date  . Arthritis    sed rate 10, RF, CCP pending  . Blood in stool   . Cervical cancer (Amy Rodriguez)    s/p hysterectomy/oop  . DDD (degenerative disc disease), lumbosacral   . Depression    Due to husbands passing away 09/22/2011  . Diverticulosis of colon   . Environmental allergies   . GERD (gastroesophageal reflux disease)   . H pylori ulcer    not treated due to expense  10/2001  . Hand dermatitis   . HTN (hypertension)   . Hyperlipidemia   . Hypothyroidism    TSH 14.488 (11/2005)  . IBS (irritable bowel syndrome)   . Lactose intolerance   . Migraines    "get them very rarely now" (02/08/2012)  . Multiple pigmented nevi    last derm evaluation 01/2003  . OA (osteoarthritis)    multiple sites  . Other seborrheic keratosis   . Pain in joint, lower leg   . Pneumonia    "couple times in my lifetime" (02/08/2012)  . PONV (postoperative nausea and vomiting)    "and takes me Rodriguez long time to come out under it" (02/08/2012)  . Posterior vitreous detachment 1996  . Postmenopausal    s/p hysterectomy for h/o cervical cancer 1982 (both ovaries taken at that time) now on hormonal replacement   . Postmenopausal HRT (hormone replacement therapy)   . PPD positive    history +PPD 1984, no treatment, no abnormal CXR  . Shortness of breath    with ambulation  . Stroke [facility])    TIA- April 2016 last one  . Supraventricular tachycardia, paroxysmal ([place])   . Urinary frequency   . Vaginal pruritus    Past Surgical History:  Procedure Laterality Date  . ABDOMINAL HYSTERECTOMY  1982  . CARDIOVASCULAR  STRESS TEST  12/07/11   Normal nuclear stress test  . CATARACT EXTRACTION W/ INTRAOCULAR LENS IMPLANT  ?1997   right  . COLONOSCOPY    . EYE SURGERY     cataract removal right eye  . TOTAL ABDOMINAL HYSTERECTOMY W/ BILATERAL SALPINGOOPHORECTOMY  1982  . TOTAL KNEE ARTHROPLASTY  02/08/2012   Procedure: TOTAL KNEE ARTHROPLASTY;  Surgeon: Amy Rodriguez;  Location: [place];  Service: Orthopedics;  Laterality: Left;    Allergies  Allergies  Allergen Reactions  . Benzonatate Nausea And Vomiting    "tessalon pearl"  . Sulfonamide Derivatives Itching and Rash  . Fosamax [Alendronate]   . Hctz [Hydrochlorothiazide] Other (See Comments)    Severe hyponatremia     History of Present Illness    Amy Rodriguez 85 year old female has Rodriguez past medical history of hypertension, paroxysmal SVT, TIA, aneurysm, COPD, emphysema, hypothyroidism, DDD, osteopenia, vitamin D deficiency, hyperlipidemia, and venous stasis.  She was last seen by me on 01/07/2019.  During that time she was noticing lower heart rates in the 40s and increasing fatigue.  She had reduced her atenolol to 1/4 tablet (6.25 mg) twice daily.  She had noticed that over the last few year she had required less and less beta blocking medication with her lower heart rate.  Her  atenolol was changed to 12.5 mg as needed for palpitations up to 2 times daily.    She presents to the clinic today and states she has felt better since stopping her atenolol medication.  She has noticed 3 episodes of heart palpitations since the last January 07, 2019 visit.  She states that with these episodes of palpitations she took Rodriguez quarter tablet 6.25 mg of atenolol once which corrected the palpitations.  She states that she feels like she has more energy and generally feels much better without taking the medication in Rodriguez scheduled manner.  She has resumed all of her normal daily activities.   She denies chest pain, shortness of breath, lower extremity edema,  fatigue, palpitations, melena, hematuria, hemoptysis, diaphoresis, weakness, presyncope, syncope, orthopnea, and PND.    Home Medications    Prior to Admission medications   Medication Sig Start Date End Date Taking? Authorizing Provider  albuterol (PROAIR HFA) 108 (90 Base) MCG/ACT inhaler Inhale 2 puffs into the lungs every 4 (four) hours as needed for wheezing or shortness of breath. 03/28/17   Amy Gobble, Rodriguez  amLODipine (NORVASC) 2.5 MG tablet 5 mg (2 tabs)  in the am and 2.5 mg ( 1 tab) in the evening. 09/27/18   Amy Rodriguez  aspirin EC 81 MG tablet Take 81 mg by mouth daily.    Provider, Historical, Rodriguez  atenolol (TENORMIN) 25 MG tablet Take 0.5 tablets (12.5 mg total) by mouth 2 (two) times daily as needed. 01/07/19   Amy Rodriguez  diclofenac sodium (VOLTAREN) 1 % GEL Apply 2 g topically as needed. 09/27/18   Amy Rodriguez  fexofenadine (ALLEGRA) 180 MG tablet Take 1 tablet (180 mg total) by mouth daily. Patient taking differently: Take 180 mg by mouth as needed.  08/03/15   Amy Rodriguez  levothyroxine (SYNTHROID) 75 MCG tablet Take 1 tablet (75 mcg total) by mouth daily before breakfast. 09/27/18   Amy Rodriguez  omeprazole (PRILOSEC) 10 MG capsule Take 1 capsule (10 mg total) by mouth as needed. 09/27/18   Amy Rodriguez  simvastatin (ZOCOR) 10 MG tablet TAKE 1 TABLET BY MOUTH EVERY DAY IN THE EVENING 01/21/19   Amy Rodriguez  Spacer/Aero-Holding Chambers (BREATHERITE COLL SPACER ADULT) MISC Use with  Albuterol Inhaler. 12/16/14   Amy Hillock, Rodriguez    Family History    Family History  Problem Relation Age of Onset  . Breast cancer Mother 26  . Lung cancer Brother 64  . Cancer Brother        lung  . Heart failure Father        congestive  . Heart disease Father   . Epilepsy Son   . Kidney disease Son        tuberous sclerosis, both kidneys removed, has transplant  . Diabetes Maternal Uncle   . Diabetes Paternal Aunt   . Cancer  Maternal Grandmother        breast  . Breast cancer Maternal Grandmother   . Heart disease Son        [place] white  . COPD Son   . Rectal cancer Maternal Aunt   . Colon cancer Neg Hx   . Esophageal cancer Neg Hx   . Stomach cancer Neg Hx    She indicated that her mother is deceased. She indicated that her father is deceased. She indicated that her brother is deceased. She indicated that her maternal grandmother  is deceased. She indicated that her maternal grandfather is deceased. She indicated that her paternal grandmother is deceased. She indicated that her paternal grandfather is deceased. She indicated that all of her three sons are alive. She indicated that the status of her maternal aunt is unknown. She indicated that her maternal uncle is deceased. She indicated that her paternal aunt is deceased. She indicated that the status of her neg hx is unknown.  Social History    Social History   Socioeconomic History  . Marital status: Widowed    Spouse name: Not on file  . Number of children: 3  . Years of education: 12TH  . Highest education level: Not on file  Occupational History  . Occupation: retired    Comment: Academic librarian: RETIRED  Social Needs  . Financial resource strain: Not on file  . Food insecurity    Worry: Not on file    Inability: Not on file  . Transportation needs    Medical: Not on file    Non-medical: Not on file  Tobacco Use  . Smoking status: Former Smoker    Packs/day: 1.00    Years: 25.00    Pack years: 25.00    Types: Cigarettes    Quit date: 04/17/1980    Years since quitting: 38.8  . Smokeless tobacco: Never Used  . Tobacco comment: passive smoker, husband smoked  Substance and Sexual Activity  . Alcohol use: No  . Drug use: No  . Sexual activity: Never  Lifestyle  . Physical activity    Days per week: Not on file    Minutes per session: Not on file  . Stress: Not on file  Relationships  . Social Herbalist on phone:  Not on file    Gets together: Not on file    Attends religious service: Not on file    Active member of club or organization: Not on file    Attends meetings of clubs or organizations: Not on file    Relationship status: Not on file  . Intimate partner violence    Fear of current or ex partner: Not on file    Emotionally abused: Not on file    Physically abused: Not on file    Forced sexual activity: Not on file  Other Topics Concern  . Not on file  Social History Narrative   Lives with son, [name] who has medical problems-they help each other. Her husband died 10-02-2022 after 26 years of marriage.  She has 2 other sons that live within 2 hrs from her. She does not drive-she uses [facility]. She used to work in Scient[employer], research (medical), retired.     Tobacco: 25 pack yr hx, quit 1985.     Review of Systems    General:  No chills, fever, night sweats or weight changes.  Cardiovascular:  No chest pain, dyspnea on exertion, edema, orthopnea, palpitations, paroxysmal nocturnal dyspnea. Dermatological: No rash, lesions/masses Respiratory: No cough, dyspnea Urologic: No hematuria, dysuria Abdominal:   No nausea, vomiting, diarrhea, bright red blood per rectum, melena, or hematemesis Neurologic:  No visual changes, wkns, changes in mental status. All other systems reviewed and are otherwise negative except as noted above.  Physical Exam    VS:  BP 122/64   Pulse (!) 57   Ht 5\' 2"  (1.575 Amy)   Wt 158 lb (71.7 kg)   SpO2 96%   BMI 28.90 kg/Amy  , BMI Body mass index is  28.9 kg/Amy. GEN: Well nourished, well developed, in no acute distress. HEENT: normal. Neck: Supple, no JVD, carotid bruits, or masses. Cardiac: RRR, no murmurs, rubs, or gallops. No clubbing, cyanosis, generalized bilateral lower extremity nonpitting edema.  Radials/DP/PT 2+ and equal bilaterally.  Respiratory:  Respirations regular and unlabored, clear to auscultation bilaterally. GI: Soft, nontender, nondistended, BS + x 4. MS: no  deformity or atrophy. Skin: warm and dry, no rash. Neuro:  Strength and sensation are intact. Psych: Normal affect.  Accessory Clinical Findings    ECG personally reviewed by me today-sinus bradycardia right bundle branch block 57 bpm- No acute changes  EKG 01/07/2019 Sinus bradycardia with ST and T wave abnormality 51 bpm  EKG 01/07/2018 Normal sinus rhythm left axis deviation right bundle branch block 63 bpm  Echocardiogram 07/26/2013 Left ventricle: The cavity size was normal. Wall thickness  was normal. Systolic function was normal. The estimated  ejection fraction was in the range of 55% to 60%. Wall  motion was normal; there were no regional wall motion  abnormalities. Doppler parameters are consistent with  abnormal left ventricular relaxation (grade 1 diastolic  dysfunction).     Carotid ultrasound 12/12/2016 Heterogeneous plaque, bilaterally. Tortuous mid and distal ICA, left worse than right.  Stable 1-39% bilateral ICA stenosis. Normal subclavian arteries, bilaterally. Patent vertebral arteries with antegrade flow. Assessment & Plan   Bradycardia- EKG today shows sinus bradycardia right bundle branch block 57 bpm.  Prior history of SVT Continue Atenolol to 12.5 mg tablet as needed up to twice daily for palpitations- patient taking quarter tablet or 6.25 mg as needed. Heart healthy low-sodium diet Avoid caffeinated beverages and chocolate  Essential hypertension-BP today 122/64 Continue amlodipine 2.5 mg tablet daily Heart healthy low-sodium diet-salty 6 given Increase physical activity as tolerated  Hyperlipidemia-09/27/2018: Cholesterol 116; HDL 44.10; LDL Cholesterol 51; Triglycerides 101.0; VLDL 20.2 Continue simvastatin 10 mg tablet daily Increase physical activity as tolerated  Lower extremity edema-bilateral lower extremity nonpitting edema. Heart healthy low-sodium diet- avoid high sodium foods, salty 6 given Compression stockings-below the knee  Elevate extremities throughout the day for at least 30 minutes when not active   No lab work ordered today.  Disposition: Follow-up with Dr. Stanford Breed in 1 year.  Amy Rodriguez-C 01/28/2019, 4:31 PM

## 2019-02-06 ENCOUNTER — Ambulatory Visit (INDEPENDENT_AMBULATORY_CARE_PROVIDER_SITE_OTHER): Payer: Medicare Other | Admitting: Internal Medicine

## 2019-02-06 ENCOUNTER — Other Ambulatory Visit: Payer: Self-pay

## 2019-02-06 ENCOUNTER — Encounter: Payer: Self-pay | Admitting: Internal Medicine

## 2019-02-06 VITALS — BP 140/70 | HR 65 | Ht 60.15 in | Wt 157.0 lb

## 2019-02-06 DIAGNOSIS — R600 Localized edema: Secondary | ICD-10-CM | POA: Diagnosis not present

## 2019-02-06 DIAGNOSIS — Z23 Encounter for immunization: Secondary | ICD-10-CM | POA: Diagnosis not present

## 2019-02-06 DIAGNOSIS — E063 Autoimmune thyroiditis: Secondary | ICD-10-CM

## 2019-02-06 DIAGNOSIS — E049 Nontoxic goiter, unspecified: Secondary | ICD-10-CM | POA: Diagnosis not present

## 2019-02-06 DIAGNOSIS — E038 Other specified hypothyroidism: Secondary | ICD-10-CM | POA: Diagnosis not present

## 2019-02-06 NOTE — Patient Instructions (Signed)
Please continue Levothyroxine 75 mcg.  Take the thyroid hormone every day, with water, at least 30 minutes before breakfast, separated by at least 4 hours from: - acid reflux medications - calcium - iron - multivitamins  Please return in 1 year.

## 2019-02-06 NOTE — Progress Notes (Signed)
[name], female   DOB: [birth year], 85 y.o.   MRN: [id number]   HPI  Amy Rodriguez is a 85 y.o.-year-old female, returning for f/u for hypothyroidism 2/2 to Hashimoto's thyroiditis and R goiter. Last visit 1 year and 3 months ago.  Pt. has been dx with hypothyroidism in 2011, and has Hashimoto's thyroiditis in 2016.  Pt is on levothyroxine 75 mcg daily, taken: - in am - fasting - at least 30 min from b'fast - no Fe, MVI -+ Tums and PPIs later in the day - not on Biotin  Reviewed her TFTs-normal: Lab Results  Component Value Date   TSH 1.93 09/27/2018   TSH 3.65 09/11/2017   TSH 2.96 11/03/2016   TSH 2.13 11/04/2015   TSH 2.57 08/03/2015   TSH 2.47 10/20/2014   TSH 1.63 07/08/2014   TSH 1.15 12/19/2013   TSH 0.78 09/19/2013   TSH 8.120 (H) 07/26/2013   FREET4 0.86 11/03/2016   FREET4 0.95 11/04/2015   FREET4 1.03 07/08/2014   FREET4 1.39 03/18/2012    Her TPO antibodies were elevated, indicating Hashimoto's thyroiditis Component     Latest Ref Rng & Units 07/08/2014 10/20/2014  Thyroperoxidase Ab SerPl-aCnc     <9 IU/mL >900 (H) >900 (H)   Enlarged right thyroid lobe   -Initially felt on palpation  Thyroid ultrasound (07/09/2014): Enlarged right lobe, with hypoechoic aspect and moth-eaten appearance suggestive of Hashimoto's thyroiditis.  Normal left lobe.  No nodules  Pt denies: - feeling nodules in neck - hoarseness - dysphagia - choking - SOB with lying down  She also has HL, HTN, GERD, vit D def., bronchiectasis, TIA - small aneurysm - being followed by Dr. Leonie Man . She had L TKR 2013.  She started to have leg swelling over the summer.  ROS: Constitutional: + weight gain/no weight loss, no fatigue, no subjective hyperthermia, no subjective hypothermia Eyes: no blurry vision, no xerophthalmia ENT: no sore throat, + see HPI Cardiovascular: no CP/no SOB/no palpitations/+ leg swelling Respiratory: no cough/no SOB/no wheezing  Gastrointestinal: no N/no V/no D/no C/no acid reflux Musculoskeletal: no muscle aches/no joint aches Skin: no rashes, no hair loss Neurological: no tremors/no numbness/no tingling/no dizziness  I reviewed pt's medications, allergies, PMH, social hx, family hx, and changes were documented in the history of present illness. Otherwise, unchanged from my initial visit note. Stopped Atenolol.  Her amlodipine dose was increased in 09/2018.  Past Medical History:  Diagnosis Date  . Arthritis    sed rate 10, RF, CCP pending  . Blood in stool   . Cervical cancer ([place])    s/p hysterectomy/oop  . DDD (degenerative disc disease), lumbosacral   . Depression    Due to husbands passing away 09/22/2011  . Diverticulosis of colon   . Environmental allergies   . GERD (gastroesophageal reflux disease)   . H pylori ulcer    not treated due to expense  10/2001  . Hand dermatitis   . HTN (hypertension)   . Hyperlipidemia   . Hypothyroidism    TSH 14.488 (11/2005)  . IBS (irritable bowel syndrome)   . Lactose intolerance   . Migraines    "get them very rarely now" (02/08/2012)  . Multiple pigmented nevi    last derm evaluation 01/2003  . OA (osteoarthritis)    multiple sites  . Other seborrheic keratosis   . Pain in joint, lower leg   . Pneumonia    "couple times in my lifetime" (02/08/2012)  .  PONV (postoperative nausea and vomiting)    "and takes me a long time to come out under it" (02/08/2012)  . Posterior vitreous detachment 1996  . Postmenopausal    s/p hysterectomy for h/o cervical cancer 1982 (both ovaries taken at that time) now on hormonal replacement   . Postmenopausal HRT (hormone replacement therapy)   . PPD positive    history +PPD 1984, no treatment, no abnormal CXR  . Shortness of breath    with ambulation  . Stroke [facility])    TIA- April 2016 last one  . Supraventricular tachycardia, paroxysmal ([place])   . Urinary frequency   . Vaginal pruritus    Past Surgical History:   Procedure Laterality Date  . ABDOMINAL HYSTERECTOMY  1982  . CARDIOVASCULAR STRESS TEST  12/07/11   Normal nuclear stress test  . CATARACT EXTRACTION W/ INTRAOCULAR LENS IMPLANT  ?1997   right  . COLONOSCOPY    . EYE SURGERY     cataract removal right eye  . TOTAL ABDOMINAL HYSTERECTOMY W/ BILATERAL SALPINGOOPHORECTOMY  1982  . TOTAL KNEE ARTHROPLASTY  02/08/2012   Procedure: TOTAL KNEE ARTHROPLASTY;  Surgeon: Hessie Dibble, MD;  Location: [place];  Service: Orthopedics;  Laterality: Left;   Social History   Socioeconomic History  . Marital status: Widowed    Spouse name: Not on file  . Number of children: 3  . Years of education: 12TH  . Highest education level: Not on file  Occupational History  . Occupation: retired    Comment: Academic librarian: RETIRED  Social Needs  . Financial resource strain: Not on file  . Food insecurity    Worry: Not on file    Inability: Not on file  . Transportation needs    Medical: Not on file    Non-medical: Not on file  Tobacco Use  . Smoking status: Former Smoker    Packs/day: 1.00    Years: 25.00    Pack years: 25.00    Types: Cigarettes    Quit date: 04/17/1980    Years since quitting: 38.8  . Smokeless tobacco: Never Used  . Tobacco comment: passive smoker, husband smoked  Substance and Sexual Activity  . Alcohol use: No  . Drug use: No  . Sexual activity: Never  Lifestyle  . Physical activity    Days per week: Not on file    Minutes per session: Not on file  . Stress: Not on file  Relationships  . Social Herbalist on phone: Not on file    Gets together: Not on file    Attends religious service: Not on file    Active member of club or organization: Not on file    Attends meetings of clubs or organizations: Not on file    Relationship status: Not on file  . Intimate partner violence    Fear of current or ex partner: Not on file    Emotionally abused: Not on file    Physically abused: Not on file    Forced  sexual activity: Not on file  Other Topics Concern  . Not on file  Social History Narrative   Lives with son, [name] who has medical problems-they help each other. Her husband died October 27, 2022 after 60 years of marriage.  She has 2 other sons that live within 2 hrs from her. She does not drive-she uses [facility]. She used to work in Scient[employer], research (medical), retired.     Tobacco: 25 pack yr  hx, quit 1985.   Current Outpatient Medications on File Prior to Visit  Medication Sig Dispense Refill  . albuterol (PROAIR HFA) 108 (90 Base) MCG/ACT inhaler Inhale 2 puffs into the lungs every 4 (four) hours as needed for wheezing or shortness of breath. 1 Inhaler 3  . amLODipine (NORVASC) 2.5 MG tablet 5 mg (2 tabs)  in the am and 2.5 mg ( 1 tab) in the evening. 270 tablet 1  . atenolol (TENORMIN) 25 MG tablet Take 0.5 tablets (12.5 mg total) by mouth 2 (two) times daily as needed. 180 tablet 1  . diclofenac sodium (VOLTAREN) 1 % GEL Apply 2 g topically as needed. 100 g 11  . levothyroxine (SYNTHROID) 75 MCG tablet Take 1 tablet (75 mcg total) by mouth daily before breakfast. 90 tablet 3  . omeprazole (PRILOSEC) 10 MG capsule Take 1 capsule (10 mg total) by mouth as needed. 90 capsule 3  . simvastatin (ZOCOR) 10 MG tablet TAKE 1 TABLET BY MOUTH EVERY DAY IN THE EVENING 90 tablet 3  . Spacer/Aero-Holding Chambers (BREATHERITE COLL SPACER ADULT) MISC Use with  Albuterol Inhaler. 1 each 0   No current facility-administered medications on file prior to visit.    Allergies  Allergen Reactions  . Benzonatate Nausea And Vomiting    "tessalon pearl"  . Sulfonamide Derivatives Itching and Rash  . Fosamax [Alendronate]   . Hctz [Hydrochlorothiazide] Other (See Comments)    Severe hyponatremia    Family History  Problem Relation Age of Onset  . Breast cancer Mother 81  . Lung cancer Brother 66  . Cancer Brother        lung  . Heart failure Father        congestive  . Heart disease Father   . Epilepsy Son   .  Kidney disease Son        tuberous sclerosis, both kidneys removed, has transplant  . Diabetes Maternal Uncle   . Diabetes Paternal Aunt   . Cancer Maternal Grandmother        breast  . Breast cancer Maternal Grandmother   . Heart disease Son        [place] white  . COPD Son   . Rectal cancer Maternal Aunt   . Colon cancer Neg Hx   . Esophageal cancer Neg Hx   . Stomach cancer Neg Hx    PE: BP 140/70   Pulse 65   Ht 5' 0.15" (1.528 m) Comment: measured today without shoes  Wt 157 lb (71.2 kg)   SpO2 94%   BMI 30.51 kg/m  Body mass index is 30.51 kg/m. Wt Readings from Last 3 Encounters:  02/06/19 157 lb (71.2 kg)  01/28/19 158 lb (71.7 kg)  01/07/19 159 lb (72.1 kg)   Constitutional: overweight, in NAD Eyes: PERRLA, EOMI, no exophthalmos ENT: moist mucous membranes, +R thyromegaly, no cervical lymphadenopathy Cardiovascular: RRR, No MRG, + bilateral pitting edema-wears compression hoses Respiratory: CTA B Gastrointestinal: abdomen soft, NT, ND, BS+ Musculoskeletal: no deformities, strength intact in all 4 Skin: moist, warm, no rashes Neurological: no tremor with outstretched hands, DTR normal in all 4  ASSESSMENT: 1. Hypothyroidism - 2/2 Hashimoto's thyroiditis  2.  Right goiter - Thyroid U/S (07/09/2014):   Right thyroid lobe: 7.6 x 3.3 x 3.6 cm. The right thyroid lobe is diffusely enlarged and hypoechoic. Right thyroid tissue is mildly lobular but there is not a focal nodule. (honeycomb appearance)   Left thyroid lobe: 5.1 x 2.2 x 2.2 cm. Left  thyroid tissue is heterogeneous but more hyperechoic than the right thyroid lobe and isthmus. There is not a focal nodule. Left thyroid tissue is very vascular.   Isthmus Thickness: 0.8 cm. Isthmus is prominent for size and has a similar hypoechoic appearance as the right thyroid lobe.   Lymphadenopathy None visualized.    3. Leg swelling  PLAN:  1. Patient with several years of Hashimoto's  hypothyroidism, with good control - latest thyroid labs reviewed with pt >> normal  - she continues on LT4 75 mcg daily - pt feels good on this dose.  She has no complaints other than weight gain (reviewing her chart, she only gained 2 pounds since last visit), and also leg swelling. - we discussed about taking the thyroid hormone every day, with water, >30 minutes before breakfast, separated by >4 hours from acid reflux medications, calcium, iron, multivitamins. Pt. is taking it correctly. - she will have the TSH and fT4 repeated by PCP next summer.  2. Right thyroid lobe enlargement -Reviewed the images of her thyroid ultrasound from 2016: Large right thyroid lobe, however, without nodules and with heterogeneous aspect, consistent with Hashimoto's thyroiditis.  It also contains septations and it is hypoechoic.   -She denies any compression symptoms -we discussed that there is no need to repeat a thyroid ultrasound on that she starts having neck compression symptoms  3.  Bilateral leg swelling -The leg swelling started over the summer -We discussed that this may have been caused by increasing her amlodipine dose.  She confirmed that the swelling started after she increase the dose.  - time spent with the patient: 25 min, of which >50% was spent in obtaining information about her symptoms, reviewing her previous labs, evaluations, and treatments, counseling her about her conditions (please see the discussed topics above), and developing a plan to further investigate and treat them; she had a number of questions which I addressed.  Philemon Kingdom, MD PhD [facility] Endocrinology

## 2019-03-28 ENCOUNTER — Encounter: Payer: Self-pay | Admitting: Family Medicine

## 2019-03-28 ENCOUNTER — Other Ambulatory Visit: Payer: Self-pay

## 2019-03-28 ENCOUNTER — Ambulatory Visit (INDEPENDENT_AMBULATORY_CARE_PROVIDER_SITE_OTHER): Payer: Medicare Other | Admitting: Family Medicine

## 2019-03-28 VITALS — BP 138/78 | HR 64 | Temp 98.0°F | Resp 16 | Wt 159.6 lb

## 2019-03-28 DIAGNOSIS — E663 Overweight: Secondary | ICD-10-CM

## 2019-03-28 DIAGNOSIS — E038 Other specified hypothyroidism: Secondary | ICD-10-CM

## 2019-03-28 DIAGNOSIS — J449 Chronic obstructive pulmonary disease, unspecified: Secondary | ICD-10-CM

## 2019-03-28 DIAGNOSIS — I729 Aneurysm of unspecified site: Secondary | ICD-10-CM | POA: Diagnosis not present

## 2019-03-28 DIAGNOSIS — I10 Essential (primary) hypertension: Secondary | ICD-10-CM

## 2019-03-28 DIAGNOSIS — G459 Transient cerebral ischemic attack, unspecified: Secondary | ICD-10-CM

## 2019-03-28 DIAGNOSIS — I679 Cerebrovascular disease, unspecified: Secondary | ICD-10-CM | POA: Diagnosis not present

## 2019-03-28 DIAGNOSIS — E063 Autoimmune thyroiditis: Secondary | ICD-10-CM

## 2019-03-28 DIAGNOSIS — E7800 Pure hypercholesterolemia, unspecified: Secondary | ICD-10-CM | POA: Diagnosis not present

## 2019-03-28 DIAGNOSIS — R7303 Prediabetes: Secondary | ICD-10-CM

## 2019-03-28 DIAGNOSIS — I471 Supraventricular tachycardia: Secondary | ICD-10-CM

## 2019-03-28 DIAGNOSIS — I878 Other specified disorders of veins: Secondary | ICD-10-CM

## 2019-03-28 MED ORDER — AMLODIPINE BESYLATE 5 MG PO TABS: 5.0000 mg | ORAL_TABLET | Freq: Every day | ORAL | 1 refills | Status: DC

## 2019-03-28 MED ORDER — LISINOPRIL 5 MG PO TABS: 5.0000 mg | ORAL_TABLET | Freq: Every day | ORAL | 0 refills | Status: DC

## 2019-03-28 NOTE — Patient Instructions (Signed)
Decreased amlodipine to 5 mg in the morning only. New script will be 5 mg per pill- so only take one then.  Start lisinopril 5 mg in the morning. Monitor BP abiut 2 hours after meds taken and make sure < 130/80. If higher please call in to be seen.   Otherwise we will see you in 3 mos for recheck and please bring your home BP cuff to compare.

## 2019-03-28 NOTE — Progress Notes (Signed)
Amy Rodriguez , Sep 24, 1933, 85 y.o., female MRN: [id number] Patient Care Team    Relationship Specialty Notifications Start End  Ma Hillock, DO PCP - General Family Medicine  01/08/15   Shon Hough, MD Consulting Physician Ophthalmology  08/03/15   Collene Gobble, MD Consulting Physician Pulmonary Disease  08/03/15   Lelon Perla, MD Consulting Physician Cardiology  08/03/15   Garvin Fila, MD Consulting Physician Neurology  08/03/15   Inda Castle, MD (Inactive) Consulting Physician Gastroenterology  08/04/15   Philemon Kingdom, MD Consulting Physician Internal Medicine  08/07/16   Melrose Nakayama, MD Consulting Physician Orthopedic Surgery  08/07/16     Chief Complaint  Patient presents with  . Follow-up    6 month follow-up   . Foot Swelling    Right foot swollen.     Subjective: Amy Rodriguez is a 85 y.o. female present for [facility] follow up  Hypertension/hyperlipidemia/overweight: Patient reports since increasing amlodipine to a total dose of 7.5 mg she has noticed right lower extremity edema surrounding her ankle.  Patient reports compliance with daily baby aspirin and Zocor 10 mg daily. Patient does have a small MCA aneurysm, SVT, history of stroke/tia and is followed by cardiology.  She had been on atenolol low-dose and this was discontinued recently by cardiology.  She does feel more improved energy without use.  Patient denies chest pain, shortness of breath, dizziness. -Labs up-to-date 09/27/2018  Hypothyroid: Patient reports compliance with levothyroxine 75 g daily.  She is followed by endocrinology for this condition.  GERD: Patient reports she takes Prilosec 10 mg as needed.  She does not need refills on this today.   Depression screen [facility] 2/9 09/27/2018 08/14/2017 08/07/2016 08/03/2015 07/08/2014  Decreased Interest 0 0 0 0 0  Down, Depressed, Hopeless 0 0 0 0 0  PHQ - 2 Score 0 0 0 0 0  Some recent data might be hidden    Allergies  Allergen  Reactions  . Benzonatate Nausea And Vomiting    "tessalon pearl"  . Sulfonamide Derivatives Itching and Rash  . Fosamax [Alendronate]   . Hctz [Hydrochlorothiazide] Other (See Comments)    Severe hyponatremia    Social History   Tobacco Use  . Smoking status: Former Smoker    Packs/day: 1.00    Years: 25.00    Pack years: 25.00    Types: Cigarettes    Quit date: 04/17/1980    Years since quitting: 38.9  . Smokeless tobacco: Never Used  . Tobacco comment: passive smoker, husband smoked  Substance Use Topics  . Alcohol use: No   Past Medical History:  Diagnosis Date  . Arthritis    sed rate 10, RF, CCP pending  . Blood in stool   . Cervical cancer ([place])    s/p hysterectomy/oop  . DDD (degenerative disc disease), lumbosacral   . Depression    Due to husbands passing away 09/22/2011  . Diverticulosis of colon   . Environmental allergies   . GERD (gastroesophageal reflux disease)   . H pylori ulcer    not treated due to expense  10/2001  . Hand dermatitis   . HTN (hypertension)   . Hyperlipidemia   . Hypothyroidism    TSH 14.488 (11/2005)  . IBS (irritable bowel syndrome)   . Lactose intolerance   . Migraines    "get them very rarely now" (02/08/2012)  . Multiple pigmented nevi    last derm evaluation 01/2003  . OA (osteoarthritis)  multiple sites  . Other seborrheic keratosis   . Pain in joint, lower leg   . Pneumonia    "couple times in my lifetime" (02/08/2012)  . PONV (postoperative nausea and vomiting)    "and takes me a long time to come out under it" (02/08/2012)  . Posterior vitreous detachment 1996  . Postmenopausal    s/p hysterectomy for h/o cervical cancer 1982 (both ovaries taken at that time) now on hormonal replacement   . Postmenopausal HRT (hormone replacement therapy)   . PPD positive    history +PPD 1984, no treatment, no abnormal CXR  . Shortness of breath    with ambulation  . Stroke [facility])    TIA- April 2016 last one  . Supraventricular  tachycardia, paroxysmal ([place])   . Urinary frequency   . Vaginal pruritus    Past Surgical History:  Procedure Laterality Date  . ABDOMINAL HYSTERECTOMY  1982  . CARDIOVASCULAR STRESS TEST  12/07/11   Normal nuclear stress test  . CATARACT EXTRACTION W/ INTRAOCULAR LENS IMPLANT  ?1997   right  . COLONOSCOPY    . EYE SURGERY     cataract removal right eye  . TOTAL ABDOMINAL HYSTERECTOMY W/ BILATERAL SALPINGOOPHORECTOMY  1982  . TOTAL KNEE ARTHROPLASTY  02/08/2012   Procedure: TOTAL KNEE ARTHROPLASTY;  Surgeon: Hessie Dibble, MD;  Location: [place];  Service: Orthopedics;  Laterality: Left;   Family History  Problem Relation Age of Onset  . Breast cancer Mother 29  . Lung cancer Brother 28  . Cancer Brother        lung  . Heart failure Father        congestive  . Heart disease Father   . Epilepsy Son   . Kidney disease Son        tuberous sclerosis, both kidneys removed, has transplant  . Diabetes Maternal Uncle   . Diabetes Paternal Aunt   . Cancer Maternal Grandmother        breast  . Breast cancer Maternal Grandmother   . Heart disease Son        [place]  . COPD Son   . Rectal cancer Maternal Aunt   . Colon cancer Neg Hx   . Esophageal cancer Neg Hx   . Stomach cancer Neg Hx    Allergies as of 03/28/2019      Reactions   Benzonatate Nausea And Vomiting   "tessalon pearl"   Sulfonamide Derivatives Itching, Rash   Fosamax [alendronate]    Hctz [hydrochlorothiazide] Other (See Comments)   Severe hyponatremia       Medication List       Accurate as of March 28, 2019 11:59 PM. If you have any questions, ask your nurse or doctor.        albuterol 108 (90 Base) MCG/ACT inhaler Commonly known as: ProAir HFA Inhale 2 puffs into the lungs every 4 (four) hours as needed for wheezing or shortness of breath.   amLODipine 5 MG tablet Commonly known as: NORVASC Take 1 tablet (5 mg total) by mouth daily. What changed:   medication strength  how much  to take  how to take this  when to take this  additional instructions Changed by: Howard Pouch, DO   BreatheRite Coll Spacer Adult Misc Use with  Albuterol Inhaler.   diclofenac sodium 1 % Gel Commonly known as: Voltaren Apply 2 g topically as needed.   levothyroxine 75 MCG tablet Commonly known as: SYNTHROID Take 1 tablet (75  mcg total) by mouth daily before breakfast.   lisinopril 5 MG tablet Commonly known as: ZESTRIL Take 1 tablet (5 mg total) by mouth daily. Started by: Howard Pouch, DO   omeprazole 10 MG capsule Commonly known as: PRILOSEC Take 1 capsule (10 mg total) by mouth as needed.   simvastatin 10 MG tablet Commonly known as: ZOCOR TAKE 1 TABLET BY MOUTH EVERY DAY IN THE EVENING       No results found for this or any previous visit (from the past 24 hour(s)). No results found.   ROS: Negative, with the exception of above mentioned in HPI   Objective:  BP 138/78 (BP Location: Left Arm, Patient Position: Sitting, Cuff Size: Normal)   Pulse 64   Temp 98 F (36.7 C) (Temporal)   Resp 16   Wt 159 lb 9.6 oz (72.4 kg)   SpO2 96%   BMI 31.01 kg/m  Body mass index is 31.01 kg/m. Gen: Afebrile. No acute distress.  Very pleasant, obese Caucasian female. HENT: AT. [place].  Eyes:Pupils Equal Round Reactive to light, Extraocular movements intact,  Conjunctiva without redness, discharge or icterus. Neck/lymp/endocrine: Supple, no lymphadenopathy, no thyromegaly CV: RRR no murmur, trace edema right lower extremity Chest: CTAB, no wheeze or crackles Skin: Hemosiderin staining left lower extremity and mild right lower extremity no purpura or petechiae.  Neuro: PERLA. EOMi. Alert. Oriented x3  Psych: Normal affect, dress and demeanor. Normal speech. Normal thought content and judgment.  Assessment/Plan: Amy Rodriguez is a 85 y.o. female present for OV for follow-up on chronic medical conditions. SVT/TIA history/small MCA  aneurysm/hypertension/hyperlipidemia/CVD/overweight: -Her home blood pressures are within goal.  However due to increased edema with higher dose amlodipine will decrease amlodipine back to 5 mg daily. -Lisinopril 5 mg daily added to her regimen today. BP goal less than 130/80-continue to monitor. -Was on low-dose beta-blocker that has been discontinued secondary to decreased energy and low heart rate. - Continue 81 mg ASA.  - Continue simvastatin 10 mg QD Follow-up 3 months.  Patient was asked to bring her blood pressure cuff in so that we may compare.  hypothyroidism tsh normal 09/2018. Continue to follow yearly chronic medical conditions appointments. - Continue levothyroxine 75 g daily is followed by endocrine  GERD:  Stable refills on Prilosec 10 mg daily PRN f/u 6 months on chronic issues   Reviewed expectations re: course of current medical issues.  Discussed self-management of symptoms.  Outlined signs and symptoms indicating need for more acute intervention.  Patient verbalized understanding and all questions were answered.  Patient received an After-Visit Summary.    No orders of the defined types were placed in this encounter.  > 25 minutes spent with patient, >50% of time spent face to face    electronically signed by:  Howard Pouch, DO  [place]

## 2019-03-31 ENCOUNTER — Encounter: Payer: Self-pay | Admitting: Family Medicine

## 2019-06-27 ENCOUNTER — Encounter: Payer: Self-pay | Admitting: Family Medicine

## 2019-06-27 ENCOUNTER — Other Ambulatory Visit: Payer: Self-pay

## 2019-06-27 ENCOUNTER — Ambulatory Visit (INDEPENDENT_AMBULATORY_CARE_PROVIDER_SITE_OTHER): Payer: Medicare Other | Admitting: Family Medicine

## 2019-06-27 VITALS — BP 136/75 | HR 72 | Temp 98.7°F | Ht 60.0 in | Wt 154.6 lb

## 2019-06-27 DIAGNOSIS — J329 Chronic sinusitis, unspecified: Secondary | ICD-10-CM

## 2019-06-27 DIAGNOSIS — E038 Other specified hypothyroidism: Secondary | ICD-10-CM

## 2019-06-27 DIAGNOSIS — I679 Cerebrovascular disease, unspecified: Secondary | ICD-10-CM

## 2019-06-27 DIAGNOSIS — I729 Aneurysm of unspecified site: Secondary | ICD-10-CM | POA: Diagnosis not present

## 2019-06-27 DIAGNOSIS — E7800 Pure hypercholesterolemia, unspecified: Secondary | ICD-10-CM | POA: Diagnosis not present

## 2019-06-27 DIAGNOSIS — B9689 Other specified bacterial agents as the cause of diseases classified elsewhere: Secondary | ICD-10-CM

## 2019-06-27 DIAGNOSIS — I10 Essential (primary) hypertension: Secondary | ICD-10-CM | POA: Diagnosis not present

## 2019-06-27 DIAGNOSIS — E063 Autoimmune thyroiditis: Secondary | ICD-10-CM

## 2019-06-27 DIAGNOSIS — E663 Overweight: Secondary | ICD-10-CM

## 2019-06-27 MED ORDER — AMOXICILLIN-POT CLAVULANATE 875-125 MG PO TABS: 1.0000 | ORAL_TABLET | Freq: Two times a day (BID) | ORAL | 0 refills | Status: DC

## 2019-06-27 MED ORDER — AMLODIPINE BESYLATE 5 MG PO TABS: 5.0000 mg | ORAL_TABLET | Freq: Every day | ORAL | 1 refills | Status: DC

## 2019-06-27 MED ORDER — LOSARTAN POTASSIUM 25 MG PO TABS: 12.5000 mg | ORAL_TABLET | Freq: Every day | ORAL | 1 refills | Status: DC

## 2019-06-27 MED ORDER — FEXOFENADINE HCL 180 MG PO TABS: 180.0000 mg | ORAL_TABLET | Freq: Every day | ORAL | 3 refills | Status: DC

## 2019-06-27 NOTE — Patient Instructions (Addendum)
COVID-19 Vaccine Information can be found at:   call [phone] or Covid Vaccine appointment go to [email or web address]   Sinus infection/allergies:  Start allegra daily for allergies. This is a one a day pill that will not cause drowsiness.  Start augmentin- antibiotic- every 12 hours for 10 days with food.   Blood pressure:  Stop lisinopril.  Start the losartan 1/2 tab daily. Continue amlodipine.

## 2019-06-27 NOTE — Progress Notes (Signed)
Amy Rodriguez , Oct 10, 1933, 86 y.o., female MRN: [id number] Patient Care Team    Relationship Specialty Notifications Start End  Ma Hillock, DO PCP - General Family Medicine  01/08/15   Shon Hough, MD Consulting Physician Ophthalmology  08/03/15   Collene Gobble, MD Consulting Physician Pulmonary Disease  08/03/15   Lelon Perla, MD Consulting Physician Cardiology  08/03/15   Garvin Fila, MD Consulting Physician Neurology  08/03/15   Inda Castle, MD (Inactive) Consulting Physician Gastroenterology  08/04/15   Philemon Kingdom, MD Consulting Physician Internal Medicine  08/07/16   Melrose Nakayama, MD Consulting Physician Orthopedic Surgery  08/07/16     Chief Complaint  Patient presents with  . Blood Pressure    Pt brought machine to check her BP and compare to our reading.   . Cough    x 2-3 months, pt feels related to sinuses. Having PND and productive cough with white mucous. Pt using OTC Allergy meds. Gets very congested at night and has to use breathing strips to help her breathe at night. Pt also has been having nosebleeds x 2-3 weeks. Denies ever running a fever    Subjective: Amy Rodriguez is a 86 y.o. female present for [facility] follow up and reports nosebleeds and cough is occurred since starting the lisinopril and has worsened over the last 2 weeks.  She reports nasal drainage, runny nose and bloody noses.  She has been using Claritin-D to help with her nasal congestion.  Hypertension/hyperlipidemia/overweight: Patient reports compliance with amlodipine 5 mg daily.  At higher doses she had lower extremity edema.  She reports since starting the lisinopril approximately 3 months ago she has noticed a consistent tickle cough.  Patient reports compliance with daily baby aspirin and Zocor 10 mg daily. Patient does have a small MCA aneurysm, SVT, history of stroke/tia and is followed by cardiology.  She had been on atenolol low-dose and this was discontinued recently  by cardiology.  She does feel more improved energy without use.  Patient denies chest pain, shortness of breath, dizziness or lower extremity edema.  -Labs up-to-date 09/27/2018  Hypothyroid: Patient reports compliance with levothyroxine 75 g daily.  09/27/2018 TSH 1.93.  GERD: Patient reports she takes Prilosec 10 mg as needed.  Depression screen [facility] 2/9 09/27/2018 08/14/2017 08/07/2016 08/03/2015 07/08/2014  Decreased Interest 0 0 0 0 0  Down, Depressed, Hopeless 0 0 0 0 0  PHQ - 2 Score 0 0 0 0 0  Some recent data might be hidden    Allergies  Allergen Reactions  . Benzonatate Nausea And Vomiting    "tessalon pearl"  . Sulfonamide Derivatives Itching and Rash  . Fosamax [Alendronate]   . Hctz [Hydrochlorothiazide] Other (See Comments)    Severe hyponatremia   . Lisinopril Cough   Social History   Tobacco Use  . Smoking status: Former Smoker    Packs/day: 1.00    Years: 25.00    Pack years: 25.00    Types: Cigarettes    Quit date: 04/17/1980    Years since quitting: 39.2  . Smokeless tobacco: Never Used  . Tobacco comment: passive smoker, husband smoked  Substance Use Topics  . Alcohol use: No   Past Medical History:  Diagnosis Date  . Arthritis    sed rate 10, RF, CCP pending  . Blood in stool   . Cervical cancer ([place])    s/p hysterectomy/oop  . DDD (degenerative disc disease), lumbosacral   . Depression  Due to husbands passing away 09/22/2011  . Diverticulosis of colon   . Environmental allergies   . GERD (gastroesophageal reflux disease)   . H pylori ulcer    not treated due to expense  10/2001  . Hand dermatitis   . HTN (hypertension)   . Hyperlipidemia   . Hypothyroidism    TSH 14.488 (11/2005)  . IBS (irritable bowel syndrome)   . Lactose intolerance   . Migraines    "get them very rarely now" (02/08/2012)  . Multiple pigmented nevi    last derm evaluation 01/2003  . OA (osteoarthritis)    multiple sites  . Other seborrheic keratosis   . Pain in  joint, lower leg   . Pneumonia    "couple times in my lifetime" (02/08/2012)  . PONV (postoperative nausea and vomiting)    "and takes me a long time to come out under it" (02/08/2012)  . Posterior vitreous detachment 1996  . Postmenopausal    s/p hysterectomy for h/o cervical cancer 1982 (both ovaries taken at that time) now on hormonal replacement   . Postmenopausal HRT (hormone replacement therapy)   . PPD positive    history +PPD 1984, no treatment, no abnormal CXR  . Shortness of breath    with ambulation  . Stroke [facility])    TIA- April 2016 last one  . Supraventricular tachycardia, paroxysmal ([place])   . Urinary frequency   . Vaginal pruritus    Past Surgical History:  Procedure Laterality Date  . ABDOMINAL HYSTERECTOMY  1982  . CARDIOVASCULAR STRESS TEST  12/07/11   Normal nuclear stress test  . CATARACT EXTRACTION W/ INTRAOCULAR LENS IMPLANT  ?1997   right  . COLONOSCOPY    . EYE SURGERY     cataract removal right eye  . TOTAL ABDOMINAL HYSTERECTOMY W/ BILATERAL SALPINGOOPHORECTOMY  1982  . TOTAL KNEE ARTHROPLASTY  02/08/2012   Procedure: TOTAL KNEE ARTHROPLASTY;  Surgeon: Hessie Dibble, MD;  Location: [place];  Service: Orthopedics;  Laterality: Left;   Family History  Problem Relation Age of Onset  . Breast cancer Mother 48  . Lung cancer Brother 18  . Cancer Brother        lung  . Heart failure Father        congestive  . Heart disease Father   . Epilepsy Son   . Kidney disease Son        tuberous sclerosis, both kidneys removed, has transplant  . Diabetes Maternal Uncle   . Diabetes Paternal Aunt   . Cancer Maternal Grandmother        breast  . Breast cancer Maternal Grandmother   . Heart disease Son        [place] white  . COPD Son   . Rectal cancer Maternal Aunt   . Colon cancer Neg Hx   . Esophageal cancer Neg Hx   . Stomach cancer Neg Hx    Allergies as of 06/27/2019      Reactions   Benzonatate Nausea And Vomiting   "tessalon pearl"    Sulfonamide Derivatives Itching, Rash   Fosamax [alendronate]    Hctz [hydrochlorothiazide] Other (See Comments)   Severe hyponatremia    Lisinopril Cough      Medication List       Accurate as of June 27, 2019 11:59 PM. If you have any questions, ask your nurse or doctor.        STOP taking these medications   BreatheRite Coll Spacer Adult Misc  Stopped by: Howard Pouch, DO   lisinopril 5 MG tablet Commonly known as: ZESTRIL Stopped by: Howard Pouch, DO     TAKE these medications   albuterol 108 (90 Base) MCG/ACT inhaler Commonly known as: ProAir HFA Inhale 2 puffs into the lungs every 4 (four) hours as needed for wheezing or shortness of breath.   amLODipine 5 MG tablet Commonly known as: NORVASC Take 1 tablet (5 mg total) by mouth daily.   amoxicillin-clavulanate 875-125 MG tablet Commonly known as: AUGMENTIN Take 1 tablet by mouth 2 (two) times daily. Started by: Howard Pouch, DO   diclofenac sodium 1 % Gel Commonly known as: Voltaren Apply 2 g topically as needed.   fexofenadine 180 MG tablet Commonly known as: Allegra Allergy Take 1 tablet (180 mg total) by mouth daily. Started by: Howard Pouch, DO   levothyroxine 75 MCG tablet Commonly known as: SYNTHROID Take 1 tablet (75 mcg total) by mouth daily before breakfast.   losartan 25 MG tablet Commonly known as: COZAAR Take 0.5 tablets (12.5 mg total) by mouth daily. Started by: Howard Pouch, DO   omeprazole 10 MG capsule Commonly known as: PRILOSEC Take 1 capsule (10 mg total) by mouth as needed.   simvastatin 10 MG tablet Commonly known as: ZOCOR TAKE 1 TABLET BY MOUTH EVERY DAY IN THE EVENING       No results found for this or any previous visit (from the past 24 hour(s)). No results found.   ROS: Negative, with the exception of above mentioned in HPI   Objective:  BP 136/75 (BP Location: Left Arm, Patient Position: Sitting, Cuff Size: Normal) Comment: patient's cuff (automatic)  Pulse  72   Temp 98.7 F (37.1 C) (Temporal)   Ht 5' (1.524 m)   Wt 154 lb 9.6 oz (70.1 kg)   SpO2 96%   BMI 30.19 kg/m  Body mass index is 30.19 kg/m. Gen: Afebrile. No acute distress.  HENT: AT. [place].  Bilateral nares with erythema and drainage.  Areas of dried blood and irritation present.  Tender to palpation maxillary sinus.  No cough on exam.  No shortness of breath.  Mild hoarseness present.  Postnasal drip present. Eyes:Pupils Equal Round Reactive to light, Extraocular movements intact,  Conjunctiva without redness, discharge or icterus. Neck/lymp/endocrine: Supple, no thyromegaly CV: RRR, no edema, +2/4 P posterior tibialis pulses Chest: CTAB, no wheeze or crackles Abd: Soft. NTND. BS present Skin: no rashes, purpura or petechiae.  Neuro: Normal gait. PERLA. EOMi. Alert. Oriented x3 Psych: Normal affect, dress and demeanor. Normal speech. Normal thought content and judgment.  Assessment/Plan: Amy Rodriguez is a 86 y.o. female present for OV for follow-up on chronic medical conditions. SVT/TIA history/small MCA aneurysm/hypertension/hyperlipidemia/CVD/overweight: -Continue amlodipine 5 mg daily. -DC lisinopril 5 mg daily secondary to cough -Start losartan 12.5 mg daily. BP goal less than 130/80-continue to monitor. -Was on low-dose beta-blocker that has been discontinued secondary to decreased energy and low heart rate. - Continue 81 mg ASA.  - Continue simvastatin 10 mg QD  hypothyroidism tsh normal 09/2018. Continue to follow yearly chronic medical conditions appointments. - Continue levothyroxine 75 g daily  GERD:  Stable. Continue  Prilosec 10 mg daily PRN  Bacterial sinusitis: Rest, hydrate.  mucinex (DM if cough), nettie pot or nasal saline.  Discontinue Claritin-D Start Allegra daily Augmentin prescribed, take until completed.  If cough present it can last up to 6-8 weeks.  F/U 2 weeks of not improved.    Reviewed expectations re: course of  current medical  issues.  Discussed self-management of symptoms.  Outlined signs and symptoms indicating need for more acute intervention.  Patient verbalized understanding and all questions were answered.  Patient received an After-Visit Summary.    No orders of the defined types were placed in this encounter.  Meds ordered this encounter  Medications  . fexofenadine (ALLEGRA ALLERGY) 180 MG tablet    Sig: Take 1 tablet (180 mg total) by mouth daily.    Dispense:  90 tablet    Refill:  3  . amoxicillin-clavulanate (AUGMENTIN) 875-125 MG tablet    Sig: Take 1 tablet by mouth 2 (two) times daily.    Dispense:  20 tablet    Refill:  0  . DISCONTD: losartan (COZAAR) 25 MG tablet    Sig: Take 0.5 tablets (12.5 mg total) by mouth daily.    Dispense:  90 tablet    Refill:  1  . amLODipine (NORVASC) 5 MG tablet    Sig: Take 1 tablet (5 mg total) by mouth daily.    Dispense:  90 tablet    Refill:  1    DC prior dose scripts please  . losartan (COZAAR) 25 MG tablet    Sig: Take 0.5 tablets (12.5 mg total) by mouth daily.    Dispense:  45 tablet    Refill:  1    If you could, would you please cut these in half for her. She has trouble doing it herself.    electronically signed by:  Howard Pouch, DO  [place]

## 2019-09-16 ENCOUNTER — Other Ambulatory Visit: Payer: Self-pay

## 2019-09-16 ENCOUNTER — Ambulatory Visit (INDEPENDENT_AMBULATORY_CARE_PROVIDER_SITE_OTHER): Payer: Medicare Other | Admitting: Family Medicine

## 2019-09-16 ENCOUNTER — Encounter: Payer: Self-pay | Admitting: Family Medicine

## 2019-09-16 VITALS — BP 134/74 | HR 59 | Temp 97.3°F | Resp 18 | Ht 60.0 in | Wt 157.0 lb

## 2019-09-16 DIAGNOSIS — I679 Cerebrovascular disease, unspecified: Secondary | ICD-10-CM

## 2019-09-16 DIAGNOSIS — R5383 Other fatigue: Secondary | ICD-10-CM

## 2019-09-16 DIAGNOSIS — E538 Deficiency of other specified B group vitamins: Secondary | ICD-10-CM

## 2019-09-16 DIAGNOSIS — R7303 Prediabetes: Secondary | ICD-10-CM | POA: Diagnosis not present

## 2019-09-16 DIAGNOSIS — I10 Essential (primary) hypertension: Secondary | ICD-10-CM | POA: Diagnosis not present

## 2019-09-16 DIAGNOSIS — R0609 Other forms of dyspnea: Secondary | ICD-10-CM

## 2019-09-16 DIAGNOSIS — E038 Other specified hypothyroidism: Secondary | ICD-10-CM

## 2019-09-16 DIAGNOSIS — E559 Vitamin D deficiency, unspecified: Secondary | ICD-10-CM | POA: Diagnosis not present

## 2019-09-16 DIAGNOSIS — G459 Transient cerebral ischemic attack, unspecified: Secondary | ICD-10-CM

## 2019-09-16 DIAGNOSIS — E063 Autoimmune thyroiditis: Secondary | ICD-10-CM

## 2019-09-16 DIAGNOSIS — E7800 Pure hypercholesterolemia, unspecified: Secondary | ICD-10-CM | POA: Diagnosis not present

## 2019-09-16 DIAGNOSIS — I729 Aneurysm of unspecified site: Secondary | ICD-10-CM

## 2019-09-16 DIAGNOSIS — R600 Localized edema: Secondary | ICD-10-CM

## 2019-09-16 LAB — LIPID PANEL
Cholesterol: 138 mg/dL (ref 0–200)
HDL: 49.9 mg/dL (ref >39.00)
LDL Cholesterol: 70 mg/dL (ref 0–99)
NonHDL: 87.66
Total CHOL/HDL Ratio: 3
Triglycerides: 89 mg/dL (ref 0.0–149.0)
VLDL: 17.8 mg/dL (ref 0.0–40.0)

## 2019-09-16 LAB — COMPREHENSIVE METABOLIC PANEL
ALT: 10 U/L (ref 0–35)
AST: 16 U/L (ref 0–37)
Albumin: 4.5 g/dL (ref 3.5–5.2)
Alkaline Phosphatase: 77 U/L (ref 39–117)
BUN: 11 mg/dL (ref 6–23)
CO2: 29 mEq/L (ref 19–32)
Calcium: 9.1 mg/dL (ref 8.4–10.5)
Chloride: 98 mEq/L (ref 96–112)
Creatinine, Ser: 0.75 mg/dL (ref 0.40–1.20)
GFR: 73.65 mL/min (ref >60.00)
Glucose, Bld: 97 mg/dL (ref 70–99)
Potassium: 4.8 mEq/L (ref 3.5–5.1)
Sodium: 133 mEq/L — ABNORMAL LOW (ref 135–145)
Total Bilirubin: 0.4 mg/dL (ref 0.2–1.2)
Total Protein: 7.1 g/dL (ref 6.0–8.3)

## 2019-09-16 LAB — CBC WITH DIFFERENTIAL/PLATELET
Basophils Absolute: 0 10*3/uL (ref 0.0–0.1)
Basophils Relative: 0.5 % (ref 0.0–3.0)
Eosinophils Absolute: 0.4 10*3/uL (ref 0.0–0.7)
Eosinophils Relative: 6.9 % — ABNORMAL HIGH (ref 0.0–5.0)
HCT: 39.8 % (ref 36.0–46.0)
Hemoglobin: 13.3 g/dL (ref 12.0–15.0)
Lymphocytes Relative: 26.8 % (ref 12.0–46.0)
Lymphs Abs: 1.6 10*3/uL (ref 0.7–4.0)
MCHC: 33.6 g/dL (ref 30.0–36.0)
MCV: 83.7 fl (ref 78.0–100.0)
Monocytes Absolute: 0.8 10*3/uL (ref 0.1–1.0)
Monocytes Relative: 13.3 % — ABNORMAL HIGH (ref 3.0–12.0)
Neutro Abs: 3.2 10*3/uL (ref 1.4–7.7)
Neutrophils Relative %: 52.5 % (ref 43.0–77.0)
Platelets: 217 10*3/uL (ref 150.0–400.0)
RBC: 4.75 Mil/uL (ref 3.87–5.11)
RDW: 13.2 % (ref 11.5–15.5)
WBC: 6.1 10*3/uL (ref 4.0–10.5)

## 2019-09-16 LAB — TSH: TSH: 2.97 u[IU]/mL (ref 0.35–4.50)

## 2019-09-16 LAB — VITAMIN D 25 HYDROXY (VIT D DEFICIENCY, FRACTURES): VITD: 24.66 ng/mL — ABNORMAL LOW (ref 30.00–100.00)

## 2019-09-16 LAB — HEMOGLOBIN A1C: Hgb A1c MFr Bld: 6 % (ref 4.6–6.5)

## 2019-09-16 LAB — VITAMIN B12: Vitamin B-12: 171 pg/mL — ABNORMAL LOW (ref 211–911)

## 2019-09-16 MED ORDER — LOSARTAN POTASSIUM 25 MG PO TABS: 12.5000 mg | ORAL_TABLET | Freq: Every day | ORAL | 1 refills | Status: DC

## 2019-09-16 MED ORDER — OMEPRAZOLE 10 MG PO CPDR: 10.0000 mg | DELAYED_RELEASE_CAPSULE | ORAL | 3 refills | Status: DC | PRN

## 2019-09-16 MED ORDER — AMLODIPINE BESYLATE 5 MG PO TABS: 5.0000 mg | ORAL_TABLET | Freq: Every day | ORAL | 1 refills | Status: DC

## 2019-09-16 MED ORDER — LEVOTHYROXINE SODIUM 75 MCG PO TABS: 75.0000 ug | ORAL_TABLET | Freq: Every day | ORAL | 3 refills | Status: DC

## 2019-09-16 NOTE — Progress Notes (Signed)
Amy Rodriguez , Feb 16, 1934, 86 y.o., female MRN: [id number] Patient Care Team    Relationship Specialty Notifications Start End  Ma Hillock, DO PCP - General Family Medicine  01/08/15   Shon Hough, MD Consulting Physician Ophthalmology  08/03/15   Collene Gobble, MD Consulting Physician Pulmonary Disease  08/03/15   Lelon Perla, MD Consulting Physician Cardiology  08/03/15   Garvin Fila, MD Consulting Physician Neurology  08/03/15   Inda Castle, MD (Inactive) Consulting Physician Gastroenterology  08/04/15   Philemon Kingdom, MD Consulting Physician Internal Medicine  08/07/16   Melrose Nakayama, MD Consulting Physician Orthopedic Surgery  08/07/16     Chief Complaint  Patient presents with  . Hypertension    Pt states her BP runs good most days but has been low some days. She complains of fatigue and no energy.     Subjective: Amy Rodriguez is a 86 y.o. female present for [facility] follow up  Hypertension/hyperlipidemia/overweight: Patient reports compliance with amlodipine 5 mg daily and losartan.  Patient reports compliance with daily baby aspirin and Zocor 10 mg daily. Patient does have a small MCA aneurysm, SVT, history of stroke/tia and is followed by cardiology.  She had been on atenolol low-dose and this was discontinued recently by cardiology.  She also been tried on lisinopril but noticed a tickle cough and changed to losartan.  Patient denies chest pain, shortness of breath, dizziness or lower extremity edema.  -Labs up-to-date 09/27/2018  Hypothyroid: Patient reports compliance with levothyroxine 75 g daily.  09/27/2018 TSH 1.93.  GERD: Patient reports she takes Prilosec 10 mg as needed.  Medication works well.  Fatigue: Patient still endorses having significant fatigue.  She reports she does not feel rested even when first awakening.  She is uncertain if she snores.Body mass index is 30.66 kg/m.  She naturally carries a low heart rate and is mild  bradycardic.  She has a history of COPD but currently is not on inhalers.  She has been tried on Spiriva in the past and she is established with pulmonology.  Has a history of low vitamin D and B12 deficiency, she is not on any supplementation at this time.  He has a history of hypothyroidism and is compliant with her medications.  She is 49 and still works outside the home a great deal.  Depression screen [facility] 2/9 09/27/2018 08/14/2017 08/07/2016 08/03/2015 07/08/2014  Decreased Interest 0 0 0 0 0  Down, Depressed, Hopeless 0 0 0 0 0  PHQ - 2 Score 0 0 0 0 0  Some recent data might be hidden    Allergies  Allergen Reactions  . Benzonatate Nausea And Vomiting    "tessalon pearl"  . Sulfonamide Derivatives Itching and Rash  . Fosamax [Alendronate]   . Hctz [Hydrochlorothiazide] Other (See Comments)    Severe hyponatremia   . Lisinopril Cough   Social History   Tobacco Use  . Smoking status: Former Smoker    Packs/day: 1.00    Years: 25.00    Pack years: 25.00    Types: Cigarettes    Quit date: 04/17/1980    Years since quitting: 39.4  . Smokeless tobacco: Never Used  . Tobacco comment: passive smoker, husband smoked  Substance Use Topics  . Alcohol use: No   Past Medical History:  Diagnosis Date  . Arthritis    sed rate 10, RF, CCP pending  . Blood in stool   . Cervical cancer ([place])  s/p hysterectomy/oop  . DDD (degenerative disc disease), lumbosacral   . Depression    Due to husbands passing away 09/22/2011  . Diverticulosis of colon   . Environmental allergies   . GERD (gastroesophageal reflux disease)   . H pylori ulcer    not treated due to expense  10/2001  . Hand dermatitis   . HTN (hypertension)   . Hyperlipidemia   . Hypothyroidism    TSH 14.488 (11/2005)  . IBS (irritable bowel syndrome)   . Lactose intolerance   . Migraines    "get them very rarely now" (02/08/2012)  . Multiple pigmented nevi    last derm evaluation 01/2003  . OA (osteoarthritis)     multiple sites  . Other seborrheic keratosis   . Pain in joint, lower leg   . Pneumonia    "couple times in my lifetime" (02/08/2012)  . PONV (postoperative nausea and vomiting)    "and takes me a long time to come out under it" (02/08/2012)  . Posterior vitreous detachment 1996  . Postmenopausal    s/p hysterectomy for h/o cervical cancer 1982 (both ovaries taken at that time) now on hormonal replacement   . Postmenopausal HRT (hormone replacement therapy)   . PPD positive    history +PPD 1984, no treatment, no abnormal CXR  . Shortness of breath    with ambulation  . Stroke [facility])    TIA- April 2016 last one  . Supraventricular tachycardia, paroxysmal ([place])   . Urinary frequency   . Vaginal pruritus    Past Surgical History:  Procedure Laterality Date  . ABDOMINAL HYSTERECTOMY  1982  . CARDIOVASCULAR STRESS TEST  12/07/11   Normal nuclear stress test  . CATARACT EXTRACTION W/ INTRAOCULAR LENS IMPLANT  ?1997   right  . COLONOSCOPY    . EYE SURGERY     cataract removal right eye  . TOTAL ABDOMINAL HYSTERECTOMY W/ BILATERAL SALPINGOOPHORECTOMY  1982  . TOTAL KNEE ARTHROPLASTY  02/08/2012   Procedure: TOTAL KNEE ARTHROPLASTY;  Surgeon: Hessie Dibble, MD;  Location: [place];  Service: Orthopedics;  Laterality: Left;   Family History  Problem Relation Age of Onset  . Breast cancer Mother 12  . Lung cancer Brother 39  . Cancer Brother        lung  . Heart failure Father        congestive  . Heart disease Father   . Epilepsy Son   . Kidney disease Son        tuberous sclerosis, both kidneys removed, has transplant  . Diabetes Maternal Uncle   . Diabetes Paternal Aunt   . Cancer Maternal Grandmother        breast  . Breast cancer Maternal Grandmother   . Heart disease Son        [place]  . COPD Son   . Rectal cancer Maternal Aunt   . Colon cancer Neg Hx   . Esophageal cancer Neg Hx   . Stomach cancer Neg Hx    Allergies as of 09/16/2019      Reactions    Benzonatate Nausea And Vomiting   "tessalon pearl"   Sulfonamide Derivatives Itching, Rash   Fosamax [alendronate]    Hctz [hydrochlorothiazide] Other (See Comments)   Severe hyponatremia    Lisinopril Cough      Medication List       Accurate as of September 16, 2019 11:59 PM. If you have any questions, ask your nurse or doctor.  STOP taking these medications   amoxicillin-clavulanate 875-125 MG tablet Commonly known as: AUGMENTIN Stopped by: Howard Pouch, DO     TAKE these medications   albuterol 108 (90 Base) MCG/ACT inhaler Commonly known as: ProAir HFA Inhale 2 puffs into the lungs every 4 (four) hours as needed for wheezing or shortness of breath.   amLODipine 5 MG tablet Commonly known as: NORVASC Take 1 tablet (5 mg total) by mouth daily.   Cyanocobalamin 3000 MCG/ML Liqd Place 1 mL under the tongue daily. Started by: Howard Pouch, DO   diclofenac sodium 1 % Gel Commonly known as: Voltaren Apply 2 g topically as needed.   fexofenadine 180 MG tablet Commonly known as: Allegra Allergy Take 1 tablet (180 mg total) by mouth daily.   levothyroxine 75 MCG tablet Commonly known as: SYNTHROID Take 1 tablet (75 mcg total) by mouth daily before breakfast.   losartan 25 MG tablet Commonly known as: COZAAR Take 0.5 tablets (12.5 mg total) by mouth daily.   omeprazole 10 MG capsule Commonly known as: PRILOSEC Take 1 capsule (10 mg total) by mouth as needed.   simvastatin 10 MG tablet Commonly known as: ZOCOR TAKE 1 TABLET BY MOUTH EVERY DAY IN THE EVENING   Vitamin D3 20 MCG (800 UNIT) Tabs Take 1 tablet by mouth daily. Started by: Howard Pouch, DO       No results found for this or any previous visit (from the past 24 hour(s)). No results found.   ROS: Negative, with the exception of above mentioned in HPI   Objective:  BP 134/74 (BP Location: Left Arm, Patient Position: Sitting, Cuff Size: Normal)   Pulse (!) 59   Temp (!) 97.3 F (36.3 C)  (Temporal)   Resp 18   Ht 5' (1.524 m)   Wt 157 lb (71.2 kg)   SpO2 94%   BMI 30.66 kg/m  Body mass index is 30.66 kg/m. Gen: Afebrile. No acute distress.  HENT: AT. [place]. MMM Eyes:Pupils Equal Round Reactive to light, Extraocular movements intact,  Conjunctiva without redness, discharge or icterus. Neck/lymp/endocrine: Supple, no lymphadenopathy, no thyromegaly CV: RRR 1/6 SM, trace edema, +2/4 P posterior tibialis pulses Chest: CTAB, no wheeze or crackles Abd: Soft. NTND. BS present. Skin: no rashes, purpura or petechiae.  Neuro: Unchanged gait. PERLA. EOMi. Alert. Oriented. Marland Kitchen Psych: Normal affect, dress and demeanor. Normal speech. Normal thought content and judgment.   Assessment/Plan: EVALEIGH MCCAMY is a 86 y.o. female present for OV for follow-up on chronic medical conditions. SVT/TIA history/small MCA aneurysm/hypertension/hyperlipidemia/CVD/overweight/fatigue: -Continue amlodipine 5 mg daily. -DC lisinopril 5 mg daily secondary to cough -Continue losartan 12.5 mg daily. BP goal less than 130/80-continue to monitor.>  Home pressures are within normal range. -Was on low-dose beta-blocker that has been discontinued secondary to decreased energy and low heart rate. -Echocardiogram ordered secondary to lower extremity edema and fatigue. - Continue 81 mg ASA.  - Continue simvastatin 10 mg QD  hypothyroidism tsh normal 09/2018.  -We will recheck thyroid levels today since patient is still fatigued. -Continue levothyroxine 75 mcg daily if thyroid in normal range.  GERD:  Stable. Continue Prilosec 10 mg daily PRN  Fatigue/vitamin D deficiency/B12 deficiency: We will initiate work-up for fatigue.   CBC, CMP, TSH, A1c, vitamin D, B12 and iron collected today. Currently not on vitamin D or B12 with history of deficiencies may be possible cause of fatigue. Recheck thyroid levels today. Echocardiogram ordered to further evaluate heart function with edema and fatigue. We  discussed today her COPD could be the cause of her fatigue or OSA.  She is currently not on any inhalers and has never had a sleep study.  depending upon above results may need to see her pulmonologist as well.    Reviewed expectations re: course of current medical issues.  Discussed self-management of symptoms.  Outlined signs and symptoms indicating need for more acute intervention.  Patient verbalized understanding and all questions were answered.  Patient received an After-Visit Summary.    Orders Placed This Encounter  Procedures  . CBC w/Diff  . Comp Met (CMET)  . TSH  . Hemoglobin A1c  . Lipid panel  . Vitamin D (25 hydroxy)  . B12  . Iron, TIBC and Ferritin Panel  . ECHOCARDIOGRAM COMPLETE   Meds ordered this encounter  Medications  . omeprazole (PRILOSEC) 10 MG capsule    Sig: Take 1 capsule (10 mg total) by mouth as needed.    Dispense:  90 capsule    Refill:  3    Hold until pt requests.  Marland Kitchen losartan (COZAAR) 25 MG tablet    Sig: Take 0.5 tablets (12.5 mg total) by mouth daily.    Dispense:  45 tablet    Refill:  1    If you could, would you please cut these in half for her. She has trouble doing it herself.  Marland Kitchen amLODipine (NORVASC) 5 MG tablet    Sig: Take 1 tablet (5 mg total) by mouth daily.    Dispense:  90 tablet    Refill:  1    DC prior dose scripts please  . levothyroxine (SYNTHROID) 75 MCG tablet    Sig: Take 1 tablet (75 mcg total) by mouth daily before breakfast.    Dispense:  90 tablet    Refill:  3  . Cholecalciferol (VITAMIN D3) 20 MCG (800 UNIT) TABS    Sig: Take 1 tablet by mouth daily.    Dispense:  90 tablet    Refill:  1  . Cyanocobalamin 3000 MCG/ML LIQD    Sig: Place 1 mL under the tongue daily.    Dispense:  52 mL    Refill:  11    electronically signed by:  Howard Pouch, DO  [place]

## 2019-09-16 NOTE — Patient Instructions (Signed)
I have refilled your medications. No changes in meds today.  We will call you with lab results and guide you on follow up and further testing or referral.

## 2019-09-17 ENCOUNTER — Telehealth: Payer: Self-pay | Admitting: Family Medicine

## 2019-09-17 LAB — IRON,TIBC AND FERRITIN PANEL
%SAT: 19 % (calc) (ref 16–45)
Ferritin: 85 ng/mL (ref 16–288)
Iron: 70 ug/dL (ref 45–160)
TIBC: 373 mcg/dL (calc) (ref 250–450)

## 2019-09-17 MED ORDER — VITAMIN D3 20 MCG (800 UNIT) PO TABS: 1.0000 | ORAL_TABLET | Freq: Every day | ORAL | 1 refills | Status: DC

## 2019-09-17 MED ORDER — CYANOCOBALAMIN 3000 MCG/ML SL LIQD: 1.0000 mL | Freq: Every day | SUBLINGUAL | 11 refills | Status: DC

## 2019-09-17 NOTE — Telephone Encounter (Signed)
Pt was called and given lab results, she verbalized understanding. She will call with any problems or concerns at pharmacy. F/U appt was made.

## 2019-09-17 NOTE — Telephone Encounter (Signed)
Please inform patient the following information: Her liver, kidney and thyroid function is normal. Blood cell counts and electrolytes are normal. Cholesterol levels look great and are at goal. Iron levels are normal. Her A1c is 6.0 again.  But her fasting glucose is normal.  Still in the prediabetic range. Vitamin D levels are mildly insufficient at 24.  For optimal bone health that should be at least 30 and more ideally 40-50.  I have called in a once daily vitamin D supplementation she should take with food indefinitely to keep levels in normal range.  His vitamin D 800 units daily. Her B12 is dangerously low at 171.  This is a very likely cause of her fatigue and the tingling sensation in her feet.  She will need B12 supplement.  I have called in the sublingual liquid 3000 mcg daily for her to start immediately.   If insurance does not cover the B12 or the vitamin D she can purchase over-the-counter.  She will need the sublingual liquid B12 .  She will not absorb the B12 from a pill.  Her other option is to have routine B12 injections.  I would recommend she try the sublingual liquid first, if she is able.  If she is not able order is expensive, she is to call back in and we can set up B12 injections for her.  Follow-up in 8 weeks after starting B12 and vitamin D with provider, and we will retest her levels as well on this day.  I did also go ahead and order an echocardiogram.  They will be calling her to schedule.  I will call her with these results as well.

## 2019-09-22 ENCOUNTER — Telehealth: Payer: Self-pay

## 2019-09-22 NOTE — Telephone Encounter (Signed)
Patient called in stating that she has not being able to receive her B12 injection from the pharmacy. She is not getting any better if she can never get better if she can't receive her injection. Is there something Dr can do to help her ger the injection since pharmacy is having a hard time getting it in      Please call advise

## 2019-09-22 NOTE — Telephone Encounter (Signed)
Pt was called and message was left to return call about setting up B12 injections. Sent to Dr Raoul Pitch to advise

## 2019-09-22 NOTE — Telephone Encounter (Signed)
Patient will need every 2 weeks injection of B12 x6 doses. Schedule her for a nurse visit for the first 5 doses  provider visit for the sixth dose and we will recollect her levels prior to giving her her 6 dose that day.

## 2019-09-22 NOTE — Telephone Encounter (Signed)
Pt was called and she wants to start B12 injections. Please advice.

## 2019-09-24 ENCOUNTER — Ambulatory Visit (INDEPENDENT_AMBULATORY_CARE_PROVIDER_SITE_OTHER): Payer: Medicare Other

## 2019-09-24 ENCOUNTER — Other Ambulatory Visit: Payer: Self-pay

## 2019-09-24 DIAGNOSIS — E538 Deficiency of other specified B group vitamins: Secondary | ICD-10-CM

## 2019-09-24 MED ORDER — CYANOCOBALAMIN 1000 MCG/ML IJ SOLN
1000.0000 ug | Freq: Once | INTRAMUSCULAR | Status: AC
  Administered 2019-09-24: 1000 ug via INTRAMUSCULAR

## 2019-09-24 NOTE — Telephone Encounter (Signed)
Pt was called and scheduled for appt, had first inj today

## 2019-09-24 NOTE — Progress Notes (Addendum)
Amy Rodriguez is a 86 y.o. female presents to the office today for B12 injections, per physician's orders. Original order: Patient will need every 2 weeks injection of B12 x6 doses. Schedule her for a nurse visit for the first 5 doses  provider visit for the sixth dose and we will recollect her levels prior to giving her her 6 dose that day. B12, 72mL  IM was administered in the L deltoid today. Patient tolerated injection. Patient due for follow up labs/provider appt, No. Date due: 12/03/2019  , appt made no Patient next injection due: 10/08/2019, appt made yes  Caroll Rancher  LPN   Medical screening examination/treatment/procedure(s) were performed by non-physician practitioner and as supervising physician I was immediately available for consultation/collaboration.  I agree with above assessment and plan.  Electronically Signed by: Howard Pouch, DO [place] [place]

## 2019-10-07 ENCOUNTER — Ambulatory Visit (HOSPITAL_COMMUNITY)
Admission: RE | Admit: 2019-10-07 | Discharge: 2019-10-07 | Disposition: A | Discharge status: Home / Self Care | Payer: Medicare Other | Source: Ambulatory Visit | Attending: Family Medicine | Admitting: Family Medicine

## 2019-10-07 ENCOUNTER — Other Ambulatory Visit: Payer: Self-pay

## 2019-10-07 DIAGNOSIS — J449 Chronic obstructive pulmonary disease, unspecified: Secondary | ICD-10-CM | POA: Insufficient documentation

## 2019-10-07 DIAGNOSIS — R600 Localized edema: Secondary | ICD-10-CM

## 2019-10-07 DIAGNOSIS — R5383 Other fatigue: Secondary | ICD-10-CM | POA: Diagnosis not present

## 2019-10-07 DIAGNOSIS — I351 Nonrheumatic aortic (valve) insufficiency: Secondary | ICD-10-CM | POA: Insufficient documentation

## 2019-10-07 DIAGNOSIS — I679 Cerebrovascular disease, unspecified: Secondary | ICD-10-CM

## 2019-10-07 DIAGNOSIS — I10 Essential (primary) hypertension: Secondary | ICD-10-CM | POA: Diagnosis not present

## 2019-10-07 DIAGNOSIS — I729 Aneurysm of unspecified site: Secondary | ICD-10-CM | POA: Insufficient documentation

## 2019-10-07 DIAGNOSIS — E119 Type 2 diabetes mellitus without complications: Secondary | ICD-10-CM | POA: Insufficient documentation

## 2019-10-07 DIAGNOSIS — E785 Hyperlipidemia, unspecified: Secondary | ICD-10-CM | POA: Insufficient documentation

## 2019-10-07 DIAGNOSIS — R0609 Other forms of dyspnea: Secondary | ICD-10-CM | POA: Diagnosis not present

## 2019-10-07 NOTE — Progress Notes (Signed)
  Echocardiogram 2D Echocardiogram has been performed.  Jennette Dubin 10/07/2019, 1:47 PM

## 2019-10-08 ENCOUNTER — Other Ambulatory Visit: Payer: Self-pay

## 2019-10-08 ENCOUNTER — Ambulatory Visit (INDEPENDENT_AMBULATORY_CARE_PROVIDER_SITE_OTHER): Payer: Medicare Other

## 2019-10-08 DIAGNOSIS — E538 Deficiency of other specified B group vitamins: Secondary | ICD-10-CM

## 2019-10-08 MED ORDER — CYANOCOBALAMIN 1000 MCG/ML IJ SOLN
1000.0000 ug | Freq: Once | INTRAMUSCULAR | Status: AC
  Administered 2019-10-08: 1000 ug via INTRAMUSCULAR

## 2019-10-08 NOTE — Progress Notes (Addendum)
    Amy Rodriguez is a 86 y.o. female presents to the office today for B12 injections, per physician's orders. #2 out of 6 injections  Original order: Patient will need every 2 weeks injection of B12 x6 doses. Schedule her for a nurse visit for the first 5 doses provider visit for the sixth dose and we will recollect her levels prior to giving her her 6 dose that day. B12, 82mL  IM was administered in the L deltoid today. Patient tolerated injection. Patient due for follow up labs/provider appt, No. Date due: 12/03/2019  , appt made no Patient next injection due: 10/22/2019, appt made yes  Caroll Rancher  LPN   Medical screening examination/treatment/procedure(s) were performed by non-physician practitioner and as supervising physician I was immediately available for consultation/collaboration.  I agree with above assessment and plan.  Electronically Signed by: Howard Pouch, DO [place] [facility] [place]

## 2019-10-09 ENCOUNTER — Telehealth: Payer: Self-pay | Admitting: Family Medicine

## 2019-10-09 DIAGNOSIS — I5189 Other ill-defined heart diseases: Secondary | ICD-10-CM

## 2019-10-09 DIAGNOSIS — Q2112 Patent foramen ovale: Secondary | ICD-10-CM | POA: Insufficient documentation

## 2019-10-09 DIAGNOSIS — I422 Other hypertrophic cardiomyopathy: Secondary | ICD-10-CM | POA: Insufficient documentation

## 2019-10-09 NOTE — Telephone Encounter (Signed)
Patient was called today and provided with the results to her echocardiogram.  Patient presented to the office with edema and complaints of worsening fatigue. Echocardiogram was completed and is in the system. Briefly-echo consistent with apical hypertrophic cardiomyopathy, mild LVH, RV systolic mildly decreased with hypertrabeculation.  Trivial MR with annular calcification.  Mild TR, trivial AR and mild valve sclerosis without stenosis, trivial pulmonic regurg and atrial shunting possible PFO.  Patient has a history of TIA, hypertension, CVD, hyperlipidemia, venous stasis, aneurysm. Patient is taking a baby aspirin.  Referral placed back to cardiology.  She is established with Dr. Stanford Breed.

## 2019-10-15 ENCOUNTER — Other Ambulatory Visit (HOSPITAL_BASED_OUTPATIENT_CLINIC_OR_DEPARTMENT_OTHER): Payer: Medicare Other

## 2019-10-22 ENCOUNTER — Other Ambulatory Visit: Payer: Self-pay

## 2019-10-22 ENCOUNTER — Ambulatory Visit (INDEPENDENT_AMBULATORY_CARE_PROVIDER_SITE_OTHER): Payer: Medicare Other | Admitting: Family Medicine

## 2019-10-22 DIAGNOSIS — E538 Deficiency of other specified B group vitamins: Secondary | ICD-10-CM | POA: Diagnosis not present

## 2019-10-22 MED ORDER — CYANOCOBALAMIN 1000 MCG/ML IJ SOLN
1000.0000 ug | Freq: Once | INTRAMUSCULAR | Status: AC
  Administered 2019-10-22: 1000 ug via INTRAMUSCULAR

## 2019-10-22 NOTE — Progress Notes (Signed)
Amy Rodriguez is a 86 y.o. female presents to the office today for B12 injections, per physician's orders. #2 out of 6 injections  Original order: Patient will need every 2 weeks injection of B12 x6 doses.  Schedule her for a nurse visit for the first 5 doses provider visit for the sixth dose and we will recollect her levels prior to giving her her 6 dose that day.  #3 of 6 bi-weekly injections  B12, 31mL  IM was administered in the right deltoid today. Patient tolerated injection. Patient due for follow up labs/provider appt, No. Date due: 12/03/2019  , appt made no Patient next injection due: 11/12/2019, appt made no  Lattie Haw, CMA   Dr Anitra Lauth, please sign off in Dr Lucita Lora absence.   Thanks!

## 2019-10-23 DIAGNOSIS — H2512 Age-related nuclear cataract, left eye: Secondary | ICD-10-CM | POA: Diagnosis not present

## 2019-10-23 DIAGNOSIS — Z961 Presence of intraocular lens: Secondary | ICD-10-CM | POA: Diagnosis not present

## 2019-10-23 DIAGNOSIS — H5213 Myopia, bilateral: Secondary | ICD-10-CM | POA: Diagnosis not present

## 2019-10-23 DIAGNOSIS — H52203 Unspecified astigmatism, bilateral: Secondary | ICD-10-CM | POA: Diagnosis not present

## 2019-10-23 NOTE — Progress Notes (Deleted)
Office Visit Note  Patient: Amy Rodriguez             Date of Birth: 1933-06-04           MRN: [id number]             PCP: Ma Hillock, DO Referring: Ma Hillock, DO Visit Date: 11/06/2019 Occupation: @GUAROCC @  Subjective:  No chief complaint on file.   History of Present Illness: Amy Rodriguez is a 86 y.o. female ***   Activities of Daily Living:  Patient reports morning stiffness for *** {minute/hour:19697}.   Patient {ACTIONS;DENIES/REPORTS:21021675::"Denies"} nocturnal pain.  Difficulty dressing/grooming: {ACTIONS;DENIES/REPORTS:21021675::"Denies"} Difficulty climbing stairs: {ACTIONS;DENIES/REPORTS:21021675::"Denies"} Difficulty getting out of chair: {ACTIONS;DENIES/REPORTS:21021675::"Denies"} Difficulty using hands for taps, buttons, cutlery, and/or writing: {ACTIONS;DENIES/REPORTS:21021675::"Denies"}  No Rheumatology ROS completed.   PMFS History:  Patient Active Problem List   Diagnosis Date Noted  . PFO (patent foramen ovale) 10/09/2019  . Apical variant hypertrophic cardiomyopathy ([place]) 10/09/2019  . Chronic systolic dysfunction of right ventricle 10/09/2019  . Right thyroid enlargement 02/06/2019  . Overweight (BMI 25.0-29.9) 09/27/2018  . Bursitis of right hip 03/08/2018  . Aneurysm ([place]) 05/01/2016  . Osteopenia 08/03/2015  . Venous stasis 07/07/2015  . Emphysema lung ([place]) 01/08/2015  . B12 deficiency 07/08/2014  . Prediabetes 07/08/2014  . Hyperlipidemia 07/08/2014  . COPD (chronic obstructive pulmonary disease) ([place]) 04/29/2014  . 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 Cerebrovascular disease 08/21/2013  . TIA (transient ischemic attack) 07/25/2013  . Hypothyroidism 02/13/2012    Class: Chronic  . Vitamin D deficiency 06/28/2009  . HYPERTENSION, BENIGN ESSENTIAL 06/03/2008  . TACHYCARDIA, PAROXYSMAL SUPRAVENTRICULAR 06/14/2006  . Degenerative arthritis of knee, bilateral 06/14/2006  . DDD (degenerative  disc disease), lumbosacral 06/14/2006    Past Medical History:  Diagnosis Date  . Arthritis    sed rate 10, RF, CCP pending  . Blood in stool   . Cervical cancer ([place])    s/p hysterectomy/oop  . DDD (degenerative disc disease), lumbosacral   . Depression    Due to husbands passing away 09/22/2011  . Diverticulosis of colon   . Environmental allergies   . GERD (gastroesophageal reflux disease)   . H pylori ulcer    not treated due to expense  10/2001  . Hand dermatitis   . HTN (hypertension)   . Hyperlipidemia   . Hypothyroidism    TSH 14.488 (11/2005)  . IBS (irritable bowel syndrome)   . Lactose intolerance   . Migraines    "get them very rarely now" (02/08/2012)  . Multiple pigmented nevi    last derm evaluation 01/2003  . OA (osteoarthritis)    multiple sites  . Other seborrheic keratosis   . Pain in joint, lower leg   . Pneumonia    "couple times in my lifetime" (02/08/2012)  . PONV (postoperative nausea and vomiting)    "and takes me a long time to come out under it" (02/08/2012)  . Posterior vitreous detachment 1996  . Postmenopausal    s/p hysterectomy for h/o cervical cancer 1982 (both ovaries taken at that time) now on hormonal replacement   . Postmenopausal HRT (hormone replacement therapy)   . PPD positive    history +PPD 1984, no treatment, no abnormal CXR  . Shortness of breath    with ambulation  . Stroke [facility])    TIA- April 2016 last one  . Supraventricular tachycardia, paroxysmal ([place])   . Urinary frequency   . Vaginal pruritus  Family History  Problem Relation Age of Onset  . Breast cancer Mother 38  . Lung cancer Brother 32  . Cancer Brother        lung  . Heart failure Father        congestive  . Heart disease Father   . Epilepsy Son   . Kidney disease Son        tuberous sclerosis, both kidneys removed, has transplant  . Diabetes Maternal Uncle   . Diabetes Paternal Aunt   . Cancer Maternal Grandmother        breast  . Breast cancer  Maternal Grandmother   . Heart disease Son        [place] white  . COPD Son   . Rectal cancer Maternal Aunt   . Colon cancer Neg Hx   . Esophageal cancer Neg Hx   . Stomach cancer Neg Hx    Past Surgical History:  Procedure Laterality Date  . ABDOMINAL HYSTERECTOMY  1982  . CARDIOVASCULAR STRESS TEST  12/07/11   Normal nuclear stress test  . CATARACT EXTRACTION W/ INTRAOCULAR LENS IMPLANT  ?1997   right  . COLONOSCOPY    . EYE SURGERY     cataract removal right eye  . TOTAL ABDOMINAL HYSTERECTOMY W/ BILATERAL SALPINGOOPHORECTOMY  1982  . TOTAL KNEE ARTHROPLASTY  02/08/2012   Procedure: TOTAL KNEE ARTHROPLASTY;  Surgeon: Hessie Dibble, MD;  Location: [place];  Service: Orthopedics;  Laterality: Left;   Social History   Social History Narrative   Lives with son, [name] who has medical problems-they help each other. Her husband died 2022/10/15 after 27 years of marriage.  She has 2 other sons that live within 2 hrs from her. She does not drive-she uses [facility]. She used to work in [employer] research (medical), retired.     Tobacco: 25 pack yr hx, quit 1985.   Immunization History  Administered Date(s) Administered  . Fluad Quad(high Dose 65+) 02/06/2019  . Influenza Split 01/12/2011, 02/09/2012  . Influenza Whole 02/08/2007, 01/24/2008, 12/16/2008, 01/27/2009, 02/17/2010  . Influenza, High Dose Seasonal PF 01/16/2017, 01/25/2018  . Influenza,inj,Quad PF,6+ Mos 12/17/2012, 12/19/2013, 01/08/2015, 01/06/2016  . PFIZER SARS-COV-2 Vaccination 07/02/2019, 07/23/2019  . Pneumococcal Conjugate-13 07/08/2014  . Pneumococcal Polysaccharide-23 05/23/2004  . Td 06/16/2002  . Tdap 12/17/2012  . Zoster 12/17/2012     Objective: Vital Signs: There were no vitals taken for this visit.   Physical Exam   Musculoskeletal Exam: ***  CDAI Exam: CDAI Score: -- Patient Global: --; Provider Global: -- Swollen: --; Tender: -- Joint Exam 11/06/2019   No joint exam has been documented for this visit    There is currently no information documented on the homunculus. Go to the Rheumatology activity and complete the homunculus joint exam.  Investigation: No additional findings.  Imaging: ECHOCARDIOGRAM COMPLETE  Result Date: 10/07/2019    ECHOCARDIOGRAM REPORT   Patient Name:   ADDA STOKES Date of Exam: 10/07/2019 Medical Rec #:  [id number]       Height:       60.0 in Accession #:    [id number]      Weight:       157.0 lb Date of Birth:  1933-10-17        BSA:          1.684 m Patient Age:    30 years        BP:           134/74 mmHg Patient Gender:  F               HR:           57 bpm. Exam Location:  Outpatient Procedure: 2D Echo Indications:    Edema  History:        Patient has prior history of Echocardiogram examinations, most                 recent 07/26/2013. COPD; Risk Factors:Hypertension, Dyslipidemia                 and Diabetes.  Sonographer:    Mikki Santee RDCS (AE) Referring Phys: [id number] RENEE A [place]  1. Left ventricular ejection fraction, by estimation, is 65 to 70%. The left ventricle has normal function. The left ventricle has no regional wall motion abnormalities. There is mild left ventricular hypertrophy. Apical hypertrophy. Left ventricular diastolic parameters are indeterminate.  2. Right ventricular systolic function is mildly reduced. RV apex appears hypertrabeculated with decreased systolic function at apex. The right ventricular size is normal. There is normal pulmonary artery systolic pressure. The estimated right ventricular systolic pressure is 37.1 mmHg.  3. The mitral valve is degenerative. Moderate mitral annular calcification. Trivial mitral valve regurgitation.  4. The aortic valve is tricuspid. Aortic valve regurgitation is trivial. Mild aortic valve sclerosis is present, with no evidence of aortic valve stenosis.  5. Evidence of atrial level shunting detected by color flow Doppler. Possible small PFO, incompletely visualized  6. The inferior vena cava  is normal in size with greater than 50% respiratory variability, suggesting right atrial pressure of 3 mmHg.  7. LV apex appears hypertrophied, suggestive of apical hypertrophic cardiomyopathy (best seen on image 58). In addition, RV apex appears hypertrabeculated. Recommend cardiac MRI for further evaluation. FINDINGS  Left Ventricle: Left ventricular ejection fraction, by estimation, is 65 to 70%. The left ventricle has normal function. The left ventricle has no regional wall motion abnormalities. The left ventricular internal cavity size was normal in size. There is  mild left ventricular hypertrophy. Left ventricular diastolic parameters are indeterminate. Right Ventricle: The right ventricular size is normal. Right vetricular wall thickness was not assessed. Right ventricular systolic function is mildly reduced. There is normal pulmonary artery systolic pressure. The tricuspid regurgitant velocity is 2.51  m/s, and with an assumed right atrial pressure of 3 mmHg, the estimated right ventricular systolic pressure is 06.2 mmHg. Left Atrium: Left atrial size was normal in size. Right Atrium: Right atrial size was normal in size. Pericardium: There is no evidence of pericardial effusion. Mitral Valve: The mitral valve is degenerative in appearance. Moderate mitral annular calcification. Trivial mitral valve regurgitation. Tricuspid Valve: The tricuspid valve is normal in structure. Tricuspid valve regurgitation is mild. Aortic Valve: The aortic valve is tricuspid. Aortic valve regurgitation is trivial. Mild aortic valve sclerosis is present, with no evidence of aortic valve stenosis. Pulmonic Valve: The pulmonic valve was not well visualized. Pulmonic valve regurgitation is trivial. Aorta: The aortic root is normal in size and structure. Venous: The inferior vena cava is normal in size with greater than 50% respiratory variability, suggesting right atrial pressure of 3 mmHg. IAS/Shunts: Evidence of atrial level  shunting detected by color flow Doppler.  LEFT VENTRICLE PLAX 2D LVIDd:         5.00 cm LVIDs:         2.80 cm LV PW:         1.00 cm LV IVS:  1.10 cm LVOT diam:     2.20 cm LV SV:         51 LV SV Index:   30 LVOT Area:     3.80 cm  RIGHT VENTRICLE RV S prime:     11.00 cm/s TAPSE (M-mode): 1.8 cm LEFT ATRIUM             Index       RIGHT ATRIUM           Index LA diam:        4.50 cm 2.67 cm/m  RA Area:     13.10 cm LA Vol (A2C):   48.3 ml 28.68 ml/m RA Volume:   26.60 ml  15.79 ml/m LA Vol (A4C):   27.5 ml 16.33 ml/m LA Biplane Vol: 36.6 ml 21.73 ml/m  AORTIC VALVE LVOT Vmax:   69.20 cm/s LVOT Vmean:  41.600 cm/s LVOT VTI:    0.133 m  AORTA Ao Root diam: 3.20 cm MITRAL VALVE               TRICUSPID VALVE MV Area (PHT): 2.50 cm    TR Peak grad:   25.2 mmHg MV Decel Time: 303 msec    TR Vmax:        251.00 cm/s MV E velocity: 66.40 cm/s MV A velocity: 94.70 cm/s  SHUNTS MV E/A ratio:  0.70        Systemic VTI:  0.13 m                            Systemic Diam: 2.20 cm Oswaldo Milian MD Electronically signed by Oswaldo Milian MD Signature Date/Time: 10/07/2019/9:38:30 PM    Final     Recent Labs: Lab Results  Component Value Date   WBC 6.1 09/16/2019   HGB 13.3 09/16/2019   PLT 217.0 09/16/2019   NA 133 (L) 09/16/2019   K 4.8 09/16/2019   CL 98 09/16/2019   CO2 29 09/16/2019   GLUCOSE 97 09/16/2019   BUN 11 09/16/2019   CREATININE 0.75 09/16/2019   BILITOT 0.4 09/16/2019   ALKPHOS 77 09/16/2019   AST 16 09/16/2019   ALT 10 09/16/2019   PROT 7.1 09/16/2019   ALBUMIN 4.5 09/16/2019   CALCIUM 9.1 09/16/2019   GFRAA 76 08/03/2016    Speciality Comments: No specialty comments available.  Procedures:  No procedures performed Allergies: Benzonatate, Sulfonamide derivatives, Fosamax [alendronate], Hctz [hydrochlorothiazide], and Lisinopril   Assessment / Plan:     Visit Diagnoses: No diagnosis found.  Orders: No orders of the defined types were placed in this  encounter.  No orders of the defined types were placed in this encounter.   Face-to-face time spent with patient was *** minutes. Greater than 50% of time was spent in counseling and coordination of care.  Follow-Up Instructions: No follow-ups on file.   Earnestine Mealing, CMA  Note - This record has been created using Editor, commissioning.  Chart creation errors have been sought, but may not always  have been located. Such creation errors do not reflect on  the standard of medical care.

## 2019-11-03 NOTE — Progress Notes (Signed)
Pt with vit B12 deficiency.  Agree with vit B12 1000 mcg IM in office today. Signed:  Phil Rhoda Waldvogel, MD           11/03/2019  

## 2019-11-05 ENCOUNTER — Ambulatory Visit (INDEPENDENT_AMBULATORY_CARE_PROVIDER_SITE_OTHER): Payer: Medicare Other | Admitting: Family Medicine

## 2019-11-05 ENCOUNTER — Other Ambulatory Visit: Payer: Self-pay

## 2019-11-05 DIAGNOSIS — E538 Deficiency of other specified B group vitamins: Secondary | ICD-10-CM | POA: Diagnosis not present

## 2019-11-05 MED ORDER — CYANOCOBALAMIN 1000 MCG/ML IJ SOLN
1000.0000 ug | Freq: Once | INTRAMUSCULAR | Status: AC
  Administered 2019-11-05: 1000 ug via INTRAMUSCULAR

## 2019-11-05 NOTE — Progress Notes (Signed)
FLORDIA KASSEM is a 86 y.o. female presents to the office today for B12 injections, per physician's orders.#2 out of 6 injections Original order: Patient will need every 2 weeks injection of B12 x6 doses.  Schedule her for a nurse visit for the first 5 doses provider visit for the sixth dose and we will recollect her levels prior to giving her her 6 dose that day.  #4 of 6 bi-weekly injections   B12, 65mL IM was administered in the left deltoid today. Patient tolerated injection. Patient due for follow up labs/provider appt, No. Date due: 12/03/2019 , appt made no Patient next injection due: 11/19/2019, appt made no  Lattie Haw, [place]

## 2019-11-06 ENCOUNTER — Ambulatory Visit: Payer: Self-pay | Admitting: Rheumatology

## 2019-11-14 ENCOUNTER — Other Ambulatory Visit: Payer: Self-pay

## 2019-11-14 ENCOUNTER — Encounter: Payer: Self-pay | Admitting: Family Medicine

## 2019-11-14 ENCOUNTER — Ambulatory Visit (INDEPENDENT_AMBULATORY_CARE_PROVIDER_SITE_OTHER): Payer: Medicare Other | Admitting: Family Medicine

## 2019-11-14 VITALS — BP 123/78 | HR 59 | Temp 98.1°F | Resp 16 | Ht 60.0 in | Wt 154.4 lb

## 2019-11-14 DIAGNOSIS — E559 Vitamin D deficiency, unspecified: Secondary | ICD-10-CM

## 2019-11-14 DIAGNOSIS — E538 Deficiency of other specified B group vitamins: Secondary | ICD-10-CM | POA: Diagnosis not present

## 2019-11-14 MED ORDER — CYANOCOBALAMIN 1000 MCG/ML IJ SOLN
1000.0000 ug | Freq: Once | INTRAMUSCULAR | Status: AC
  Administered 2019-11-14: 1000 ug via INTRAMUSCULAR

## 2019-11-14 NOTE — Patient Instructions (Signed)
Great to see you today.  We will call you with results and guide you on  b12 and vit d doses.

## 2019-11-14 NOTE — Progress Notes (Signed)
This visit occurred during the SARS-CoV-2 public health emergency.  Safety protocols were in place, including screening questions prior to the visit, additional usage of staff PPE, and extensive cleaning of exam room while observing appropriate contact time as indicated for disinfecting solutions.    Amy Rodriguez , 1933/08/13, 86 y.o., female MRN: [id number] Patient Care Team    Relationship Specialty Notifications Start End  Ma Hillock, DO PCP - General Family Medicine  01/08/15   Shon Hough, MD Consulting Physician Ophthalmology  08/03/15   Collene Gobble, MD Consulting Physician Pulmonary Disease  08/03/15   Lelon Perla, MD Consulting Physician Cardiology  08/03/15   Garvin Fila, MD Consulting Physician Neurology  08/03/15   Inda Castle, MD (Inactive) Consulting Physician Gastroenterology  08/04/15   Philemon Kingdom, MD Consulting Physician Internal Medicine  08/07/16   Melrose Nakayama, MD Consulting Physician Orthopedic Surgery  08/07/16     Chief Complaint  Patient presents with  . B12 Deficiency     Subjective: Pt presents for an OV for follow up on her b12 and vit d deficiencies. She reports she has a nurse friend that is willing to administer her b12 at home if able.   Depression screen [facility] 2/9 09/27/2018 08/14/2017 08/07/2016 08/03/2015 07/08/2014  Decreased Interest 0 0 0 0 0  Down, Depressed, Hopeless 0 0 0 0 0  PHQ - 2 Score 0 0 0 0 0  Some recent data might be hidden    Allergies  Allergen Reactions  . Benzonatate Nausea And Vomiting    "tessalon pearl"  . Sulfonamide Derivatives Itching and Rash  . Fosamax [Alendronate]   . Hctz [Hydrochlorothiazide] Other (See Comments)    Severe hyponatremia   . Lisinopril Cough   Social History   Social History Narrative   Lives with son, [name] who has medical problems-they help each other. Her husband died October 09, 2022 after 30 years of marriage.  She has 2 other sons that live within 2 hrs from her.  She does not drive-she uses [facility]. She used to work in [employer] (medical), retired.     Tobacco: 25 pack yr hx, quit 1985.   Past Medical History:  Diagnosis Date  . Arthritis    sed rate 10, RF, CCP pending  . Blood in stool   . Cervical cancer ([place])    s/p hysterectomy/oop  . DDD (degenerative disc disease), lumbosacral   . Depression    Due to husbands passing away 09/22/2011  . Diverticulosis of colon   . Environmental allergies   . GERD (gastroesophageal reflux disease)   . H pylori ulcer    not treated due to expense  10/2001  . Hand dermatitis   . HTN (hypertension)   . Hyperlipidemia   . Hypothyroidism    TSH 14.488 (11/2005)  . IBS (irritable bowel syndrome)   . Lactose intolerance   . Migraines    "get them very rarely now" (02/08/2012)  . Multiple pigmented nevi    last derm evaluation 01/2003  . OA (osteoarthritis)    multiple sites  . Other seborrheic keratosis   . Pain in joint, lower leg   . Pneumonia    "couple times in my lifetime" (02/08/2012)  . PONV (postoperative nausea and vomiting)    "and takes me a long time to come out under it" (02/08/2012)  . Posterior vitreous detachment 1996  . Postmenopausal    s/p hysterectomy for h/o cervical cancer 1982 (both  ovaries taken at that time) now on hormonal replacement   . Postmenopausal HRT (hormone replacement therapy)   . PPD positive    history +PPD 1984, no treatment, no abnormal CXR  . Shortness of breath    with ambulation  . Stroke [facility])    TIA- April 2016 last one  . Supraventricular tachycardia, paroxysmal ([place])   . Urinary frequency   . Vaginal pruritus    Past Surgical History:  Procedure Laterality Date  . ABDOMINAL HYSTERECTOMY  1982  . CARDIOVASCULAR STRESS TEST  12/07/11   Normal nuclear stress test  . CATARACT EXTRACTION W/ INTRAOCULAR LENS IMPLANT  ?1997   right  . COLONOSCOPY    . EYE SURGERY     cataract removal right eye  . TOTAL ABDOMINAL HYSTERECTOMY W/ BILATERAL  SALPINGOOPHORECTOMY  1982  . TOTAL KNEE ARTHROPLASTY  02/08/2012   Procedure: TOTAL KNEE ARTHROPLASTY;  Surgeon: Hessie Dibble, MD;  Location: [place];  Service: Orthopedics;  Laterality: Left;   Family History  Problem Relation Age of Onset  . Breast cancer Mother 65  . Lung cancer Brother 74  . Cancer Brother        lung  . Heart failure Father        congestive  . Heart disease Father   . Epilepsy Son   . Kidney disease Son        tuberous sclerosis, both kidneys removed, has transplant  . Diabetes Maternal Uncle   . Diabetes Paternal Aunt   . Cancer Maternal Grandmother        breast  . Breast cancer Maternal Grandmother   . Heart disease Son        [place] white  . COPD Son   . Rectal cancer Maternal Aunt   . Colon cancer Neg Hx   . Esophageal cancer Neg Hx   . Stomach cancer Neg Hx    Allergies as of 11/14/2019      Reactions   Benzonatate Nausea And Vomiting   "tessalon pearl"   Sulfonamide Derivatives Itching, Rash   Fosamax [alendronate]    Hctz [hydrochlorothiazide] Other (See Comments)   Severe hyponatremia    Lisinopril Cough      Medication List       Accurate as of November 14, 2019  1:18 PM. If you have any questions, ask your nurse or doctor.        STOP taking these medications   Cyanocobalamin 3000 MCG/ML Liqd Stopped by: Howard Pouch, DO     TAKE these medications   albuterol 108 (90 Base) MCG/ACT inhaler Commonly known as: ProAir HFA Inhale 2 puffs into the lungs every 4 (four) hours as needed for wheezing or shortness of breath.   amLODipine 5 MG tablet Commonly known as: NORVASC Take 1 tablet (5 mg total) by mouth daily.   aspirin EC 81 MG tablet Take 81 mg by mouth daily. Swallow whole.   diclofenac sodium 1 % Gel Commonly known as: Voltaren Apply 2 g topically as needed.   fexofenadine 180 MG tablet Commonly known as: Allegra Allergy Take 1 tablet (180 mg total) by mouth daily.   levothyroxine 75 MCG tablet Commonly known  as: SYNTHROID Take 1 tablet (75 mcg total) by mouth daily before breakfast.   losartan 25 MG tablet Commonly known as: COZAAR Take 0.5 tablets (12.5 mg total) by mouth daily.   omeprazole 10 MG capsule Commonly known as: PRILOSEC Take 1 capsule (10 mg total) by mouth as needed. What  changed: additional instructions   simvastatin 10 MG tablet Commonly known as: ZOCOR TAKE 1 TABLET BY MOUTH EVERY DAY IN THE EVENING   Vitamin D3 20 MCG (800 UNIT) Tabs Take 1 tablet by mouth daily.       All past medical history, surgical history, allergies, family history, immunizations andmedications were updated in the EMR today and reviewed under the history and medication portions of their EMR.     ROS: Negative, with the exception of above mentioned in HPI   Objective:  BP 123/78 (BP Location: Right Arm, Patient Position: Sitting, Cuff Size: Normal)   Pulse 59   Temp 98.1 F (36.7 C) (Temporal)   Resp 16   Ht 5' (1.524 m)   Wt 154 lb 6 oz (70 kg)   SpO2 96%   BMI 30.15 kg/m  Body mass index is 30.15 kg/m. Gen: Afebrile. No acute distress. Nontoxic in appearance, well developed, well nourished.  HENT: AT. [place].  Eyes:Pupils Equal Round Reactive to light, Extraocular movements intact,  Conjunctiva without redness, discharge or icterus. Psych: Normal affect, dress and demeanor. Normal speech. Normal thought content and judgment.  No exam data present No results found. No results found for this or any previous visit (from the past 24 hour(s)).  Assessment/Plan: Amy Rodriguez is a 86 y.o. female present for OV for  B12 deficiency She has x4 injections of B12 at 2 weeks intervals. She does feel improved after starting injections.  Depending upon results of labs will provided - B12  Vitamin D deficiency She realized she is only taking 400u of vit d. She will increase to 800 units today.  - Vitamin D (25 hydroxy)   Reviewed expectations re: course of current medical  issues.  Discussed self-management of symptoms.  Outlined signs and symptoms indicating need for more acute intervention.  Patient verbalized understanding and all questions were answered.  Patient received an After-Visit Summary.    Orders Placed This Encounter  Procedures  . B12  . Vitamin D (25 hydroxy)   No orders of the defined types were placed in this encounter.  Referral Orders  No referral(s) requested today    Note is dictated utilizing voice recognition software. Although note has been proof read prior to signing, occasional typographical errors still can be missed. If any questions arise, please do not hesitate to call for verification.   electronically signed by:  Howard Pouch, DO  [place]

## 2019-11-15 LAB — VITAMIN B12: Vitamin B-12: 675 pg/mL (ref 200–1100)

## 2019-11-15 LAB — VITAMIN D 25 HYDROXY (VIT D DEFICIENCY, FRACTURES): Vit D, 25-Hydroxy: 22 ng/mL — ABNORMAL LOW (ref 30–100)

## 2019-11-17 ENCOUNTER — Telehealth: Payer: Self-pay | Admitting: Family Medicine

## 2019-11-17 MED ORDER — CYANOCOBALAMIN 1000 MCG/ML IJ KIT: 1.0000 mL | PACK | INTRAMUSCULAR | 11 refills | Status: DC

## 2019-11-17 NOTE — Telephone Encounter (Signed)
Please inform patient the following information: Her vitamin D levels are still low at 22.  I would encourage her to increase her vitamin D to 1000 units daily with a meal.  Her B12 levels look great at 675.  I have called into her pharmacy B12 injections and supplies for her so that she can start having her B12 injections completed at home per her request.  I would recommend she continue the every 2 weeks B12 injections in order to keep her levels up where they are. We will keep track of her B12 levels yearly with other labs.  Thanks.

## 2019-11-17 NOTE — Telephone Encounter (Signed)
Spoke with pharmacy and they state that insurance will not cover the medication. They have applied a discount card and the cost is now 22.75 for a 90 day supply.   Patient informed and verbalized understanding.

## 2019-11-17 NOTE — Telephone Encounter (Signed)
Patient informed and verbalized understanding.  Patient states that her insurance will not cover the b12 injections. Will contact the pharmacy to see if they need a PA.

## 2019-12-16 NOTE — Progress Notes (Signed)
HPI: FU SVT. Nuclear study in August of 2013 showed an ejection fraction of 76% and normal perfusion. Holter 12/14 showed sinus with pacs, pvcs and rare couplet. Carotid dopplers8/18 showed 1-39 bilateral stenosis.  Echocardiogram June 2021 showed normal LV function, mild left ventricular hypertrophy, mild RV dysfunction, trace aortic insufficiency and possible small PFO.  There was also question of apical hypertrophic cardiomyopathy and hypertrabeculation of the RV apex.  Since last seen, she has some fatigue.  There is no dyspnea, chest pain, palpitations or syncope.  Minimal pedal edema.  Current Outpatient Medications  Medication Sig Dispense Refill  . albuterol (PROAIR HFA) 108 (90 Base) MCG/ACT inhaler Inhale 2 puffs into the lungs every 4 (four) hours as needed for wheezing or shortness of breath. 1 Inhaler 3  . amLODipine (NORVASC) 5 MG tablet Take 1 tablet (5 mg total) by mouth daily. 90 tablet 1  . aspirin EC 81 MG tablet Take 81 mg by mouth daily. Swallow whole.    . Cholecalciferol (VITAMIN D3) 20 MCG (800 UNIT) TABS Take 1 tablet by mouth daily. 90 tablet 1  . Cyanocobalamin 1000 MCG/ML KIT Inject 1 mL as directed every 14 (fourteen) days. 2 kit 11  . diclofenac sodium (VOLTAREN) 1 % GEL Apply 2 g topically as needed. 100 g 11  . fexofenadine (ALLEGRA ALLERGY) 180 MG tablet Take 1 tablet (180 mg total) by mouth daily. 90 tablet 3  . levothyroxine (SYNTHROID) 75 MCG tablet Take 1 tablet (75 mcg total) by mouth daily before breakfast. 90 tablet 3  . losartan (COZAAR) 25 MG tablet Take 0.5 tablets (12.5 mg total) by mouth daily. 45 tablet 1  . omeprazole (PRILOSEC) 10 MG capsule Take 1 capsule (10 mg total) by mouth as needed. (Patient taking differently: Take 10 mg by mouth as needed. PRN) 90 capsule 3  . simvastatin (ZOCOR) 10 MG tablet TAKE 1 TABLET BY MOUTH EVERY DAY IN THE EVENING 90 tablet 3   No current facility-administered medications for this visit.     Past Medical  History:  Diagnosis Date  . Arthritis    sed rate 10, RF, CCP pending  . Blood in stool   . Cervical cancer ([place])    s/p hysterectomy/oop  . DDD (degenerative disc disease), lumbosacral   . Depression    Due to husbands passing away 09/22/2011  . Diverticulosis of colon   . Environmental allergies   . GERD (gastroesophageal reflux disease)   . H pylori ulcer    not treated due to expense  10/2001  . Hand dermatitis   . HTN (hypertension)   . Hyperlipidemia   . Hypothyroidism    TSH 14.488 (11/2005)  . IBS (irritable bowel syndrome)   . Lactose intolerance   . Migraines    "get them very rarely now" (02/08/2012)  . Multiple pigmented nevi    last derm evaluation 01/2003  . OA (osteoarthritis)    multiple sites  . Other seborrheic keratosis   . Pain in joint, lower leg   . Pneumonia    "couple times in my lifetime" (02/08/2012)  . PONV (postoperative nausea and vomiting)    "and takes me a long time to come out under it" (02/08/2012)  . Posterior vitreous detachment 1996  . Postmenopausal    s/p hysterectomy for h/o cervical cancer 1982 (both ovaries taken at that time) now on hormonal replacement   . Postmenopausal HRT (hormone replacement therapy)   . PPD positive  history +PPD 1984, no treatment, no abnormal CXR  . Shortness of breath    with ambulation  . Stroke [facility])    TIA- April 2016 last one  . Supraventricular tachycardia, paroxysmal ([place])   . Urinary frequency   . Vaginal pruritus     Past Surgical History:  Procedure Laterality Date  . ABDOMINAL HYSTERECTOMY  1982  . CARDIOVASCULAR STRESS TEST  12/07/11   Normal nuclear stress test  . CATARACT EXTRACTION W/ INTRAOCULAR LENS IMPLANT  ?1997   right  . COLONOSCOPY    . EYE SURGERY     cataract removal right eye  . TOTAL ABDOMINAL HYSTERECTOMY W/ BILATERAL SALPINGOOPHORECTOMY  1982  . TOTAL KNEE ARTHROPLASTY  02/08/2012   Procedure: TOTAL KNEE ARTHROPLASTY;  Surgeon: Hessie Dibble, MD;  Location: [place];  Service: Orthopedics;  Laterality: Left;    Social History   Socioeconomic History  . Marital status: Widowed    Spouse name: Not on file  . Number of children: 3  . Years of education: 12TH  . Highest education level: Not on file  Occupational History  . Occupation: retired    Comment: Academic librarian: RETIRED  Tobacco Use  . Smoking status: Former Smoker    Packs/day: 1.00    Years: 25.00    Pack years: 25.00    Types: Cigarettes    Quit date: 04/17/1980    Years since quitting: 39.6  . Smokeless tobacco: Never Used  . Tobacco comment: passive smoker, husband smoked  Vaping Use  . Vaping Use: Never used  Substance and Sexual Activity  . Alcohol use: No  . Drug use: No  . Sexual activity: Never  Other Topics Concern  . Not on file  Social History Narrative   Lives with son, [name] who has medical problems-they help each other. Her husband died Oct 14, 2022 after 26 years of marriage.  She has 2 other sons that live within 2 hrs from her. She does not drive-she uses [place] [facility]. She used to work in Scientist, research (medical), retired.     Tobacco: 25 pack yr hx, quit 1985.   Social Determinants of Health   Financial Resource Strain:   . Difficulty of Paying Living Expenses: Not on file  Food Insecurity:   . Worried About Charity fundraiser in the Last Year: Not on file  . Ran Out of Food in the Last Year: Not on file  Transportation Needs:   . Lack of Transportation (Medical): Not on file  . Lack of Transportation (Non-Medical): Not on file  Physical Activity:   . Days of Exercise per Week: Not on file  . Minutes of Exercise per Session: Not on file  Stress:   . Feeling of Stress : Not on file  Social Connections:   . Frequency of Communication with Friends and Family: Not on file  . Frequency of Social Gatherings with Friends and Family: Not on file  . Attends Religious Services: Not on file  . Active Member of Clubs or Organizations: Not on file  . Attends Theatre manager Meetings: Not on file  . Marital Status: Not on file  Intimate Partner Violence:   . Fear of Current or Ex-Partner: Not on file  . Emotionally Abused: Not on file  . Physically Abused: Not on file  . Sexually Abused: Not on file    Family History  Problem Relation Age of Onset  . Breast cancer Mother 82  . Lung cancer  Brother 22  . Cancer Brother        lung  . Heart failure Father        congestive  . Heart disease Father   . Epilepsy Son   . Kidney disease Son        tuberous sclerosis, both kidneys removed, has transplant  . Diabetes Maternal Uncle   . Diabetes Paternal Aunt   . Cancer Maternal Grandmother        breast  . Breast cancer Maternal Grandmother   . Heart disease Son        [place]  . COPD Son   . Rectal cancer Maternal Aunt   . Colon cancer Neg Hx   . Esophageal cancer Neg Hx   . Stomach cancer Neg Hx     ROS: no fevers or chills, productive cough, hemoptysis, dysphasia, odynophagia, melena, hematochezia, dysuria, hematuria, rash, seizure activity, orthopnea, PND, claudication. Remaining systems are negative.  Physical Exam: Well-developed well-nourished in no acute distress.  Skin is warm and dry.  HEENT is normal.  Neck is supple.  Chest is clear to auscultation with normal expansion.  Cardiovascular exam is regular rate and rhythm.  Abdominal exam nontender or distended. No masses palpated. Extremities show no edema. neuro grossly intact  ECG-sinus rhythm at a rate of 64, right bundle branch block.  Personally reviewed  A/P  1 history of supraventricular tachycardia-patient has had no recurrences.  Beta-blocker discontinued due to bradycardia.  2 hypertension-patient's blood pressure is controlled.  Continue present medications.  3 hyperlipidemia-continue statin.  4 carotid artery disease-mild on most recent carotid Dopplers.  Continue medical therapy with aspirin and statin.  5 lower extremity edema-continue feet  elevation and compression hose.  Kirk Ruths, MD

## 2019-12-18 ENCOUNTER — Other Ambulatory Visit: Payer: Self-pay

## 2019-12-18 ENCOUNTER — Ambulatory Visit (INDEPENDENT_AMBULATORY_CARE_PROVIDER_SITE_OTHER): Payer: Medicare Other | Admitting: Cardiology

## 2019-12-18 ENCOUNTER — Other Ambulatory Visit: Payer: Self-pay | Admitting: General Practice

## 2019-12-18 ENCOUNTER — Encounter: Payer: Self-pay | Admitting: Cardiology

## 2019-12-18 VITALS — BP 132/70 | HR 64 | Ht 60.0 in | Wt 156.0 lb

## 2019-12-18 DIAGNOSIS — I10 Essential (primary) hypertension: Secondary | ICD-10-CM

## 2019-12-18 DIAGNOSIS — E785 Hyperlipidemia, unspecified: Secondary | ICD-10-CM

## 2019-12-18 DIAGNOSIS — I471 Supraventricular tachycardia: Secondary | ICD-10-CM | POA: Diagnosis not present

## 2019-12-18 NOTE — Patient Instructions (Signed)
Medication Instructions:  NO CHANGE *If you need a refill on your cardiac medications before your next appointment, please call your pharmacy*   Follow-Up: At [facility], you and your health needs are our priority.  As part of our continuing mission to provide you with exceptional heart care, we have created designated Provider Care Teams.  These Care Teams include your primary Cardiologist (physician) and Advanced Practice Providers (APPs -  Physician Assistants and Nurse Practitioners) who all work together to provide you with the care you need, when you need it.  We recommend signing up for the patient portal called "MyChart".  Sign up information is provided on this After Visit Summary.  MyChart is used to connect with patients for Virtual Visits (Telemedicine).  Patients are able to view lab/test results, encounter notes, upcoming appointments, etc.  Non-urgent messages can be sent to your provider as well.   To learn more about what you can do with MyChart, go to [email or web address].    Your next appointment:   12 month(s)  The format for your next appointment:   In Person  Provider:   You may see Kirk Ruths MD or one of the following Advanced Practice Providers on your designated Care Team:    Kerin Ransom, PA-C  [place], [place]  Coletta Memos, [place]

## 2019-12-25 NOTE — Addendum Note (Signed)
Addended by: Zebedee Iba on: 12/25/2019 02:01 PM   Modules accepted: Orders

## 2019-12-31 ENCOUNTER — Ambulatory Visit: Payer: Medicare Other | Admitting: Rheumatology

## 2020-01-20 NOTE — Progress Notes (Signed)
Office Visit Note  Patient: Amy Rodriguez             Date of Birth: [birth year]           MRN: [id number]             PCP: Ma Hillock, DO Referring: Ma Hillock, DO Visit Date: 02/03/2020 Occupation: @GUAROCC @  Subjective:  Osteopenia and osteoarthritis   History of Present Illness: Amy Rodriguez is a 86 y.o. female with history of osteopenia and osteoarthritis.  She states about 2 months ago she fell and landed on her bottom.  She states she also supported herself with her wrists.  She did not require any fractures.  She is gradually recovering from that.  She continues to have some discomfort in her left knee joint which has been replaced.  She has some discomfort in her other joints which include her hands and her lower back.  There is no radiculopathy.  Activities of Daily Living:  Patient reports morning stiffness for  30  minutes.   Patient Reports nocturnal pain.  Difficulty dressing/grooming: Denies Difficulty climbing stairs: Denies Difficulty getting out of chair: Denies Difficulty using hands for taps, buttons, cutlery, and/or writing: Reports  Review of Systems  Constitutional: Negative for fatigue.  HENT: Negative for mouth sores, mouth dryness and nose dryness.   Eyes: Positive for itching and dryness.  Respiratory: Negative for shortness of breath and difficulty breathing.   Cardiovascular: Negative for chest pain and palpitations.  Gastrointestinal: Negative for blood in stool, constipation and diarrhea.  Endocrine: Negative for increased urination.  Genitourinary: Negative for difficulty urinating and painful urination.  Musculoskeletal: Positive for arthralgias, joint pain and morning stiffness. Negative for joint swelling, myalgias, muscle tenderness and myalgias.  Skin: Negative for color change, rash and redness.  Allergic/Immunologic: Negative for susceptible to infections.  Neurological: Positive for numbness. Negative for dizziness, headaches,  memory loss and weakness.  Hematological: Positive for bruising/bleeding tendency.  Psychiatric/Behavioral: Negative for confusion and sleep disturbance.    PMFS History:  Patient Active Problem List   Diagnosis Date Noted  . PFO (patent foramen ovale) 10/09/2019  . Apical variant hypertrophic cardiomyopathy ([place]) 10/09/2019  . Chronic systolic dysfunction of right ventricle 10/09/2019  . Right thyroid enlargement 02/06/2019  . Overweight (BMI 25.0-29.9) 09/27/2018  . Bursitis of right hip 03/08/2018  . Aneurysm ([place]) 05/01/2016  . Osteopenia 08/03/2015  . Venous stasis 07/07/2015  . Emphysema lung ([place]) 01/08/2015  . B12 deficiency 07/08/2014  . Prediabetes 07/08/2014  . Hyperlipidemia 07/08/2014  . COPD (chronic obstructive pulmonary disease) ([place]) 04/29/2014  . Simple partial seizure with special sensory symptoms ([place]) 11/04/2013  . Cerebrovascular disease 08/21/2013  . TIA (transient ischemic attack) 07/25/2013  . Hypothyroidism 02/13/2012    Class: Chronic  . Vitamin D deficiency 06/28/2009  . HYPERTENSION, BENIGN ESSENTIAL 06/03/2008  . TACHYCARDIA, PAROXYSMAL SUPRAVENTRICULAR 06/14/2006  . Degenerative arthritis of knee, bilateral 06/14/2006  . DDD (degenerative disc disease), lumbosacral 06/14/2006    Past Medical History:  Diagnosis Date  . Arthritis    sed rate 10, RF, CCP pending  . Blood in stool   . Cervical cancer ([place])    s/p hysterectomy/oop  . DDD (degenerative disc disease), lumbosacral   . Depression    Due to husbands passing away 09/22/2011  . Diverticulosis of colon   . Environmental allergies   . GERD (gastroesophageal reflux disease)   . H pylori ulcer    not treated due to expense  10/2001  . Hand dermatitis   . HTN (hypertension)   . Hyperlipidemia   . Hypothyroidism    TSH 14.488 (11/2005)  . IBS (irritable bowel syndrome)   . Lactose intolerance   . Migraines    "get them very rarely now" (02/08/2012)  . Multiple pigmented nevi    last  derm evaluation 01/2003  . OA (osteoarthritis)    multiple sites  . Other seborrheic keratosis   . Pain in joint, lower leg   . Pneumonia    "couple times in my lifetime" (02/08/2012)  . PONV (postoperative nausea and vomiting)    "and takes me a long time to come out under it" (02/08/2012)  . Posterior vitreous detachment 1996  . Postmenopausal    s/p hysterectomy for h/o cervical cancer 1982 (both ovaries taken at that time) now on hormonal replacement   . Postmenopausal HRT (hormone replacement therapy)   . PPD positive    history +PPD 1984, no treatment, no abnormal CXR  . Shortness of breath    with ambulation  . Stroke [facility])    TIA- April 2016 last one  . Supraventricular tachycardia, paroxysmal ([place])   . Urinary frequency   . Vaginal pruritus     Family History  Problem Relation Age of Onset  . Breast cancer Mother 38  . Lung cancer Brother 34  . Cancer Brother        lung  . Heart failure Father        congestive  . Heart disease Father   . Epilepsy Son   . Kidney disease Son        tuberous sclerosis, both kidneys removed, has transplant  . Diabetes Maternal Uncle   . Diabetes Paternal Aunt   . Cancer Maternal Grandmother        breast  . Breast cancer Maternal Grandmother   . Heart disease Son        [place] white  . COPD Son   . Rectal cancer Maternal Aunt   . Colon cancer Neg Hx   . Esophageal cancer Neg Hx   . Stomach cancer Neg Hx    Past Surgical History:  Procedure Laterality Date  . ABDOMINAL HYSTERECTOMY  1982  . CARDIOVASCULAR STRESS TEST  12/07/11   Normal nuclear stress test  . CATARACT EXTRACTION W/ INTRAOCULAR LENS IMPLANT  ?1997   right  . COLONOSCOPY    . EYE SURGERY     cataract removal right eye  . TOTAL ABDOMINAL HYSTERECTOMY W/ BILATERAL SALPINGOOPHORECTOMY  1982  . TOTAL KNEE ARTHROPLASTY  02/08/2012   Procedure: TOTAL KNEE ARTHROPLASTY;  Surgeon: Hessie Dibble, MD;  Location: [place];  Service: Orthopedics;  Laterality:  Left;   Social History   Social History Narrative   Lives with son, [name] who has medical problems-they help each other. Her husband died 2022/10/08 after 33 years of marriage.  She has 2 other sons that live within 2 hrs from her. She does not drive-she uses [facility]. She used to work in Scientist, research (medical), retired.     Tobacco: 25 pack yr hx, quit 1985.   Immunization History  Administered Date(s) Administered  . Fluad Quad(high Dose 65+) 02/06/2019  . Influenza Split 01/12/2011, 02/09/2012  . Influenza Whole 02/08/2007, 01/24/2008, 12/16/2008, 01/27/2009, 02/17/2010  . Influenza, High Dose Seasonal PF 01/16/2017, 01/25/2018  . Influenza,inj,Quad PF,6+ Mos 12/17/2012, 12/19/2013, 01/08/2015, 01/06/2016  . PFIZER SARS-COV-2 Vaccination 07/02/2019, 07/23/2019  . Pneumococcal Conjugate-13 07/08/2014  . Pneumococcal Polysaccharide-23 05/23/2004  .  Td 06/16/2002  . Tdap 12/17/2012  . Zoster 12/17/2012     Objective: Vital Signs: BP (!) 160/79 (BP Location: Left Arm, Patient Position: Sitting, Cuff Size: Small)   Pulse 60   Resp 12   Ht 5' (1.524 m)   Wt 156 lb (70.8 kg)   BMI 30.47 kg/m    Physical Exam Vitals and nursing note reviewed.  Constitutional:      Appearance: She is well-developed.  HENT:     Head: Normocephalic and atraumatic.  Eyes:     Conjunctiva/sclera: Conjunctivae normal.  Cardiovascular:     Rate and Rhythm: Normal rate and regular rhythm.     Heart sounds: Normal heart sounds.  Pulmonary:     Effort: Pulmonary effort is normal.     Breath sounds: Normal breath sounds.  Abdominal:     General: Bowel sounds are normal.     Palpations: Abdomen is soft.  Musculoskeletal:     Cervical back: Normal range of motion.  Lymphadenopathy:     Cervical: No cervical adenopathy.  Skin:    General: Skin is warm and dry.     Capillary Refill: Capillary refill takes less than 2 seconds.  Neurological:     Mental Status: She is alert and oriented to person, place, and  time.  Psychiatric:        Behavior: Behavior normal.      Musculoskeletal Exam: She has limited range of motion of cervical spine. She has thoracic kyphosis. She had no discomfort in the thoracic or lumbar region. Shoulder joints, elbow joints, wrist joints with good range of motion. She has severe PIP and DIP thickening with subluxation of PIP and DIP joints. Hip joints, knee joints with good range of motion. Left knee joint is replaced. She had no tenderness over ankles or MTPs.  CDAI Exam: CDAI Score: -- Patient Global: --; Provider Global: -- Swollen: --; Tender: -- Joint Exam 02/03/2020   No joint exam has been documented for this visit   There is currently no information documented on the homunculus. Go to the Rheumatology activity and complete the homunculus joint exam.  Investigation: No additional findings.  Imaging: No results found.  Recent Labs: Lab Results  Component Value Date   WBC 6.1 09/16/2019   HGB 13.3 09/16/2019   PLT 217.0 09/16/2019   NA 133 (L) 09/16/2019   K 4.8 09/16/2019   CL 98 09/16/2019   CO2 29 09/16/2019   GLUCOSE 97 09/16/2019   BUN 11 09/16/2019   CREATININE 0.75 09/16/2019   BILITOT 0.4 09/16/2019   ALKPHOS 77 09/16/2019   AST 16 09/16/2019   ALT 10 09/16/2019   PROT 7.1 09/16/2019   ALBUMIN 4.5 09/16/2019   CALCIUM 9.1 09/16/2019   GFRAA 76 08/03/2016   10/04/2017 The BMD measured at Femur Neck Left is 0.736 g/cm2 with a T-score of -2.4. L spine   By: Lavonia Dana M.D.   On: 10/04/2017 14:04   Speciality Comments: No specialty comments available.  Procedures:  No procedures performed Allergies: Benzonatate, Sulfonamide derivatives, Fosamax [alendronate], Hctz [hydrochlorothiazide], and Lisinopril   Assessment / Plan:     Visit Diagnoses: Osteopenia, unspecified location - DEXA 10/04/17: The BMD measured at Femur Neck Left is 0.736 g/cm2 with a T-score of -2.2. Patient needs a repeat DEXA scan. Have advised her to get the  DEXA scan with her PCP so we can compare on the same the scan. Use of calcium, vitamin D and resistive exercises were discussed.  Vitamin D deficiency-she has been taking vitamin D.  Trochanteric bursitis of right hip-the symptoms have improved since she has been doing stretching exercises.  Status post total left knee replacement-she continues to have some left knee discomfort.  Primary osteoarthritis of both hands - she has severe osteoarthritis in her bilateral hands.  DDD (degenerative disc disease), lumbosacral-she is off and on lower back discomfort.  Other medical problems are listed as follows:  TIA (transient ischemic attack)  B12 deficiency  Cerebrovascular disease  Essential hypertension-her blood pressure is elevated today. Have advised her to monitor blood pressure closely.  History of hyperlipidemia  SVT (supraventricular tachycardia) ([facility])  Chronic obstructive pulmonary disease, unspecified COPD type ([place])  Simple partial seizure with special sensory symptoms ([place])  Educated about COVID-19 virus infection-she is fully vaccinated against COVID-19. Use of booster was discussed once available to her. Use of mask, social distancing and hand hygiene was discussed.  Orders: No orders of the defined types were placed in this encounter.  No orders of the defined types were placed in this encounter.  .  Follow-Up Instructions: Return in about 3 months (around 05/05/2020) for Osteoarthritis.   Bo Merino, MD  Note - This record has been created using Editor, commissioning.  Chart creation errors have been sought, but may not always  have been located. Such creation errors do not reflect on  the standard of medical care.

## 2020-02-03 ENCOUNTER — Other Ambulatory Visit: Payer: Self-pay

## 2020-02-03 ENCOUNTER — Ambulatory Visit (INDEPENDENT_AMBULATORY_CARE_PROVIDER_SITE_OTHER): Payer: Medicare Other | Admitting: Rheumatology

## 2020-02-03 ENCOUNTER — Encounter: Payer: Self-pay | Admitting: Rheumatology

## 2020-02-03 ENCOUNTER — Telehealth: Payer: Self-pay

## 2020-02-03 VITALS — BP 160/79 | HR 60 | Resp 12 | Ht 60.0 in | Wt 156.0 lb

## 2020-02-03 DIAGNOSIS — Z96652 Presence of left artificial knee joint: Secondary | ICD-10-CM

## 2020-02-03 DIAGNOSIS — G459 Transient cerebral ischemic attack, unspecified: Secondary | ICD-10-CM

## 2020-02-03 DIAGNOSIS — M7061 Trochanteric bursitis, right hip: Secondary | ICD-10-CM

## 2020-02-03 DIAGNOSIS — I471 Supraventricular tachycardia, unspecified: Secondary | ICD-10-CM

## 2020-02-03 DIAGNOSIS — I10 Essential (primary) hypertension: Secondary | ICD-10-CM

## 2020-02-03 DIAGNOSIS — I679 Cerebrovascular disease, unspecified: Secondary | ICD-10-CM

## 2020-02-03 DIAGNOSIS — M5137 Other intervertebral disc degeneration, lumbosacral region: Secondary | ICD-10-CM

## 2020-02-03 DIAGNOSIS — E559 Vitamin D deficiency, unspecified: Secondary | ICD-10-CM

## 2020-02-03 DIAGNOSIS — M19042 Primary osteoarthritis, left hand: Secondary | ICD-10-CM

## 2020-02-03 DIAGNOSIS — M8580 Other specified disorders of bone density and structure, unspecified site: Secondary | ICD-10-CM

## 2020-02-03 DIAGNOSIS — G40109 Localization-related (focal) (partial) symptomatic epilepsy and epileptic syndromes with simple partial seizures, not intractable, without status epilepticus: Secondary | ICD-10-CM

## 2020-02-03 DIAGNOSIS — M19041 Primary osteoarthritis, right hand: Secondary | ICD-10-CM

## 2020-02-03 DIAGNOSIS — E538 Deficiency of other specified B group vitamins: Secondary | ICD-10-CM

## 2020-02-03 DIAGNOSIS — Z8639 Personal history of other endocrine, nutritional and metabolic disease: Secondary | ICD-10-CM

## 2020-02-03 DIAGNOSIS — J449 Chronic obstructive pulmonary disease, unspecified: Secondary | ICD-10-CM

## 2020-02-03 DIAGNOSIS — M51379 Other intervertebral disc degeneration, lumbosacral region without mention of lumbar back pain or lower extremity pain: Secondary | ICD-10-CM

## 2020-02-03 DIAGNOSIS — Z7189 Other specified counseling: Secondary | ICD-10-CM

## 2020-02-03 NOTE — Telephone Encounter (Signed)
Needs order for bone density exam.  Patient would like to schedule for sometime in November.  Patient can be reached at [phone]

## 2020-02-03 NOTE — Telephone Encounter (Signed)
Pt's last bone density was 10/04/17. Please advise, thanks.

## 2020-02-04 NOTE — Addendum Note (Signed)
Addended by: Kavin Leech on: 02/04/2020 09:26 AM   Modules accepted: Orders

## 2020-02-04 NOTE — Telephone Encounter (Signed)
Okay to place order for bone density, diagnostic code osteopenia/vitamin D deficiency

## 2020-02-04 NOTE — Telephone Encounter (Signed)
Ordered placed

## 2020-02-10 ENCOUNTER — Ambulatory Visit (INDEPENDENT_AMBULATORY_CARE_PROVIDER_SITE_OTHER): Payer: Medicare Other | Admitting: Internal Medicine

## 2020-02-10 ENCOUNTER — Encounter: Payer: Self-pay | Admitting: Internal Medicine

## 2020-02-10 ENCOUNTER — Other Ambulatory Visit: Payer: Self-pay

## 2020-02-10 VITALS — BP 124/62 | HR 61 | Ht 60.0 in | Wt 154.6 lb

## 2020-02-10 DIAGNOSIS — E038 Other specified hypothyroidism: Secondary | ICD-10-CM | POA: Diagnosis not present

## 2020-02-10 DIAGNOSIS — E063 Autoimmune thyroiditis: Secondary | ICD-10-CM

## 2020-02-10 DIAGNOSIS — E049 Nontoxic goiter, unspecified: Secondary | ICD-10-CM

## 2020-02-10 DIAGNOSIS — Z23 Encounter for immunization: Secondary | ICD-10-CM

## 2020-02-10 LAB — TSH: TSH: 2.65 u[IU]/mL (ref 0.35–4.50)

## 2020-02-10 LAB — T4, FREE: Free T4: 0.99 ng/dL (ref 0.60–1.60)

## 2020-02-10 NOTE — Patient Instructions (Signed)
Please continue Levothyroxine 75 mcg.  Take the thyroid hormone every day, with water, at least 30 minutes before breakfast, separated by at least 4 hours from: - acid reflux medications - calcium - iron - multivitamins  Please stop at the lab.  Please return in 1 year.

## 2020-02-10 NOTE — Progress Notes (Signed)
[name], female   DOB: [birth year], 86 y.o.   MRN: [id number]  This visit occurred during the SARS-CoV-2 public health emergency.  Safety protocols were in place, including screening questions prior to the visit, additional usage of staff PPE, and extensive cleaning of exam room while observing appropriate contact time as indicated for disinfecting solutions.   HPI  Amy Rodriguez is a 86 y.o.-year-old female, returning for f/u for hypothyroidism 2/2 to Hashimoto's thyroiditis and R goiter. Last visit 1 year and 3 months ago.  Pt. has been dx with hypothyroidism in 2011, and has Hashimoto's thyroiditis in 2016.  Pt is on levothyroxine 75 mcg daily, taken: - in am - fasting - at least 30 min from b'fast - + calcium (Tums) later in the day-no occasional - no iron - no multivitamins - + PPIs (Prilosec) later in the day-as needed - not on Biotin  Reviewed her TFTs: Lab Results  Component Value Date   TSH 2.97 09/16/2019   TSH 1.93 09/27/2018   TSH 3.65 09/11/2017   TSH 2.96 11/03/2016   TSH 2.13 11/04/2015   TSH 2.57 08/03/2015   TSH 2.47 10/20/2014   TSH 1.63 07/08/2014   TSH 1.15 12/19/2013   TSH 0.78 09/19/2013   FREET4 0.86 11/03/2016   FREET4 0.95 11/04/2015   FREET4 1.03 07/08/2014   FREET4 1.39 03/18/2012    Her TPO antibodies were elevated, indicating Hashimoto's thyroiditis: Component     Latest Ref Rng & Units 07/08/2014 10/20/2014  Thyroperoxidase Ab SerPl-aCnc     <9 IU/mL >900 (H) >900 (H)   Enlarged right thyroid lobe   -Initially felt on palpation  Thyroid ultrasound (07/09/2014): Enlarged right lobe, with hypoechoic aspect and moth-eaten appearance suggestive of Hashimoto's thyroiditis.  Normal left lobe.  No nodules  Pt denies: - feeling nodules in neck - hoarseness - dysphagia - choking - SOB with lying down  She also has HL, HTN, GERD, vit D def. - on vit D, bronchiectasis, TIA - small aneurysm - being followed by Dr. Leonie Rodriguez.  She  had L TKR 2013.  Since last visit, she was dx'ed with B12 deficiency >> now on B12 inj's every 2 weeks. She feels better after starting them.  ROS: Constitutional: + weight gain/no weight loss, + improved fatigue, no subjective hyperthermia, no subjective hypothermia Eyes: no blurry vision, no xerophthalmia ENT: no sore throat, + see HPI Cardiovascular: no CP/no SOB/no palpitations/+ leg swelling Respiratory: no cough/no SOB/no wheezing Gastrointestinal: no N/no V/no D/no C/no acid reflux Musculoskeletal: no muscle aches/no joint aches Skin: no rashes, no hair loss Neurological: no tremors/no numbness/no tingling/no dizziness  I reviewed pt's medications, allergies, PMH, social hx, family hx, and changes were documented in the history of present illness. Otherwise, unchanged from my initial visit note. Stopped Atenolol.  Her amlodipine dose was increased in 09/2018.  Past Medical History:  Diagnosis Date  . Arthritis    sed rate 10, RF, CCP pending  . Blood in stool   . Cervical cancer ([place])    s/p hysterectomy/oop  . DDD (degenerative disc disease), lumbosacral   . Depression    Due to husbands passing away 09/22/2011  . Diverticulosis of colon   . Environmental allergies   . GERD (gastroesophageal reflux disease)   . H pylori ulcer    not treated due to expense  10/2001  . Hand dermatitis   . HTN (hypertension)   . Hyperlipidemia   . Hypothyroidism    TSH 14.488 (  11/2005)  . IBS (irritable bowel syndrome)   . Lactose intolerance   . Migraines    "get them very rarely now" (02/08/2012)  . Multiple pigmented nevi    last derm evaluation 01/2003  . OA (osteoarthritis)    multiple sites  . Other seborrheic keratosis   . Pain in joint, lower leg   . Pneumonia    "couple times in my lifetime" (02/08/2012)  . PONV (postoperative nausea and vomiting)    "and takes me a long time to come out under it" (02/08/2012)  . Posterior vitreous detachment 1996  . Postmenopausal     s/p hysterectomy for h/o cervical cancer 1982 (both ovaries taken at that time) now on hormonal replacement   . Postmenopausal HRT (hormone replacement therapy)   . PPD positive    history +PPD 1984, no treatment, no abnormal CXR  . Shortness of breath    with ambulation  . Stroke [facility])    TIA- April 2016 last one  . Supraventricular tachycardia, paroxysmal ([place])   . Urinary frequency   . Vaginal pruritus    Past Surgical History:  Procedure Laterality Date  . ABDOMINAL HYSTERECTOMY  1982  . CARDIOVASCULAR STRESS TEST  12/07/11   Normal nuclear stress test  . CATARACT EXTRACTION W/ INTRAOCULAR LENS IMPLANT  ?1997   right  . COLONOSCOPY    . EYE SURGERY     cataract removal right eye  . TOTAL ABDOMINAL HYSTERECTOMY W/ BILATERAL SALPINGOOPHORECTOMY  1982  . TOTAL KNEE ARTHROPLASTY  02/08/2012   Procedure: TOTAL KNEE ARTHROPLASTY;  Surgeon: Hessie Dibble, MD;  Location: [place];  Service: Orthopedics;  Laterality: Left;   Social History   Socioeconomic History  . Marital status: Widowed    Spouse name: Not on file  . Number of children: 3  . Years of education: 12TH  . Highest education level: Not on file  Occupational History  . Occupation: retired    Comment: Academic librarian: RETIRED  Tobacco Use  . Smoking status: Former Smoker    Packs/day: 1.00    Years: 25.00    Pack years: 25.00    Types: Cigarettes    Quit date: 04/17/1980    Years since quitting: 39.8  . Smokeless tobacco: Never Used  . Tobacco comment: passive smoker, husband smoked  Vaping Use  . Vaping Use: Never used  Substance and Sexual Activity  . Alcohol use: No  . Drug use: No  . Sexual activity: Never  Other Topics Concern  . Not on file  Social History Narrative   Lives with son, Amy Rodriguez who has medical problems-they help each other. Her husband died 10-15-2022 after 12 years of marriage.  She has 2 other sons that live within 2 hrs from her. She does not drive-she uses [facility]. She used  to work in Scient[employer], research (medical), retired.     Tobacco: 25 pack yr hx, quit 1985.   Social Determinants of Health   Financial Resource Strain:   . Difficulty of Paying Living Expenses: Not on file  Food Insecurity:   . Worried About Charity fundraiser in the Last Year: Not on file  . Ran Out of Food in the Last Year: Not on file  Transportation Needs:   . Lack of Transportation (Medical): Not on file  . Lack of Transportation (Non-Medical): Not on file  Physical Activity:   . Days of Exercise per Week: Not on file  . Minutes of Exercise per Session:  Not on file  Stress:   . Feeling of Stress : Not on file  Social Connections:   . Frequency of Communication with Friends and Family: Not on file  . Frequency of Social Gatherings with Friends and Family: Not on file  . Attends Religious Services: Not on file  . Active Member of Clubs or Organizations: Not on file  . Attends Archivist Meetings: Not on file  . Marital Status: Not on file  Intimate Partner Violence:   . Fear of Current or Ex-Partner: Not on file  . Emotionally Abused: Not on file  . Physically Abused: Not on file  . Sexually Abused: Not on file   Current Outpatient Medications on File Prior to Visit  Medication Sig Dispense Refill  . albuterol (PROAIR HFA) 108 (90 Base) MCG/ACT inhaler Inhale 2 puffs into the lungs every 4 (four) hours as needed for wheezing or shortness of breath. 1 Inhaler 3  . amLODipine (NORVASC) 5 MG tablet Take 1 tablet (5 mg total) by mouth daily. 90 tablet 1  . aspirin EC 81 MG tablet Take 81 mg by mouth daily. Swallow whole.    . Cholecalciferol (VITAMIN D3) 20 MCG (800 UNIT) TABS Take 1 tablet by mouth daily. 90 tablet 1  . Cyanocobalamin 1000 MCG/ML KIT Inject 1 mL as directed every 14 (fourteen) days. 2 kit 11  . diclofenac sodium (VOLTAREN) 1 % GEL Apply 2 g topically as needed. 100 g 11  . fexofenadine (ALLEGRA ALLERGY) 180 MG tablet Take 1 tablet (180 mg total) by mouth daily. 90 tablet 3    . levothyroxine (SYNTHROID) 75 MCG tablet Take 1 tablet (75 mcg total) by mouth daily before breakfast. 90 tablet 3  . losartan (COZAAR) 25 MG tablet Take 0.5 tablets (12.5 mg total) by mouth daily. 45 tablet 1  . omeprazole (PRILOSEC) 10 MG capsule Take 1 capsule (10 mg total) by mouth as needed. (Patient taking differently: Take 10 mg by mouth as needed. PRN) 90 capsule 3  . simvastatin (ZOCOR) 10 MG tablet TAKE 1 TABLET BY MOUTH EVERY DAY IN THE EVENING 90 tablet 4   No current facility-administered medications on file prior to visit.   Allergies  Allergen Reactions  . Benzonatate Nausea And Vomiting    "tessalon pearl"  . Sulfonamide Derivatives Itching and Rash  . Fosamax [Alendronate]   . Hctz [Hydrochlorothiazide] Other (See Comments)    Severe hyponatremia   . Lisinopril Cough   Family History  Problem Relation Age of Onset  . Breast cancer Mother 51  . Lung cancer Brother 13  . Cancer Brother        lung  . Heart failure Father        congestive  . Heart disease Father   . Epilepsy Son   . Kidney disease Son        tuberous sclerosis, both kidneys removed, has transplant  . Diabetes Maternal Uncle   . Diabetes Paternal Aunt   . Cancer Maternal Grandmother        breast  . Breast cancer Maternal Grandmother   . Heart disease Son        [place]  . COPD Son   . Rectal cancer Maternal Aunt   . Colon cancer Neg Hx   . Esophageal cancer Neg Hx   . Stomach cancer Neg Hx    PE: BP 124/62   Pulse 61   Ht 5' (1.524 m)   Wt 154 lb 9.6 oz (  70.1 kg)   SpO2 94%   BMI 30.19 kg/m  Body mass index is 30.19 kg/m. Wt Readings from Last 3 Encounters:  02/10/20 154 lb 9.6 oz (70.1 kg)  02/03/20 156 lb (70.8 kg)  12/18/19 156 lb (70.8 kg)   Constitutional: overweight, in NAD Eyes: PERRLA, EOMI, no exophthalmos ENT: moist mucous membranes, + slight R thyromegaly, no cervical lymphadenopathy Cardiovascular: RRR, No MRG, no LE edema at today's visit Respiratory:  CTA B Gastrointestinal: abdomen soft, NT, ND, BS+ Musculoskeletal: no deformities, strength intact in all 4 Skin: moist, warm, no rashes Neurological: no tremor with outstretched hands, DTR normal in all 4  ASSESSMENT: 1. Hypothyroidism - 2/2 Hashimoto's thyroiditis  2.  Right goiter - Thyroid U/S (07/09/2014):   Right thyroid lobe: 7.6 x 3.3 x 3.6 cm. The right thyroid lobe is diffusely enlarged and hypoechoic. Right thyroid tissue is mildly lobular but there is not a focal nodule. (honeycomb appearance)   Left thyroid lobe: 5.1 x 2.2 x 2.2 cm. Left thyroid tissue is heterogeneous but more hyperechoic than the right thyroid lobe and isthmus. There is not a focal nodule. Left thyroid tissue is very vascular.   Isthmus Thickness: 0.8 cm. Isthmus is prominent for size and has a similar hypoechoic appearance as the right thyroid lobe.   Lymphadenopathy None visualized.    PLAN:  1. Patient with several years of Hashimoto's hypothyroidism, with good control - latest thyroid labs reviewed with pt >> normal: Lab Results  Component Value Date   TSH 2.97 09/16/2019   - she continues on LT4 75 mcg daily - pt feels good on this dose, without complaints - we discussed about taking the thyroid hormone every day, with water, >30 minutes before breakfast, separated by >4 hours from acid reflux medications, calcium, iron, multivitamins. Pt. is taking it correctly. - will check thyroid tests today: TSH and fT4 - If labs are abnormal, she will need to return for repeat TFTs in 1.5 months  2. Right thyroid lobe enlargement -Reviewed the images of her thyroid ultrasound from 2016: Large right thyroid lobe, however, without nodules and with heterogeneous aspect, consistent with Hashimoto's thyroiditis.  It also contains septations and it is hypoechoic.   -she denies any neck compression sxs -we discussed that there is no need to repeat a thyroid ultrasound on that she starts having  neck compression symptoms  + Flu shot today.  Component     Latest Ref Rng & Units 02/10/2020  TSH     0.35 - 4.50 uIU/mL 2.65  T4,Free(Direct)     0.60 - 1.60 ng/dL 0.99   Thyroid tests are normal.  Philemon Kingdom, MD PhD [facility] Endocrin[facility]

## 2020-03-17 ENCOUNTER — Telehealth: Payer: Self-pay | Admitting: Family Medicine

## 2020-03-17 NOTE — Telephone Encounter (Signed)
Left message for patient to schedule Annual Wellness Visit.  Please schedule with Nurse Health Advisor Martha Stanley, RN at [place]  °

## 2020-03-18 ENCOUNTER — Telehealth: Payer: Self-pay

## 2020-03-18 DIAGNOSIS — M8580 Other specified disorders of bone density and structure, unspecified site: Secondary | ICD-10-CM

## 2020-03-18 DIAGNOSIS — I10 Essential (primary) hypertension: Secondary | ICD-10-CM

## 2020-03-18 DIAGNOSIS — E559 Vitamin D deficiency, unspecified: Secondary | ICD-10-CM

## 2020-03-18 MED ORDER — AMLODIPINE BESYLATE 5 MG PO TABS: 5.0000 mg | ORAL_TABLET | Freq: Every day | ORAL | 1 refills | Status: DC

## 2020-03-18 NOTE — Telephone Encounter (Signed)
Patient calling for 2 things.  1) patient referred by Dr. Raoul Pitch to get bone density test. She is getting calls from [facility] to schedule appt. She was told she needed to go to same place as last time, so they can compare the results from in June 2019. Please change location to [place].  2) patient needs refill on  amLODipine (NORVASC) 5 MG tablet [[id number]]    [facility] [place]    Thank you

## 2020-03-18 NOTE — Telephone Encounter (Signed)
Referral re-entered. Pt sched for appt

## 2020-03-23 ENCOUNTER — Other Ambulatory Visit: Payer: Self-pay

## 2020-03-23 ENCOUNTER — Encounter: Payer: Self-pay | Admitting: Family Medicine

## 2020-03-23 ENCOUNTER — Ambulatory Visit (INDEPENDENT_AMBULATORY_CARE_PROVIDER_SITE_OTHER): Payer: Medicare Other | Admitting: Family Medicine

## 2020-03-23 VITALS — BP 131/68 | HR 63 | Temp 97.6°F | Ht 61.0 in | Wt 155.0 lb

## 2020-03-23 DIAGNOSIS — M8580 Other specified disorders of bone density and structure, unspecified site: Secondary | ICD-10-CM | POA: Diagnosis not present

## 2020-03-23 DIAGNOSIS — I10 Essential (primary) hypertension: Secondary | ICD-10-CM

## 2020-03-23 DIAGNOSIS — Z1231 Encounter for screening mammogram for malignant neoplasm of breast: Secondary | ICD-10-CM

## 2020-03-23 MED ORDER — CYANOCOBALAMIN 1000 MCG/ML IJ KIT: 1.0000 mL | PACK | INTRAMUSCULAR | 11 refills | Status: DC

## 2020-03-23 MED ORDER — FEXOFENADINE HCL 180 MG PO TABS
180.0000 mg | ORAL_TABLET | Freq: Every day | ORAL | 3 refills | Status: DC
Start: 2020-03-23 — End: 2020-09-07

## 2020-03-23 MED ORDER — LOSARTAN POTASSIUM 25 MG PO TABS
12.5000 mg | ORAL_TABLET | Freq: Every day | ORAL | 1 refills | Status: DC
Start: 2020-03-23 — End: 2020-09-07

## 2020-03-23 MED ORDER — LEVOTHYROXINE SODIUM 75 MCG PO TABS: 75.0000 ug | ORAL_TABLET | Freq: Every day | ORAL | 3 refills | Status: DC

## 2020-03-23 MED ORDER — AMLODIPINE BESYLATE 5 MG PO TABS: 5.0000 mg | ORAL_TABLET | Freq: Every day | ORAL | 1 refills | Status: DC

## 2020-03-23 NOTE — Progress Notes (Signed)
Amy Rodriguez , [birth year], 86 y.o., female MRN: [id number] Patient Care Team    Relationship Specialty Notifications Start End  Ma Hillock, DO PCP - General Family Medicine  01/08/15   Shon Hough, MD Consulting Physician Ophthalmology  08/03/15   Collene Gobble, MD Consulting Physician Pulmonary Disease  08/03/15   Lelon Perla, MD Consulting Physician Cardiology  08/03/15   Garvin Fila, MD Consulting Physician Neurology  08/03/15   Inda Castle, MD (Inactive) Consulting Physician Gastroenterology  08/04/15   Philemon Kingdom, MD Consulting Physician Internal Medicine  08/07/16   Melrose Nakayama, MD Consulting Physician Orthopedic Surgery  08/07/16     Chief Complaint  Patient presents with  . Follow-up    [facility]    Subjective: Amy Rodriguez is a 86 y.o. female present for [facility] follow up  Hypertension/hyperlipidemia/overweight: Patient reports compliance with amlodipine 5 mg daily and losartan 12.5 mg.  Patient reports compliance with daily baby aspirin and Zocor 10 mg daily. Patient does have a small MCA aneurysm, SVT, history of stroke/tia and is followed by cardiology.  She had been on atenolol low-dose and this was discontinued recently by cardiology.  She also been tried on lisinopril but noticed a tickle cough and changed to losartan. Patient denies chest pain, shortness of breath, dizziness or lower extremity edema.  -Labs up-to-date 09/2019  Hypothyroid: Patient reports compliance  with levothyroxine 75 g daily.  Labs UTD 09/2019  GERD: Patient reports she takes Prilosec 10 mg as needed  Medication works well.   Depression screen [facility] 2/9 03/23/2020 09/27/2018 08/14/2017 08/07/2016 08/03/2015  Decreased Interest 0 0 0 0 0  Down, Depressed, Hopeless - 0 0 0 0  PHQ - 2 Score 0 0 0 0 0  Some recent data might be hidden    Allergies  Allergen Reactions  . Benzonatate Nausea And Vomiting    "tessalon pearl"  . Sulfonamide Derivatives Itching and Rash  .  Fosamax [Alendronate]   . Hctz [Hydrochlorothiazide] Other (See Comments)    Severe hyponatremia   . Lisinopril Cough   Social History   Tobacco Use  . Smoking status: Former Smoker    Packs/day: 1.00    Years: 25.00    Pack years: 25.00    Types: Cigarettes    Quit date: 04/17/1980    Years since quitting: 39.9  . Smokeless tobacco: Never Used  . Tobacco comment: passive smoker, husband smoked  Substance Use Topics  . Alcohol use: No   Past Medical History:  Diagnosis Date  . Arthritis    sed rate 10, RF, CCP pending  . Blood in stool   . Cervical cancer ([place])    s/p hysterectomy/oop  . DDD (degenerative disc disease), lumbosacral   . Depression    Due to husbands passing away 09/22/2011  . Diverticulosis of colon   . Environmental allergies   . GERD (gastroesophageal reflux disease)   . H pylori ulcer    not treated due to expense  10/2001  . Hand dermatitis   . HTN (hypertension)   . Hyperlipidemia   . Hypothyroidism    TSH 14.488 (11/2005)  . IBS (irritable bowel syndrome)   . Lactose intolerance   . Migraines    "get them very rarely now" (02/08/2012)  . Multiple pigmented nevi    last derm evaluation 01/2003  . OA (osteoarthritis)    multiple sites  . Other seborrheic keratosis   . Pain in joint, lower leg   .  Pneumonia    "couple times in my lifetime" (02/08/2012)  . PONV (postoperative nausea and vomiting)    "and takes me a long time to come out under it" (02/08/2012)  . Posterior vitreous detachment 1996  . Postmenopausal    s/p hysterectomy for h/o cervical cancer 1982 (both ovaries taken at that time) now on hormonal replacement   . Postmenopausal HRT (hormone replacement therapy)   . PPD positive    history +PPD 1984, no treatment, no abnormal CXR  . Shortness of breath    with ambulation  . Stroke [facility])    TIA- April 2016 last one  . Supraventricular tachycardia, paroxysmal ([place])   . Urinary frequency   . Vaginal pruritus    Past Surgical  History:  Procedure Laterality Date  . ABDOMINAL HYSTERECTOMY  1982  . CARDIOVASCULAR STRESS TEST  12/07/11   Normal nuclear stress test  . CATARACT EXTRACTION W/ INTRAOCULAR LENS IMPLANT  ?1997   right  . COLONOSCOPY    . EYE SURGERY     cataract removal right eye  . TOTAL ABDOMINAL HYSTERECTOMY W/ BILATERAL SALPINGOOPHORECTOMY  1982  . TOTAL KNEE ARTHROPLASTY  02/08/2012   Procedure: TOTAL KNEE ARTHROPLASTY;  Surgeon: Hessie Dibble, MD;  Location: [place];  Service: Orthopedics;  Laterality: Left;   Family History  Problem Relation Age of Onset  . Breast cancer Mother 2  . Lung cancer Brother 3  . Cancer Brother        lung  . Heart failure Father        congestive  . Heart disease Father   . Epilepsy Son   . Kidney disease Son        tuberous sclerosis, both kidneys removed, has transplant  . Diabetes Maternal Uncle   . Diabetes Paternal Aunt   . Cancer Maternal Grandmother        breast  . Breast cancer Maternal Grandmother   . Heart disease Son        [place] white  . COPD Son   . Rectal cancer Maternal Aunt   . Colon cancer Neg Hx   . Esophageal cancer Neg Hx   . Stomach cancer Neg Hx    Allergies as of 03/23/2020      Reactions   Benzonatate Nausea And Vomiting   "tessalon pearl"   Sulfonamide Derivatives Itching, Rash   Fosamax [alendronate]    Hctz [hydrochlorothiazide] Other (See Comments)   Severe hyponatremia    Lisinopril Cough      Medication List       Accurate as of March 23, 2020  2:25 PM. If you have any questions, ask your nurse or doctor.        albuterol 108 (90 Base) MCG/ACT inhaler Commonly known as: ProAir HFA Inhale 2 puffs into the lungs every 4 (four) hours as needed for wheezing or shortness of breath.   amLODipine 5 MG tablet Commonly known as: NORVASC Take 1 tablet (5 mg total) by mouth daily.   aspirin EC 81 MG tablet Take 81 mg by mouth daily. Swallow whole.   Cyanocobalamin 1000 MCG/ML Kit Inject 1 mL as  directed every 14 (fourteen) days.   diclofenac sodium 1 % Gel Commonly known as: Voltaren Apply 2 g topically as needed.   fexofenadine 180 MG tablet Commonly known as: Allegra Allergy Take 1 tablet (180 mg total) by mouth daily.   levothyroxine 75 MCG tablet Commonly known as: SYNTHROID Take 1 tablet (75 mcg total) by mouth  daily before breakfast.   losartan 25 MG tablet Commonly known as: COZAAR Take 0.5 tablets (12.5 mg total) by mouth daily.   omeprazole 10 MG capsule Commonly known as: PRILOSEC Take 1 capsule (10 mg total) by mouth as needed. What changed: additional instructions   simvastatin 10 MG tablet Commonly known as: ZOCOR TAKE 1 TABLET BY MOUTH EVERY DAY IN THE EVENING   Vitamin D3 20 MCG (800 UNIT) Tabs Take 1 tablet by mouth daily.       No results found for this or any previous visit (from the past 24 hour(s)). No results found.   ROS: Negative, with the exception of above mentioned in HPI   Objective:  BP 131/68   Pulse 63   Temp 97.6 F (36.4 C) (Oral)   Ht '5\' 1"'  (1.549 m)   Wt 155 lb (70.3 kg)   SpO2 95%   BMI 29.29 kg/m  Body mass index is 29.29 kg/m. Gen: Afebrile. No acute distress. Nontoxic, very pleasant female.  HENT: AT. [place].  Eyes:Pupils Equal Round Reactive to light, Extraocular movements intact,  Conjunctiva without redness, discharge or icterus. Neck/lymp/endocrine: Supple,no lymphadenopathy, no thyromegaly CV: RRR 21/6 SM, no edema, +2/4 P posterior tibialis pulses Chest: CTAB, no wheeze or crackles Abd: Soft. NTND. BS present. no Masses palpated.  Neuro:  Normal gait. PERLA. EOMi. Alert. Oriented x3 Psych: Normal affect, dress and demeanor. Normal speech. Normal thought content and judgment.   Assessment/Plan: ANNALEIGH STEINMEYER is a 86 y.o. female present for OV for follow-up on chronic medical conditions. SVT/TIA history/small MCA aneurysm/hypertension/hyperlipidemia/CVD/overweight - stable  Continue  amlodipine 5 mg  daily. -continue losartan 12.5 mg daily. -Was on low-dose beta-blocker that has been discontinued secondary to decreased energy and low heart rate. -Echocardiogram: 09/2019 - continue  81 mg ASA.  - continue  simvastatin 10 mg QD  hypothyroidism tsh normal 09/2019 -Continue levothyroxine 75 mcg daily if thyroid in normal range.  GERD:  Stable.  Continue  Prilosec 10 mg daily PRN  vitamin D deficiency/B12 deficiency: Currently taking vitamin D and B12 - discussed b12 sublingal   Osteopenia/breast cancer screen DEXA 3D Mamm ordered   Reviewed expectations re: course of current medical issues.  Discussed self-management of symptoms.  Outlined signs and symptoms indicating need for more acute intervention.  Patient verbalized understanding and all questions were answered.  Patient received an After-Visit Summary.    Orders Placed This Encounter  Procedures  . MM 3D SCREEN BREAST BILATERAL  . DG Bone Density   Meds ordered this encounter  Medications  . losartan (COZAAR) 25 MG tablet    Sig: Take 0.5 tablets (12.5 mg total) by mouth daily.    Dispense:  45 tablet    Refill:  1    If you could, would you please cut these in half for her. She has trouble doing it herself.  Marland Kitchen amLODipine (NORVASC) 5 MG tablet    Sig: Take 1 tablet (5 mg total) by mouth daily.    Dispense:  90 tablet    Refill:  1    DC prior dose scripts please  . Cyanocobalamin 1000 MCG/ML KIT    Sig: Inject 1 mL as directed every 14 (fourteen) days.    Dispense:  2 kit    Refill:  11    Please provide all needed supplies/needle etc.  Thank you  . levothyroxine (SYNTHROID) 75 MCG tablet    Sig: Take 1 tablet (75 mcg total) by mouth daily before breakfast.  Dispense:  90 tablet    Refill:  3  . fexofenadine (ALLEGRA ALLERGY) 180 MG tablet    Sig: Take 1 tablet (180 mg total) by mouth daily.    Dispense:  90 tablet    Refill:  3    electronically signed by:  Howard Pouch, DO  [place]

## 2020-03-23 NOTE — Patient Instructions (Signed)
"  sublingal" b12 of 7000 mcg average a week.  Next appt in 5.5 months

## 2020-09-07 ENCOUNTER — Ambulatory Visit (INDEPENDENT_AMBULATORY_CARE_PROVIDER_SITE_OTHER): Payer: Medicare Other | Admitting: Family Medicine

## 2020-09-07 ENCOUNTER — Other Ambulatory Visit: Payer: Self-pay

## 2020-09-07 ENCOUNTER — Encounter: Payer: Self-pay | Admitting: Family Medicine

## 2020-09-07 VITALS — BP 127/65 | HR 62 | Temp 97.6°F | Ht 61.0 in | Wt 151.0 lb

## 2020-09-07 DIAGNOSIS — I10 Essential (primary) hypertension: Secondary | ICD-10-CM

## 2020-09-07 DIAGNOSIS — G459 Transient cerebral ischemic attack, unspecified: Secondary | ICD-10-CM | POA: Diagnosis not present

## 2020-09-07 DIAGNOSIS — I471 Supraventricular tachycardia: Secondary | ICD-10-CM

## 2020-09-07 DIAGNOSIS — E663 Overweight: Secondary | ICD-10-CM

## 2020-09-07 DIAGNOSIS — E559 Vitamin D deficiency, unspecified: Secondary | ICD-10-CM | POA: Diagnosis not present

## 2020-09-07 DIAGNOSIS — R7303 Prediabetes: Secondary | ICD-10-CM | POA: Diagnosis not present

## 2020-09-07 DIAGNOSIS — E538 Deficiency of other specified B group vitamins: Secondary | ICD-10-CM | POA: Diagnosis not present

## 2020-09-07 DIAGNOSIS — E063 Autoimmune thyroiditis: Secondary | ICD-10-CM

## 2020-09-07 DIAGNOSIS — M8580 Other specified disorders of bone density and structure, unspecified site: Secondary | ICD-10-CM | POA: Diagnosis not present

## 2020-09-07 DIAGNOSIS — E7800 Pure hypercholesterolemia, unspecified: Secondary | ICD-10-CM | POA: Diagnosis not present

## 2020-09-07 DIAGNOSIS — I679 Cerebrovascular disease, unspecified: Secondary | ICD-10-CM

## 2020-09-07 DIAGNOSIS — E038 Other specified hypothyroidism: Secondary | ICD-10-CM

## 2020-09-07 DIAGNOSIS — R591 Generalized enlarged lymph nodes: Secondary | ICD-10-CM

## 2020-09-07 DIAGNOSIS — I422 Other hypertrophic cardiomyopathy: Secondary | ICD-10-CM

## 2020-09-07 DIAGNOSIS — I729 Aneurysm of unspecified site: Secondary | ICD-10-CM | POA: Diagnosis not present

## 2020-09-07 DIAGNOSIS — E785 Hyperlipidemia, unspecified: Secondary | ICD-10-CM

## 2020-09-07 MED ORDER — FEXOFENADINE HCL 180 MG PO TABS: 180.0000 mg | ORAL_TABLET | Freq: Every day | ORAL | 3 refills | Status: DC

## 2020-09-07 MED ORDER — SIMVASTATIN 10 MG PO TABS
10.0000 mg | ORAL_TABLET | Freq: Every evening | ORAL | 4 refills | Status: DC
Start: 2020-09-07 — End: 2021-07-05

## 2020-09-07 MED ORDER — AMLODIPINE BESYLATE 5 MG PO TABS: 5.0000 mg | ORAL_TABLET | Freq: Every day | ORAL | 1 refills | Status: DC

## 2020-09-07 MED ORDER — OMEPRAZOLE 10 MG PO CPDR: 10.0000 mg | DELAYED_RELEASE_CAPSULE | ORAL | 3 refills | Status: DC | PRN

## 2020-09-07 MED ORDER — LOSARTAN POTASSIUM 25 MG PO TABS: 12.5000 mg | ORAL_TABLET | Freq: Every day | ORAL | 1 refills | Status: DC

## 2020-09-07 NOTE — Patient Instructions (Addendum)
  Great to see you today.  I have refilled the medication(s) we provide.  Your blood pressure looks great.  They will call you to get you scheduled for the Ultrasound neck.   If labs were collected, we will inform you of lab results once received either by echart message or telephone call.   - echart message- for normal results that have been seen by the patient already.   - telephone call: abnormal results or if patient has not viewed results in their echart.  Next appt 5.5 months.

## 2020-09-07 NOTE — Progress Notes (Signed)
Amy Rodriguez , August 02, 1933, 87 y.o., female MRN: [id number] Patient Care Team    Relationship Specialty Notifications Start End  Ma Hillock, DO PCP - General Family Medicine  01/08/15   Shon Hough, MD Consulting Physician Ophthalmology  08/03/15   Collene Gobble, MD Consulting Physician Pulmonary Disease  08/03/15   Lelon Perla, MD Consulting Physician Cardiology  08/03/15   Garvin Fila, MD Consulting Physician Neurology  08/03/15   Inda Castle, MD (Inactive) Consulting Physician Gastroenterology  08/04/15   Philemon Kingdom, MD Consulting Physician Internal Medicine  08/07/16   Melrose Nakayama, MD Consulting Physician Orthopedic Surgery  08/07/16     Chief Complaint  Patient presents with  . Follow-up    CMC; pt is not fasting  . Hyperlipidemia    Subjective: Amy Rodriguez is a 87 y.o. female present for [facility] follow up  Hypertension/hyperlipidemia/overweight/tachycardia/CAD: Patient reports compliance with amlodipine 5 mg daily and losartan 12.5 mg.  Patient reports compliance with daily baby aspirin and Zocor 10 mg daily. Patient does have a small MCA aneurysm, SVT, history of stroke/tia and is followed by cardiology.  She had been on atenolol low-dose and this was discontinued recently by cardiology.  She also been tried on lisinopril but noticed a tickle cough and changed to losartan.Patient denies chest pain, shortness of breath, dizziness or lower extremity edema.   osteopenia, unspecified location Has DEXA scan scheduled  B12 deficiency/ Vitamin D deficiency Supplementing as recommended.  She is feeling some improvement with the B12  Aneurysm (HCC)/Apical variant hypertrophic cardiomyopathy ([place]) Follows with cardiology  Hypothyroid: Patient reports compliance with levothyroxine 75 g daily.   GERD: Patient reports she takes Prilosec 10 mg as needed  Medication works well.   Depression screen [facility] 2/9 03/23/2020 09/27/2018 08/14/2017 08/07/2016  08/03/2015  Decreased Interest 0 0 0 0 0  Down, Depressed, Hopeless - 0 0 0 0  PHQ - 2 Score 0 0 0 0 0  Some recent data might be hidden    Allergies  Allergen Reactions  . Benzonatate Nausea And Vomiting    "tessalon pearl"  . Sulfonamide Derivatives Itching and Rash  . Fosamax [Alendronate]   . Hctz [Hydrochlorothiazide] Other (See Comments)    Severe hyponatremia   . Lisinopril Cough   Social History   Tobacco Use  . Smoking status: Former Smoker    Packs/day: 1.00    Years: 25.00    Pack years: 25.00    Types: Cigarettes    Quit date: 04/17/1980    Years since quitting: 40.4  . Smokeless tobacco: Never Used  . Tobacco comment: passive smoker, husband smoked  Substance Use Topics  . Alcohol use: No   Past Medical History:  Diagnosis Date  . Arthritis    sed rate 10, RF, CCP pending  . Blood in stool   . Cervical cancer ([place])    s/p hysterectomy/oop  . DDD (degenerative disc disease), lumbosacral   . Depression    Due to husbands passing away 09/22/2011  . Diverticulosis of colon   . Environmental allergies   . GERD (gastroesophageal reflux disease)   . H pylori ulcer    not treated due to expense  10/2001  . Hand dermatitis   . HTN (hypertension)   . Hyperlipidemia   . Hypothyroidism    TSH 14.488 (11/2005)  . IBS (irritable bowel syndrome)   . Lactose intolerance   . Migraines    "get them very rarely now" (  02/08/2012)  . Multiple pigmented nevi    last derm evaluation 01/2003  . OA (osteoarthritis)    multiple sites  . Other seborrheic keratosis   . Pain in joint, lower leg   . Pneumonia    "couple times in my lifetime" (02/08/2012)  . PONV (postoperative nausea and vomiting)    "and takes me a long time to come out under it" (02/08/2012)  . Posterior vitreous detachment 1996  . Postmenopausal    s/p hysterectomy for h/o cervical cancer 1982 (both ovaries taken at that time) now on hormonal replacement   . Postmenopausal HRT (hormone replacement  therapy)   . PPD positive    history +PPD 1984, no treatment, no abnormal CXR  . Shortness of breath    with ambulation  . Stroke [facility])    TIA- April 2016 last one  . Supraventricular tachycardia, paroxysmal ([place])   . Urinary frequency   . Vaginal pruritus    Past Surgical History:  Procedure Laterality Date  . ABDOMINAL HYSTERECTOMY  1982  . CARDIOVASCULAR STRESS TEST  12/07/11   Normal nuclear stress test  . CATARACT EXTRACTION W/ INTRAOCULAR LENS IMPLANT  ?1997   right  . COLONOSCOPY    . EYE SURGERY     cataract removal right eye  . TOTAL ABDOMINAL HYSTERECTOMY W/ BILATERAL SALPINGOOPHORECTOMY  1982  . TOTAL KNEE ARTHROPLASTY  02/08/2012   Procedure: TOTAL KNEE ARTHROPLASTY;  Surgeon: Hessie Dibble, MD;  Location: [facility];  Service: Orthopedics;  Laterality: Left;   Family History  Problem Relation Age of Onset  . Breast cancer Mother 24  . Lung cancer Brother 36  . Cancer Brother        lung  . Heart failure Father        congestive  . Heart disease Father   . Epilepsy Son   . Kidney disease Son        tuberous sclerosis, both kidneys removed, has transplant  . Diabetes Maternal Uncle   . Diabetes Paternal Aunt   . Cancer Maternal Grandmother        breast  . Breast cancer Maternal Grandmother   . Heart disease Son        [place]  . COPD Son   . Rectal cancer Maternal Aunt   . Colon cancer Neg Hx   . Esophageal cancer Neg Hx   . Stomach cancer Neg Hx    Allergies as of 09/07/2020      Reactions   Benzonatate Nausea And Vomiting   "tessalon pearl"   Sulfonamide Derivatives Itching, Rash   Fosamax [alendronate]    Hctz [hydrochlorothiazide] Other (See Comments)   Severe hyponatremia    Lisinopril Cough      Medication List       Accurate as of Sep 07, 2020 11:59 PM. If you have any questions, ask your nurse or doctor.        albuterol 108 (90 Base) MCG/ACT inhaler Commonly known as: ProAir HFA Inhale 2 puffs into the lungs every 4 (four)  hours as needed for wheezing or shortness of breath.   amLODipine 5 MG tablet Commonly known as: NORVASC Take 1 tablet (5 mg total) by mouth daily.   aspirin EC 81 MG tablet Take 81 mg by mouth daily. Swallow whole.   cholecalciferol 25 MCG (1000 UNIT) tablet Commonly known as: VITAMIN D3 Take 1,000 Units by mouth daily. What changed: Another medication with the same name was removed. Continue taking this medication,  and follow the directions you see here. Changed by: Howard Pouch, DO   Cyanocobalamin 1000 MCG Lozg Take by mouth. What changed: Another medication with the same name was removed. Continue taking this medication, and follow the directions you see here. Changed by: Howard Pouch, DO   diclofenac sodium 1 % Gel Commonly known as: Voltaren Apply 2 g topically as needed.   fexofenadine 180 MG tablet Commonly known as: Allegra Allergy Take 1 tablet (180 mg total) by mouth daily.   levothyroxine 75 MCG tablet Commonly known as: SYNTHROID Take 1 tablet (75 mcg total) by mouth daily before breakfast.   losartan 25 MG tablet Commonly known as: COZAAR Take 0.5 tablets (12.5 mg total) by mouth daily.   omeprazole 10 MG capsule Commonly known as: PRILOSEC Take 1 capsule (10 mg total) by mouth as needed. What changed: additional instructions   simvastatin 10 MG tablet Commonly known as: ZOCOR Take 1 tablet (10 mg total) by mouth every evening. What changed:   how much to take  how to take this Changed by: Howard Pouch, DO       Results for orders placed or performed in visit on 09/07/20 (from the past 24 hour(s))  Vitamin D (25 hydroxy)     Status: None   Collection Time: 09/07/20  2:29 PM  Result Value Ref Range   Vit D, 25-Hydroxy 38 30 - 100 ng/mL  B12     Status: None   Collection Time: 09/07/20  2:29 PM  Result Value Ref Range   Vitamin B-12 1,062 200 - 1,100 pg/mL  Comp Met (CMET)     Status: None   Collection Time: 09/07/20  2:29 PM  Result Value  Ref Range   Glucose, Bld 92 65 - 99 mg/dL   BUN 8 7 - 25 mg/dL   Creat 0.73 0.60 - 0.88 mg/dL   BUN/Creatinine Ratio NOT APPLICABLE 6 - 22 (calc)   Sodium 136 135 - 146 mmol/L   Potassium 4.3 3.5 - 5.3 mmol/L   Chloride 101 98 - 110 mmol/L   CO2 22 20 - 32 mmol/L   Calcium 9.1 8.6 - 10.4 mg/dL   Total Protein 7.1 6.1 - 8.1 g/dL   Albumin 4.4 3.6 - 5.1 g/dL   Globulin 2.7 1.9 - 3.7 g/dL (calc)   AG Ratio 1.6 1.0 - 2.5 (calc)   Total Bilirubin 0.4 0.2 - 1.2 mg/dL   Alkaline phosphatase (APISO) 75 37 - 153 U/L   AST 14 10 - 35 U/L   ALT 8 6 - 29 U/L  TSH     Status: None   Collection Time: 09/07/20  2:29 PM  Result Value Ref Range   TSH 3.05 0.40 - 4.50 mIU/L  CBC     Status: None   Collection Time: 09/07/20  2:29 PM  Result Value Ref Range   WBC 5.8 3.8 - 10.8 Thousand/uL   RBC 4.76 3.80 - 5.10 Million/uL   Hemoglobin 13.7 11.7 - 15.5 g/dL   HCT 39.7 35.0 - 45.0 %   MCV 83.4 80.0 - 100.0 fL   MCH 28.8 27.0 - 33.0 pg   MCHC 34.5 32.0 - 36.0 g/dL   RDW 12.8 11.0 - 15.0 %   Platelets 238 140 - 400 Thousand/uL   MPV 10.5 7.5 - 12.5 fL  Direct LDL     Status: None   Collection Time: 09/07/20  2:29 PM  Result Value Ref Range   Direct LDL 71 <100 mg/dL  Hemoglobin A1c     Status: Abnormal   Collection Time: 09/07/20  2:29 PM  Result Value Ref Range   Hgb A1c MFr Bld 5.8 (H) <5.7 % of total Hgb   Mean Plasma Glucose 120 mg/dL   eAG (mmol/L) 6.6 mmol/L   No results found.   ROS: Negative, with the exception of above mentioned in HPI   Objective:  BP 127/65   Pulse 62   Temp 97.6 F (36.4 C) (Oral)   Ht '5\' 1"'  (1.549 m)   Wt 151 lb (68.5 kg)   SpO2 94%   BMI 28.53 kg/m  Body mass index is 28.53 kg/m. Gen: Afebrile. No acute distress.  Nontoxic, very pleasant female. HENT: AT. [place].  Eyes:Pupils Equal Round Reactive to light, Extraocular movements intact,  Conjunctiva without redness, discharge or icterus. Neck/lymp/endocrine: Supple,left ant sup enlarge lymph node  of neck. No axillary or supraclavicular lymphadenopathy, no thyromegaly CV: RRR 2/6 SM, no edema, +2/4 P posterior tibialis pulses Chest: CTAB, no wheeze or crackles Abd: Soft. NTND. BS present. no Masses palpated.  Skin: no rashes, purpura or petechiae.  Neuro:  Normal gait. PERLA. EOMi. Alert. Oriented x3 Psych: Normal affect, dress and demeanor. Normal speech. Normal thought content and judgment.    Assessment/Plan: Amy Rodriguez is a 87 y.o. female present for OV for follow-up on chronic medical conditions. SVT/TIA history/small MCA aneurysm/hypertension/hyperlipidemia/CVD/overweight Stable Continue amlodipine 5 mg daily. Continue losartan 12.5 mg daily. -Was on low-dose beta-blocker that has been discontinued secondary to decreased energy and low heart rate. -Echocardiogram: 09/2019 CBC, CMP, TSH and LDL collected today - continue  81 mg ASA.  - continue  simvastatin 10 mg QD  hypothyroidism TSH, T3 free, T4 free today -Continue levothyroxine 75 mcg daily if thyroid in normal range.  GERD:  Stable Continue Prilosec 10 mg daily PRN  vitamin D deficiency/B12 deficiency: Currently taking vitamin D 1000 units and B12 sublingual 1000 B12 and vitamin D levels collected today  Enlarged lymph node/fatigue: Noted large lymph node left of midline superior anterior neck today.  Patient states she thinks it has been there about 5-6 months.  She is uncertain if it has become larger, but she admits that has not decreased in size.  Not palpate any other lymphadenopathy as far as supraclavicular or axillary regions.   Will obtain neck ultrasound to further evaluate enlarged lymph node that has been present greater than 6 months. As far as chronic fatigue could also consider COPD as the cause.  She had been on routine daily inhalers in the past.  Last evaluated by pulmonology in 2019.    Reviewed expectations re: course of current medical issues.  Discussed self-management of  symptoms.  Outlined signs and symptoms indicating need for more acute intervention.  Patient verbalized understanding and all questions were answered.  Patient received an After-Visit Summary.    Orders Placed This Encounter  Procedures  . US Soft Tissue Head/Neck (NON-THYROID)  . Vitamin D (25 hydroxy)  . B12  . Comp Met (CMET)  . TSH  . CBC  . Direct LDL  . Hemoglobin A1c   Meds ordered this encounter  Medications  . amLODipine (NORVASC) 5 MG tablet    Sig: Take 1 tablet (5 mg total) by mouth daily.    Dispense:  90 tablet    Refill:  1    DC prior dose scripts please  . fexofenadine (ALLEGRA ALLERGY) 180 MG tablet    Sig: Take 1 tablet (180 mg  total) by mouth daily.    Dispense:  90 tablet    Refill:  3  . losartan (COZAAR) 25 MG tablet    Sig: Take 0.5 tablets (12.5 mg total) by mouth daily.    Dispense:  45 tablet    Refill:  1    If you could, would you please cut these in half for her. She has trouble doing it herself.  Marland Kitchen omeprazole (PRILOSEC) 10 MG capsule    Sig: Take 1 capsule (10 mg total) by mouth as needed.    Dispense:  90 capsule    Refill:  3    Hold until pt requests.  . simvastatin (ZOCOR) 10 MG tablet    Sig: Take 1 tablet (10 mg total) by mouth every evening.    Dispense:  90 tablet    Refill:  4  . levothyroxine (SYNTHROID) 75 MCG tablet    Sig: Take 1 tablet (75 mcg total) by mouth daily before breakfast.    Dispense:  90 tablet    Refill:  3    electronically signed by:  Howard Pouch, DO  [place]

## 2020-09-08 LAB — COMPREHENSIVE METABOLIC PANEL
AG Ratio: 1.6 (calc) (ref 1.0–2.5)
ALT: 8 U/L (ref 6–29)
AST: 14 U/L (ref 10–35)
Albumin: 4.4 g/dL (ref 3.6–5.1)
Alkaline phosphatase (APISO): 75 U/L (ref 37–153)
BUN: 8 mg/dL (ref 7–25)
CO2: 22 mmol/L (ref 20–32)
Calcium: 9.1 mg/dL (ref 8.6–10.4)
Chloride: 101 mmol/L (ref 98–110)
Creat: 0.73 mg/dL (ref 0.60–0.88)
Globulin: 2.7 g/dL (calc) (ref 1.9–3.7)
Glucose, Bld: 92 mg/dL (ref 65–99)
Potassium: 4.3 mmol/L (ref 3.5–5.3)
Sodium: 136 mmol/L (ref 135–146)
Total Bilirubin: 0.4 mg/dL (ref 0.2–1.2)
Total Protein: 7.1 g/dL (ref 6.1–8.1)

## 2020-09-08 LAB — CBC
HCT: 39.7 % (ref 35.0–45.0)
Hemoglobin: 13.7 g/dL (ref 11.7–15.5)
MCH: 28.8 pg (ref 27.0–33.0)
MCHC: 34.5 g/dL (ref 32.0–36.0)
MCV: 83.4 fL (ref 80.0–100.0)
MPV: 10.5 fL (ref 7.5–12.5)
Platelets: 238 10*3/uL (ref 140–400)
RBC: 4.76 10*6/uL (ref 3.80–5.10)
RDW: 12.8 % (ref 11.0–15.0)
WBC: 5.8 10*3/uL (ref 3.8–10.8)

## 2020-09-08 LAB — HEMOGLOBIN A1C
Hgb A1c MFr Bld: 5.8 % of total Hgb — ABNORMAL HIGH (ref <5.7)
Mean Plasma Glucose: 120 mg/dL
eAG (mmol/L): 6.6 mmol/L

## 2020-09-08 LAB — LDL CHOLESTEROL, DIRECT: Direct LDL: 71 mg/dL (ref <100)

## 2020-09-08 LAB — VITAMIN B12: Vitamin B-12: 1062 pg/mL (ref 200–1100)

## 2020-09-08 LAB — TSH: TSH: 3.05 mIU/L (ref 0.40–4.50)

## 2020-09-08 LAB — VITAMIN D 25 HYDROXY (VIT D DEFICIENCY, FRACTURES): Vit D, 25-Hydroxy: 38 ng/mL (ref 30–100)

## 2020-09-08 MED ORDER — LEVOTHYROXINE SODIUM 75 MCG PO TABS: 75.0000 ug | ORAL_TABLET | Freq: Every day | ORAL | 3 refills | Status: DC

## 2020-09-17 ENCOUNTER — Ambulatory Visit
Admission: RE | Admit: 2020-09-17 | Discharge: 2020-09-17 | Disposition: A | Discharge status: Home / Self Care | Payer: Medicare Other | Source: Ambulatory Visit | Attending: Family Medicine | Admitting: Family Medicine

## 2020-09-17 ENCOUNTER — Other Ambulatory Visit: Payer: Self-pay

## 2020-09-17 DIAGNOSIS — M85852 Other specified disorders of bone density and structure, left thigh: Secondary | ICD-10-CM | POA: Diagnosis not present

## 2020-09-17 DIAGNOSIS — Z780 Asymptomatic menopausal state: Secondary | ICD-10-CM | POA: Diagnosis not present

## 2020-09-17 DIAGNOSIS — Z1231 Encounter for screening mammogram for malignant neoplasm of breast: Secondary | ICD-10-CM

## 2020-09-17 DIAGNOSIS — M8580 Other specified disorders of bone density and structure, unspecified site: Secondary | ICD-10-CM

## 2020-09-17 DIAGNOSIS — M810 Age-related osteoporosis without current pathological fracture: Secondary | ICD-10-CM | POA: Diagnosis not present

## 2020-09-20 ENCOUNTER — Telehealth: Payer: Self-pay | Admitting: Family Medicine

## 2020-09-20 NOTE — Telephone Encounter (Signed)
Please call patient: Her bone density result has been completed and shows decreased bone density since her last scan.  She is now in the osteoporotic range, which puts her at risk for fractures.  She has been unable to tolerate Fosamax in the past.  There is an every 40-month injection called Prolia that is used to treat osteoporosis.  If she would like to try this medication we will see if we can get it approved for her through her insurance.  Lorriane Shire: If she would like to start Prolia can you start process. Thanks.    Basic recommendations for osteoporosis: 1.) Drink alcohol in moderation only.  2.) Decrease caffeine consumption to no  More than 2.5 cups of coffee a day or nor more than 5 cups of tea a day. 3.) Exercise: weight bearing (walking counts), strength and balance training. 4.) No smoking.  5.) Sunlight/Ultraviolet light exposure 30 minutes a day/5 days a week. 6.)  Continue vitamin D supplementation

## 2020-09-21 ENCOUNTER — Ambulatory Visit
Admission: RE | Admit: 2020-09-21 | Discharge: 2020-09-21 | Disposition: A | Discharge status: Home / Self Care | Payer: Medicare Other | Source: Ambulatory Visit | Attending: Family Medicine | Admitting: Family Medicine

## 2020-09-21 DIAGNOSIS — R591 Generalized enlarged lymph nodes: Secondary | ICD-10-CM

## 2020-09-21 DIAGNOSIS — R221 Localized swelling, mass and lump, neck: Secondary | ICD-10-CM | POA: Diagnosis not present

## 2020-09-21 NOTE — Telephone Encounter (Signed)
Spoke with patient regarding results/recommendations.  

## 2020-09-21 NOTE — Telephone Encounter (Signed)
LM for pt to return call to discuss.  

## 2020-09-21 NOTE — Telephone Encounter (Signed)
Prolia PA started

## 2020-10-22 DIAGNOSIS — H52203 Unspecified astigmatism, bilateral: Secondary | ICD-10-CM | POA: Diagnosis not present

## 2020-10-22 DIAGNOSIS — H5213 Myopia, bilateral: Secondary | ICD-10-CM | POA: Diagnosis not present

## 2020-10-22 DIAGNOSIS — H2512 Age-related nuclear cataract, left eye: Secondary | ICD-10-CM | POA: Diagnosis not present

## 2020-11-24 ENCOUNTER — Other Ambulatory Visit: Payer: Self-pay

## 2020-11-24 ENCOUNTER — Ambulatory Visit (INDEPENDENT_AMBULATORY_CARE_PROVIDER_SITE_OTHER): Payer: Medicare Other | Admitting: *Deleted

## 2020-11-24 DIAGNOSIS — Z0000 Encounter for general adult medical examination without abnormal findings: Secondary | ICD-10-CM | POA: Diagnosis not present

## 2020-11-24 NOTE — Progress Notes (Signed)
Subjective:   Amy Rodriguez is a 87 y.o. female who presents for Medicare Annual (Subsequent) preventive examination.  I connected with  Amy Rodriguez on 11/24/20 by a telephone enabled telemedicine application and verified that I am speaking with the correct person using two identifiers.   I discussed the limitations of evaluation and management by telemedicine. The patient expressed understanding and agreed to proceed.   Review of Systems    na       Objective:    There were no vitals filed for this visit. There is no height or weight on file to calculate BMI.  Advanced Directives 08/14/2017 08/07/2016 10/07/2015 09/21/2015 08/03/2015 08/03/2015 03/01/2014  Does Patient Have a Medical Advance Directive? Yes Yes No No - Yes No  Type of Paramedic of [place];Living will Living will - - - [place];Living will -  Copy of [place] in Chart? No - copy requested - - - No - copy requested Yes -  Would patient like information on creating a medical advance directive? - - - No - patient declined information - - Yes - Educational materials given    Current Medications (verified) Outpatient Encounter Medications as of 11/24/2020  Medication Sig   albuterol (PROAIR HFA) 108 (90 Base) MCG/ACT inhaler Inhale 2 puffs into the lungs every 4 (four) hours as needed for wheezing or shortness of breath.   amLODipine (NORVASC) 5 MG tablet Take 1 tablet (5 mg total) by mouth daily.   aspirin EC 81 MG tablet Take 81 mg by mouth daily. Swallow whole.   cholecalciferol (VITAMIN D3) 25 MCG (1000 UNIT) tablet Take 1,000 Units by mouth daily.   Cyanocobalamin 1000 MCG LOZG Take by mouth.   diclofenac sodium (VOLTAREN) 1 % GEL Apply 2 g topically as needed.   fexofenadine (ALLEGRA ALLERGY) 180 MG tablet Take 1 tablet (180 mg total) by mouth daily.   levothyroxine (SYNTHROID) 75 MCG tablet Take 1 tablet (75 mcg total) by mouth daily before  breakfast.   losartan (COZAAR) 25 MG tablet Take 0.5 tablets (12.5 mg total) by mouth daily.   omeprazole (PRILOSEC) 10 MG capsule Take 1 capsule (10 mg total) by mouth as needed.   simvastatin (ZOCOR) 10 MG tablet Take 1 tablet (10 mg total) by mouth every evening.   No facility-administered encounter medications on file as of 11/24/2020.    Allergies (verified) Benzonatate, Sulfonamide derivatives, Fosamax [alendronate], Hctz [hydrochlorothiazide], and Lisinopril   History: Past Medical History:  Diagnosis Date   Arthritis    sed rate 10, RF, CCP pending   Blood in stool    Cervical cancer (HCC)    s/p hysterectomy/oop   DDD (degenerative disc disease), lumbosacral    Depression    Due to husbands passing away 09/22/2011   Diverticulosis of colon    Environmental allergies    GERD (gastroesophageal reflux disease)    H pylori ulcer    not treated due to expense  10/2001   Hand dermatitis    HTN (hypertension)    Hyperlipidemia    Hypothyroidism    TSH 14.488 (11/2005)   IBS (irritable bowel syndrome)    Lactose intolerance    Migraines    "get them very rarely now" (02/08/2012)   Multiple pigmented nevi    last derm evaluation 01/2003   OA (osteoarthritis)    multiple sites   Other seborrheic keratosis    Pain in joint, lower leg    Pneumonia    "  couple times in my lifetime" (02/08/2012)   PONV (postoperative nausea and vomiting)    "and takes me a long time to come out under it" (02/08/2012)   Posterior vitreous detachment 1996   Postmenopausal    s/p hysterectomy for h/o cervical cancer 1982 (both ovaries taken at that time) now on hormonal replacement    Postmenopausal HRT (hormone replacement therapy)    PPD positive    history +PPD 1984, no treatment, no abnormal CXR   Shortness of breath    with ambulation   Stroke [facility])    TIA- April 2016 last one   Supraventricular tachycardia, paroxysmal ([place])    Urinary frequency    Vaginal pruritus    Past Surgical  History:  Procedure Laterality Date   ABDOMINAL HYSTERECTOMY  1982   CARDIOVASCULAR STRESS TEST  12/07/11   Normal nuclear stress test   CATARACT EXTRACTION W/ INTRAOCULAR LENS IMPLANT  ?1997   right   COLONOSCOPY     EYE SURGERY     cataract removal right eye   TOTAL ABDOMINAL HYSTERECTOMY W/ BILATERAL SALPINGOOPHORECTOMY  1982   TOTAL KNEE ARTHROPLASTY  02/08/2012   Procedure: TOTAL KNEE ARTHROPLASTY;  Surgeon: Hessie Dibble, MD;  Location: [facility];  Service: Orthopedics;  Laterality: Left;   Family History  Problem Relation Age of Onset   Breast cancer Mother 15   Lung cancer Brother 32   Cancer Brother        lung   Heart failure Father        congestive   Heart disease Father    Epilepsy Son    Kidney disease Son        tuberous sclerosis, both kidneys removed, has transplant   Diabetes Maternal Uncle    Diabetes Paternal Aunt    Cancer Maternal Grandmother        breast   Breast cancer Maternal Grandmother    Heart disease Son        [place] white   COPD Son    Rectal cancer Maternal Aunt    Colon cancer Neg Hx    Esophageal cancer Neg Hx    Stomach cancer Neg Hx    Social History   Socioeconomic History   Marital status: Widowed    Spouse name: Not on file   Number of children: 3   Years of education: 12TH   Highest education level: Not on file  Occupational History   Occupation: retired    Comment: Academic librarian: RETIRED  Tobacco Use   Smoking status: Former    Packs/day: 1.00    Years: 25.00    Pack years: 25.00    Types: Cigarettes    Quit date: 04/17/1980    Years since quitting: 40.6   Smokeless tobacco: Never   Tobacco comments:    passive smoker, husband smoked  Vaping Use   Vaping Use: Never used  Substance and Sexual Activity   Alcohol use: No   Drug use: No   Sexual activity: Never  Other Topics Concern   Not on file  Social History Narrative   Lives with son, Amy Rodriguez who has medical problems-they help each other. Her  husband died 10-27-2022 after 50 years of marriage.  She has 2 other sons that live within 2 hrs from her. She does not drive-she uses [facility] [place] [facility] [facility]. She used to work in [employer] research (medical), retired.     Tobacco: 25 pack yr hx, quit 1985.   Social Determinants of Health  Financial Resource Strain: Not on file  Food Insecurity: Not on file  Transportation Needs: Not on file  Physical Activity: Not on file  Stress: Not on file  Social Connections: Not on file    Tobacco Counseling Counseling given: Not Answered Tobacco comments: passive smoker, husband smoked   Clinical Intake:                 Diabetic?no         Activities of Daily Living No flowsheet data found.  Patient Care Team: Ma Hillock, DO as PCP - General (Family Medicine) Shon Hough, MD as Consulting Physician (Ophthalmology) Lamonte Sakai Rose Fillers, MD as Consulting Physician (Pulmonary Disease) Stanford Breed Denice Bors, MD as Consulting Physician (Cardiology) Garvin Fila, MD as Consulting Physician (Neurology) Inda Castle, MD (Inactive) as Consulting Physician (Gastroenterology) Philemon Kingdom, MD as Consulting Physician (Internal Medicine) Melrose Nakayama, MD as Consulting Physician (Orthopedic Surgery)  Indicate any recent [place] you may have received from other than [facility] providers in the past year (date may be approximate).     Assessment:   This is a routine wellness examination for [place].  Hearing/Vision screen No results found.  Dietary issues and exercise activities discussed:     Goals Addressed   None    Depression Screen PHQ 2/9 Scores 03/23/2020 09/27/2018 08/14/2017 08/07/2016 08/03/2015 07/08/2014 12/05/2011  PHQ - 2 Score 0 0 0 0 0 0 1    Fall Risk Fall Risk  03/23/2020 09/27/2018 09/03/2017 08/14/2017 08/07/2016  Falls in the past year? 0 0 Yes Yes No  Number falls in past yr: 0 0 2 or more 2 or more -  Injury with Fall? 0 0 No No -  Risk Factor Category  - - High Fall  Risk High Fall Risk -  Risk for fall due to : - - - History of fall(s) -  Follow up Falls evaluation completed Falls evaluation completed - Falls prevention discussed -    FALL RISK PREVENTION PERTAINING TO THE HOME:  Any stairs in or around the home? Yes  If so, are there any without handrails? Yes  Home free of loose throw rugs in walkways, pet beds, electrical cords, etc? Yes  Adequate lighting in your home to reduce risk of falls? Yes   ASSISTIVE DEVICES UTILIZED TO PREVENT FALLS:  Life alert? No  Use of a cane, walker or w/c? No  Grab bars in the bathroom? No  Shower chair or bench in shower? No  Elevated toilet seat or a handicapped toilet? No   TIMED UP AND GO:  Was the test performed? No .    Cognitive Function:  Normal cognitive status assessed by direct observation by this Nurse Health Advisor. No abnormalities found.     MMSE - Mini Mental State Exam 08/14/2017 08/07/2016  Orientation to time 5 5  Orientation to Place 5 5  Registration 3 3  Attention/ Calculation 5 5  Recall 3 3  Language- name 2 objects 2 2  Language- repeat 1 1  Language- follow 3 step command 3 3  Language- read & follow direction 1 1  Write a sentence 1 1  Copy design 0 1  Total score 29 30        Immunizations Immunization History  Administered Date(s) Administered   Fluad Quad(high Dose 65+) 02/06/2019, 02/10/2020   Influenza Split 01/12/2011, 02/09/2012   Influenza Whole 02/08/2007, 01/24/2008, 12/16/2008, 01/27/2009, 02/17/2010   Influenza, High Dose Seasonal PF 01/16/2017, 01/25/2018   Influenza,inj,Quad  PF,6+ Mos 12/17/2012, 12/19/2013, 01/08/2015, 01/06/2016   PFIZER(Purple Top)SARS-COV-2 Vaccination 07/02/2019, 07/23/2019, 03/23/2020   Pneumococcal Conjugate-13 07/08/2014   Pneumococcal Polysaccharide-23 05/23/2004   Td 06/16/2002   Tdap 12/17/2012   Zoster, Live 12/17/2012    TDAP status: Up to date  Flu Vaccine status: Up to date  Pneumococcal vaccine status:  Up to date  Covid-19 vaccine status: Information provided on how to obtain vaccines.   Qualifies for Shingles Vaccine? Yes   Zostavax completed Yes   Shingrix Completed?: No.    Education has been provided regarding the importance of this vaccine. Patient has been advised to call insurance company to determine out of pocket expense if they have not yet received this vaccine. Advised may also receive vaccine at local pharmacy or Health Dept. Verbalized acceptance and understanding.  Screening Tests Health Maintenance  Topic Date Due   Zoster Vaccines- Shingrix (1 of 2) Never done   COVID-19 Vaccine (4 - Booster for Pfizer series) 07/22/2020   INFLUENZA VACCINE  11/15/2020   MAMMOGRAM  09/17/2021   DEXA SCAN  09/18/2022   TETANUS/TDAP  12/18/2022   PNA vac Low Risk Adult  Completed   HPV VACCINES  Aged Out    Health Maintenance  Health Maintenance Due  Topic Date Due   Zoster Vaccines- Shingrix (1 of 2) Never done   COVID-19 Vaccine (4 - Booster for Pfizer series) 07/22/2020   INFLUENZA VACCINE  11/15/2020    Colorectal cancer screening: No longer required.   Mammogram status: Completed  . Repeat every year  Bone Density status: Completed  . Results reflect: Bone density results: OSTEOPOROSIS. Repeat every 2 years.  Lung Cancer Screening: (Low Dose CT Chest recommended if Age 72-80 years, 30 pack-year currently smoking OR have quit w/in 15years.) does not qualify.   Lung Cancer Screening Referral:   Additional Screening:  Hepatitis C Screening: does not qualify; Completed   Vision Screening: Recommended annual ophthalmology exams for early detection of glaucoma and other disorders of the eye. Is the patient up to date with their annual eye exam?  Yes  Who is the provider or what is the name of the office in which the patient attends annual eye exams? Dr. Annamaria Boots If pt is not established with a provider, would they like to be referred to a provider to establish care? No .    Dental Screening: Recommended annual dental exams for proper oral hygiene  Community Resource Referral / Chronic Care Management: CRR required this visit?  No   CCM required this visit?  No      Plan:     I have personally reviewed and noted the following in the patient's chart:   Medical and social history Use of alcohol, tobacco or illicit drugs  Current medications and supplements including opioid prescriptions.  Functional ability and status Nutritional status Physical activity Advanced directives List of other physicians Hospitalizations, surgeries, and ER visits in previous 12 months Vitals Screenings to include cognitive, depression, and falls Referrals and appointments  In addition, I have reviewed and discussed with patient certain preventive protocols, quality metrics, and best practice recommendations. A written personalized care plan for preventive services as well as general preventive health recommendations were provided to patient.     Leroy Kennedy, LPN   [id number]   Nurse Notes: na

## 2020-11-24 NOTE — Patient Instructions (Signed)
Amy Rodriguez , Thank you for taking time to come for your Medicare Wellness Visit. I appreciate your ongoing commitment to your health goals. Please review the following plan we discussed and let me know if I can assist you in the future.   Screening recommendations/referrals: Colonoscopy: no longer required Mammogram: up to date Bone Density: up to date Recommended yearly ophthalmology/optometry visit for glaucoma screening and checkup Recommended yearly dental visit for hygiene and checkup  Vaccinations: Influenza vaccine: up to date Pneumococcal vaccine: up to date Tdap vaccine: up to date Shingles vaccine: Education provided    Advanced directives: copy requested  Conditions/risks identified: na  Next appointment: 01-19-2021 @ 2:00 Dr. Raoul Pitch   Preventive Care 85 Years and Older, Female Preventive care refers to lifestyle choices and visits with your health care provider that can promote health and wellness. What does preventive care include? A yearly physical exam. This is also called an annual well check. Dental exams once or twice a year. Routine eye exams. Ask your health care provider how often you should have your eyes checked. Personal lifestyle choices, including: Daily care of your teeth and gums. Regular physical activity. Eating a healthy diet. Avoiding tobacco and drug use. Limiting alcohol use. Practicing safe sex. Taking low-dose aspirin every day. Taking vitamin and mineral supplements as recommended by your health care provider. What happens during an annual well check? The services and screenings done by your health care provider during your annual well check will depend on your age, overall health, lifestyle risk factors, and family history of disease. Counseling  Your health care provider may ask you questions about your: Alcohol use. Tobacco use. Drug use. Emotional well-being. Home and relationship well-being. Sexual activity. Eating  habits. History of falls. Memory and ability to understand (cognition). Work and work Statistician. Reproductive health. Screening  You may have the following tests or measurements: Height, weight, and BMI. Blood pressure. Lipid and cholesterol levels. These may be checked every 5 years, or more frequently if you are over 85 years old. Skin check. Lung cancer screening. You may have this screening every year starting at age 85 if you have a 30-pack-year history of smoking and currently smoke or have quit within the past 15 years. Fecal occult blood test (FOBT) of the stool. You may have this test every year starting at age 85. Flexible sigmoidoscopy or colonoscopy. You may have a sigmoidoscopy every 5 years or a colonoscopy every 10 years starting at age 85. Hepatitis C blood test. Hepatitis B blood test. Sexually transmitted disease (STD) testing. Diabetes screening. This is done by checking your blood sugar (glucose) after you have not eaten for a while (fasting). You may have this done every 1-3 years. Bone density scan. This is done to screen for osteoporosis. You may have this done starting at age 85. Mammogram. This may be done every 1-2 years. Talk to your health care provider about how often you should have regular mammograms. Talk with your health care provider about your test results, treatment options, and if necessary, the need for more tests. Vaccines  Your health care provider may recommend certain vaccines, such as: Influenza vaccine. This is recommended every year. Tetanus, diphtheria, and acellular pertussis (Tdap, Td) vaccine. You may need a Td booster every 10 years. Zoster vaccine. You may need this after age 85. Pneumococcal 13-valent conjugate (PCV13) vaccine. One dose is recommended after age 85. Pneumococcal polysaccharide (PPSV23) vaccine. One dose is recommended after age 85. Talk to your health care  provider about which screenings and vaccines you need and how  often you need them. This information is not intended to replace advice given to you by your health care provider. Make sure you discuss any questions you have with your health care provider. Document Released: 04/30/2015 Document Revised: 12/22/2015 Document Reviewed: 02/02/2015 Elsevier Interactive Patient Education  2017 [place] Prevention in the Home Falls can cause injuries. They can happen to people of all ages. There are many things you can do to make your home safe and to help prevent falls. What can I do on the outside of my home? Regularly fix the edges of walkways and driveways and fix any cracks. Remove anything that might make you trip as you walk through a door, such as a raised step or threshold. Trim any bushes or trees on the path to your home. Use bright outdoor lighting. Clear any walking paths of anything that might make someone trip, such as rocks or tools. Regularly check to see if handrails are loose or broken. Make sure that both sides of any steps have handrails. Any raised decks and porches should have guardrails on the edges. Have any leaves, snow, or ice cleared regularly. Use sand or salt on walking paths during winter. Clean up any spills in your garage right away. This includes oil or grease spills. What can I do in the bathroom? Use night lights. Install grab bars by the toilet and in the tub and shower. Do not use towel bars as grab bars. Use non-skid mats or decals in the tub or shower. If you need to sit down in the shower, use a plastic, non-slip stool. Keep the floor dry. Clean up any water that spills on the floor as soon as it happens. Remove soap buildup in the tub or shower regularly. Attach bath mats securely with double-sided non-slip rug tape. Do not have throw rugs and other things on the floor that can make you trip. What can I do in the bedroom? Use night lights. Make sure that you have a light by your bed that is easy to  reach. Do not use any sheets or blankets that are too big for your bed. They should not hang down onto the floor. Have a firm chair that has side arms. You can use this for support while you get dressed. Do not have throw rugs and other things on the floor that can make you trip. What can I do in the kitchen? Clean up any spills right away. Avoid walking on wet floors. Keep items that you use a lot in easy-to-reach places. If you need to reach something above you, use a strong step stool that has a grab bar. Keep electrical cords out of the way. Do not use floor polish or wax that makes floors slippery. If you must use wax, use non-skid floor wax. Do not have throw rugs and other things on the floor that can make you trip. What can I do with my stairs? Do not leave any items on the stairs. Make sure that there are handrails on both sides of the stairs and use them. Fix handrails that are broken or loose. Make sure that handrails are as long as the stairways. Check any carpeting to make sure that it is firmly attached to the stairs. Fix any carpet that is loose or worn. Avoid having throw rugs at the top or bottom of the stairs. If you do have throw rugs, attach them to the  floor with carpet tape. Make sure that you have a light switch at the top of the stairs and the bottom of the stairs. If you do not have them, ask someone to add them for you. What else can I do to help prevent falls? Wear shoes that: Do not have high heels. Have rubber bottoms. Are comfortable and fit you well. Are closed at the toe. Do not wear sandals. If you use a stepladder: Make sure that it is fully opened. Do not climb a closed stepladder. Make sure that both sides of the stepladder are locked into place. Ask someone to hold it for you, if possible. Clearly mark and make sure that you can see: Any grab bars or handrails. First and last steps. Where the edge of each step is. Use tools that help you move  around (mobility aids) if they are needed. These include: Canes. Walkers. Scooters. Crutches. Turn on the lights when you go into a dark area. Replace any light bulbs as soon as they burn out. Set up your furniture so you have a clear path. Avoid moving your furniture around. If any of your floors are uneven, fix them. If there are any pets around you, be aware of where they are. Review your medicines with your doctor. Some medicines can make you feel dizzy. This can increase your chance of falling. Ask your doctor what other things that you can do to help prevent falls. This information is not intended to replace advice given to you by your health care provider. Make sure you discuss any questions you have with your health care provider. Document Released: 01/28/2009 Document Revised: 09/09/2015 Document Reviewed: 05/08/2014 Elsevier Interactive Patient Education  2017 [employer].

## 2021-01-19 ENCOUNTER — Encounter: Payer: Self-pay | Admitting: Family Medicine

## 2021-01-19 ENCOUNTER — Other Ambulatory Visit: Payer: Self-pay

## 2021-01-19 ENCOUNTER — Ambulatory Visit (INDEPENDENT_AMBULATORY_CARE_PROVIDER_SITE_OTHER): Payer: Medicare Other | Admitting: Family Medicine

## 2021-01-19 VITALS — BP 116/64 | HR 57 | Temp 97.6°F | Ht 61.0 in | Wt 150.0 lb

## 2021-01-19 DIAGNOSIS — Z23 Encounter for immunization: Secondary | ICD-10-CM | POA: Diagnosis not present

## 2021-01-19 DIAGNOSIS — E538 Deficiency of other specified B group vitamins: Secondary | ICD-10-CM

## 2021-01-19 DIAGNOSIS — E038 Other specified hypothyroidism: Secondary | ICD-10-CM

## 2021-01-19 DIAGNOSIS — E559 Vitamin D deficiency, unspecified: Secondary | ICD-10-CM

## 2021-01-19 DIAGNOSIS — E663 Overweight: Secondary | ICD-10-CM

## 2021-01-19 DIAGNOSIS — I422 Other hypertrophic cardiomyopathy: Secondary | ICD-10-CM | POA: Diagnosis not present

## 2021-01-19 DIAGNOSIS — G459 Transient cerebral ischemic attack, unspecified: Secondary | ICD-10-CM | POA: Diagnosis not present

## 2021-01-19 DIAGNOSIS — I10 Essential (primary) hypertension: Secondary | ICD-10-CM

## 2021-01-19 DIAGNOSIS — E7800 Pure hypercholesterolemia, unspecified: Secondary | ICD-10-CM | POA: Diagnosis not present

## 2021-01-19 DIAGNOSIS — I729 Aneurysm of unspecified site: Secondary | ICD-10-CM

## 2021-01-19 DIAGNOSIS — I5189 Other ill-defined heart diseases: Secondary | ICD-10-CM

## 2021-01-19 DIAGNOSIS — I471 Supraventricular tachycardia: Secondary | ICD-10-CM

## 2021-01-19 DIAGNOSIS — E063 Autoimmune thyroiditis: Secondary | ICD-10-CM

## 2021-01-19 MED ORDER — LOSARTAN POTASSIUM 25 MG PO TABS: 12.5000 mg | ORAL_TABLET | Freq: Every day | ORAL | 1 refills | Status: DC

## 2021-01-19 MED ORDER — AMLODIPINE BESYLATE 5 MG PO TABS: 5.0000 mg | ORAL_TABLET | Freq: Every day | ORAL | 1 refills | Status: DC

## 2021-01-19 NOTE — Progress Notes (Signed)
[name] , Apr 12, 1934, 87 y.o., female MRN: [id number] Patient Care Team    Relationship Specialty Notifications Start End  Ma Hillock, DO PCP - General Family Medicine  01/08/15   Shon Hough, MD Consulting Physician Ophthalmology  08/03/15   Collene Gobble, MD Consulting Physician Pulmonary Disease  08/03/15   Lelon Perla, MD Consulting Physician Cardiology  08/03/15   Garvin Fila, MD Consulting Physician Neurology  08/03/15   Inda Castle, MD (Inactive) Consulting Physician Gastroenterology  08/04/15   Philemon Kingdom, MD Consulting Physician Internal Medicine  08/07/16   Melrose Nakayama, MD Consulting Physician Orthopedic Surgery  08/07/16     Chief Complaint  Patient presents with   Hypertension    [place]; pt is not fasting    Subjective: Amy Rodriguez is a 87 y.o. female present for [facility] follow up  Hypertension/hyperlipidemia/overweight/tachycardia/CAD: Patient reports compliance with amlodipine 5 mg daily and losartan 12.5 mg.  Patient reports compliance with daily baby aspirin and Zocor 10 mg daily. Patient does have a small MCA aneurysm, SVT, history of stroke/tia and is followed by cardiology.  She had been on atenolol low-dose and this was discontinued recently by cardiology.  She also been tried on lisinopril but noticed a tickle cough and changed to losartan. Patient denies chest pain, shortness of breath, dizziness or lower extremity edema.   osteopenia, unspecified location Up-to-date 09/17/2020  B12 deficiency/ Vitamin D deficiency Patient reports she has continue to supplementation and feels much improved.  Aneurysm (HCC)/Apical variant hypertrophic cardiomyopathy ([place]) Follows with cardiology  Hypothyroid: Patient reports compliance with levothyroxine 75 g daily.  Labs are up-to-date  GERD: Patient reports she takes Prilosec 10 mg as needed  Medication works well.   Depression screen [facility] 2/9 01/19/2021 03/23/2020 09/27/2018 08/14/2017  08/07/2016  Decreased Interest 0 0 0 0 0  Down, Depressed, Hopeless 0 - 0 0 0  PHQ - 2 Score 0 0 0 0 0  Some recent data might be hidden    Allergies  Allergen Reactions   Benzonatate Nausea And Vomiting    "tessalon pearl"   Sulfonamide Derivatives Itching and Rash   Fosamax [Alendronate]    Hctz [Hydrochlorothiazide] Other (See Comments)    Severe hyponatremia    Lisinopril Cough   Social History   Tobacco Use   Smoking status: Former    Packs/day: 1.00    Years: 25.00    Pack years: 25.00    Types: Cigarettes    Quit date: 04/17/1980    Years since quitting: 40.7   Smokeless tobacco: Never   Tobacco comments:    passive smoker, husband smoked  Substance Use Topics   Alcohol use: No   Past Medical History:  Diagnosis Date   Arthritis    sed rate 10, RF, CCP pending   Blood in stool    Cervical cancer (HCC)    s/p hysterectomy/oop   DDD (degenerative disc disease), lumbosacral    Depression    Due to husbands passing away 09/22/2011   Diverticulosis of colon    Environmental allergies    GERD (gastroesophageal reflux disease)    H pylori ulcer    not treated due to expense  10/2001   Hand dermatitis    HTN (hypertension)    Hyperlipidemia    Hypothyroidism    TSH 14.488 (11/2005)   IBS (irritable bowel syndrome)    Lactose intolerance    Migraines    "get them very rarely now" (02/08/2012)  Multiple pigmented nevi    last derm evaluation 01/2003   OA (osteoarthritis)    multiple sites   Other seborrheic keratosis    Pain in joint, lower leg    Pneumonia    "couple times in my lifetime" (02/08/2012)   PONV (postoperative nausea and vomiting)    "and takes me a long time to come out under it" (02/08/2012)   Posterior vitreous detachment 1996   Postmenopausal    s/p hysterectomy for h/o cervical cancer 1982 (both ovaries taken at that time) now on hormonal replacement    Postmenopausal HRT (hormone replacement therapy)    PPD positive    history +PPD  1984, no treatment, no abnormal CXR   Shortness of breath    with ambulation   Stroke [facility])    TIA- April 2016 last one   Supraventricular tachycardia, paroxysmal ([place])    Urinary frequency    Vaginal pruritus    Past Surgical History:  Procedure Laterality Date   ABDOMINAL HYSTERECTOMY  1982   CARDIOVASCULAR STRESS TEST  12/07/11   Normal nuclear stress test   CATARACT EXTRACTION W/ INTRAOCULAR LENS IMPLANT  ?1997   right   COLONOSCOPY     EYE SURGERY     cataract removal right eye   TOTAL ABDOMINAL HYSTERECTOMY W/ BILATERAL SALPINGOOPHORECTOMY  1982   TOTAL KNEE ARTHROPLASTY  02/08/2012   Procedure: TOTAL KNEE ARTHROPLASTY;  Surgeon: Hessie Dibble, MD;  Location: [place];  Service: Orthopedics;  Laterality: Left;   Family History  Problem Relation Age of Onset   Breast cancer Mother 67   Lung cancer Brother 69   Cancer Brother        lung   Heart failure Father        congestive   Heart disease Father    Epilepsy Son    Kidney disease Son        tuberous sclerosis, both kidneys removed, has transplant   Diabetes Maternal Uncle    Diabetes Paternal Aunt    Cancer Maternal Grandmother        breast   Breast cancer Maternal Grandmother    Heart disease Son        [place] white   COPD Son    Rectal cancer Maternal Aunt    Colon cancer Neg Hx    Esophageal cancer Neg Hx    Stomach cancer Neg Hx    Allergies as of 01/19/2021       Reactions   Benzonatate Nausea And Vomiting   "tessalon pearl"   Sulfonamide Derivatives Itching, Rash   Fosamax [alendronate]    Hctz [hydrochlorothiazide] Other (See Comments)   Severe hyponatremia    Lisinopril Cough        Medication List        Accurate as of January 19, 2021  3:08 PM. If you have any questions, ask your nurse or doctor.          albuterol 108 (90 Base) MCG/ACT inhaler Commonly known as: ProAir HFA Inhale 2 puffs into the lungs every 4 (four) hours as needed for wheezing or shortness of breath.    amLODipine 5 MG tablet Commonly known as: NORVASC Take 1 tablet (5 mg total) by mouth daily.   aspirin EC 81 MG tablet Take 81 mg by mouth daily. Swallow whole.   cholecalciferol 25 MCG (1000 UNIT) tablet Commonly known as: VITAMIN D3 Take 1,000 Units by mouth daily.   Cyanocobalamin 1000 MCG Lozg Take by mouth.  diclofenac sodium 1 % Gel Commonly known as: Voltaren Apply 2 g topically as needed.   fexofenadine 180 MG tablet Commonly known as: Allegra Allergy Take 1 tablet (180 mg total) by mouth daily.   levothyroxine 75 MCG tablet Commonly known as: SYNTHROID Take 1 tablet (75 mcg total) by mouth daily before breakfast.   losartan 25 MG tablet Commonly known as: COZAAR Take 0.5 tablets (12.5 mg total) by mouth daily.   omeprazole 10 MG capsule Commonly known as: PRILOSEC Take 1 capsule (10 mg total) by mouth as needed.   simvastatin 10 MG tablet Commonly known as: ZOCOR Take 1 tablet (10 mg total) by mouth every evening.        No results found for this or any previous visit (from the past 24 hour(s)).  No results found.   ROS: Negative, with the exception of above mentioned in HPI   Objective:  BP 116/64   Pulse (!) 57   Temp 97.6 F (36.4 C) (Oral)   Ht 5\' 1"  (1.549 m)   Wt 150 lb (68 kg)   SpO2 96%   BMI 28.34 kg/m  Body mass index is 28.34 kg/m. Gen: Afebrile. No acute distress.  Nontoxic, very pleasant female. HENT: AT. [place].  No cough.  No hoarseness. Eyes:Pupils Equal Round Reactive to light, Extraocular movements intact,  Conjunctiva without redness, discharge or icterus. Neck/lymp/endocrine: Supple, no lymphadenopathy, no thyromegaly CV: RRR 1/6 systolic murmur, no edema, Chest: CTAB, no wheeze or crackles Skin: No rashes, purpura or petechiae.  Neuro:Normal gait. PERLA. EOMi. Alert. Oriented x3 Psych: Normal affect, dress and demeanor. Normal speech. Normal thought content and judgment.  Assessment/Plan: Amy Rodriguez is a 87  y.o. female present for OV for follow-up on chronic medical conditions. SVT/TIA history/small MCA aneurysm/hypertension/hyperlipidemia/CVD/overweight Stable Continue amlodipine 5 mg daily. Continue losartan 12.5 mg daily. -Was on low-dose beta-blocker that has been discontinued secondary to decreased energy and low heart rate. -Echocardiogram: 09/2019 Labs up-to-date -Continue 81 mg ASA.  -Continue simvastatin 10 mg QD  hypothyroidism Labs up-to-date -Continue levothyroxine 75 mcg daily if thyroid in normal range.  GERD:  Stable Continue Prilosec 10 mg daily PRN  vitamin D deficiency/B12 deficiency: Currently taking vitamin D 1000 units and B12 sublingual 1000 Labs greatly improved.  We will check yearly with physicals.  Influenza vaccination provided today  Reviewed expectations re: course of current medical issues. Discussed self-management of symptoms. Outlined signs and symptoms indicating need for more acute intervention. Patient verbalized understanding and all questions were answered. Patient received an After-Visit Summary.    Orders Placed This Encounter  Procedures   Flu Vaccine QUAD High Dose(Fluad)   Meds ordered this encounter  Medications   amLODipine (NORVASC) 5 MG tablet    Sig: Take 1 tablet (5 mg total) by mouth daily.    Dispense:  90 tablet    Refill:  1   losartan (COZAAR) 25 MG tablet    Sig: Take 0.5 tablets (12.5 mg total) by mouth daily.    Dispense:  45 tablet    Refill:  1    If you could, would you please cut these in half for her. She has trouble doing it herself.     electronically signed by:  Howard Pouch, DO  [place]

## 2021-01-19 NOTE — Patient Instructions (Addendum)
Great to see you today.  I have refilled the medication(s) we provide.   If labs were collected, we will inform you of lab results once received either by echart message or telephone call.   - echart message- for normal results that have been seen by the patient already.   - telephone call: abnormal results or if patient has not viewed results in their echart.  

## 2021-02-15 ENCOUNTER — Ambulatory Visit: Payer: Medicare Other | Admitting: Internal Medicine

## 2021-03-03 ENCOUNTER — Other Ambulatory Visit: Payer: Self-pay

## 2021-03-03 ENCOUNTER — Encounter: Payer: Self-pay | Admitting: Family Medicine

## 2021-03-03 ENCOUNTER — Ambulatory Visit (INDEPENDENT_AMBULATORY_CARE_PROVIDER_SITE_OTHER): Payer: Medicare Other | Admitting: Family Medicine

## 2021-03-03 VITALS — BP 146/83 | HR 64 | Temp 98.0°F | Ht 61.0 in | Wt 148.2 lb

## 2021-03-03 DIAGNOSIS — J069 Acute upper respiratory infection, unspecified: Secondary | ICD-10-CM | POA: Diagnosis not present

## 2021-03-03 DIAGNOSIS — J029 Acute pharyngitis, unspecified: Secondary | ICD-10-CM

## 2021-03-03 LAB — POCT INFLUENZA A/B
Influenza A, POC: NEGATIVE
Influenza B, POC: NEGATIVE

## 2021-03-03 LAB — POCT RAPID STREP A (OFFICE): Rapid Strep A Screen: NEGATIVE

## 2021-03-03 NOTE — Progress Notes (Addendum)
OFFICE VISIT  03/03/2021  CC:  Chief Complaint  Patient presents with   Sore Throat    Pt also c/o of nasal congestion, headache,fatigue. Symptoms started Tuesday. Completed at home covid test, negative results(11/16)   HPI:    Patient is a 87 y.o. female with a PMH significant for COPD who presents for respiratory symptoms and some left ear pain. Onset 2 d/a feeling a sore throat, experiencing a dry cough, nasal congestion, mild ear pain, runny nose, headache, and leg and back ache. She has used hot tea and aspirin with minimal symptom relief. She feels her symptoms are improving a little today.   ROS negative for fever, SOB, wheezing, or rash.    Home covid test neg yesterday.  Past Medical History:  Diagnosis Date   Arthritis    sed rate 10, RF, CCP pending   Blood in stool    Cervical cancer (HCC)    s/p hysterectomy/oop   DDD (degenerative disc disease), lumbosacral    Depression    Due to husbands passing away 09/22/2011   Diverticulosis of colon    Environmental allergies    GERD (gastroesophageal reflux disease)    H pylori ulcer    not treated due to expense  10/2001   Hand dermatitis    HTN (hypertension)    Hyperlipidemia    Hypothyroidism    TSH 14.488 (11/2005)   IBS (irritable bowel syndrome)    Lactose intolerance    Migraines    "get them very rarely now" (02/08/2012)   Multiple pigmented nevi    last derm evaluation 01/2003   OA (osteoarthritis)    multiple sites   Other seborrheic keratosis    Pain in joint, lower leg    Pneumonia    "couple times in my lifetime" (02/08/2012)   PONV (postoperative nausea and vomiting)    "and takes me a long time to come out under it" (02/08/2012)   Posterior vitreous detachment 1996   Postmenopausal    s/p hysterectomy for h/o cervical cancer 1982 (both ovaries taken at that time) now on hormonal replacement    Postmenopausal HRT (hormone replacement therapy)    PPD positive    history +PPD 1984, no treatment, no  abnormal CXR   Shortness of breath    with ambulation   Stroke [facility])    TIA- April 2016 last one   Supraventricular tachycardia, paroxysmal ([place])    Urinary frequency    Vaginal pruritus     Past Surgical History:  Procedure Laterality Date   ABDOMINAL HYSTERECTOMY  1982   CARDIOVASCULAR STRESS TEST  12/07/11   Normal nuclear stress test   CATARACT EXTRACTION W/ INTRAOCULAR LENS IMPLANT  ?1997   right   COLONOSCOPY     EYE SURGERY     cataract removal right eye   TOTAL ABDOMINAL HYSTERECTOMY W/ BILATERAL SALPINGOOPHORECTOMY  1982   TOTAL KNEE ARTHROPLASTY  02/08/2012   Procedure: TOTAL KNEE ARTHROPLASTY;  Surgeon: Hessie Dibble, MD;  Location: [place];  Service: Orthopedics;  Laterality: Left;    Outpatient Medications Prior to Visit  Medication Sig Dispense Refill   amLODipine (NORVASC) 5 MG tablet Take 1 tablet (5 mg total) by mouth daily. 90 tablet 1   aspirin EC 81 MG tablet Take 81 mg by mouth daily. Swallow whole.     cholecalciferol (VITAMIN D3) 25 MCG (1000 UNIT) tablet Take 1,000 Units by mouth daily.     Cyanocobalamin 1000 MCG LOZG Take by mouth.  diclofenac sodium (VOLTAREN) 1 % GEL Apply 2 g topically as needed. 100 g 11   fexofenadine (ALLEGRA ALLERGY) 180 MG tablet Take 1 tablet (180 mg total) by mouth daily. 90 tablet 3   levothyroxine (SYNTHROID) 75 MCG tablet Take 1 tablet (75 mcg total) by mouth daily before breakfast. 90 tablet 3   losartan (COZAAR) 25 MG tablet Take 0.5 tablets (12.5 mg total) by mouth daily. 45 tablet 1   omeprazole (PRILOSEC) 10 MG capsule Take 1 capsule (10 mg total) by mouth as needed. 90 capsule 3   simvastatin (ZOCOR) 10 MG tablet Take 1 tablet (10 mg total) by mouth every evening. 90 tablet 4   albuterol (PROAIR HFA) 108 (90 Base) MCG/ACT inhaler Inhale 2 puffs into the lungs every 4 (four) hours as needed for wheezing or shortness of breath. (Patient not taking: Reported on 03/03/2021) 1 Inhaler 3   No facility-administered  medications prior to visit.    Allergies  Allergen Reactions   Benzonatate Nausea And Vomiting    "tessalon pearl"   Sulfonamide Derivatives Itching and Rash   Fosamax [Alendronate]    Hctz [Hydrochlorothiazide] Other (See Comments)    Severe hyponatremia    Lisinopril Cough    ROS As per HPI  PE: Vitals with BMI 03/03/2021 01/19/2021 11/24/2020  Height 5\' 1"  5\' 1"  (No Data)  Weight 148 lbs 3 oz 150 lbs (No Data)  BMI 36.64 40.34 -  Systolic 742 595 (No Data)  Diastolic 83 64 (No Data)  Pulse 64 57 -   Physical Exam Constitutional:      Appearance: She is well-developed.  HENT:     Right Ear: Tympanic membrane and ear canal normal.     Left Ear: Tympanic membrane and ear canal normal.  Cardiovascular:     Rate and Rhythm: Normal rate and regular rhythm.     Heart sounds: Normal heart sounds.  Pulmonary:     Effort: Pulmonary effort is normal.     Breath sounds: Normal breath sounds.  Neurological:     Mental Status: She is alert.     LABS:    Chemistry      Component Value Date/Time   NA 136 09/07/2020 1429   K 4.3 09/07/2020 1429   CL 101 09/07/2020 1429   CO2 22 09/07/2020 1429   BUN 8 09/07/2020 1429   CREATININE 0.73 09/07/2020 1429      Component Value Date/Time   CALCIUM 9.1 09/07/2020 1429   ALKPHOS 77 09/16/2019 1125   AST 14 09/07/2020 1429   ALT 8 09/07/2020 1429   BILITOT 0.4 09/07/2020 1429     Lab Results  Component Value Date   WBC 5.8 09/07/2020   HGB 13.7 09/07/2020   HCT 39.7 09/07/2020   MCV 83.4 09/07/2020   PLT 238 09/07/2020     IMPRESSION AND PLAN: Amy Rodriguez is a non-toxic appearing female who presents today for URI symptoms. Given this patient has a cough, congestion, insidious onset and improvement in symptoms, and headache without fever and chills, upper respiratory infection with a virus is most likely. Of note, the patient does not have a productive cough or increased respiratory effort which indicates her COPD is not  exacerbated by her viral infection. Recommended the patient continue her hot tea to soothe her throat. Also recommended Mucinex DM to thin out some of her mucus.   Results: Swab for flu and strep negative  An After Visit Summary was printed and given to the patient.  FOLLOW UP: Return if symptoms worsen or fail to improve.  Phil Dopp - MS3  I personally was present during the history, physical exam, and medical decision-making activities of this service and have verified that the service and findings are accurately documented in the student's note.  Signed:  Crissie Sickles, MD           03/03/2021

## 2021-03-03 NOTE — Progress Notes (Signed)
See student note for this encounter. I attested it. Signed:  Crissie Sickles, MD           03/03/2021

## 2021-03-03 NOTE — Progress Notes (Deleted)
HPI: FU SVT. Nuclear study in August of 2013 showed an ejection fraction of 76% and normal perfusion. Holter 12/14 showed sinus with pacs, pvcs and rare couplet. Carotid dopplers 8/18 showed 1-39 bilateral stenosis.  Echocardiogram June 2021 showed normal LV function, mild left ventricular hypertrophy, mild RV dysfunction, trace aortic insufficiency and possible small PFO.  There was also question of apical hypertrophic cardiomyopathy and hypertrabeculation of the RV apex.  Since last seen,   Current Outpatient Medications  Medication Sig Dispense Refill   albuterol (PROAIR HFA) 108 (90 Base) MCG/ACT inhaler Inhale 2 puffs into the lungs every 4 (four) hours as needed for wheezing or shortness of breath. 1 Inhaler 3   amLODipine (NORVASC) 5 MG tablet Take 1 tablet (5 mg total) by mouth daily. 90 tablet 1   aspirin EC 81 MG tablet Take 81 mg by mouth daily. Swallow whole.     cholecalciferol (VITAMIN D3) 25 MCG (1000 UNIT) tablet Take 1,000 Units by mouth daily.     Cyanocobalamin 1000 MCG LOZG Take by mouth.     diclofenac sodium (VOLTAREN) 1 % GEL Apply 2 g topically as needed. 100 g 11   fexofenadine (ALLEGRA ALLERGY) 180 MG tablet Take 1 tablet (180 mg total) by mouth daily. 90 tablet 3   levothyroxine (SYNTHROID) 75 MCG tablet Take 1 tablet (75 mcg total) by mouth daily before breakfast. 90 tablet 3   losartan (COZAAR) 25 MG tablet Take 0.5 tablets (12.5 mg total) by mouth daily. 45 tablet 1   omeprazole (PRILOSEC) 10 MG capsule Take 1 capsule (10 mg total) by mouth as needed. 90 capsule 3   simvastatin (ZOCOR) 10 MG tablet Take 1 tablet (10 mg total) by mouth every evening. 90 tablet 4   No current facility-administered medications for this visit.     Past Medical History:  Diagnosis Date   Arthritis    sed rate 10, RF, CCP pending   Blood in stool    Cervical cancer (HCC)    s/p hysterectomy/oop   DDD (degenerative disc disease), lumbosacral    Depression    Due to  husbands passing away 09/22/2011   Diverticulosis of colon    Environmental allergies    GERD (gastroesophageal reflux disease)    H pylori ulcer    not treated due to expense  10/2001   Hand dermatitis    HTN (hypertension)    Hyperlipidemia    Hypothyroidism    TSH 14.488 (11/2005)   IBS (irritable bowel syndrome)    Lactose intolerance    Migraines    "get them very rarely now" (02/08/2012)   Multiple pigmented nevi    last derm evaluation 01/2003   OA (osteoarthritis)    multiple sites   Other seborrheic keratosis    Pain in joint, lower leg    Pneumonia    "couple times in my lifetime" (02/08/2012)   PONV (postoperative nausea and vomiting)    "and takes me a long time to come out under it" (02/08/2012)   Posterior vitreous detachment 1996   Postmenopausal    s/p hysterectomy for h/o cervical cancer 1982 (both ovaries taken at that time) now on hormonal replacement    Postmenopausal HRT (hormone replacement therapy)    PPD positive    history +PPD 1984, no treatment, no abnormal CXR   Shortness of breath    with ambulation   Stroke [facility])    TIA- April 2016 last one   Supraventricular tachycardia, paroxysmal (  [place])    Urinary frequency    Vaginal pruritus     Past Surgical History:  Procedure Laterality Date   ABDOMINAL HYSTERECTOMY  1982   CARDIOVASCULAR STRESS TEST  12/07/11   Normal nuclear stress test   CATARACT EXTRACTION W/ INTRAOCULAR LENS IMPLANT  ?1997   right   COLONOSCOPY     EYE SURGERY     cataract removal right eye   TOTAL ABDOMINAL HYSTERECTOMY W/ BILATERAL SALPINGOOPHORECTOMY  1982   TOTAL KNEE ARTHROPLASTY  02/08/2012   Procedure: TOTAL KNEE ARTHROPLASTY;  Surgeon: Hessie Dibble, MD;  Location: [place];  Service: Orthopedics;  Laterality: Left;    Social History   Socioeconomic History   Marital status: Widowed    Spouse name: Not on file   Number of children: 3   Years of education: 12TH   Highest education level: Not on file   Occupational History   Occupation: retired    Comment: Academic librarian: RETIRED  Tobacco Use   Smoking status: Former    Packs/day: 1.00    Years: 25.00    Pack years: 25.00    Types: Cigarettes    Quit date: 04/17/1980    Years since quitting: 40.9   Smokeless tobacco: Never   Tobacco comments:    passive smoker, husband smoked  Vaping Use   Vaping Use: Never used  Substance and Sexual Activity   Alcohol use: No   Drug use: No   Sexual activity: Never  Other Topics Concern   Not on file  Social History Narrative   Lives with son, [name] who has medical problems-they help each other. Her husband died Oct 05, 2022 after 57 years of marriage.  She has 2 other sons that live within 2 hrs from her. She does not drive-she uses [facility]. She used to work in Scient[employer], research (medical), retired.     Tobacco: 25 pack yr hx, quit 1985.   Social Determinants of Health   Financial Resource Strain: Low Risk    Difficulty of Paying Living Expenses: Not hard at all  Food Insecurity: No Food Insecurity   Worried About Charity fundraiser in the Last Year: Never true   Arboriculturist in the Last Year: Never true  Transportation Needs: Not on file  Physical Activity: Not on file  Stress: No Stress Concern Present   Feeling of Stress : Not at all  Social Connections: Moderately Isolated   Frequency of Communication with Friends and Family: More than three times a week   Frequency of Social Gatherings with Friends and Family: Three times a week   Attends Religious Services: More than 4 times per year   Active Member of Clubs or Organizations: No   Attends Archivist Meetings: Never   Marital Status: Widowed  Human resources officer Violence: Not on file    Family History  Problem Relation Age of Onset   Breast cancer Mother 38   Lung cancer Brother 36   Cancer Brother        lung   Heart failure Father        congestive   Heart disease Father    Epilepsy Son    Kidney disease Son         tuberous sclerosis, both kidneys removed, has transplant   Diabetes Maternal Uncle    Diabetes Paternal Aunt    Cancer Maternal Grandmother        breast   Breast cancer Maternal Grandmother  Heart disease Son        [place] white   COPD Son    Rectal cancer Maternal Aunt    Colon cancer Neg Hx    Esophageal cancer Neg Hx    Stomach cancer Neg Hx     ROS: no fevers or chills, productive cough, hemoptysis, dysphasia, odynophagia, melena, hematochezia, dysuria, hematuria, rash, seizure activity, orthopnea, PND, pedal edema, claudication. Remaining systems are negative.  Physical Exam: Well-developed well-nourished in no acute distress.  Skin is warm and dry.  HEENT is normal.  Neck is supple.  Chest is clear to auscultation with normal expansion.  Cardiovascular exam is regular rate and rhythm.  Abdominal exam nontender or distended. No masses palpated. Extremities show no edema. neuro grossly intact  ECG- personally reviewed  A/P  1 history of SVT-no recurrences by report.  Note beta-blocker was discontinued previously due to bradycardia.  2 hypertension-patient's blood pressure is controlled.  Continue present medications and follow.  3 Hyperlipidemia-continue statin.  4 carotid artery disease-mild on most recent Dopplers.  5 lower extremity edema-unchanged.  We will continue with feet elevation and compression.  Kirk Ruths, MD

## 2021-03-14 ENCOUNTER — Ambulatory Visit: Payer: Medicare Other | Admitting: Cardiology

## 2021-03-15 ENCOUNTER — Telehealth: Payer: Self-pay

## 2021-03-15 NOTE — Telephone Encounter (Signed)
Spoke with pt who stated that she experiencing worsening in cough and new onset of SOB. Pt was advise to go to an local UC/ER but pt declined. Pt states that she is still taking the MucinexDm as recommended and have her inhalers on hand. Please advise with in add'l instructions.

## 2021-03-15 NOTE — Telephone Encounter (Signed)
If symptoms are not improving she should be seen.  FYI she was seen by Dr. Anitra Lauth.

## 2021-03-15 NOTE — Telephone Encounter (Signed)
Patient was seen on 11/17 by Dr. Anitra Lauth.  She is not any better, she has not gotten better.  Patient tested negative for COVID.  She stated she was told it was a virus.  Please advise.  No appts available here or within [facility].   Please call patient at home # [phone].

## 2021-03-15 NOTE — Telephone Encounter (Signed)
Pt sched for next avail

## 2021-03-17 ENCOUNTER — Ambulatory Visit: Payer: Medicare Other | Admitting: Cardiology

## 2021-03-21 ENCOUNTER — Ambulatory Visit: Payer: Medicare Other | Admitting: Internal Medicine

## 2021-03-29 ENCOUNTER — Encounter: Payer: Self-pay | Admitting: Family Medicine

## 2021-03-29 ENCOUNTER — Other Ambulatory Visit: Payer: Self-pay

## 2021-03-29 ENCOUNTER — Ambulatory Visit (INDEPENDENT_AMBULATORY_CARE_PROVIDER_SITE_OTHER): Payer: Medicare Other | Admitting: Family Medicine

## 2021-03-29 VITALS — BP 148/68 | HR 59 | Temp 97.5°F | Ht 61.0 in | Wt 150.0 lb

## 2021-03-29 DIAGNOSIS — B9689 Other specified bacterial agents as the cause of diseases classified elsewhere: Secondary | ICD-10-CM | POA: Diagnosis not present

## 2021-03-29 DIAGNOSIS — J441 Chronic obstructive pulmonary disease with (acute) exacerbation: Secondary | ICD-10-CM

## 2021-03-29 DIAGNOSIS — R062 Wheezing: Secondary | ICD-10-CM | POA: Diagnosis not present

## 2021-03-29 MED ORDER — PREDNISONE 20 MG PO TABS: 40.0000 mg | ORAL_TABLET | Freq: Every day | ORAL | 0 refills | Status: DC

## 2021-03-29 MED ORDER — DOXYCYCLINE HYCLATE 100 MG PO TABS: 100.0000 mg | ORAL_TABLET | Freq: Two times a day (BID) | ORAL | 0 refills | Status: DC

## 2021-03-29 MED ORDER — BUDESONIDE-FORMOTEROL FUMARATE 80-4.5 MCG/ACT IN AERO: 2.0000 | INHALATION_SPRAY | Freq: Two times a day (BID) | RESPIRATORY_TRACT | 0 refills | Status: DC

## 2021-03-29 NOTE — Patient Instructions (Addendum)
° ° °  Start doxycyline every 12 hours for 10 days. Start prednisone 2 tabs with food for 5 days Start Symbicort inhaler 2 puffs every 12 hours for 4 weeks. Use albuterol 2 puffs as needed for wheezing.    Follow up in 2-3 weeks if symptoms are not resolved, sooner if not improving.

## 2021-03-29 NOTE — Progress Notes (Addendum)
This visit occurred during the SARS-CoV-2 public health emergency.  Safety protocols were in place, including screening questions prior to the visit, additional usage of staff PPE, and extensive cleaning of exam room while observing appropriate contact time as indicated for disinfecting solutions.    Amy Rodriguez , 03/22/1934, 87 y.o., female MRN: [id number] Patient Care Team    Relationship Specialty Notifications Start End  Ma Hillock, DO PCP - General Family Medicine  01/08/15   Shon Hough, MD Consulting Physician Ophthalmology  08/03/15   Collene Gobble, MD Consulting Physician Pulmonary Disease  08/03/15   Lelon Perla, MD Consulting Physician Cardiology  08/03/15   Garvin Fila, MD Consulting Physician Neurology  08/03/15   Inda Castle, MD (Inactive) Consulting Physician Gastroenterology  08/04/15   Philemon Kingdom, MD Consulting Physician Internal Medicine  08/07/16   Melrose Nakayama, MD Consulting Physician Orthopedic Surgery  08/07/16     Chief Complaint  Patient presents with   Cough    Productive; phlegm is light yellow. Pt also c/o nasal and chest congestion. Saw Dr.McGowen on 11/17, negative results for strep, flu, and covid. Used Claritin, coricidin, robitussin, ibuprofen, breathing strips for relief     Subjective: Pt presents for an OV with complaints of cystic cough greater than 4 weeks, phlegm production, nasal and chest congestion, sore throat and fatigue.  She reports headaches and sinus pressure.  She states she is really been miserable last couple weeks.  She does not have much of an appetite, but she is tolerating p.o.  She has had to use her albuterol inhaler at least twice daily.  She was seen 3 weeks ago by another provider with negative flu, COVID and strep tests. Patient has a pertinent past medical history of COPD. Depression screen [facility] 2/9 01/19/2021 03/23/2020 09/27/2018 08/14/2017 08/07/2016  Decreased Interest 0 0 0 0 0  Down,  Depressed, Hopeless 0 - 0 0 0  PHQ - 2 Score 0 0 0 0 0  Some recent data might be hidden    Allergies  Allergen Reactions   Benzonatate Nausea And Vomiting    "tessalon pearl"   Sulfonamide Derivatives Itching and Rash   Fosamax [Alendronate]    Hctz [Hydrochlorothiazide] Other (See Comments)    Severe hyponatremia    Lisinopril Cough   Social History   Social History Narrative   Lives with son, Amy Rodriguez who has medical problems-they help each other. Her husband died 10/02/22 after 40 years of marriage.  She has 2 other sons that live within 2 hrs from her. She does not drive-she uses [facility]. She used to work in Scientist, research (medical), retired.     Tobacco: 25 pack yr hx, quit 1985.   Past Medical History:  Diagnosis Date   Arthritis    sed rate 10, RF, CCP pending   Blood in stool    Cervical cancer (HCC)    s/p hysterectomy/oop   DDD (degenerative disc disease), lumbosacral    Depression    Due to husbands passing away 09/22/2011   Diverticulosis of colon    Environmental allergies    GERD (gastroesophageal reflux disease)    H pylori ulcer    not treated due to expense  10/2001   Hand dermatitis    HTN (hypertension)    Hyperlipidemia    Hypothyroidism    TSH 14.488 (11/2005)   IBS (irritable bowel syndrome)    Lactose intolerance    Migraines    "get  them very rarely now" (02/08/2012)   Multiple pigmented nevi    last derm evaluation 01/2003   OA (osteoarthritis)    multiple sites   Other seborrheic keratosis    Pain in joint, lower leg    Pneumonia    "couple times in my lifetime" (02/08/2012)   PONV (postoperative nausea and vomiting)    "and takes me a long time to come out under it" (02/08/2012)   Posterior vitreous detachment 1996   Postmenopausal    s/p hysterectomy for h/o cervical cancer 1982 (both ovaries taken at that time) now on hormonal replacement    Postmenopausal HRT (hormone replacement therapy)    PPD positive    history +PPD 1984, no treatment, no  abnormal CXR   Shortness of breath    with ambulation   Stroke [facility])    TIA- April 2016 last one   Supraventricular tachycardia, paroxysmal ([place])    Urinary frequency    Vaginal pruritus    Past Surgical History:  Procedure Laterality Date   ABDOMINAL HYSTERECTOMY  1982   CARDIOVASCULAR STRESS TEST  12/07/11   Normal nuclear stress test   CATARACT EXTRACTION W/ INTRAOCULAR LENS IMPLANT  ?1997   right   COLONOSCOPY     EYE SURGERY     cataract removal right eye   TOTAL ABDOMINAL HYSTERECTOMY W/ BILATERAL SALPINGOOPHORECTOMY  1982   TOTAL KNEE ARTHROPLASTY  02/08/2012   Procedure: TOTAL KNEE ARTHROPLASTY;  Surgeon: Hessie Dibble, MD;  Location: [place];  Service: Orthopedics;  Laterality: Left;   Family History  Problem Relation Age of Onset   Breast cancer Mother 76   Lung cancer Brother 68   Cancer Brother        lung   Heart failure Father        congestive   Heart disease Father    Epilepsy Son    Kidney disease Son        tuberous sclerosis, both kidneys removed, has transplant   Diabetes Maternal Uncle    Diabetes Paternal Aunt    Cancer Maternal Grandmother        breast   Breast cancer Maternal Grandmother    Heart disease Son        [place] white   COPD Son    Rectal cancer Maternal Aunt    Colon cancer Neg Hx    Esophageal cancer Neg Hx    Stomach cancer Neg Hx    Allergies as of 03/29/2021       Reactions   Benzonatate Nausea And Vomiting   "tessalon pearl"   Sulfonamide Derivatives Itching, Rash   Fosamax [alendronate]    Hctz [hydrochlorothiazide] Other (See Comments)   Severe hyponatremia    Lisinopril Cough        Medication List        Accurate as of March 29, 2021  4:23 PM. If you have any questions, ask your nurse or doctor.          albuterol 108 (90 Base) MCG/ACT inhaler Commonly known as: ProAir HFA Inhale 2 puffs into the lungs every 4 (four) hours as needed for wheezing or shortness of breath.   amLODipine 5 MG  tablet Commonly known as: NORVASC Take 1 tablet (5 mg total) by mouth daily.   aspirin EC 81 MG tablet Take 81 mg by mouth daily. Swallow whole.   budesonide-formoterol 80-4.5 MCG/ACT inhaler Commonly known as: SYMBICORT Inhale 2 puffs into the lungs 2 (two) times daily. Started by:  Ladarryl Wrage, DO   cholecalciferol 25 MCG (1000 UNIT) tablet Commonly known as: VITAMIN D3 Take 1,000 Units by mouth daily.   Cyanocobalamin 1000 MCG Lozg Take by mouth.   diclofenac sodium 1 % Gel Commonly known as: Voltaren Apply 2 g topically as needed.   doxycycline 100 MG tablet Commonly known as: VIBRA-TABS Take 1 tablet (100 mg total) by mouth 2 (two) times daily. Started by: Howard Pouch, DO   fexofenadine 180 MG tablet Commonly known as: Allegra Allergy Take 1 tablet (180 mg total) by mouth daily.   levothyroxine 75 MCG tablet Commonly known as: SYNTHROID Take 1 tablet (75 mcg total) by mouth daily before breakfast.   losartan 25 MG tablet Commonly known as: COZAAR Take 0.5 tablets (12.5 mg total) by mouth daily.   omeprazole 10 MG capsule Commonly known as: PRILOSEC Take 1 capsule (10 mg total) by mouth as needed.   predniSONE 20 MG tablet Commonly known as: DELTASONE Take 2 tablets (40 mg total) by mouth daily with breakfast. Started by: Howard Pouch, DO   simvastatin 10 MG tablet Commonly known as: ZOCOR Take 1 tablet (10 mg total) by mouth every evening.        All past medical history, surgical history, allergies, family history, immunizations andmedications were updated in the EMR today and reviewed under the history and medication portions of their EMR.     ROS Negative, with the exception of above mentioned in HPI   Objective:  BP (!) 148/68    Pulse (!) 59    Temp (!) 97.5 F (36.4 C) (Oral)    Ht 5\' 1"  (1.549 m)    Wt 150 lb (68 kg)    SpO2 96%    BMI 28.34 kg/m  Body mass index is 28.34 kg/m.  Physical Exam Gen: Afebrile. No acute distress.  Nontoxic in appearance, well developed, well nourished.  HENT: AT. [place]. Bilateral TM visualized without erythema or buldging. MMM, no oral lesions. Bilateral nares with erythema and drainage. Throat without erythema or exudates. Cough and hoarseness present.  Eyes:Pupils Equal Round Reactive to light, Extraocular movements intact,  Conjunctiva without redness, discharge or icterus. Neck/lymp/endocrine: Supple,no lymphadenopathy CV: RRR  Chest: diffuse wheezing, Mild increase in WOB Skin: no rashes, purpura or petechiae.  Neuro: Normal gait. PERLA. EOMi. Alert. Oriented x3  Psych: Normal affect, dress and demeanor. Normal speech. Normal thought content and judgment.  No results found. No results found. No results found for this or any previous visit (from the past 24 hour(s)).  Assessment/Plan: MORISSA OBEIRNE is a 87 y.o. female present for OV for  Wheezing/COPD exacerbation (HCC)/Bacterial respiratory infection Rest, hydrate.  DuoNeb treatment provided in office today helped clear wheezing and improved work of breath. Start mucinex (DM if cough), nettie pot or nasal saline.  Start doxycycline twice daily x10 days Start Symbicort 2 puffs twice daily x4 weeks Start prednisone 40 mg daily x5 days F/U 2 weeks if not improved.  Sooner if worsening  Reviewed expectations re: course of current medical issues. Discussed self-management of symptoms. Outlined signs and symptoms indicating need for more acute intervention. Patient verbalized understanding and all questions were answered. Patient received an After-Visit Summary.    No orders of the defined types were placed in this encounter.  Meds ordered this encounter  Medications   DISCONTD: doxycycline (VIBRA-TABS) 100 MG tablet    Sig: Take 1 tablet (100 mg total) by mouth 2 (two) times daily.    Dispense:  20 tablet  Refill:  0    Not allergic- has had many times.   DISCONTD: budesonide-formoterol (SYMBICORT) 80-4.5 MCG/ACT  inhaler    Sig: Inhale 2 puffs into the lungs 2 (two) times daily.    Dispense:  1 each    Refill:  0   DISCONTD: predniSONE (DELTASONE) 20 MG tablet    Sig: Take 2 tablets (40 mg total) by mouth daily with breakfast.    Dispense:  10 tablet    Refill:  0    Not allergic.   budesonide-formoterol (SYMBICORT) 80-4.5 MCG/ACT inhaler    Sig: Inhale 2 puffs into the lungs 2 (two) times daily.    Dispense:  1 each    Refill:  0   doxycycline (VIBRA-TABS) 100 MG tablet    Sig: Take 1 tablet (100 mg total) by mouth 2 (two) times daily.    Dispense:  20 tablet    Refill:  0    Not allergic- has had many times.   predniSONE (DELTASONE) 20 MG tablet    Sig: Take 2 tablets (40 mg total) by mouth daily with breakfast.    Dispense:  10 tablet    Refill:  0    Not allergic.   Referral Orders  No referral(s) requested today     Note is dictated utilizing voice recognition software. Although note has been proof read prior to signing, occasional typographical errors still can be missed. If any questions arise, please do not hesitate to call for verification.   electronically signed by:  Howard Pouch, DO  [place]

## 2021-04-05 ENCOUNTER — Encounter: Payer: Self-pay | Admitting: Internal Medicine

## 2021-04-05 ENCOUNTER — Ambulatory Visit (INDEPENDENT_AMBULATORY_CARE_PROVIDER_SITE_OTHER): Payer: Medicare Other | Admitting: Internal Medicine

## 2021-04-05 ENCOUNTER — Other Ambulatory Visit: Payer: Self-pay

## 2021-04-05 VITALS — BP 130/70 | HR 62 | Ht 61.0 in | Wt 150.2 lb

## 2021-04-05 DIAGNOSIS — E049 Nontoxic goiter, unspecified: Secondary | ICD-10-CM

## 2021-04-05 DIAGNOSIS — E038 Other specified hypothyroidism: Secondary | ICD-10-CM | POA: Diagnosis not present

## 2021-04-05 DIAGNOSIS — E063 Autoimmune thyroiditis: Secondary | ICD-10-CM

## 2021-04-05 NOTE — Patient Instructions (Addendum)
Please continue Levothyroxine 75 mcg.  Take the thyroid hormone every day, with water, at least 30 minutes before breakfast, separated by at least 4 hours from: - acid reflux medications - calcium - iron - multivitamins  Please have another TSH checked by Dr. Raoul Pitch in May.  Please return to see me as needed.

## 2021-04-05 NOTE — Progress Notes (Signed)
[name], female   DOB: 1933-06-03, 87 y.o.   MRN: [id number]  This visit occurred during the SARS-CoV-2 public health emergency.  Safety protocols were in place, including screening questions prior to the visit, additional usage of staff PPE, and extensive cleaning of exam room while observing appropriate contact time as indicated for disinfecting solutions.   HPI  Amy Rodriguez is a 87 y.o.-year-old female, returning for f/u for hypothyroidism 2/2 to Hashimoto's thyroiditis and R thyroid enlargement. Last visit 1 year and 2 months ago.  Interim hx: She has a URI >> started Prednisone 03/29/2021 - has 2 days left. She otherwise feels well, without fatigue or other complaints.  Pt. has been dx with hypothyroidism in 2011, and has Hashimoto's thyroiditis in 2016.  Pt is on levothyroxine 75 mcg daily, taken: - in am - fasting - at least 30 min from b'fast - + calcium (Tums) later in the day - occasional - no iron - no multivitamins - + PPIs (Prilosec) later in the day - occasionally - not on Biotin  Reviewed her TFTs: Lab Results  Component Value Date   TSH 3.05 09/07/2020   TSH 2.65 02/10/2020   TSH 2.97 09/16/2019   TSH 1.93 09/27/2018   TSH 3.65 09/11/2017   TSH 2.96 11/03/2016   TSH 2.13 11/04/2015   TSH 2.57 08/03/2015   TSH 2.47 10/20/2014   TSH 1.63 07/08/2014   FREET4 0.99 02/10/2020   FREET4 0.86 11/03/2016   FREET4 0.95 11/04/2015   FREET4 1.03 07/08/2014   FREET4 1.39 03/18/2012    Her TPO antibodies were elevated, indicating Hashimoto's thyroiditis: Component     Latest Ref Rng & Units 07/08/2014 10/20/2014  Thyroperoxidase Ab SerPl-aCnc     <9 IU/mL >900 (H) >900 (H)   Enlarged right thyroid lobe   Thyroid ultrasound (07/09/2014): Enlarged right lobe, with hypoechoic aspect and moth-eaten appearance suggestive of Hashimoto's thyroiditis.  Normal left lobe.  No nodules  Pt denies: - feeling nodules in neck - hoarseness - dysphagia -  choking - SOB with lying down  She also has HL, HTN, GERD, vit D def. - on vit D, bronchiectasis, TIA - small aneurysm - being followed by Dr. Leonie Man.  She had L TKR 2013. She has a history of B12 deficiency >> prev. on B12 inj's >> now on drops 5000 mcg daily.  ROS: + see HPI  I reviewed pt's medications, allergies, PMH, social hx, family hx, and changes were documented in the history of present illness. Otherwise, unchanged from my initial visit note.   Past Medical History:  Diagnosis Date   Arthritis    sed rate 10, RF, CCP pending   Blood in stool    Cervical cancer (HCC)    s/p hysterectomy/oop   DDD (degenerative disc disease), lumbosacral    Depression    Due to husbands passing away 09/22/2011   Diverticulosis of colon    Environmental allergies    GERD (gastroesophageal reflux disease)    H pylori ulcer    not treated due to expense  10/2001   Hand dermatitis    HTN (hypertension)    Hyperlipidemia    Hypothyroidism    TSH 14.488 (11/2005)   IBS (irritable bowel syndrome)    Lactose intolerance    Migraines    "get them very rarely now" (02/08/2012)   Multiple pigmented nevi    last derm evaluation 01/2003   OA (osteoarthritis)    multiple sites   Other  seborrheic keratosis    Pain in joint, lower leg    Pneumonia    "couple times in my lifetime" (02/08/2012)   PONV (postoperative nausea and vomiting)    "and takes me a long time to come out under it" (02/08/2012)   Posterior vitreous detachment 1996   Postmenopausal    s/p hysterectomy for h/o cervical cancer 1982 (both ovaries taken at that time) now on hormonal replacement    Postmenopausal HRT (hormone replacement therapy)    PPD positive    history +PPD 1984, no treatment, no abnormal CXR   Shortness of breath    with ambulation   Stroke [facility])    TIA- April 2016 last one   Supraventricular tachycardia, paroxysmal ([place])    Urinary frequency    Vaginal pruritus    Past Surgical History:  Procedure  Laterality Date   ABDOMINAL HYSTERECTOMY  1982   CARDIOVASCULAR STRESS TEST  12/07/11   Normal nuclear stress test   CATARACT EXTRACTION W/ INTRAOCULAR LENS IMPLANT  ?1997   right   COLONOSCOPY     EYE SURGERY     cataract removal right eye   TOTAL ABDOMINAL HYSTERECTOMY W/ BILATERAL SALPINGOOPHORECTOMY  1982   TOTAL KNEE ARTHROPLASTY  02/08/2012   Procedure: TOTAL KNEE ARTHROPLASTY;  Surgeon: Hessie Dibble, MD;  Location: [facility];  Service: Orthopedics;  Laterality: Left;   Social History   Socioeconomic History   Marital status: Widowed    Spouse name: Not on file   Number of children: 3   Years of education: 12TH   Highest education level: Not on file  Occupational History   Occupation: retired    Comment: Academic librarian: RETIRED  Tobacco Use   Smoking status: Former    Packs/day: 1.00    Years: 25.00    Pack years: 25.00    Types: Cigarettes    Quit date: 04/17/1980    Years since quitting: 40.9   Smokeless tobacco: Never   Tobacco comments:    passive smoker, husband smoked  Vaping Use   Vaping Use: Never used  Substance and Sexual Activity   Alcohol use: No   Drug use: No   Sexual activity: Never  Other Topics Concern   Not on file  Social History Narrative   Lives with son, [name] who has medical problems-they help each other. Her husband died 2022-10-05 after 53 years of marriage.  She has 2 other sons that live within 2 hrs from her. She does not drive-she uses [facility]. She used to work in Scientist, research (medical), retired.     Tobacco: 25 pack yr hx, quit 1985.   Social Determinants of Health   Financial Resource Strain: Low Risk    Difficulty of Paying Living Expenses: Not hard at all  Food Insecurity: No Food Insecurity   Worried About Charity fundraiser in the Last Year: Never true   Arboriculturist in the Last Year: Never true  Transportation Needs: Not on file  Physical Activity: Not on file  Stress: No Stress Concern Present   Feeling of Stress : Not  at all  Social Connections: Moderately Isolated   Frequency of Communication with Friends and Family: More than three times a week   Frequency of Social Gatherings with Friends and Family: Three times a week   Attends Religious Services: More than 4 times per year   Active Member of Clubs or Organizations: No   Attends Archivist Meetings:  Never   Marital Status: Widowed  Intimate Partner Violence: Not on file   Current Outpatient Medications on File Prior to Visit  Medication Sig Dispense Refill   albuterol (PROAIR HFA) 108 (90 Base) MCG/ACT inhaler Inhale 2 puffs into the lungs every 4 (four) hours as needed for wheezing or shortness of breath. 1 Inhaler 3   amLODipine (NORVASC) 5 MG tablet Take 1 tablet (5 mg total) by mouth daily. 90 tablet 1   aspirin EC 81 MG tablet Take 81 mg by mouth daily. Swallow whole.     budesonide-formoterol (SYMBICORT) 80-4.5 MCG/ACT inhaler Inhale 2 puffs into the lungs 2 (two) times daily. 1 each 0   cholecalciferol (VITAMIN D3) 25 MCG (1000 UNIT) tablet Take 1,000 Units by mouth daily.     Cyanocobalamin 1000 MCG LOZG Take by mouth.     diclofenac sodium (VOLTAREN) 1 % GEL Apply 2 g topically as needed. 100 g 11   doxycycline (VIBRA-TABS) 100 MG tablet Take 1 tablet (100 mg total) by mouth 2 (two) times daily. 20 tablet 0   fexofenadine (ALLEGRA ALLERGY) 180 MG tablet Take 1 tablet (180 mg total) by mouth daily. 90 tablet 3   levothyroxine (SYNTHROID) 75 MCG tablet Take 1 tablet (75 mcg total) by mouth daily before breakfast. 90 tablet 3   losartan (COZAAR) 25 MG tablet Take 0.5 tablets (12.5 mg total) by mouth daily. 45 tablet 1   omeprazole (PRILOSEC) 10 MG capsule Take 1 capsule (10 mg total) by mouth as needed. 90 capsule 3   predniSONE (DELTASONE) 20 MG tablet Take 2 tablets (40 mg total) by mouth daily with breakfast. 10 tablet 0   simvastatin (ZOCOR) 10 MG tablet Take 1 tablet (10 mg total) by mouth every evening. 90 tablet 4   No current  facility-administered medications on file prior to visit.   Allergies  Allergen Reactions   Benzonatate Nausea And Vomiting    "tessalon pearl"   Sulfonamide Derivatives Itching and Rash   Fosamax [Alendronate]    Hctz [Hydrochlorothiazide] Other (See Comments)    Severe hyponatremia    Lisinopril Cough   Family History  Problem Relation Age of Onset   Breast cancer Mother 38   Lung cancer Brother 70   Cancer Brother        lung   Heart failure Father        congestive   Heart disease Father    Epilepsy Son    Kidney disease Son        tuberous sclerosis, both kidneys removed, has transplant   Diabetes Maternal Uncle    Diabetes Paternal Aunt    Cancer Maternal Grandmother        breast   Breast cancer Maternal Grandmother    Heart disease Son        [place] white   COPD Son    Rectal cancer Maternal Aunt    Colon cancer Neg Hx    Esophageal cancer Neg Hx    Stomach cancer Neg Hx    PE: BP 130/70 (BP Location: Right Arm, Patient Position: Sitting, Cuff Size: Normal)    Pulse 62    Ht 5\' 1"  (1.549 m)    Wt 150 lb 3.2 oz (68.1 kg)    SpO2 94%    BMI 28.38 kg/m   Wt Readings from Last 3 Encounters:  04/05/21 150 lb 3.2 oz (68.1 kg)  03/29/21 150 lb (68 kg)  03/03/21 148 lb 3.2 oz (67.2 kg)   Constitutional:  normal exam weight, in NAD Eyes: PERRLA, EOMI, no exophthalmos ENT: moist mucous membranes, + slight R thyromegaly, no cervical lymphadenopathy Cardiovascular: RRR, No MRG, no LE edema at today's visit Respiratory: CTA B Musculoskeletal: no deformities, strength intact in all 4 Skin: moist, warm, no rashes Neurological: no tremor with outstretched hands, DTR normal in all 4  ASSESSMENT: 1. Hypothyroidism - 2/2 Hashimoto's thyroiditis  2.  Right goiter - Thyroid U/S (07/09/2014):  - Right thyroid lobe: 7.6 x 3.3 x 3.6 cm. The right thyroid lobe is diffusely enlarged and hypoechoic. Right thyroid tissue is mildly lobular but there is not a focal  nodule. (honeycomb appearance) - Left thyroid lobe: 5.1 x 2.2 x 2.2 cm. Left thyroid tissue is heterogeneous but more hyperechoic than the right thyroid lobe and isthmus. There is not a focal nodule. Left thyroid tissue is very vascular. - Isthmus Thickness: 0.8 cm. Isthmus is prominent for size and has a similar hypoechoic appearance as the right thyroid lobe. - Lymphadenopathy None visualized.    PLAN:  1. Patient with history of Hashimoto's hypothyroidism, well-controlled - latest thyroid labs reviewed with pt. >> normal: Lab Results  Component Value Date   TSH 3.05 09/07/2020  - she continues on LT4 75 mcg daily - pt feels good on this dose. - we discussed about taking the thyroid hormone every day, with water, >30 minutes before breakfast, separated by >4 hours from acid reflux medications, calcium, iron, multivitamins. Pt. is taking it correctly. - At today's visit, we could not check TFTs since she is on prednisone - She has another appointment coming up with Dr. Raoul Pitch in 08/2021.  I do not feel that it would be unreasonable to wait until then to recheck her thyroid tests, since this has been stable now for several years. - In fact, we discussed that she can continue to follow-up with her PCP for this condition, since it appears to be very stable.  She agrees with this plan. - I advised her that she can always return to see me if the tests become abnormal in the future  2. Right thyroid lobe enlargement -reviewed the report and the images of her thyroid ultrasound from 2016: Right thyroid lobe was large, mobile, without nodules and with heterogeneous aspect, consistent with Hashimoto's thyroiditis.  It also contains septations and was hypoechoic. -No neck compression symptoms -No need to repeat her thyroid ultrasound needed  Philemon Kingdom, MD PhD [facility]

## 2021-04-29 ENCOUNTER — Other Ambulatory Visit: Payer: Self-pay

## 2021-04-29 ENCOUNTER — Encounter: Payer: Self-pay | Admitting: Family Medicine

## 2021-04-29 ENCOUNTER — Ambulatory Visit (INDEPENDENT_AMBULATORY_CARE_PROVIDER_SITE_OTHER): Payer: Medicare Other | Admitting: Family Medicine

## 2021-04-29 VITALS — BP 139/65 | HR 61 | Temp 97.7°F | Ht 61.0 in | Wt 147.0 lb

## 2021-04-29 DIAGNOSIS — J440 Chronic obstructive pulmonary disease with acute lower respiratory infection: Secondary | ICD-10-CM | POA: Diagnosis not present

## 2021-04-29 DIAGNOSIS — G9339 Other post infection and related fatigue syndromes: Secondary | ICD-10-CM

## 2021-04-29 MED ORDER — BUDESONIDE-FORMOTEROL FUMARATE 80-4.5 MCG/ACT IN AERO: 2.0000 | INHALATION_SPRAY | Freq: Two times a day (BID) | RESPIRATORY_TRACT | 5 refills | Status: DC

## 2021-04-29 NOTE — Progress Notes (Signed)
This visit occurred during the SARS-CoV-2 public health emergency.  Safety protocols were in place, including screening questions prior to the visit, additional usage of staff PPE, and extensive cleaning of exam room while observing appropriate contact time as indicated for disinfecting solutions.    [place] , 1934-02-06, 88 y.o., female MRN: [id number] Patient Care Team    Relationship Specialty Notifications Start End  Ma Hillock, DO PCP - General Family Medicine  01/08/15   Shon Hough, MD Consulting Physician Ophthalmology  08/03/15   Collene Gobble, MD Consulting Physician Pulmonary Disease  08/03/15   Lelon Perla, MD Consulting Physician Cardiology  08/03/15   Garvin Fila, MD Consulting Physician Neurology  08/03/15   Inda Castle, MD (Inactive) Consulting Physician Gastroenterology  08/04/15   Philemon Kingdom, MD Consulting Physician Internal Medicine  08/07/16   Melrose Nakayama, MD Consulting Physician Orthopedic Surgery  08/07/16     Chief Complaint  Patient presents with   Cough    F/u; improving      Subjective: Pt presents for an OV to follow up on illness> she has completed the doxycycline, prednisone taper.  She states she is seeing an improvement although she still gets winded at times.  She has been using the Symbicort twice daily, 2 puffs.  She reports over last couple days she has not been as good about remembering to take it twice a day.  She denies any fevers Prior note: with complaints of cough greater than 4 weeks, phlegm production, nasal and chest congestion, sore throat and fatigue.  She reports headaches and sinus pressure.  She states she is really been miserable last couple weeks.  She does not have much of an appetite, but she is tolerating p.o.  She has had to use her albuterol inhaler at least twice daily.  She was seen 3 weeks ago by another provider with negative flu, COVID and strep tests. Patient has a pertinent past  medical history of COPD. Depression screen [facility] 2/9 01/19/2021 03/23/2020 09/27/2018 08/14/2017 08/07/2016  Decreased Interest 0 0 0 0 0  Down, Depressed, Hopeless 0 - 0 0 0  PHQ - 2 Score 0 0 0 0 0  Some recent data might be hidden    Allergies  Allergen Reactions   Benzonatate Nausea And Vomiting    "tessalon pearl"   Sulfonamide Derivatives Itching and Rash   Fosamax [Alendronate]    Hctz [Hydrochlorothiazide] Other (See Comments)    Severe hyponatremia    Lisinopril Cough   Social History   Social History Narrative   Lives with son, [name] who has medical problems-they help each other. Her husband died 22-Oct-2022 after 108 years of marriage.  She has 2 other sons that live within 2 hrs from her. She does not drive-she uses [facility]. She used to work in [employer] (medical), retired.     Tobacco: 25 pack yr hx, quit 1985.   Past Medical History:  Diagnosis Date   Arthritis    sed rate 10, RF, CCP pending   Blood in stool    Cervical cancer (HCC)    s/p hysterectomy/oop   DDD (degenerative disc disease), lumbosacral    Depression    Due to husbands passing away 09/22/2011   Diverticulosis of colon    Environmental allergies    GERD (gastroesophageal reflux disease)    H pylori ulcer    not treated due to expense  10/2001   Hand dermatitis  HTN (hypertension)    Hyperlipidemia    Hypothyroidism    TSH 14.488 (11/2005)   IBS (irritable bowel syndrome)    Lactose intolerance    Migraines    "get them very rarely now" (02/08/2012)   Multiple pigmented nevi    last derm evaluation 01/2003   OA (osteoarthritis)    multiple sites   Other seborrheic keratosis    Pain in joint, lower leg    Pneumonia    "couple times in my lifetime" (02/08/2012)   PONV (postoperative nausea and vomiting)    "and takes me a long time to come out under it" (02/08/2012)   Posterior vitreous detachment 1996   Postmenopausal    s/p hysterectomy for h/o cervical cancer 1982 (both ovaries taken at that  time) now on hormonal replacement    Postmenopausal HRT (hormone replacement therapy)    PPD positive    history +PPD 1984, no treatment, no abnormal CXR   Shortness of breath    with ambulation   Stroke [facility])    TIA- April 2016 last one   Supraventricular tachycardia, paroxysmal ([place])    Urinary frequency    Vaginal pruritus    Past Surgical History:  Procedure Laterality Date   ABDOMINAL HYSTERECTOMY  1982   CARDIOVASCULAR STRESS TEST  12/07/11   Normal nuclear stress test   CATARACT EXTRACTION W/ INTRAOCULAR LENS IMPLANT  ?1997   right   COLONOSCOPY     EYE SURGERY     cataract removal right eye   TOTAL ABDOMINAL HYSTERECTOMY W/ BILATERAL SALPINGOOPHORECTOMY  1982   TOTAL KNEE ARTHROPLASTY  02/08/2012   Procedure: TOTAL KNEE ARTHROPLASTY;  Surgeon: Hessie Dibble, MD;  Location: [facility];  Service: Orthopedics;  Laterality: Left;   Family History  Problem Relation Age of Onset   Breast cancer Mother 54   Lung cancer Brother 37   Cancer Brother        lung   Heart failure Father        congestive   Heart disease Father    Epilepsy Son    Kidney disease Son        tuberous sclerosis, both kidneys removed, has transplant   Diabetes Maternal Uncle    Diabetes Paternal Aunt    Cancer Maternal Grandmother        breast   Breast cancer Maternal Grandmother    Heart disease Son        [place]   COPD Son    Rectal cancer Maternal Aunt    Colon cancer Neg Hx    Esophageal cancer Neg Hx    Stomach cancer Neg Hx    Allergies as of 04/29/2021       Reactions   Benzonatate Nausea And Vomiting   "tessalon pearl"   Sulfonamide Derivatives Itching, Rash   Fosamax [alendronate]    Hctz [hydrochlorothiazide] Other (See Comments)   Severe hyponatremia    Lisinopril Cough        Medication List        Accurate as of April 29, 2021  3:04 PM. If you have any questions, ask your nurse or doctor.          albuterol 108 (90 Base) MCG/ACT inhaler Commonly  known as: ProAir HFA Inhale 2 puffs into the lungs every 4 (four) hours as needed for wheezing or shortness of breath.   amLODipine 5 MG tablet Commonly known as: NORVASC Take 1 tablet (5 mg total) by mouth daily.  aspirin EC 81 MG tablet Take 81 mg by mouth daily. Swallow whole.   budesonide-formoterol 80-4.5 MCG/ACT inhaler Commonly known as: SYMBICORT Inhale 2 puffs into the lungs 2 (two) times daily.   cholecalciferol 25 MCG (1000 UNIT) tablet Commonly known as: VITAMIN D3 Take 1,000 Units by mouth daily.   Cyanocobalamin 1000 MCG Lozg Take by mouth.   diclofenac sodium 1 % Gel Commonly known as: Voltaren Apply 2 g topically as needed.   doxycycline 100 MG tablet Commonly known as: VIBRA-TABS Take 1 tablet (100 mg total) by mouth 2 (two) times daily.   fexofenadine 180 MG tablet Commonly known as: Allegra Allergy Take 1 tablet (180 mg total) by mouth daily.   levothyroxine 75 MCG tablet Commonly known as: SYNTHROID Take 1 tablet (75 mcg total) by mouth daily before breakfast.   losartan 25 MG tablet Commonly known as: COZAAR Take 0.5 tablets (12.5 mg total) by mouth daily.   omeprazole 10 MG capsule Commonly known as: PRILOSEC Take 1 capsule (10 mg total) by mouth as needed.   predniSONE 20 MG tablet Commonly known as: DELTASONE Take 2 tablets (40 mg total) by mouth daily with breakfast.   simvastatin 10 MG tablet Commonly known as: ZOCOR Take 1 tablet (10 mg total) by mouth every evening.        All past medical history, surgical history, allergies, family history, immunizations andmedications were updated in the EMR today and reviewed under the history and medication portions of their EMR.     ROS Negative, with the exception of above mentioned in HPI   Objective:  BP 139/65    Pulse 61    Temp 97.7 F (36.5 C) (Oral)    Ht 5\' 1"  (1.549 m)    Wt 147 lb (66.7 kg)    SpO2 94%    BMI 27.78 kg/m  Body mass index is 27.78 kg/m.  Physical  Exam Vitals and nursing note reviewed.  Constitutional:      General: She is not in acute distress.    Appearance: Normal appearance. She is not ill-appearing, toxic-appearing or diaphoretic.  HENT:     Head: Normocephalic and atraumatic.  Eyes:     General: No scleral icterus.       Right eye: No discharge.        Left eye: No discharge.     Extraocular Movements: Extraocular movements intact.     Conjunctiva/sclera: Conjunctivae normal.     Pupils: Pupils are equal, round, and reactive to light.  Cardiovascular:     Rate and Rhythm: Normal rate and regular rhythm.  Pulmonary:     Effort: Pulmonary effort is normal. No respiratory distress.     Breath sounds: Normal breath sounds. No wheezing, rhonchi or rales.  Skin:    General: Skin is warm and dry.     Coloration: Skin is not jaundiced or pale.     Findings: No erythema or rash.  Neurological:     Mental Status: She is alert and oriented to person, place, and time. Mental status is at baseline.     Motor: No weakness.     Gait: Gait normal.  Psychiatric:        Mood and Affect: Mood normal.        Behavior: Behavior normal.        Thought Content: Thought content normal.        Judgment: Judgment normal.    No results found. No results found. No results found for this  or any previous visit (from the past 24 hour(s)).  Assessment/Plan: KHUSHI ZUPKO is a 88 y.o. female present for OV for  Chronic obstructive pulmonary disease with acute lower respiratory infection (HCC)-fatigue Has improved a great deal.  She endorses some fatigue and feeling winded with activity, otherwise greatly improved.  Suspect she may require Symbicort twice daily chronically, at least through the fall and winter. Continue Symbicort 1 puff twice daily > increase to  puffs BID if Shortness of breath recurs.  Return to normal routine visits, unless needed seen sooner.    Reviewed expectations re: course of current medical issues. Discussed  self-management of symptoms. Outlined signs and symptoms indicating need for more acute intervention. Patient verbalized understanding and all questions were answered. Patient received an After-Visit Summary.    No orders of the defined types were placed in this encounter.   Meds ordered this encounter  Medications   budesonide-formoterol (SYMBICORT) 80-4.5 MCG/ACT inhaler    Sig: Inhale 2 puffs into the lungs 2 (two) times daily.    Dispense:  1 each    Refill:  5   Referral Orders  No referral(s) requested today     Note is dictated utilizing voice recognition software. Although note has been proof read prior to signing, occasional typographical errors still can be missed. If any questions arise, please do not hesitate to call for verification.   electronically signed by:  Howard Pouch, DO  [place]

## 2021-04-29 NOTE — Patient Instructions (Signed)
Glad you are feeling better.  Use Symbicort one puff - every 12 hours. If you still feel short of breath , then increase back to 2 puffs every 12 hours

## 2021-06-08 ENCOUNTER — Telehealth: Payer: Self-pay

## 2021-06-08 MED ORDER — BUDESONIDE-FORMOTEROL FUMARATE 160-4.5 MCG/ACT IN AERO: 1.0000 | INHALATION_SPRAY | Freq: Two times a day (BID) | RESPIRATORY_TRACT | 11 refills | Status: DC

## 2021-06-08 NOTE — Telephone Encounter (Signed)
Received notification that pt's insurance is not covering symbicort generic inhaler. Alternative Rx is: symbicort, advair hfa, breo, fluticasone/salmeterol, and dulera

## 2021-06-08 NOTE — Telephone Encounter (Signed)
Changed prescription to the Symbicort higher dose which seems to be on her formulary.  Per paperwork received.  The only changes she will take 1 puff twice a day (instead of 2 puffs twice a day).

## 2021-06-08 NOTE — Addendum Note (Signed)
Addended by: Howard Pouch A on: 06/08/2021 05:46 PM   Modules accepted: Orders

## 2021-06-09 NOTE — Telephone Encounter (Signed)
Spoke with patient regarding results/recommendations.  

## 2021-07-06 ENCOUNTER — Other Ambulatory Visit: Payer: Self-pay | Admitting: Family Medicine

## 2021-07-06 ENCOUNTER — Ambulatory Visit (INDEPENDENT_AMBULATORY_CARE_PROVIDER_SITE_OTHER): Payer: Medicare Other | Admitting: Family Medicine

## 2021-07-06 ENCOUNTER — Other Ambulatory Visit: Payer: Self-pay

## 2021-07-06 ENCOUNTER — Encounter: Payer: Self-pay | Admitting: Family Medicine

## 2021-07-06 VITALS — BP 133/66 | HR 57 | Temp 98.0°F | Ht 61.0 in | Wt 142.0 lb

## 2021-07-06 DIAGNOSIS — Z1231 Encounter for screening mammogram for malignant neoplasm of breast: Secondary | ICD-10-CM | POA: Diagnosis not present

## 2021-07-06 DIAGNOSIS — E7800 Pure hypercholesterolemia, unspecified: Secondary | ICD-10-CM

## 2021-07-06 DIAGNOSIS — R7303 Prediabetes: Secondary | ICD-10-CM

## 2021-07-06 DIAGNOSIS — M816 Localized osteoporosis [Lequesne]: Secondary | ICD-10-CM | POA: Diagnosis not present

## 2021-07-06 DIAGNOSIS — I729 Aneurysm of unspecified site: Secondary | ICD-10-CM | POA: Diagnosis not present

## 2021-07-06 DIAGNOSIS — Z0000 Encounter for general adult medical examination without abnormal findings: Secondary | ICD-10-CM

## 2021-07-06 DIAGNOSIS — E063 Autoimmune thyroiditis: Secondary | ICD-10-CM | POA: Diagnosis not present

## 2021-07-06 DIAGNOSIS — E785 Hyperlipidemia, unspecified: Secondary | ICD-10-CM | POA: Diagnosis not present

## 2021-07-06 DIAGNOSIS — E538 Deficiency of other specified B group vitamins: Secondary | ICD-10-CM

## 2021-07-06 DIAGNOSIS — E038 Other specified hypothyroidism: Secondary | ICD-10-CM

## 2021-07-06 DIAGNOSIS — I422 Other hypertrophic cardiomyopathy: Secondary | ICD-10-CM

## 2021-07-06 DIAGNOSIS — E559 Vitamin D deficiency, unspecified: Secondary | ICD-10-CM

## 2021-07-06 DIAGNOSIS — E663 Overweight: Secondary | ICD-10-CM

## 2021-07-06 DIAGNOSIS — I10 Essential (primary) hypertension: Secondary | ICD-10-CM

## 2021-07-06 LAB — COMPREHENSIVE METABOLIC PANEL
ALT: 8 U/L (ref 0–35)
AST: 15 U/L (ref 0–37)
Albumin: 4.5 g/dL (ref 3.5–5.2)
Alkaline Phosphatase: 76 U/L (ref 39–117)
BUN: 12 mg/dL (ref 6–23)
CO2: 28 mEq/L (ref 19–32)
Calcium: 9 mg/dL (ref 8.4–10.5)
Chloride: 100 mEq/L (ref 96–112)
Creatinine, Ser: 0.74 mg/dL (ref 0.40–1.20)
GFR: 73.67 mL/min (ref >60.00)
Glucose, Bld: 97 mg/dL (ref 70–99)
Potassium: 4.1 mEq/L (ref 3.5–5.1)
Sodium: 136 mEq/L (ref 135–145)
Total Bilirubin: 0.4 mg/dL (ref 0.2–1.2)
Total Protein: 6.9 g/dL (ref 6.0–8.3)

## 2021-07-06 LAB — LIPID PANEL
Cholesterol: 128 mg/dL (ref 0–200)
HDL: 55.8 mg/dL (ref >39.00)
LDL Cholesterol: 53 mg/dL (ref 0–99)
NonHDL: 72.67
Total CHOL/HDL Ratio: 2
Triglycerides: 100 mg/dL (ref 0.0–149.0)
VLDL: 20 mg/dL (ref 0.0–40.0)

## 2021-07-06 LAB — CBC
HCT: 39.6 % (ref 36.0–46.0)
Hemoglobin: 13.2 g/dL (ref 12.0–15.0)
MCHC: 33.2 g/dL (ref 30.0–36.0)
MCV: 84.3 fl (ref 78.0–100.0)
Platelets: 218 10*3/uL (ref 150.0–400.0)
RBC: 4.7 Mil/uL (ref 3.87–5.11)
RDW: 13.3 % (ref 11.5–15.5)
WBC: 5.8 10*3/uL (ref 4.0–10.5)

## 2021-07-06 LAB — TSH: TSH: 1.83 u[IU]/mL (ref 0.35–5.50)

## 2021-07-06 LAB — VITAMIN D 25 HYDROXY (VIT D DEFICIENCY, FRACTURES): VITD: 22.88 ng/mL — ABNORMAL LOW (ref 30.00–100.00)

## 2021-07-06 LAB — VITAMIN B12: Vitamin B-12: 918 pg/mL — ABNORMAL HIGH (ref 211–911)

## 2021-07-06 LAB — HEMOGLOBIN A1C: Hgb A1c MFr Bld: 6 % (ref 4.6–6.5)

## 2021-07-06 LAB — T4, FREE: Free T4: 1.08 ng/dL (ref 0.60–1.60)

## 2021-07-06 MED ORDER — SIMVASTATIN 10 MG PO TABS: 10.0000 mg | ORAL_TABLET | Freq: Every evening | ORAL | 4 refills | Status: DC

## 2021-07-06 MED ORDER — OMEPRAZOLE 10 MG PO CPDR: 10.0000 mg | DELAYED_RELEASE_CAPSULE | ORAL | 3 refills | Status: DC | PRN

## 2021-07-06 MED ORDER — LOSARTAN POTASSIUM 25 MG PO TABS: 12.5000 mg | ORAL_TABLET | Freq: Every day | ORAL | 1 refills | Status: DC

## 2021-07-06 MED ORDER — SHINGRIX 50 MCG/0.5ML IM SUSR: 0.5000 mL | Freq: Once | INTRAMUSCULAR | 1 refills | Status: AC

## 2021-07-06 MED ORDER — AMLODIPINE BESYLATE 5 MG PO TABS: 5.0000 mg | ORAL_TABLET | Freq: Every day | ORAL | 1 refills | Status: DC

## 2021-07-06 MED ORDER — LEVOTHYROXINE SODIUM 75 MCG PO TABS: 75.0000 ug | ORAL_TABLET | Freq: Every day | ORAL | 3 refills | Status: DC

## 2021-07-06 MED ORDER — FEXOFENADINE HCL 180 MG PO TABS: 180.0000 mg | ORAL_TABLET | Freq: Every day | ORAL | 3 refills | Status: DC

## 2021-07-06 NOTE — Patient Instructions (Addendum)
? ? ? ?Great to see you today.  ?I have refilled the medication(s) we provide.  ? ?If labs were collected, we will inform you of lab results once received either by echart message or telephone call.  ? - echart message- for normal results that have been seen by the patient already.  ? - telephone call: abnormal results or if patient has not viewed results in their echart.  ? ? ?Health Maintenance After Age 86 ?After age 86, you are at a higher risk for certain long-term diseases and infections as well as injuries from falls. Falls are a major cause of broken bones and head injuries in people who are older than age 46. Getting regular preventive care can help to keep you healthy and well. Preventive care includes getting regular testing and making lifestyle changes as recommended by your health care provider. Talk with your health care provider about: ?Which screenings and tests you should have. A screening is a test that checks for a disease when you have no symptoms. ?A diet and exercise plan that is right for you. ?What should I know about screenings and tests to prevent falls? ?Screening and testing are the best ways to find a health problem early. Early diagnosis and treatment give you the best chance of managing medical conditions that are common after age 25. Certain conditions and lifestyle choices may make you more likely to have a fall. Your health care provider may recommend: ?Regular vision checks. Poor vision and conditions such as cataracts can make you more likely to have a fall. If you wear glasses, make sure to get your prescription updated if your vision changes. ?Medicine review. Work with your health care provider to regularly review all of the medicines you are taking, including over-the-counter medicines. Ask your health care provider about any side effects that may make you more likely to have a fall. Tell your health care provider if any medicines that you take make you feel dizzy or  sleepy. ?Strength and balance checks. Your health care provider may recommend certain tests to check your strength and balance while standing, walking, or changing positions. ?Foot health exam. Foot pain and numbness, as well as not wearing proper footwear, can make you more likely to have a fall. ?Screenings, including: ?Osteoporosis screening. Osteoporosis is a condition that causes the bones to get weaker and break more easily. ?Blood pressure screening. Blood pressure changes and medicines to control blood pressure can make you feel dizzy. ?Depression screening. You may be more likely to have a fall if you have a fear of falling, feel depressed, or feel unable to do activities that you used to do. ?Alcohol use screening. Using too much alcohol can affect your balance and may make you more likely to have a fall. ?Follow these instructions at home: ?Lifestyle ?Do not drink alcohol if: ?Your health care provider tells you not to drink. ?If you drink alcohol: ?Limit how much you have to: ?0-1 drink a day for women. ?0-2 drinks a day for men. ?Know how much alcohol is in your drink. In the [place] one drink equals one 12 oz bottle of beer (355 mL), one 5 oz glass of wine (148 mL), or one 1? oz glass of hard liquor (44 mL). ?Do not use any products that contain nicotine or tobacco. These products include cigarettes, chewing tobacco, and vaping devices, such as e-cigarettes. If you need help quitting, ask your health care provider. ?Activity ? ?Follow a regular exercise program to stay  fit. This will help you maintain your balance. Ask your health care provider what types of exercise are appropriate for you. ?If you need a cane or walker, use it as recommended by your health care provider. ?Wear supportive shoes that have nonskid soles. ?Safety ? ?Remove any tripping hazards, such as rugs, cords, and clutter. ?Install safety equipment such as grab bars in bathrooms and safety rails on stairs. ?Keep rooms and walkways  well-lit. ?General instructions ?Talk with your health care provider about your risks for falling. Tell your health care provider if: ?You fall. Be sure to tell your health care provider about all falls, even ones that seem minor. ?You feel dizzy, tiredness (fatigue), or off-balance. ?Take over-the-counter and prescription medicines only as told by your health care provider. These include supplements. ?Eat a healthy diet and maintain a healthy weight. A healthy diet includes low-fat dairy products, low-fat (lean) meats, and fiber from whole grains, beans, and lots of fruits and vegetables. ?Stay current with your vaccines. ?Schedule regular health, dental, and eye exams. ?Summary ?Having a healthy lifestyle and getting preventive care can help to protect your health and wellness after age 60. ?Screening and testing are the best way to find a health problem early and help you avoid having a fall. Early diagnosis and treatment give you the best chance for managing medical conditions that are more common for people who are older than age 22. ?Falls are a major cause of broken bones and head injuries in people who are older than age 86. Take precautions to prevent a fall at home. ?Work with your health care provider to learn what changes you can make to improve your health and wellness and to prevent falls. ?This information is not intended to replace advice given to you by your health care provider. Make sure you discuss any questions you have with your health care provider. ?Document Revised: 08/23/2020 Document Reviewed: 08/23/2020 ?Elsevier Patient Education ? [place]. ? ?

## 2021-07-06 NOTE — Progress Notes (Signed)
? ? ?Amy Rodriguez , 12-04-1933, 88 y.o., female ?MRN: [id number] ?Patient Care Team  ?  Relationship Specialty Notifications Start End  ?Ma Hillock, DO PCP - General Family Medicine  01/08/15   ?Shon Hough, MD Consulting Physician Ophthalmology  08/03/15   ?Collene Gobble, MD Consulting Physician Pulmonary Disease  08/03/15   ?Lelon Perla, MD Consulting Physician Cardiology  08/03/15   ?Garvin Fila, MD Consulting Physician Neurology  08/03/15   ?Inda Castle, MD (Inactive) Consulting Physician Gastroenterology  08/04/15   ?Philemon Kingdom, MD Consulting Physician Internal Medicine  08/07/16   ?Melrose Nakayama, MD Consulting Physician Orthopedic Surgery  08/07/16   ? ? ?Chief Complaint  ?Patient presents with  ? Annual Exam  ?  Pt is fasting  ? ? ?Subjective: Amy Rodriguez is a 88 y.o. female present for [place] follow up.  ? ?Health maintenance:  ?Colonoscopy: completed 2017 1 polyp, no further testing needed secondary to age. , by dr. Loletha Carrow ?Mammogram: completed:09/2020, bc-gso> ordered ?Immunizations: tdap 2014 UTD, Influenza UTD (encouraged yearly), PNA series completed, zostavax completed, covid UTD, shingrix script printed.  ?Infectious disease screening: completed ?Cervical cancer screening:> 65 n/i ?Dexa: osteoporosis- UTD 09/2020- rpt 2 yrs (-2.9) ? ?Hypertension/hyperlipidemia/overweight/tachycardia/CAD: Patient reports compliance with amlodipine 5 mg daily and losartan 12.5 mg.  Patient reports complaince with daily baby aspirin and Zocor 10 mg daily. Patient does have a small MCA aneurysm, SVT, history of stroke/tia and is followed by cardiology.  She had been on atenolol low-dose and this was discontinued recently by cardiology.  She also been tried on lisinopril but noticed a tickle cough and changed to losartan. Patient denies chest pain, shortness of breath, dizziness or lower extremity edema.  ? ?osteopenia, unspecified location ?Up-to-date 09/17/2020 ? ?B12 deficiency/ Vitamin D  deficiency ?Patient reports she has continue to supplementation and feels much improved. ? ?Aneurysm (HCC)/Apical variant hypertrophic cardiomyopathy ([place]) ?Follows with cardiology ? ?Hypothyroid: Patient reports compliance with levothyroxine 75 ?g daily.   ? ?GERD: Patient reports she takes Prilosec 10 mg as needed  Medication works well. ? ? ? ?  07/06/2021  ? 10:13 AM 01/19/2021  ?  1:45 PM 03/23/2020  ?  1:56 PM 09/27/2018  ?  1:30 PM 08/14/2017  ?  2:07 PM  ?Depression screen PHQ 2/9  ?Decreased Interest 0 0 0 0 0  ?Down, Depressed, Hopeless 0 0  0 0  ?PHQ - 2 Score 0 0 0 0 0  ? ? ?Allergies  ?Allergen Reactions  ? Benzonatate Nausea And Vomiting  ?  "tessalon pearl"  ? Sulfonamide Derivatives Itching and Rash  ? Fosamax [Alendronate]   ? Hctz [Hydrochlorothiazide] Other (See Comments)  ?  Severe hyponatremia   ? Lisinopril Cough  ? ?Social History  ? ?Tobacco Use  ? Smoking status: Former  ?  Packs/day: 1.00  ?  Years: 25.00  ?  Pack years: 25.00  ?  Types: Cigarettes  ?  Quit date: 04/17/1980  ?  Years since quitting: 41.2  ? Smokeless tobacco: Never  ? Tobacco comments:  ?  passive smoker, husband smoked  ?Substance Use Topics  ? Alcohol use: No  ? ?Past Medical History:  ?Diagnosis Date  ? Arthritis   ? sed rate 10, RF, CCP pending  ? Blood in stool   ? Cervical cancer ([place])   ? s/p hysterectomy/oop  ? DDD (degenerative disc disease), lumbosacral   ? Depression   ? Due to husbands passing away 09/22/2011  ?  Diverticulosis of colon   ? Environmental allergies   ? GERD (gastroesophageal reflux disease)   ? H pylori ulcer   ? not treated due to expense  10/2001  ? Hand dermatitis   ? HTN (hypertension)   ? Hyperlipidemia   ? Hypothyroidism   ? TSH 14.488 (11/2005)  ? IBS (irritable bowel syndrome)   ? Lactose intolerance   ? Migraines   ? "get them very rarely now" (02/08/2012)  ? Multiple pigmented nevi   ? last derm evaluation 01/2003  ? OA (osteoarthritis)   ? multiple sites  ? Other seborrheic keratosis   ? Pain in  joint, lower leg   ? Pneumonia   ? "couple times in my lifetime" (02/08/2012)  ? PONV (postoperative nausea and vomiting)   ? "and takes me a long time to come out under it" (02/08/2012)  ? Posterior vitreous detachment 1996  ? Postmenopausal   ? s/p hysterectomy for h/o cervical cancer 1982 (both ovaries taken at that time) now on hormonal replacement   ? Postmenopausal HRT (hormone replacement therapy)   ? PPD positive   ? history +PPD 1984, no treatment, no abnormal CXR  ? Shortness of breath   ? with ambulation  ? Stroke [facility])   ? TIA- April 2016 last one  ? Supraventricular tachycardia, paroxysmal ([place])   ? Urinary frequency   ? Vaginal pruritus   ? ?Past Surgical History:  ?Procedure Laterality Date  ? ABDOMINAL HYSTERECTOMY  1982  ? CARDIOVASCULAR STRESS TEST  12/07/11  ? Normal nuclear stress test  ? CATARACT EXTRACTION W/ INTRAOCULAR LENS IMPLANT  ?1997  ? right  ? COLONOSCOPY    ? EYE SURGERY    ? cataract removal right eye  ? TOTAL ABDOMINAL HYSTERECTOMY W/ BILATERAL SALPINGOOPHORECTOMY  1982  ? TOTAL KNEE ARTHROPLASTY  02/08/2012  ? Procedure: TOTAL KNEE ARTHROPLASTY;  Surgeon: Hessie Dibble, MD;  Location: [place];  Service: Orthopedics;  Laterality: Left;  ? ?Family History  ?Problem Relation Age of Onset  ? Breast cancer Mother 28  ? Lung cancer Brother 39  ? Cancer Brother   ?     lung  ? Heart failure Father   ?     congestive  ? Heart disease Father   ? Epilepsy Son   ? Kidney disease Son   ?     tuberous sclerosis, both kidneys removed, has transplant  ? Diabetes Maternal Uncle   ? Diabetes Paternal Aunt   ? Cancer Maternal Grandmother   ?     breast  ? Breast cancer Maternal Grandmother   ? Heart disease Son   ?     [place]  ? COPD Son   ? Rectal cancer Maternal Aunt   ? Colon cancer Neg Hx   ? Esophageal cancer Neg Hx   ? Stomach cancer Neg Hx   ? ?Allergies as of 07/06/2021   ? ?   Reactions  ? Benzonatate Nausea And Vomiting  ? "tessalon pearl"  ? Sulfonamide Derivatives Itching, Rash   ? Fosamax [alendronate]   ? Hctz [hydrochlorothiazide] Other (See Comments)  ? Severe hyponatremia   ? Lisinopril Cough  ? ?  ? ?  ?Medication List  ?  ? ?  ? Accurate as of July 06, 2021 10:52 AM. If you have any questions, ask your nurse or doctor.  ?  ?  ? ?  ? ?STOP taking these medications   ? ?doxycycline 100 MG tablet ?Commonly known  as: VIBRA-TABS ?Stopped by: Howard Pouch, DO ?  ?predniSONE 20 MG tablet ?Commonly known as: DELTASONE ?Stopped by: Howard Pouch, DO ?  ? ?  ? ?TAKE these medications   ? ?albuterol 108 (90 Base) MCG/ACT inhaler ?Commonly known as: ProAir HFA ?Inhale 2 puffs into the lungs every 4 (four) hours as needed for wheezing or shortness of breath. ?  ?amLODipine 5 MG tablet ?Commonly known as: NORVASC ?Take 1 tablet (5 mg total) by mouth daily. ?  ?aspirin EC 81 MG tablet ?Take 81 mg by mouth daily. Swallow whole. ?  ?budesonide-formoterol 160-4.5 MCG/ACT inhaler ?Commonly known as: Symbicort ?Inhale 1 puff into the lungs 2 (two) times daily. ?  ?cholecalciferol 25 MCG (1000 UNIT) tablet ?Commonly known as: VITAMIN D3 ?Take 1,000 Units by mouth daily. ?  ?Cyanocobalamin 1000 MCG Lozg ?Take by mouth. ?  ?diclofenac sodium 1 % Gel ?Commonly known as: Voltaren ?Apply 2 g topically as needed. ?  ?fexofenadine 180 MG tablet ?Commonly known as: Allegra Allergy ?Take 1 tablet (180 mg total) by mouth daily. ?  ?levothyroxine 75 MCG tablet ?Commonly known as: SYNTHROID ?Take 1 tablet (75 mcg total) by mouth daily before breakfast. ?  ?losartan 25 MG tablet ?Commonly known as: COZAAR ?Take 0.5 tablets (12.5 mg total) by mouth daily. ?  ?omeprazole 10 MG capsule ?Commonly known as: PRILOSEC ?Take 1 capsule (10 mg total) by mouth as needed. ?  ?Shingrix injection ?Generic drug: Zoster Vaccine Adjuvanted ?Inject 0.5 mLs into the muscle once for 1 dose. ?Started by: Howard Pouch, DO ?  ?simvastatin 10 MG tablet ?Commonly known as: ZOCOR ?Take 1 tablet (10 mg total) by mouth every evening. ?  ? ?   ? ? ?No results found for this or any previous visit (from the past 24 hour(s)). ? ?No results found. ? ? ?ROS: Negative, with the exception of above mentioned in HPI ? ? ?Objective:  ?BP 133/66   Pulse (

## 2021-08-29 DIAGNOSIS — M25562 Pain in left knee: Secondary | ICD-10-CM | POA: Diagnosis not present

## 2021-08-30 NOTE — Progress Notes (Signed)
HPI:FU SVT. Nuclear study in August of 2013 showed an ejection fraction of 76% and normal perfusion. Holter 12/14 showed sinus with pacs, pvcs and rare couplet. Carotid dopplers 8/18 showed 1-39 bilateral stenosis.  Echocardiogram June 2021 showed normal LV function, mild left ventricular hypertrophy, mild RV dysfunction, trace aortic insufficiency and possible small PFO.  There was also question of apical hypertrophic cardiomyopathy and hypertrabeculation of the RV apex.  Since last seen,  Current Outpatient Medications  Medication Sig Dispense Refill   albuterol (PROAIR HFA) 108 (90 Base) MCG/ACT inhaler Inhale 2 puffs into the lungs every 4 (four) hours as needed for wheezing or shortness of breath. 1 Inhaler 3   amLODipine (NORVASC) 5 MG tablet Take 1 tablet (5 mg total) by mouth daily. 90 tablet 1   amoxicillin-clavulanate (AUGMENTIN) 875-125 MG tablet Take 1 tablet by mouth 2 (two) times daily. 20 tablet 0   aspirin EC 81 MG tablet Take 81 mg by mouth daily. Swallow whole.     budesonide-formoterol (SYMBICORT) 160-4.5 MCG/ACT inhaler Inhale 1 puff into the lungs 2 (two) times daily. 1 each 11   cholecalciferol (VITAMIN D3) 25 MCG (1000 UNIT) tablet Take 1,000 Units by mouth daily.     Cyanocobalamin 1000 MCG LOZG Take by mouth.     diclofenac sodium (VOLTAREN) 1 % GEL Apply 2 g topically as needed. 100 g 11   fexofenadine (ALLEGRA ALLERGY) 180 MG tablet Take 1 tablet (180 mg total) by mouth daily. 90 tablet 3   HYDROcodone bit-homatropine (HYCODAN) 5-1.5 MG/5ML syrup Take 5 mLs by mouth every 8 (eight) hours as needed for cough. 120 mL 0   levothyroxine (SYNTHROID) 75 MCG tablet Take 1 tablet (75 mcg total) by mouth daily before breakfast. 90 tablet 3   losartan (COZAAR) 25 MG tablet Take 0.5 tablets (12.5 mg total) by mouth daily. 45 tablet 1   omeprazole (PRILOSEC) 10 MG capsule Take 1 capsule (10 mg total) by mouth as needed. 90 capsule 3   simvastatin (ZOCOR) 10 MG tablet Take 1  tablet (10 mg total) by mouth every evening. 90 tablet 4   No current facility-administered medications for this visit.     Past Medical History:  Diagnosis Date   Arthritis    sed rate 10, RF, CCP pending   Blood in stool    Cervical cancer (HCC)    s/p hysterectomy/oop   DDD (degenerative disc disease), lumbosacral    Depression    Due to husbands passing away 09/22/2011   Diverticulosis of colon    Environmental allergies    GERD (gastroesophageal reflux disease)    H pylori ulcer    not treated due to expense  10/2001   Hand dermatitis    HTN (hypertension)    Hyperlipidemia    Hypothyroidism    TSH 14.488 (11/2005)   IBS (irritable bowel syndrome)    Lactose intolerance    Migraines    "get them very rarely now" (02/08/2012)   Multiple pigmented nevi    last derm evaluation 01/2003   OA (osteoarthritis)    multiple sites   Other seborrheic keratosis    Pain in joint, lower leg    Pneumonia    "couple times in my lifetime" (02/08/2012)   PONV (postoperative nausea and vomiting)    "and takes me a long time to come out under it" (02/08/2012)   Posterior vitreous detachment 1996   Postmenopausal    s/p hysterectomy for h/o cervical cancer 1982 (  both ovaries taken at that time) now on hormonal replacement    Postmenopausal HRT (hormone replacement therapy)    PPD positive    history +PPD 1984, no treatment, no abnormal CXR   Shortness of breath    with ambulation   Stroke [facility])    TIA- April 2016 last one   Supraventricular tachycardia, paroxysmal (HCC)    Urinary frequency    Vaginal pruritus     Past Surgical History:  Procedure Laterality Date   ABDOMINAL HYSTERECTOMY  1982   CARDIOVASCULAR STRESS TEST  12/07/11   Normal nuclear stress test   CATARACT EXTRACTION W/ INTRAOCULAR LENS IMPLANT  ?1997   right   COLONOSCOPY     EYE SURGERY     cataract removal right eye   TOTAL ABDOMINAL HYSTERECTOMY W/ BILATERAL SALPINGOOPHORECTOMY  1982   TOTAL KNEE  ARTHROPLASTY  02/08/2012   Procedure: TOTAL KNEE ARTHROPLASTY;  Surgeon: Hessie Dibble, MD;  Location: [place];  Service: Orthopedics;  Laterality: Left;    Social History   Socioeconomic History   Marital status: Widowed    Spouse name: Not on file   Number of children: 3   Years of education: 12TH   Highest education level: Not on file  Occupational History   Occupation: retired    Comment: Academic librarian: RETIRED  Tobacco Use   Smoking status: Former    Packs/day: 1.00    Years: 25.00    Pack years: 25.00    Types: Cigarettes    Quit date: 04/17/1980    Years since quitting: 41.4   Smokeless tobacco: Never   Tobacco comments:    passive smoker, husband smoked  Vaping Use   Vaping Use: Never used  Substance and Sexual Activity   Alcohol use: No   Drug use: No   Sexual activity: Never  Other Topics Concern   Not on file  Social History Narrative   Lives with son, Amy Rodriguez who has medical problems-they help each other. Her husband died 10-01-22 after 55 years of marriage.  She has 2 other sons that live within 2 hrs from her. She does not drive-she uses [place] medical service. She used to work in Scientist, research (medical), retired.     Tobacco: 25 pack yr hx, quit 1985.   Social Determinants of Health   Financial Resource Strain: Low Risk    Difficulty of Paying Living Expenses: Not hard at all  Food Insecurity: No Food Insecurity   Worried About Charity fundraiser in the Last Year: Never true   Arboriculturist in the Last Year: Never true  Transportation Needs: Not on file  Physical Activity: Not on file  Stress: No Stress Concern Present   Feeling of Stress : Not at all  Social Connections: Moderately Isolated   Frequency of Communication with Friends and Family: More than three times a week   Frequency of Social Gatherings with Friends and Family: Three times a week   Attends Religious Services: More than 4 times per year   Active Member of Clubs or Organizations: No   Attends English as a second language teacher Meetings: Never   Marital Status: Widowed  Intimate Partner Violence: Not on file    Family History  Problem Relation Age of Onset   Breast cancer Mother 74   Lung cancer Brother 73   Cancer Brother        lung   Heart failure Father        congestive  Heart disease Father    Epilepsy Son    Kidney disease Son        tuberous sclerosis, both kidneys removed, has transplant   Diabetes Maternal Uncle    Diabetes Paternal Aunt    Cancer Maternal Grandmother        breast   Breast cancer Maternal Grandmother    Heart disease Son        [place]   COPD Son    Rectal cancer Maternal Aunt    Colon cancer Neg Hx    Esophageal cancer Neg Hx    Stomach cancer Neg Hx     ROS: no fevers or chills, productive cough, hemoptysis, dysphasia, odynophagia, melena, hematochezia, dysuria, hematuria, rash, seizure activity, orthopnea, PND, pedal edema, claudication. Remaining systems are negative.  Physical Exam: Well-developed well-nourished in no acute distress.  Skin is warm and dry.  HEENT is normal.  Neck is supple.  Chest is clear to auscultation with normal expansion.  Cardiovascular exam is regular rate and rhythm.  Abdominal exam nontender or distended. No masses palpated. Extremities show no edema. neuro grossly intact  ECG-sinus bradycardia at a rate of 51, right bundle branch block.  Personally reviewed  A/P  1 SVT-patient denies any recurrences.  Note beta-blockade was discontinued previously due to bradycardia.  2 hyperlipidemia-continue statin.  3 hypertension-blood pressure controlled.  Continue present medical regimen.  4 carotid artery disease-mild on most recent Dopplers.  5 lower extremity edema-unchanged.  She will continue with feet elevation and compression hose.  Kirk Ruths, MD

## 2021-08-31 ENCOUNTER — Ambulatory Visit (INDEPENDENT_AMBULATORY_CARE_PROVIDER_SITE_OTHER): Payer: Medicare Other | Admitting: Family Medicine

## 2021-08-31 VITALS — BP 149/75 | HR 62 | Temp 97.7°F | Wt 146.4 lb

## 2021-08-31 DIAGNOSIS — J329 Chronic sinusitis, unspecified: Secondary | ICD-10-CM

## 2021-08-31 DIAGNOSIS — R051 Acute cough: Secondary | ICD-10-CM | POA: Diagnosis not present

## 2021-08-31 DIAGNOSIS — R0981 Nasal congestion: Secondary | ICD-10-CM | POA: Diagnosis not present

## 2021-08-31 DIAGNOSIS — B9689 Other specified bacterial agents as the cause of diseases classified elsewhere: Secondary | ICD-10-CM | POA: Diagnosis not present

## 2021-08-31 LAB — POC COVID19 BINAXNOW: SARS Coronavirus 2 Ag: NEGATIVE

## 2021-08-31 MED ORDER — AMOXICILLIN-POT CLAVULANATE 875-125 MG PO TABS: 1.0000 | ORAL_TABLET | Freq: Two times a day (BID) | ORAL | 0 refills | Status: DC

## 2021-08-31 MED ORDER — HYDROCODONE BIT-HOMATROP MBR 5-1.5 MG/5ML PO SOLN: 5.0000 mL | Freq: Three times a day (TID) | ORAL | 0 refills | Status: DC | PRN

## 2021-08-31 MED ORDER — METHYLPREDNISOLONE ACETATE 80 MG/ML IJ SUSP
80.0000 mg | Freq: Once | INTRAMUSCULAR | Status: AC
  Administered 2021-08-31: 80 mg via INTRAMUSCULAR

## 2021-08-31 NOTE — Progress Notes (Signed)
? ? ? ?Amy Rodriguez , [birth year], 88 y.o., female ?MRN: [id number] ?Patient Care Team  ?  Relationship Specialty Notifications Start End  ?Ma Hillock, DO PCP - General Family Medicine  01/08/15   ?Shon Hough, MD Consulting Physician Ophthalmology  08/03/15   ?Collene Gobble, MD Consulting Physician Pulmonary Disease  08/03/15   ?Lelon Perla, MD Consulting Physician Cardiology  08/03/15   ?Garvin Fila, MD Consulting Physician Neurology  08/03/15   ?Inda Castle, MD (Inactive) Consulting Physician Gastroenterology  08/04/15   ?Philemon Kingdom, MD Consulting Physician Internal Medicine  08/07/16   ?Melrose Nakayama, MD Consulting Physician Orthopedic Surgery  08/07/16   ? ? ?Chief Complaint  ?Patient presents with  ? Cough  ?  Cough congestion runny nose starting around 05/10. Has not taken meds today  ? ?  ?Subjective: Pt presents for an OV with complaints of cough and nasal congestion of 7 days duration.  Associated symptoms include cough that is keeping her up at night.  Mild sore throat.  Runny nose.  Headache.  Sinus pressure.  Patient denies fevers, chills or shortness of breath.  She denies any wheezing.  She is taking Symbicort, albuterol, Advil and Flonase. ? ? ?  07/06/2021  ? 10:13 AM 01/19/2021  ?  1:45 PM 03/23/2020  ?  1:56 PM 09/27/2018  ?  1:30 PM 08/14/2017  ?  2:07 PM  ?Depression screen PHQ 2/9  ?Decreased Interest 0 0 0 0 0  ?Down, Depressed, Hopeless 0 0  0 0  ?PHQ - 2 Score 0 0 0 0 0  ? ? ?Allergies  ?Allergen Reactions  ? Benzonatate Nausea And Vomiting  ?  "tessalon pearl"  ? Sulfonamide Derivatives Itching and Rash  ? Fosamax [Alendronate]   ? Hctz [Hydrochlorothiazide] Other (See Comments)  ?  Severe hyponatremia   ? Lisinopril Cough  ? ?Social History  ? ?Social History Narrative  ? Lives with son, [name] who has medical problems-they help each other. Her husband died 10-23-2022 after 35 years of marriage.  She has 2 other sons that live within 2 hrs from her. She does not  drive-she uses [place] [facility] service. She used to work in Scientist, research (medical), retired.    ? Tobacco: 25 pack yr hx, quit 1985.  ? ?Past Medical History:  ?Diagnosis Date  ? Arthritis   ? sed rate 10, RF, CCP pending  ? Blood in stool   ? Cervical cancer ([place])   ? s/p hysterectomy/oop  ? DDD (degenerative disc disease), lumbosacral   ? Depression   ? Due to husbands passing away 09/22/2011  ? Diverticulosis of colon   ? Environmental allergies   ? GERD (gastroesophageal reflux disease)   ? H pylori ulcer   ? not treated due to expense  10/2001  ? Hand dermatitis   ? HTN (hypertension)   ? Hyperlipidemia   ? Hypothyroidism   ? TSH 14.488 (11/2005)  ? IBS (irritable bowel syndrome)   ? Lactose intolerance   ? Migraines   ? "get them very rarely now" (02/08/2012)  ? Multiple pigmented nevi   ? last derm evaluation 01/2003  ? OA (osteoarthritis)   ? multiple sites  ? Other seborrheic keratosis   ? Pain in joint, lower leg   ? Pneumonia   ? "couple times in my lifetime" (02/08/2012)  ? PONV (postoperative nausea and vomiting)   ? "and takes me a long time to come out under it" (02/08/2012)  ?  Posterior vitreous detachment 1996  ? Postmenopausal   ? s/p hysterectomy for h/o cervical cancer 1982 (both ovaries taken at that time) now on hormonal replacement   ? Postmenopausal HRT (hormone replacement therapy)   ? PPD positive   ? history +PPD 1984, no treatment, no abnormal CXR  ? Shortness of breath   ? with ambulation  ? Stroke [facility])   ? TIA- April 2016 last one  ? Supraventricular tachycardia, paroxysmal ([place])   ? Urinary frequency   ? Vaginal pruritus   ? ?Past Surgical History:  ?Procedure Laterality Date  ? ABDOMINAL HYSTERECTOMY  1982  ? CARDIOVASCULAR STRESS TEST  12/07/11  ? Normal nuclear stress test  ? CATARACT EXTRACTION W/ INTRAOCULAR LENS IMPLANT  ?1997  ? right  ? COLONOSCOPY    ? EYE SURGERY    ? cataract removal right eye  ? TOTAL ABDOMINAL HYSTERECTOMY W/ BILATERAL SALPINGOOPHORECTOMY  1982  ? TOTAL KNEE ARTHROPLASTY   02/08/2012  ? Procedure: TOTAL KNEE ARTHROPLASTY;  Surgeon: Hessie Dibble, MD;  Location: [place];  Service: Orthopedics;  Laterality: Left;  ? ?Family History  ?Problem Relation Age of Onset  ? Breast cancer Mother 47  ? Lung cancer Brother 25  ? Cancer Brother   ?     lung  ? Heart failure Father   ?     congestive  ? Heart disease Father   ? Epilepsy Son   ? Kidney disease Son   ?     tuberous sclerosis, both kidneys removed, has transplant  ? Diabetes Maternal Uncle   ? Diabetes Paternal Aunt   ? Cancer Maternal Grandmother   ?     breast  ? Breast cancer Maternal Grandmother   ? Heart disease Son   ?     [place]  ? COPD Son   ? Rectal cancer Maternal Aunt   ? Colon cancer Neg Hx   ? Esophageal cancer Neg Hx   ? Stomach cancer Neg Hx   ? ?Allergies as of 08/31/2021   ? ?   Reactions  ? Benzonatate Nausea And Vomiting  ? "tessalon pearl"  ? Sulfonamide Derivatives Itching, Rash  ? Fosamax [alendronate]   ? Hctz [hydrochlorothiazide] Other (See Comments)  ? Severe hyponatremia   ? Lisinopril Cough  ? ?  ? ?  ?Medication List  ?  ? ?  ? Accurate as of Aug 31, 2021  2:31 PM. If you have any questions, ask your nurse or doctor.  ?  ?  ? ?  ? ?albuterol 108 (90 Base) MCG/ACT inhaler ?Commonly known as: ProAir HFA ?Inhale 2 puffs into the lungs every 4 (four) hours as needed for wheezing or shortness of breath. ?  ?amLODipine 5 MG tablet ?Commonly known as: NORVASC ?Take 1 tablet (5 mg total) by mouth daily. ?  ?amoxicillin-clavulanate 875-125 MG tablet ?Commonly known as: AUGMENTIN ?Take 1 tablet by mouth 2 (two) times daily. ?  ?aspirin EC 81 MG tablet ?Take 81 mg by mouth daily. Swallow whole. ?  ?budesonide-formoterol 160-4.5 MCG/ACT inhaler ?Commonly known as: Symbicort ?Inhale 1 puff into the lungs 2 (two) times daily. ?  ?cholecalciferol 25 MCG (1000 UNIT) tablet ?Commonly known as: VITAMIN D3 ?Take 1,000 Units by mouth daily. ?  ?Cyanocobalamin 1000 MCG Lozg ?Take by mouth. ?  ?diclofenac sodium 1 %  Gel ?Commonly known as: Voltaren ?Apply 2 g topically as needed. ?  ?fexofenadine 180 MG tablet ?Commonly known as: Allegra Allergy ?Take 1 tablet (  180 mg total) by mouth daily. ?  ?HYDROcodone bit-homatropine 5-1.5 MG/5ML syrup ?Commonly known as: HYCODAN ?Take 5 mLs by mouth every 8 (eight) hours as needed for cough. ?  ?levothyroxine 75 MCG tablet ?Commonly known as: SYNTHROID ?Take 1 tablet (75 mcg total) by mouth daily before breakfast. ?  ?losartan 25 MG tablet ?Commonly known as: COZAAR ?Take 0.5 tablets (12.5 mg total) by mouth daily. ?  ?omeprazole 10 MG capsule ?Commonly known as: PRILOSEC ?Take 1 capsule (10 mg total) by mouth as needed. ?  ?simvastatin 10 MG tablet ?Commonly known as: ZOCOR ?Take 1 tablet (10 mg total) by mouth every evening. ?  ? ?  ? ? ?All past medical history, surgical history, allergies, family history, immunizations andmedications were updated in the EMR today and reviewed under the history and medication portions of their EMR.    ? ?ROS ?Negative, with the exception of above mentioned in HPI ? ? ?Objective:  ?BP (!) 149/75   Pulse 62   Temp 97.7 ?F (36.5 ?C)   Wt 146 lb 6.4 oz (66.4 kg)   SpO2 92%   BMI 27.66 kg/m?  ?Body mass index is 27.66 kg/m?Marland Kitchen ?Physical Exam ?Vitals and nursing note reviewed.  ?Constitutional:   ?   General: She is not in acute distress. ?   Appearance: Normal appearance. She is normal weight. She is not ill-appearing or toxic-appearing.  ?HENT:  ?   Head: Normocephalic and atraumatic.  ?   Right Ear: Tympanic membrane, ear canal and external ear normal.  ?   Left Ear: Tympanic membrane, ear canal and external ear normal.  ?   Nose: Congestion and rhinorrhea present.  ?   Mouth/Throat:  ?   Mouth: Mucous membranes are moist.  ?   Pharynx: Posterior oropharyngeal erythema present. No oropharyngeal exudate.  ?Eyes:  ?   General:     ?   Right eye: No discharge.     ?   Left eye: No discharge.  ?   Extraocular Movements: Extraocular movements intact.  ?    Conjunctiva/sclera: Conjunctivae normal.  ?   Pupils: Pupils are equal, round, and reactive to light.  ?Cardiovascular:  ?   Rate and Rhythm: Normal rate and regular rhythm.  ?Pulmonary:  ?   Effort: Pulmonary effort is norma

## 2021-09-07 ENCOUNTER — Ambulatory Visit (INDEPENDENT_AMBULATORY_CARE_PROVIDER_SITE_OTHER): Payer: Medicare Other | Admitting: Cardiology

## 2021-09-07 ENCOUNTER — Encounter: Payer: Self-pay | Admitting: Cardiology

## 2021-09-07 VITALS — BP 124/72 | HR 51 | Ht 61.0 in | Wt 145.0 lb

## 2021-09-07 DIAGNOSIS — E785 Hyperlipidemia, unspecified: Secondary | ICD-10-CM

## 2021-09-07 DIAGNOSIS — I471 Supraventricular tachycardia: Secondary | ICD-10-CM

## 2021-09-07 DIAGNOSIS — I10 Essential (primary) hypertension: Secondary | ICD-10-CM | POA: Diagnosis not present

## 2021-09-07 DIAGNOSIS — I679 Cerebrovascular disease, unspecified: Secondary | ICD-10-CM | POA: Diagnosis not present

## 2021-09-07 NOTE — Patient Instructions (Signed)

## 2021-09-20 ENCOUNTER — Ambulatory Visit
Admission: RE | Admit: 2021-09-20 | Discharge: 2021-09-20 | Disposition: A | Discharge status: Home / Self Care | Payer: Medicare Other | Source: Ambulatory Visit | Attending: Family Medicine | Admitting: Family Medicine

## 2021-09-20 DIAGNOSIS — Z1231 Encounter for screening mammogram for malignant neoplasm of breast: Secondary | ICD-10-CM

## 2021-10-17 ENCOUNTER — Encounter: Payer: Self-pay | Admitting: Family Medicine

## 2021-10-24 DIAGNOSIS — H35371 Puckering of macula, right eye: Secondary | ICD-10-CM | POA: Diagnosis not present

## 2021-10-24 DIAGNOSIS — H5213 Myopia, bilateral: Secondary | ICD-10-CM | POA: Diagnosis not present

## 2021-10-24 DIAGNOSIS — H02834 Dermatochalasis of left upper eyelid: Secondary | ICD-10-CM | POA: Diagnosis not present

## 2021-10-24 DIAGNOSIS — H02831 Dermatochalasis of right upper eyelid: Secondary | ICD-10-CM | POA: Diagnosis not present

## 2021-11-23 NOTE — Patient Instructions (Signed)

## 2021-11-23 NOTE — Progress Notes (Unsigned)
Subjective:   Amy Rodriguez is a 88 y.o. female who presents for Medicare Annual (Subsequent) preventive examination.  I connected with  [name] on 11/26/21 by an audio only telemedicine application and verified that I am speaking with the correct person using two identifiers.   I discussed the limitations, risks, security and privacy concerns of performing an evaluation and management service by telephone and the availability of in person appointments. I also discussed with the patient that there may be a patient responsible charge related to this service. The patient expressed understanding and verbally consented to this telephonic visit.  Location of Patient: Home Location of Provider: Office  List any persons and their role that are participating in the visit with the patient.   [place], [place]  Review of Systems    Defer to PCP Cardiac Risk Factors include: advanced age (>60mn, >>54women)     Objective:    There were no vitals filed for this visit. There is no height or weight on file to calculate BMI.     11/26/2021   10:52 AM 08/14/2017    2:06 PM 08/07/2016    2:06 PM 10/07/2015    1:47 PM 09/21/2015   10:58 AM 08/03/2015   10:40 AM 08/03/2015   10:31 AM  Advanced Directives  Does Patient Have a Medical Advance Directive? No Yes Yes No No  Yes  Type of ACorporate treasurerof [place]ittery PointLiving will Living will    [place] will  Copy of [place]?  No - copy requested    No - copy requested Yes  Would patient like information on creating a medical advance directive? No - Patient declined    No - patient declined information      Current Medications (verified) Outpatient Encounter Medications as of 11/26/2021  Medication Sig   albuterol (PROAIR HFA) 108 (90 Base) MCG/ACT inhaler Inhale 2 puffs into the lungs every 4 (four) hours as needed for wheezing or shortness of breath.    amLODipine (NORVASC) 5 MG tablet Take 1 tablet (5 mg total) by mouth daily.   amoxicillin-clavulanate (AUGMENTIN) 875-125 MG tablet Take 1 tablet by mouth 2 (two) times daily.   aspirin EC 81 MG tablet Take 81 mg by mouth daily. Swallow whole.   budesonide-formoterol (SYMBICORT) 160-4.5 MCG/ACT inhaler Inhale 1 puff into the lungs 2 (two) times daily.   cholecalciferol (VITAMIN D3) 25 MCG (1000 UNIT) tablet Take 1,000 Units by mouth daily.   Cyanocobalamin 1000 MCG LOZG Take by mouth.   diclofenac sodium (VOLTAREN) 1 % GEL Apply 2 g topically as needed.   fexofenadine (ALLEGRA ALLERGY) 180 MG tablet Take 1 tablet (180 mg total) by mouth daily.   HYDROcodone bit-homatropine (HYCODAN) 5-1.5 MG/5ML syrup Take 5 mLs by mouth every 8 (eight) hours as needed for cough.   levothyroxine (SYNTHROID) 75 MCG tablet Take 1 tablet (75 mcg total) by mouth daily before breakfast.   losartan (COZAAR) 25 MG tablet Take 0.5 tablets (12.5 mg total) by mouth daily.   omeprazole (PRILOSEC) 10 MG capsule Take 1 capsule (10 mg total) by mouth as needed.   simvastatin (ZOCOR) 10 MG tablet Take 1 tablet (10 mg total) by mouth every evening.   No facility-administered encounter medications on file as of 11/26/2021.    Allergies (verified) Benzonatate, Sulfonamide derivatives, Fosamax [alendronate], Hctz [hydrochlorothiazide], and Lisinopril   History: Past Medical History:  Diagnosis Date  Arthritis    sed rate 10, RF, CCP pending   Blood in stool    Cervical cancer (HCC)    s/p hysterectomy/oop   DDD (degenerative disc disease), lumbosacral    Depression    Due to husbands passing away 09/22/2011   Diverticulosis of colon    Environmental allergies    GERD (gastroesophageal reflux disease)    H pylori ulcer    not treated due to expense  10/2001   Hand dermatitis    HTN (hypertension)    Hyperlipidemia    Hypothyroidism    TSH 14.488 (11/2005)   IBS (irritable bowel syndrome)    Lactose intolerance     Migraines    "get them very rarely now" (02/08/2012)   Multiple pigmented nevi    last derm evaluation 01/2003   OA (osteoarthritis)    multiple sites   Other seborrheic keratosis    Pain in joint, lower leg    Pneumonia    "couple times in my lifetime" (02/08/2012)   PONV (postoperative nausea and vomiting)    "and takes me a long time to come out under it" (02/08/2012)   Posterior vitreous detachment 1996   Postmenopausal    s/p hysterectomy for h/o cervical cancer 1982 (both ovaries taken at that time) now on hormonal replacement    Postmenopausal HRT (hormone replacement therapy)    PPD positive    history +PPD 1984, no treatment, no abnormal CXR   Shortness of breath    with ambulation   Stroke [facility])    TIA- April 2016 last one   Supraventricular tachycardia, paroxysmal ([facility])    Urinary frequency    Vaginal pruritus    Past Surgical History:  Procedure Laterality Date   ABDOMINAL HYSTERECTOMY  1982   CARDIOVASCULAR STRESS TEST  12/07/11   Normal nuclear stress test   CATARACT EXTRACTION W/ INTRAOCULAR LENS IMPLANT  ?1997   right   COLONOSCOPY     EYE SURGERY     cataract removal right eye   TOTAL ABDOMINAL HYSTERECTOMY W/ BILATERAL SALPINGOOPHORECTOMY  1982   TOTAL KNEE ARTHROPLASTY  02/08/2012   Procedure: TOTAL KNEE ARTHROPLASTY;  Surgeon: Hessie Dibble, MD;  Location: [facility];  Service: Orthopedics;  Laterality: Left;   Family History  Problem Relation Age of Onset   Breast cancer Mother 36   Lung cancer Brother 78   Cancer Brother        lung   Heart failure Father        congestive   Heart disease Father    Epilepsy Son    Kidney disease Son        tuberous sclerosis, both kidneys removed, has transplant   Diabetes Maternal Uncle    Diabetes Paternal Aunt    Cancer Maternal Grandmother        breast   Breast cancer Maternal Grandmother    Heart disease Son        [place]   COPD Son    Rectal cancer Maternal Aunt    Colon cancer Neg Hx     Esophageal cancer Neg Hx    Stomach cancer Neg Hx    Social History   Socioeconomic History   Marital status: Widowed    Spouse name: Not on file   Number of children: 3   Years of education: 12TH   Highest education level: Not on file  Occupational History   Occupation: retired    Comment: Academic librarian: RETIRED  Tobacco Use   Smoking status: Former    Packs/day: 1.00    Years: 25.00    Total pack years: 25.00    Types: Cigarettes    Quit date: 04/17/1980    Years since quitting: 41.6   Smokeless tobacco: Never   Tobacco comments:    passive smoker, husband smoked  Vaping Use   Vaping Use: Never used  Substance and Sexual Activity   Alcohol use: No   Drug use: No   Sexual activity: Never  Other Topics Concern   Not on file  Social History Narrative   Lives with son, [name] who has medical problems-they help each other. Her husband died October 07, 2022 after 53 years of marriage.  She has 2 other sons that live within 2 hrs from her. She does not drive-she uses [facility]. She used to work in [employer], research (medical), retired.     Tobacco: 25 pack yr hx, quit 1985.   Social Determinants of Health   Financial Resource Strain: Low Risk  (11/26/2021)   Overall Financial Resource Strain (CARDIA)    Difficulty of Paying Living Expenses: Not hard at all  Food Insecurity: No Food Insecurity (11/24/2020)   Hunger Vital Sign    Worried About Running Out of Food in the Last Year: Never true    Ran Out of Food in the Last Year: Never true  Transportation Needs: No Transportation Needs (11/26/2021)   PRAPARE - Hydrologist (Medical): No    Lack of Transportation (Non-Medical): No  Physical Activity: Inactive (11/26/2021)   Exercise Vital Sign    Days of Exercise per Week: 0 days    Minutes of Exercise per Session: 0 min  Stress: No Stress Concern Present (11/26/2021)   [place]    Feeling of Stress  : Not at all  Social Connections: Moderately Isolated (11/26/2021)   Social Connection and Isolation Panel [NHANES]    Frequency of Communication with Friends and Family: More than three times a week    Frequency of Social Gatherings with Friends and Family: Three times a week    Attends Religious Services: More than 4 times per year    Active Member of Clubs or Organizations: No    Attends Archivist Meetings: Never    Marital Status: Widowed    Tobacco Counseling Counseling given: Not Answered Tobacco comments: passive smoker, husband smoked   Clinical Intake:  Pre-visit preparation completed: Yes  Pain : No/denies pain     Nutritional Status: BMI 25 -29 Overweight Nutritional Risks: None Diabetes: No  How often do you need to have someone help you when you read instructions, pamphlets, or other written materials from your doctor or pharmacy?: 1 - Never  Diabetic?no  Interpreter Needed?: No      Activities of Daily Living    11/26/2021   10:52 AM  In your present state of health, do you have any difficulty performing the following activities:  Hearing? 1  Vision? 0  Difficulty concentrating or making decisions? 0  Walking or climbing stairs? 1  Dressing or bathing? 0  Doing errands, shopping? 0  Preparing Food and eating ? N  Using the Toilet? N  In the past six months, have you accidently leaked urine? N  Do you have problems with loss of bowel control? N  Managing your Medications? N  Managing your Finances? N  Housekeeping or managing your Housekeeping? N    Patient  Care Team: Ma Hillock, DO as PCP - General (Family Medicine) Shon Hough, MD as Consulting Physician (Ophthalmology) Lamonte Sakai Rose Fillers, MD as Consulting Physician (Pulmonary Disease) Stanford Breed Denice Bors, MD as Consulting Physician (Cardiology) Garvin Fila, MD as Consulting Physician (Neurology) Inda Castle, MD (Inactive) as Consulting Physician  (Gastroenterology) Philemon Kingdom, MD as Consulting Physician (Internal Medicine) Melrose Nakayama, MD as Consulting Physician (Orthopedic Surgery)  Indicate any recent [place] you may have received from other than [facility] providers in the past year (date may be approximate).     Assessment:   This is a routine wellness examination for [place].  Hearing/Vision screen No results found.  Dietary issues and exercise activities discussed: Current Exercise Habits: The patient does not participate in regular exercise at present, Exercise limited by: None identified   Goals Addressed   None    Depression Screen    11/26/2021   10:50 AM 07/06/2021   10:13 AM 01/19/2021    1:45 PM 03/23/2020    1:56 PM 09/27/2018    1:30 PM 08/14/2017    2:07 PM 08/07/2016    2:06 PM  PHQ 2/9 Scores  PHQ - 2 Score 0 0 0 0 0 0 0    Fall Risk    11/26/2021   11:00 AM 07/06/2021   10:28 AM 01/19/2021    1:44 PM 03/23/2020    1:56 PM 09/27/2018    1:30 PM  Fall Risk   Falls in the past year? 0 0 0 0 0  Number falls in past yr: 0 0 0 0 0  Injury with Fall? 0 0 0 0 0  Risk for fall due to :  No Fall Risks     Follow up Falls evaluation completed Falls evaluation completed Falls evaluation completed Falls evaluation completed Falls evaluation completed    [place]:  Any stairs in or around the home? Yes  If so, are there any without handrails? Yes  Home free of loose throw rugs in walkways, pet beds, electrical cords, etc? Yes  Adequate lighting in your home to reduce risk of falls? Yes   ASSISTIVE DEVICES UTILIZED TO PREVENT FALLS:  Life alert? No  Use of a cane, walker or w/c?  occassionally Grab bars in the bathroom? No  Shower chair or bench in shower? No  Elevated toilet seat or a handicapped toilet? No   TIMED UP AND GO:  Was the test performed?  n/a .  Length of time to ambulate 10 feet: n/a sec.    Cognitive Function:    11/26/2021   11:04  AM 08/14/2017    2:08 PM 08/07/2016    2:07 PM  MMSE - Mini Mental State Exam  Not completed: Unable to complete    Orientation to time  5 5  Orientation to Place  5 5  Registration  3 3  Attention/ Calculation  5 5  Recall  3 3  Language- name 2 objects  2 2  Language- repeat  1 1  Language- follow 3 step command  3 3  Language- read & follow direction  1 1  Write a sentence  1 1  Copy design  0 1  Total score  29 30        11/26/2021   10:55 AM  6CIT Screen  What Year? 0 points  What month? 0 points  What time? 0 points  Count back from 20 0 points  Months in reverse  0 points  Repeat phrase 0 points  Total Score 0 points    Immunizations Immunization History  Administered Date(s) Administered   Fluad Quad(high Dose 65+) 02/06/2019, 02/10/2020, 01/19/2021   Influenza Split 01/12/2011, 02/09/2012   Influenza Whole 02/08/2007, 01/24/2008, 12/16/2008, 01/27/2009, 02/17/2010   Influenza, High Dose Seasonal PF 01/16/2017, 01/25/2018   Influenza,inj,Quad PF,6+ Mos 12/17/2012, 12/19/2013, 01/08/2015, 01/06/2016   PFIZER(Purple Top)SARS-COV-2 Vaccination 07/02/2019, 07/23/2019, 03/23/2020   Pneumococcal Conjugate-13 07/08/2014   Pneumococcal Polysaccharide-23 05/23/2004   Td 06/16/2002   Tdap 12/17/2012   Zoster, Live 12/17/2012    TDAP status: Up to date  Flu Vaccine status: Up to date  Pneumococcal vaccine status: Up to date  Covid-19 vaccine status: Completed vaccines  Qualifies for Shingles Vaccine? Yes   Zostavax completed No   Shingrix Completed?: No.    Education has been provided regarding the importance of this vaccine. Patient has been advised to call insurance company to determine out of pocket expense if they have not yet received this vaccine. Advised may also receive vaccine at local pharmacy or Health Dept. Verbalized acceptance and understanding.  Screening Tests Health Maintenance  Topic Date Due   Zoster Vaccines- Shingrix (1 of 2) Never done    COVID-19 Vaccine (4 - Pfizer series) 05/18/2020   INFLUENZA VACCINE  11/15/2021   DEXA SCAN  09/18/2022   MAMMOGRAM  09/21/2022   TETANUS/TDAP  12/18/2022   Pneumonia Vaccine 12+ Years old  Completed   HPV VACCINES  Aged Out    Health Maintenance  Health Maintenance Due  Topic Date Due   Zoster Vaccines- Shingrix (1 of 2) Never done   COVID-19 Vaccine (4 - Pfizer series) 05/18/2020   INFLUENZA VACCINE  11/15/2021    Colorectal cancer screening: No longer required.   Mammogram status: Completed 09/20/2021. Repeat every year  Bone Density status: Completed 09/17/2020. Results reflect: Bone density results: OSTEOPOROSIS. Repeat every 2 years.  Lung Cancer Screening: (Low Dose CT Chest recommended if Age 56-80 years, 30 pack-year currently smoking OR have quit w/in 15years.) does not qualify.   Lung Cancer Screening Referral: n/a  Additional Screening:  Hepatitis C Screening: does not qualify; Completed n/a  Vision Screening: Recommended annual ophthalmology exams for early detection of glaucoma and other disorders of the eye. Is the patient up to date with their annual eye exam?  Yes  Who is the provider or what is the name of the office in which the patient attends annual eye exams? [facility] If pt is not established with a provider, would they like to be referred to a provider to establish care? No .   Dental Screening: Recommended annual dental exams for proper oral hygiene  Community Resource Referral / Chronic Care Management: CRR required this visit?  No   CCM required this visit?  No      Plan:     I have personally reviewed and noted the following in the patient's chart:   Medical and social history Use of alcohol, tobacco or illicit drugs  Current medications and supplements including opioid prescriptions.  Functional ability and status Nutritional status Physical activity Advanced directives List of other physicians Hospitalizations,  surgeries, and ER visits in previous 12 months Vitals Screenings to include cognitive, depression, and falls Referrals and appointments  In addition, I have reviewed and discussed with patient certain preventive protocols, quality metrics, and best practice recommendations. A written personalized care plan for preventive services as well as general preventive health recommendations were provided to patient.  Octaviano Glow, CMA   11/26/2021   Nurse Notes: Non-Face to Face or Face to Face 10 minute visit Encounter  Ms. Stark , Thank you for taking time to come for your Medicare Wellness Visit. I appreciate your ongoing commitment to your health goals. Please review the following plan we discussed and let me know if I can assist you in the future.   These are the goals we discussed:  Goals      Increase physical activity     Increase activity.      sleep     Increase amount of sleep nightly, will start by decreasing coffee intake after 7 pm.         This is a list of the screening recommended for you and due dates:  Health Maintenance  Topic Date Due   Zoster (Shingles) Vaccine (1 of 2) Never done   COVID-19 Vaccine (4 - Pfizer series) 05/18/2020   Flu Shot  11/15/2021   DEXA scan (bone density measurement)  09/18/2022   Mammogram  09/21/2022   Tetanus Vaccine  12/18/2022   Pneumonia Vaccine  Completed   HPV Vaccine  Aged Out

## 2021-11-26 ENCOUNTER — Ambulatory Visit (INDEPENDENT_AMBULATORY_CARE_PROVIDER_SITE_OTHER): Payer: Medicare Other

## 2021-11-26 DIAGNOSIS — Z0000 Encounter for general adult medical examination without abnormal findings: Secondary | ICD-10-CM | POA: Diagnosis not present

## 2022-03-16 ENCOUNTER — Other Ambulatory Visit: Payer: Self-pay

## 2022-03-16 MED ORDER — LOSARTAN POTASSIUM 25 MG PO TABS: 12.5000 mg | ORAL_TABLET | Freq: Every day | ORAL | 0 refills | Status: DC

## 2022-03-28 IMAGING — US US SOFT TISSUE HEAD/NECK
1 series · 14 of 18 positions shown · non-contrast
Comparison: None.

CLINICAL DATA: 86-year-old female with palpable abnormality in the
left submandibular space

EXAM:
ULTRASOUND OF HEAD/NECK SOFT TISSUES
TECHNIQUE: Ultrasound examination of the head and neck soft tissues was
performed in the area of clinical concern.

[Series 1: us soft tissue head/neck · 0.06mm/px · 14 of 18 slices shown]
[im 1/18]
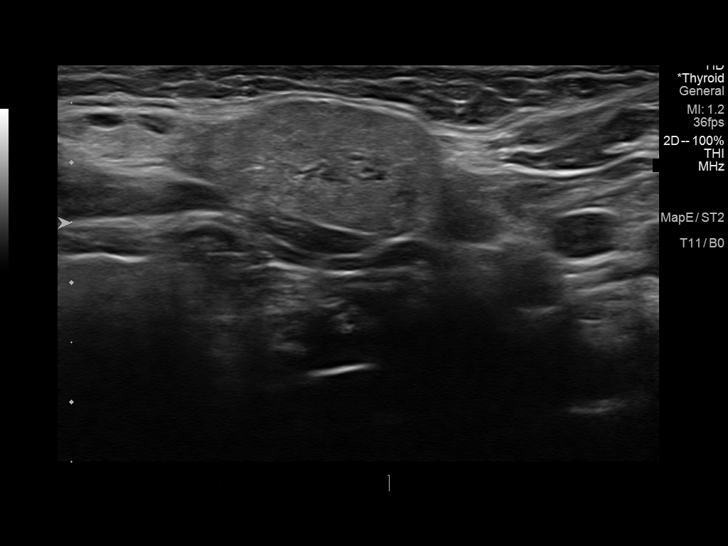
[im 2/18]
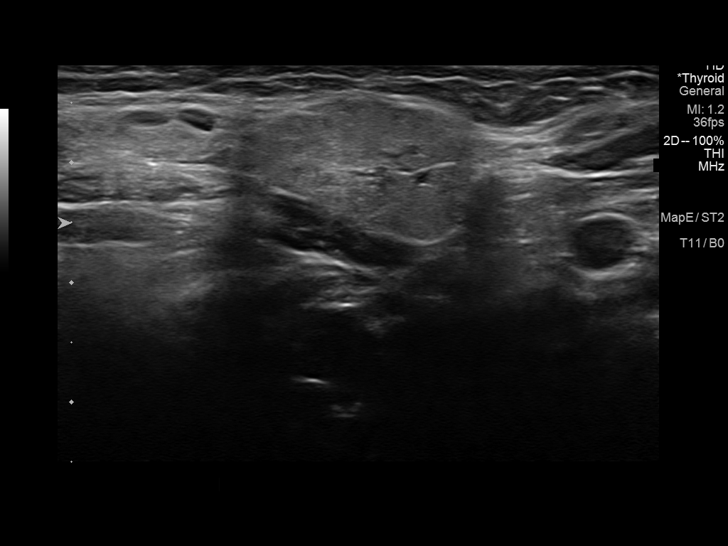
[im 4/18]
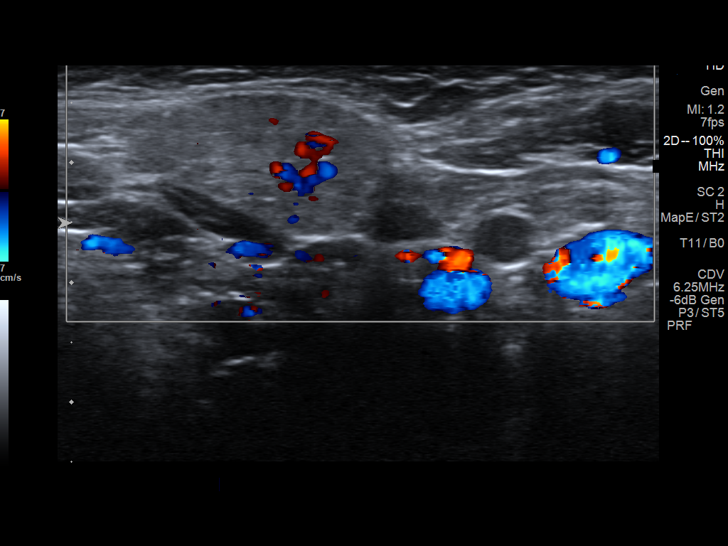
[im 5/18]
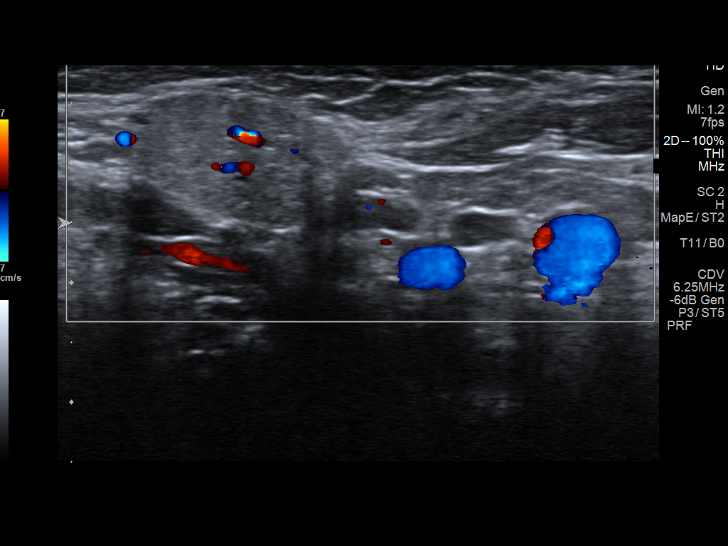
[im 6/18]
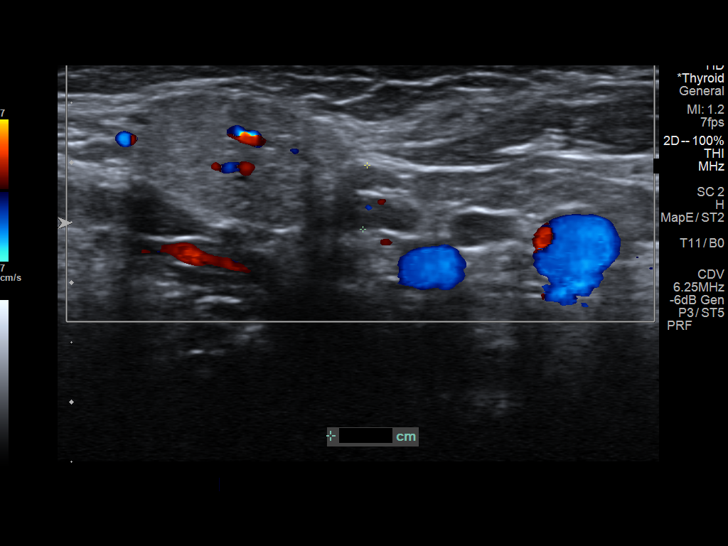
[im 8/18]
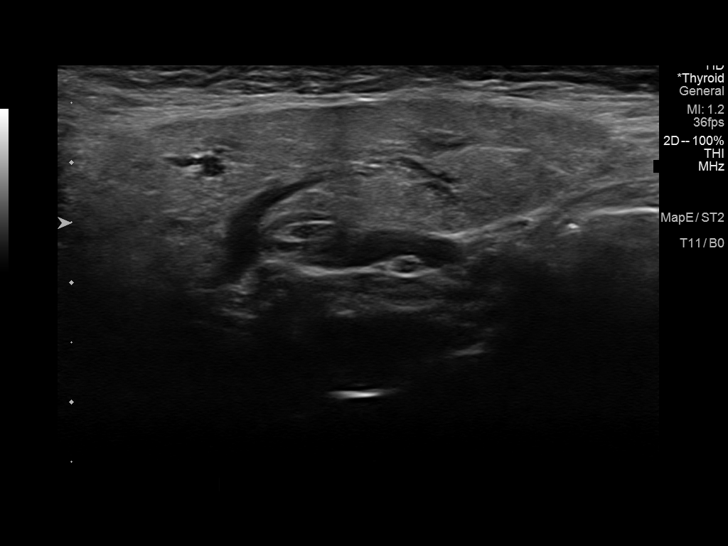
[im 9/18]
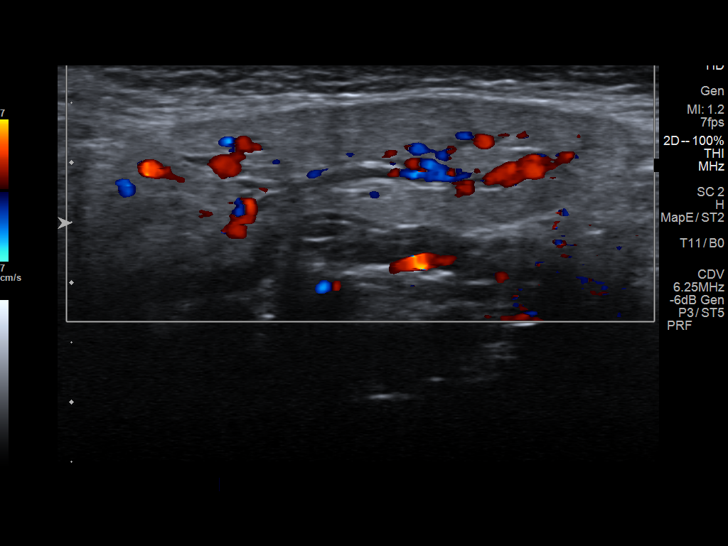
[im 10/18]
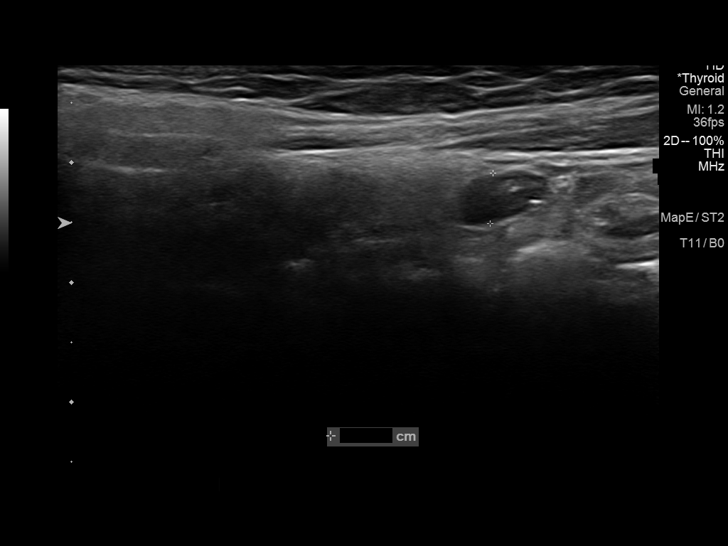
[im 11/18]
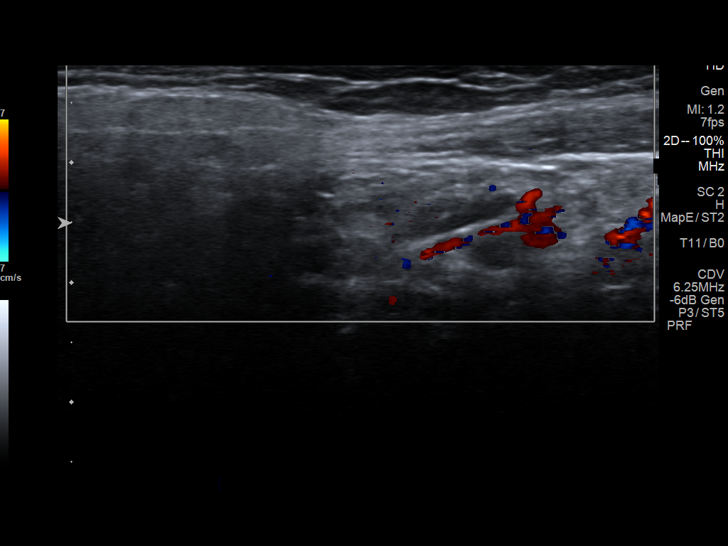
[im 13/18]
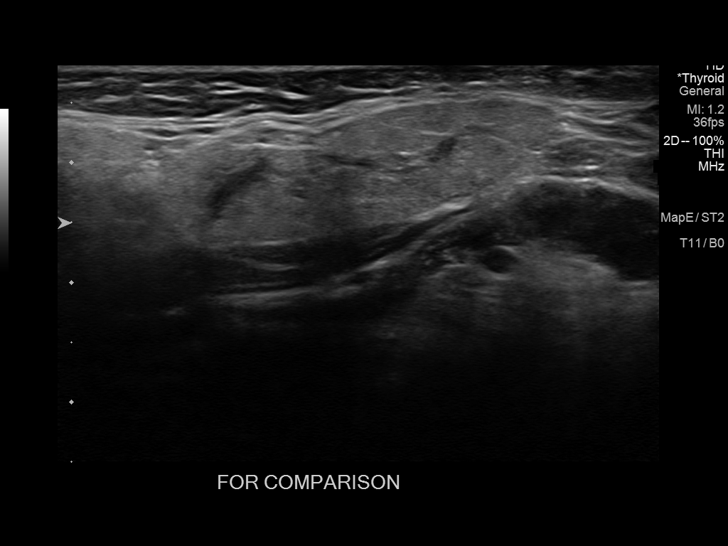
[im 14/18]
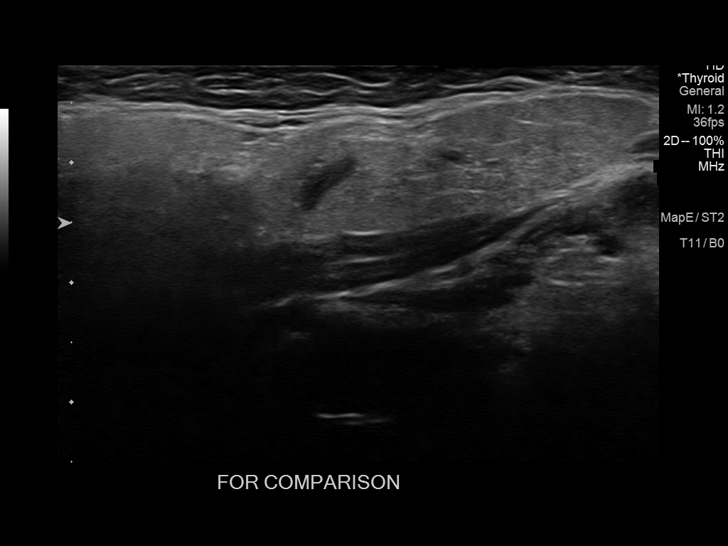
[im 15/18]
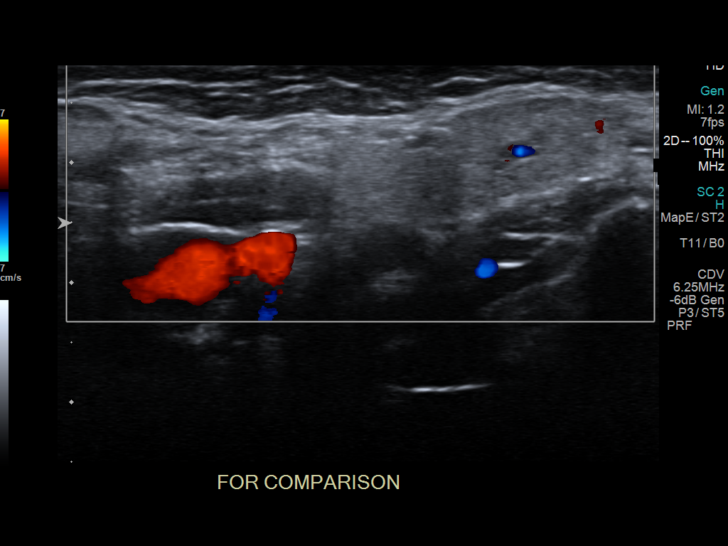
[im 17/18]
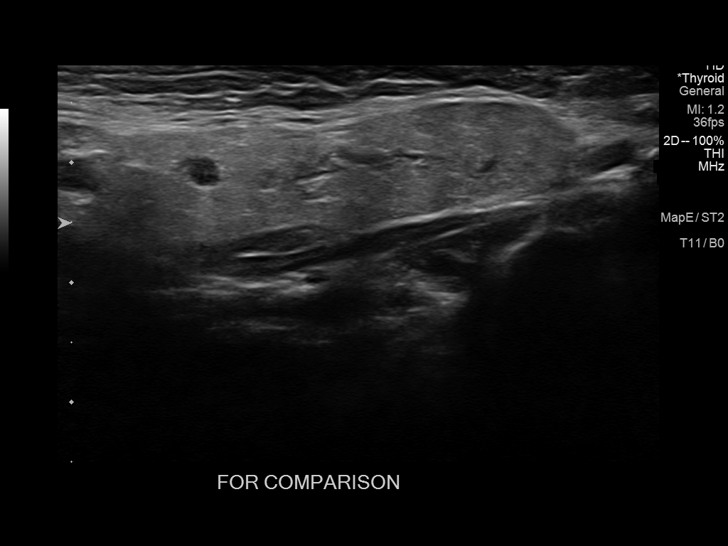
[im 18/18]
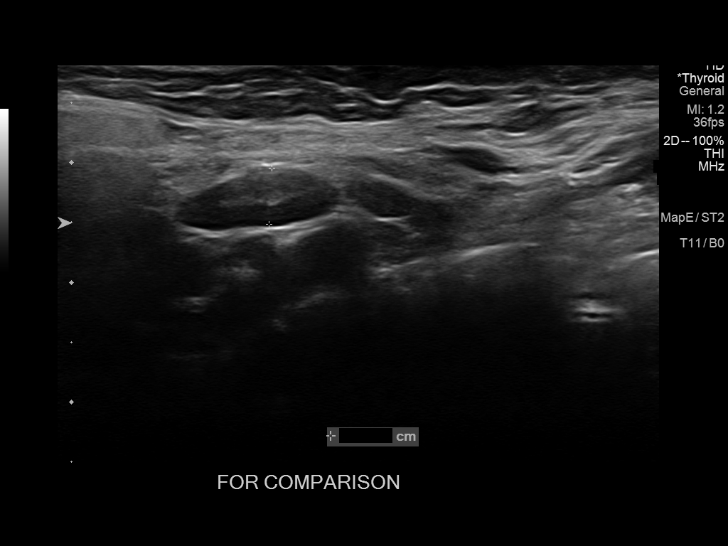

[14 of 18 positions shown; findings below may reference images not displayed]

FINDINGS: Sonographic interrogation of the region of clinical concern
demonstrates a normal submandibular gland and an unremarkable
appearing submandibular station lymph node which is not enlarged by
imaging criteria. Additional imaging performed of the right
submandibular space for comparison. Similar appearing right
submandibular gland and associated submandibular station lymph node.

No abnormal lymphadenopathy, mass or cystic lesion identified.
IMPRESSION: Negative sonographic survey of the region of clinical concern. The
palpable abnormality appears to correspond with the normal
submandibular gland.

## 2022-06-17 ENCOUNTER — Other Ambulatory Visit: Payer: Self-pay | Admitting: Family Medicine

## 2022-07-09 ENCOUNTER — Other Ambulatory Visit: Payer: Self-pay | Admitting: Family Medicine

## 2022-07-16 ENCOUNTER — Other Ambulatory Visit: Payer: Self-pay | Admitting: Family Medicine

## 2022-07-17 NOTE — Telephone Encounter (Signed)
Patient has CPE appt scheduled with Dr. Raoul Pitch on 07/28/22

## 2022-07-18 ENCOUNTER — Other Ambulatory Visit: Payer: Self-pay | Admitting: Family Medicine

## 2022-07-24 ENCOUNTER — Telehealth: Payer: Self-pay | Admitting: Family Medicine

## 2022-07-24 MED ORDER — SIMVASTATIN 10 MG PO TABS: 10.0000 mg | ORAL_TABLET | Freq: Every evening | ORAL | 0 refills | Status: DC

## 2022-07-24 NOTE — Telephone Encounter (Signed)
Pt is requesting temp medication refill on simvastatin (ZOCOR) 10 MG tablet  until her appointment this coming Friday. Pharmacy is confirmed and correct,

## 2022-07-28 ENCOUNTER — Ambulatory Visit (INDEPENDENT_AMBULATORY_CARE_PROVIDER_SITE_OTHER): Payer: 59 | Admitting: Family Medicine

## 2022-07-28 ENCOUNTER — Encounter: Payer: Self-pay | Admitting: Family Medicine

## 2022-07-28 VITALS — BP 128/69 | HR 55 | Temp 97.5°F | Ht 60.63 in | Wt 147.6 lb

## 2022-07-28 DIAGNOSIS — E063 Autoimmune thyroiditis: Secondary | ICD-10-CM

## 2022-07-28 DIAGNOSIS — M5137 Other intervertebral disc degeneration, lumbosacral region: Secondary | ICD-10-CM | POA: Diagnosis not present

## 2022-07-28 DIAGNOSIS — I679 Cerebrovascular disease, unspecified: Secondary | ICD-10-CM

## 2022-07-28 DIAGNOSIS — I471 Supraventricular tachycardia, unspecified: Secondary | ICD-10-CM

## 2022-07-28 DIAGNOSIS — M51379 Other intervertebral disc degeneration, lumbosacral region without mention of lumbar back pain or lower extremity pain: Secondary | ICD-10-CM

## 2022-07-28 DIAGNOSIS — E538 Deficiency of other specified B group vitamins: Secondary | ICD-10-CM

## 2022-07-28 DIAGNOSIS — I10 Essential (primary) hypertension: Secondary | ICD-10-CM

## 2022-07-28 DIAGNOSIS — Z0000 Encounter for general adult medical examination without abnormal findings: Secondary | ICD-10-CM | POA: Diagnosis not present

## 2022-07-28 DIAGNOSIS — E038 Other specified hypothyroidism: Secondary | ICD-10-CM

## 2022-07-28 DIAGNOSIS — Z1231 Encounter for screening mammogram for malignant neoplasm of breast: Secondary | ICD-10-CM

## 2022-07-28 DIAGNOSIS — E782 Mixed hyperlipidemia: Secondary | ICD-10-CM

## 2022-07-28 DIAGNOSIS — M816 Localized osteoporosis [Lequesne]: Secondary | ICD-10-CM | POA: Diagnosis not present

## 2022-07-28 DIAGNOSIS — I729 Aneurysm of unspecified site: Secondary | ICD-10-CM | POA: Diagnosis not present

## 2022-07-28 DIAGNOSIS — R7303 Prediabetes: Secondary | ICD-10-CM

## 2022-07-28 DIAGNOSIS — G459 Transient cerebral ischemic attack, unspecified: Secondary | ICD-10-CM | POA: Diagnosis not present

## 2022-07-28 DIAGNOSIS — E559 Vitamin D deficiency, unspecified: Secondary | ICD-10-CM

## 2022-07-28 MED ORDER — AMLODIPINE BESYLATE 5 MG PO TABS: 5.0000 mg | ORAL_TABLET | Freq: Every day | ORAL | 1 refills | Status: DC

## 2022-07-28 MED ORDER — SIMVASTATIN 10 MG PO TABS: 10.0000 mg | ORAL_TABLET | Freq: Every evening | ORAL | 3 refills | Status: DC

## 2022-07-28 MED ORDER — FEXOFENADINE HCL 180 MG PO TABS: 180.0000 mg | ORAL_TABLET | Freq: Every day | ORAL | 3 refills | Status: DC

## 2022-07-28 MED ORDER — BUDESONIDE-FORMOTEROL FUMARATE 160-4.5 MCG/ACT IN AERO: 1.0000 | INHALATION_SPRAY | Freq: Two times a day (BID) | RESPIRATORY_TRACT | 11 refills | Status: DC

## 2022-07-28 MED ORDER — ALBUTEROL SULFATE HFA 108 (90 BASE) MCG/ACT IN AERS: 2.0000 | INHALATION_SPRAY | RESPIRATORY_TRACT | 3 refills | Status: DC | PRN

## 2022-07-28 MED ORDER — LOSARTAN POTASSIUM 25 MG PO TABS: 12.5000 mg | ORAL_TABLET | Freq: Every day | ORAL | 1 refills | Status: DC

## 2022-07-28 NOTE — Progress Notes (Signed)
Amy Rodriguez , [birth year], 89 y.o., female MRN: [id number] Patient Care Team    Relationship Specialty Notifications Start End  Natalia Leatherwood, DO PCP - General Family Medicine  01/08/15   Mckinley Jewel, MD Consulting Physician Ophthalmology  08/03/15   Leslye Peer, MD Consulting Physician Pulmonary Disease  08/03/15   Lewayne Bunting, MD Consulting Physician Cardiology  08/03/15   Micki Riley, MD Consulting Physician Neurology  08/03/15   Louis Meckel, MD (Inactive) Consulting Physician Gastroenterology  08/04/15   Carlus Pavlov, MD Consulting Physician Internal Medicine  08/07/16   Marcene Corning, MD Consulting Physician Orthopedic Surgery  08/07/16     Chief Complaint  Patient presents with   Annual Exam    [facility]; pt is not fasting    Subjective: Amy Rodriguez is a 89 y.o. female present for CPE/CMC follow up.   Health maintenance:  Mammogram: completed:09/2021, bc-gso> ordered Immunizations: tdap 9/ 2014 UTD- shei s aware of date to get at pharmacy, Influenza UTD (encouraged yearly), PNA series completed, zostavax completed, shingrix declined Infectious disease screening: completed Dexa: osteoporosis- UTD 09/2020- rpt 2 yrs (-2.9). declined prolia and SE to fosamax  Hypertension/hyperlipidemia/overweight/tachycardia/CAD: Patient reports compliance with amlodipine 5 mg daily, Zocor 10 mg every other day, and losartan 12.5 mg.   Patient does have a small MCA aneurysm, SVT, history of stroke/tia and is followed by cardiology.  She had been on atenolol low-dose and this was discontinued recently by cardiology.  She also been tried on lisinopril but noticed a tickle cough and changed to losartan.  Patient denies chest pain, shortness of breath, dizziness or lower extremity edema.  Baby aspirin- pt reports her heart doctor told her she did not need to continue   osteopenia, unspecified location Up-to-date 09/17/2020  B12 deficiency/ Vitamin D deficiency Patient  reports she has continued to supplementation. Aneurysm (HCC)/Apical variant hypertrophic cardiomyopathy (HCC) Follows with cardiology  Hypothyroid: Patient reports compliance with levothyroxine 75 g daily.         11/26/2021   10:50 AM 07/06/2021   10:13 AM 01/19/2021    1:45 PM 03/23/2020    1:56 PM 09/27/2018    1:30 PM  Depression screen PHQ 2/9  Decreased Interest 0 0 0 0 0  Down, Depressed, Hopeless 0 0 0  0  PHQ - 2 Score 0 0 0 0 0    Allergies  Allergen Reactions   Benzonatate Nausea And Vomiting    "tessalon pearl"   Sulfonamide Derivatives Itching and Rash   Fosamax [Alendronate]    Hctz [Hydrochlorothiazide] Other (See Comments)    Severe hyponatremia    Lisinopril Cough   Social History   Tobacco Use   Smoking status: Former    Packs/day: 1.00    Years: 25.00    Additional pack years: 0.00    Total pack years: 25.00    Types: Cigarettes    Quit date: 04/17/1980    Years since quitting: 42.3   Smokeless tobacco: Never   Tobacco comments:    passive smoker, husband smoked  Substance Use Topics   Alcohol use: No   Past Medical History:  Diagnosis Date   Arthritis    sed rate 10, RF, CCP pending   Blood in stool    Cervical cancer    s/p hysterectomy/oop   DDD (degenerative disc disease), lumbosacral    Depression    Due to husbands passing away 09/22/2011   Diverticulosis of colon    Environmental  allergies    GERD (gastroesophageal reflux disease)    H pylori ulcer    not treated due to expense  10/2001   Hand dermatitis    HTN (hypertension)    Hyperlipidemia    Hypothyroidism    TSH 14.488 (11/2005)   IBS (irritable bowel syndrome)    Lactose intolerance    Migraines    "get them very rarely now" (02/08/2012)   Multiple pigmented nevi    last derm evaluation 01/2003   OA (osteoarthritis)    multiple sites   Other seborrheic keratosis    Pain in joint, lower leg    Pneumonia    "couple times in my lifetime" (02/08/2012)   PONV  (postoperative nausea and vomiting)    "and takes me a long time to come out under it" (02/08/2012)   Posterior vitreous detachment 1996   Postmenopausal    s/p hysterectomy for h/o cervical cancer 1982 (both ovaries taken at that time) now on hormonal replacement    Postmenopausal HRT (hormone replacement therapy)    PPD positive    history +PPD 1984, no treatment, no abnormal CXR   Shortness of breath    with ambulation   Stroke    TIA- April 2016 last one   Supraventricular tachycardia, paroxysmal    Urinary frequency    Vaginal pruritus    Past Surgical History:  Procedure Laterality Date   ABDOMINAL HYSTERECTOMY  1982   CARDIOVASCULAR STRESS TEST  12/07/11   Normal nuclear stress test   CATARACT EXTRACTION W/ INTRAOCULAR LENS IMPLANT  ?1997   right   COLONOSCOPY     EYE SURGERY     cataract removal right eye   TOTAL ABDOMINAL HYSTERECTOMY W/ BILATERAL SALPINGOOPHORECTOMY  1982   TOTAL KNEE ARTHROPLASTY  02/08/2012   Procedure: TOTAL KNEE ARTHROPLASTY;  Surgeon: Velna Ochs, MD;  Location: [facility] OR;  Service: Orthopedics;  Laterality: Left;   Family History  Problem Relation Age of Onset   Breast cancer Mother 91   Lung cancer Brother 61   Cancer Brother        lung   Heart failure Father        congestive   Heart disease Father    Epilepsy Son    Kidney disease Son        tuberous sclerosis, both kidneys removed, has transplant   Diabetes Maternal Uncle    Diabetes Paternal Aunt    Cancer Maternal Grandmother        breast   Breast cancer Maternal Grandmother    Heart disease Son        [place]   COPD Son    Rectal cancer Maternal Aunt    Colon cancer Neg Hx    Esophageal cancer Neg Hx    Stomach cancer Neg Hx    Allergies as of 07/28/2022       Reactions   Benzonatate Nausea And Vomiting   "tessalon pearl"   Sulfonamide Derivatives Itching, Rash   Fosamax [alendronate]    Hctz [hydrochlorothiazide] Other (See Comments)   Severe hyponatremia     Lisinopril Cough        Medication List        Accurate as of July 28, 2022  1:13 PM. If you have any questions, ask your nurse or doctor.          STOP taking these medications    amoxicillin-clavulanate 875-125 MG tablet Commonly known as: AUGMENTIN Stopped by: Felix Pacini, DO  aspirin EC 81 MG tablet Stopped by: Felix Pacini, DO   HYDROcodone bit-homatropine 5-1.5 MG/5ML syrup Commonly known as: HYCODAN Stopped by: Felix Pacini, DO   omeprazole 10 MG capsule Commonly known as: PRILOSEC Stopped by: Felix Pacini, DO       TAKE these medications    albuterol 108 (90 Base) MCG/ACT inhaler Commonly known as: ProAir HFA Inhale 2 puffs into the lungs every 4 (four) hours as needed for wheezing or shortness of breath.   amLODipine 5 MG tablet Commonly known as: NORVASC Take 1 tablet (5 mg total) by mouth daily. What changed: additional instructions Changed by: Felix Pacini, DO   budesonide-formoterol 160-4.5 MCG/ACT inhaler Commonly known as: Symbicort Inhale 1 puff into the lungs 2 (two) times daily. What changed: additional instructions Changed by: Felix Pacini, DO   cholecalciferol 25 MCG (1000 UNIT) tablet Commonly known as: VITAMIN D3 Take 1,000 Units by mouth daily.   Cyanocobalamin 1000 MCG Lozg Take by mouth.   diclofenac sodium 1 % Gel Commonly known as: Voltaren Apply 2 g topically as needed.   fexofenadine 180 MG tablet Commonly known as: Allegra Allergy Take 1 tablet (180 mg total) by mouth daily.   levothyroxine 75 MCG tablet Commonly known as: SYNTHROID Take 1 tablet (75 mcg total) by mouth daily before breakfast. OFFICE VISIT NEEDED FOR FURTHER REFILLS   losartan 25 MG tablet Commonly known as: COZAAR Take 0.5 tablets (12.5 mg total) by mouth daily. What changed: additional instructions Changed by: Felix Pacini, DO   simvastatin 10 MG tablet Commonly known as: ZOCOR Take 1 tablet (10 mg total) by mouth every evening.         No results found for this or any previous visit (from the past 24 hour(s)).  No results found.   ROS: Negative, with the exception of above mentioned in HPI   Objective:  BP 128/69   Pulse (!) 55   Temp (!) 97.5 F (36.4 C)   Ht 5' 0.63" (1.54 m)   Wt 147 lb 9.6 oz (67 kg)   SpO2 94%   BMI 28.23 kg/m  Body mass index is 28.23 kg/m. Physical Exam Vitals and nursing note reviewed.  Constitutional:      General: She is not in acute distress.    Appearance: Normal appearance. She is not ill-appearing or toxic-appearing.  HENT:     Head: Normocephalic and atraumatic.     Right Ear: Tympanic membrane, ear canal and external ear normal. There is no impacted cerumen.     Left Ear: Tympanic membrane, ear canal and external ear normal. There is no impacted cerumen.     Nose: No congestion or rhinorrhea.     Mouth/Throat:     Mouth: Mucous membranes are moist.     Pharynx: Oropharynx is clear. No oropharyngeal exudate or posterior oropharyngeal erythema.  Eyes:     General: No scleral icterus.       Right eye: No discharge.        Left eye: No discharge.     Extraocular Movements: Extraocular movements intact.     Conjunctiva/sclera: Conjunctivae normal.     Pupils: Pupils are equal, round, and reactive to light.  Cardiovascular:     Rate and Rhythm: Normal rate and regular rhythm.     Pulses: Normal pulses.     Heart sounds: Normal heart sounds. No murmur heard.    No friction rub. No gallop.  Pulmonary:     Effort: Pulmonary effort is normal. No  respiratory distress.     Breath sounds: Normal breath sounds. No stridor. No wheezing, rhonchi or rales.  Chest:     Chest wall: No tenderness.  Abdominal:     General: Abdomen is flat. Bowel sounds are normal. There is no distension.     Palpations: Abdomen is soft. There is no mass.     Tenderness: There is no abdominal tenderness. There is no right CVA tenderness, left CVA tenderness, guarding or rebound.      Hernia: No hernia is present.  Musculoskeletal:        General: No swelling, tenderness or deformity. Normal range of motion.     Cervical back: Normal range of motion and neck supple. No rigidity or tenderness.     Right lower leg: No edema.     Left lower leg: No edema.  Lymphadenopathy:     Cervical: No cervical adenopathy.  Skin:    General: Skin is warm and dry.     Coloration: Skin is not jaundiced or pale.     Findings: No bruising, erythema, lesion or rash.  Neurological:     General: No focal deficit present.     Mental Status: She is alert and oriented to person, place, and time. Mental status is at baseline.     Cranial Nerves: No cranial nerve deficit.     Sensory: No sensory deficit.     Motor: No weakness.     Coordination: Coordination normal.     Gait: Gait normal.     Deep Tendon Reflexes: Reflexes normal.  Psychiatric:        Mood and Affect: Mood normal.        Behavior: Behavior normal.        Thought Content: Thought content normal.        Judgment: Judgment normal.     Assessment/Plan: Amy Rodriguez is a 89 y.o. female present for OV for follow-up on CPE/chronic medical conditions. Routine general medical examination at a health care facility Patient was encouraged to exercise greater than 150 minutes a week. Patient was encouraged to choose a diet filled with fresh fruits and vegetables, and lean meats. AVS provided to patient today for education/recommendation on gender specific health and safety maintenance. Colonoscopy: completed 2017 1 polyp, no further testing needed secondary to age. , by dr. Myrtie Neither Mammogram: completed:09/2020, bc-gso> ordered Immunizations: tdap 2014 UTD, Influenza UTD (encouraged yearly), PNA series completed, zostavax completed, covid UTD, shingrix script printed.  Infectious disease screening: completed Cervical cancer screening:> 65 n/i Dexa: osteoporosis- UTD 09/2020- rpt 2 yrs (-2.9). declined prolia and SE to  fosamax  SVT/TIA history/small MCA aneurysm/hypertension/hyperlipidemia/CVD/overweight Stable Continue amlodipine 5 mg daily. Continue losartan 12.5 mg daily. -Was on low-dose beta-blocker that has been discontinued secondary to decreased energy and low heart rate. Continue routine follow-ups with cardiology Labs: CBC, cmp, tsh, lipids -Continue simvastatin 10 mg QD  hypothyroidism TSH collected today -Continue levothyroxine 75 mcg daily. refills will be provided in appropriate dose based on lab result today   vitamin D deficiency/B12 deficiency: Currently taking vitamin D 1000 units and B12 sublingual 1000 Labs greatly improved.   Vitamin D and B12 levels collected today  Aneurysm (HCC)/Apical variant hypertrophic cardiomyopathy (HCC) Follows w/ cardio    Return in about 24 weeks (around 01/12/2023) for Routine chronic condition follow-up.   Reviewed expectations re: course of current medical issues. Discussed self-management of symptoms. Outlined signs and symptoms indicating need for more acute intervention. Patient verbalized understanding and all questions  were answered. Patient received an After-Visit Summary.    Orders Placed This Encounter  Procedures   MM 3D SCREENING MAMMOGRAM BILATERAL BREAST   DG Bone Density   CBC w/Diff   Comp Met (CMET)   TSH   Lipid panel   Hemoglobin A1c   Vitamin D (25 hydroxy)   B12   Meds ordered this encounter  Medications   amLODipine (NORVASC) 5 MG tablet    Sig: Take 1 tablet (5 mg total) by mouth daily.    Dispense:  90 tablet    Refill:  1   fexofenadine (ALLEGRA ALLERGY) 180 MG tablet    Sig: Take 1 tablet (180 mg total) by mouth daily.    Dispense:  90 tablet    Refill:  3   losartan (COZAAR) 25 MG tablet    Sig: Take 0.5 tablets (12.5 mg total) by mouth daily.    Dispense:  45 tablet    Refill:  1    If you could, would you please cut these in half for her. She has trouble doing it herself.   simvastatin  (ZOCOR) 10 MG tablet    Sig: Take 1 tablet (10 mg total) by mouth every evening.    Dispense:  45 tablet    Refill:  3   budesonide-formoterol (SYMBICORT) 160-4.5 MCG/ACT inhaler    Sig: Inhale 1 puff into the lungs 2 (two) times daily.    Dispense:  6 g    Refill:  11   albuterol (PROAIR HFA) 108 (90 Base) MCG/ACT inhaler    Sig: Inhale 2 puffs into the lungs every 4 (four) hours as needed for wheezing or shortness of breath.    Dispense:  1 each    Refill:  3     electronically signed by:  Felix Pacini, DO  [facility] - OR

## 2022-07-28 NOTE — Patient Instructions (Signed)
Return in about 24 weeks (around 01/12/2023) for Routine chronic condition follow-up.        Great to see you today.  I have refilled the medication(s) we provide.   If labs were collected, we will inform you of lab results once received either by echart message or telephone call.   - echart message- for normal results that have been seen by the patient already.   - telephone call: abnormal results or if patient has not viewed results in their echart.

## 2022-07-29 LAB — LIPID PANEL
Cholesterol: 128 mg/dL (ref <200)
HDL: 47 mg/dL — ABNORMAL LOW (ref >=50)
LDL Cholesterol (Calc): 59 mg/dL (calc)
Non-HDL Cholesterol (Calc): 81 mg/dL (calc) (ref <130)
Total CHOL/HDL Ratio: 2.7 (calc) (ref <5.0)
Triglycerides: 136 mg/dL (ref <150)

## 2022-07-29 LAB — VITAMIN D 25 HYDROXY (VIT D DEFICIENCY, FRACTURES): Vit D, 25-Hydroxy: 32 ng/mL (ref 30–100)

## 2022-07-29 LAB — CBC WITH DIFFERENTIAL/PLATELET
Absolute Monocytes: 889 cells/uL (ref 200–950)
Basophils Absolute: 29 cells/uL (ref 0–200)
Basophils Relative: 0.5 %
Eosinophils Absolute: 239 cells/uL (ref 15–500)
Eosinophils Relative: 4.2 %
HCT: 39.8 % (ref 35.0–45.0)
Hemoglobin: 13.4 g/dL (ref 11.7–15.5)
Lymphs Abs: 1334 cells/uL (ref 850–3900)
MCH: 28.4 pg (ref 27.0–33.0)
MCHC: 33.7 g/dL (ref 32.0–36.0)
MCV: 84.3 fL (ref 80.0–100.0)
MPV: 11 fL (ref 7.5–12.5)
Monocytes Relative: 15.6 %
Neutro Abs: 3209 cells/uL (ref 1500–7800)
Neutrophils Relative %: 56.3 %
Platelets: 224 10*3/uL (ref 140–400)
RBC: 4.72 10*6/uL (ref 3.80–5.10)
RDW: 12.3 % (ref 11.0–15.0)
Total Lymphocyte: 23.4 %
WBC: 5.7 10*3/uL (ref 3.8–10.8)

## 2022-07-29 LAB — COMPREHENSIVE METABOLIC PANEL
AG Ratio: 1.9 (calc) (ref 1.0–2.5)
ALT: 7 U/L (ref 6–29)
AST: 15 U/L (ref 10–35)
Albumin: 4.3 g/dL (ref 3.6–5.1)
Alkaline phosphatase (APISO): 80 U/L (ref 37–153)
BUN: 8 mg/dL (ref 7–25)
CO2: 23 mmol/L (ref 20–32)
Calcium: 8.6 mg/dL (ref 8.6–10.4)
Chloride: 99 mmol/L (ref 98–110)
Creat: 0.74 mg/dL (ref 0.60–0.95)
Globulin: 2.3 g/dL (calc) (ref 1.9–3.7)
Glucose, Bld: 98 mg/dL (ref 65–99)
Potassium: 4.5 mmol/L (ref 3.5–5.3)
Sodium: 134 mmol/L — ABNORMAL LOW (ref 135–146)
Total Bilirubin: 0.4 mg/dL (ref 0.2–1.2)
Total Protein: 6.6 g/dL (ref 6.1–8.1)

## 2022-07-29 LAB — HEMOGLOBIN A1C
Hgb A1c MFr Bld: 6 % of total Hgb — ABNORMAL HIGH (ref <5.7)
Mean Plasma Glucose: 126 mg/dL
eAG (mmol/L): 7 mmol/L

## 2022-07-29 LAB — TSH: TSH: 2.01 mIU/L (ref 0.40–4.50)

## 2022-07-29 LAB — VITAMIN B12: Vitamin B-12: 1270 pg/mL — ABNORMAL HIGH (ref 200–1100)

## 2022-07-31 ENCOUNTER — Other Ambulatory Visit: Payer: Self-pay | Admitting: Family Medicine

## 2022-07-31 MED ORDER — LEVOTHYROXINE SODIUM 75 MCG PO TABS: 75.0000 ug | ORAL_TABLET | Freq: Every day | ORAL | 3 refills | Status: DC

## 2022-08-05 ENCOUNTER — Encounter: Payer: Self-pay | Admitting: Family Medicine

## 2022-09-25 ENCOUNTER — Ambulatory Visit
Admission: RE | Admit: 2022-09-25 | Discharge: 2022-09-25 | Disposition: A | Discharge status: Home / Self Care | Payer: 59 | Source: Ambulatory Visit | Attending: Family Medicine | Admitting: Family Medicine

## 2022-09-25 DIAGNOSIS — Z1231 Encounter for screening mammogram for malignant neoplasm of breast: Secondary | ICD-10-CM

## 2022-10-03 ENCOUNTER — Ambulatory Visit: Payer: 59 | Admitting: Adult Health

## 2022-10-25 ENCOUNTER — Ambulatory Visit (INDEPENDENT_AMBULATORY_CARE_PROVIDER_SITE_OTHER): Payer: 59

## 2022-10-25 VITALS — Wt 147.0 lb

## 2022-10-25 DIAGNOSIS — Z0000 Encounter for general adult medical examination without abnormal findings: Secondary | ICD-10-CM | POA: Diagnosis not present

## 2022-10-25 NOTE — Patient Instructions (Signed)
[name] , Thank you for taking time to come for your Medicare Wellness Visit. I appreciate your ongoing commitment to your health goals. Please review the following plan we discussed and let me know if I can assist you in the future.   These are the goals we discussed:  Goals      Increase physical activity     Increase activity.      Patient Stated     Stay well      sleep     Increase amount of sleep nightly, will start by decreasing coffee intake after 7 pm.         This is a list of the screening recommended for you and due dates:  Health Maintenance  Topic Date Due   DEXA scan (bone density measurement)  09/18/2022   Flu Shot  11/16/2022   DTaP/Tdap/Td vaccine (3 - Td or Tdap) 12/18/2022   Mammogram  09/25/2023   Medicare Annual Wellness Visit  10/25/2023   Pneumonia Vaccine  Completed   HPV Vaccine  Aged Out   COVID-19 Vaccine  Discontinued   Zoster (Shingles) Vaccine  Discontinued    Advanced directives: Advance directive discussed with you today. Even though you declined this today please call our office should you change your mind and we can give you the proper paperwork for you to fill out.  Conditions/risks identified: stay well   Next appointment: Follow up in one year for your annual wellness visit    Preventive Care 65 Years and Older, Female Preventive care refers to lifestyle choices and visits with your health care provider that can promote health and wellness. What does preventive care include? A yearly physical exam. This is also called an annual well check. Dental exams once or twice a year. Routine eye exams. Ask your health care provider how often you should have your eyes checked. Personal lifestyle choices, including: Daily care of your teeth and gums. Regular physical activity. Eating a healthy diet. Avoiding tobacco and drug use. Limiting alcohol use. Practicing safe sex. Taking low-dose aspirin every day. Taking vitamin and mineral  supplements as recommended by your health care provider. What happens during an annual well check? The services and screenings done by your health care provider during your annual well check will depend on your age, overall health, lifestyle risk factors, and family history of disease. Counseling  Your health care provider may ask you questions about your: Alcohol use. Tobacco use. Drug use. Emotional well-being. Home and relationship well-being. Sexual activity. Eating habits. History of falls. Memory and ability to understand (cognition). Work and work Astronomer. Reproductive health. Screening  You may have the following tests or measurements: Height, weight, and BMI. Blood pressure. Lipid and cholesterol levels. These may be checked every 5 years, or more frequently if you are over 24 years old. Skin check. Lung cancer screening. You may have this screening every year starting at age 61 if you have a 30-pack-year history of smoking and currently smoke or have quit within the past 15 years. Fecal occult blood test (FOBT) of the stool. You may have this test every year starting at age 32. Flexible sigmoidoscopy or colonoscopy. You may have a sigmoidoscopy every 5 years or a colonoscopy every 10 years starting at age 82. Hepatitis C blood test. Hepatitis B blood test. Sexually transmitted disease (STD) testing. Diabetes screening. This is done by checking your blood sugar (glucose) after you have not eaten for a while (fasting). You may have this  done every 1-3 years. Bone density scan. This is done to screen for osteoporosis. You may have this done starting at age 34. Mammogram. This may be done every 1-2 years. Talk to your health care provider about how often you should have regular mammograms. Talk with your health care provider about your test results, treatment options, and if necessary, the need for more tests. Vaccines  Your health care provider may recommend certain  vaccines, such as: Influenza vaccine. This is recommended every year. Tetanus, diphtheria, and acellular pertussis (Tdap, Td) vaccine. You may need a Td booster every 10 years. Zoster vaccine. You may need this after age 70. Pneumococcal 13-valent conjugate (PCV13) vaccine. One dose is recommended after age 3. Pneumococcal polysaccharide (PPSV23) vaccine. One dose is recommended after age 27. Talk to your health care provider about which screenings and vaccines you need and how often you need them. This information is not intended to replace advice given to you by your health care provider. Make sure you discuss any questions you have with your health care provider. Document Released: 04/30/2015 Document Revised: 12/22/2015 Document Reviewed: 02/02/2015 Elsevier Interactive Patient Education  2017 [employer].  Fall Prevention in the Home Falls can cause injuries. They can happen to people of all ages. There are many things you can do to make your home safe and to help prevent falls. What can I do on the outside of my home? Regularly fix the edges of walkways and driveways and fix any cracks. Remove anything that might make you trip as you walk through a door, such as a raised step or threshold. Trim any bushes or trees on the path to your home. Use bright outdoor lighting. Clear any walking paths of anything that might make someone trip, such as rocks or tools. Regularly check to see if handrails are loose or broken. Make sure that both sides of any steps have handrails. Any raised decks and porches should have guardrails on the edges. Have any leaves, snow, or ice cleared regularly. Use sand or salt on walking paths during winter. Clean up any spills in your garage right away. This includes oil or grease spills. What can I do in the bathroom? Use night lights. Install grab bars by the toilet and in the tub and shower. Do not use towel bars as grab bars. Use non-skid mats or decals in  the tub or shower. If you need to sit down in the shower, use a plastic, non-slip stool. Keep the floor dry. Clean up any water that spills on the floor as soon as it happens. Remove soap buildup in the tub or shower regularly. Attach bath mats securely with double-sided non-slip rug tape. Do not have throw rugs and other things on the floor that can make you trip. What can I do in the bedroom? Use night lights. Make sure that you have a light by your bed that is easy to reach. Do not use any sheets or blankets that are too big for your bed. They should not hang down onto the floor. Have a firm chair that has side arms. You can use this for support while you get dressed. Do not have throw rugs and other things on the floor that can make you trip. What can I do in the kitchen? Clean up any spills right away. Avoid walking on wet floors. Keep items that you use a lot in easy-to-reach places. If you need to reach something above you, use a strong step stool that  has a grab bar. Keep electrical cords out of the way. Do not use floor polish or wax that makes floors slippery. If you must use wax, use non-skid floor wax. Do not have throw rugs and other things on the floor that can make you trip. What can I do with my stairs? Do not leave any items on the stairs. Make sure that there are handrails on both sides of the stairs and use them. Fix handrails that are broken or loose. Make sure that handrails are as long as the stairways. Check any carpeting to make sure that it is firmly attached to the stairs. Fix any carpet that is loose or worn. Avoid having throw rugs at the top or bottom of the stairs. If you do have throw rugs, attach them to the floor with carpet tape. Make sure that you have a light switch at the top of the stairs and the bottom of the stairs. If you do not have them, ask someone to add them for you. What else can I do to help prevent falls? Wear shoes that: Do not have high  heels. Have rubber bottoms. Are comfortable and fit you well. Are closed at the toe. Do not wear sandals. If you use a stepladder: Make sure that it is fully opened. Do not climb a closed stepladder. Make sure that both sides of the stepladder are locked into place. Ask someone to hold it for you, if possible. Clearly mark and make sure that you can see: Any grab bars or handrails. First and last steps. Where the edge of each step is. Use tools that help you move around (mobility aids) if they are needed. These include: Canes. Walkers. Scooters. Crutches. Turn on the lights when you go into a dark area. Replace any light bulbs as soon as they burn out. Set up your furniture so you have a clear path. Avoid moving your furniture around. If any of your floors are uneven, fix them. If there are any pets around you, be aware of where they are. Review your medicines with your doctor. Some medicines can make you feel dizzy. This can increase your chance of falling. Ask your doctor what other things that you can do to help prevent falls. This information is not intended to replace advice given to you by your health care provider. Make sure you discuss any questions you have with your health care provider. Document Released: 01/28/2009 Document Revised: 09/09/2015 Document Reviewed: 05/08/2014 Elsevier Interactive Patient Education  2017 [employer].

## 2022-10-25 NOTE — Progress Notes (Signed)
Subjective:   Amy Rodriguez is a 89 y.o. female who presents for Medicare Annual (Subsequent) preventive examination.  Visit Complete: Virtual  I connected with  Amy Rodriguez on 10/25/22 by a audio enabled telemedicine application and verified that I am speaking with the correct person using two identifiers.  Patient Location: Home  Provider Location: Home Office  I discussed the limitations of evaluation and management by telemedicine. The patient expressed understanding and agreed to proceed.  Review of Systems     Cardiac Risk Factors include: advanced age (>68men, >52 women)     Objective:    Today's Vitals   10/25/22 1433  Weight: 147 lb (66.7 kg)   Body mass index is 28.12 kg/m.     10/25/2022    2:39 PM 11/26/2021   10:52 AM 08/14/2017    2:06 PM 08/07/2016    2:06 PM 10/07/2015    1:47 PM 09/21/2015   10:58 AM 08/03/2015   10:40 AM  Advanced Directives  Does Patient Have a Medical Advance Directive? No No Yes Yes No No   Type of Best boy of [place];Living will Living will     Copy of Healthcare Power of Attorney in Chart?   No - copy requested    No - copy requested  Would patient like information on creating a medical advance directive? No - Patient declined No - Patient declined    No - patient declined information     Current Medications (verified) Outpatient Encounter Medications as of 10/25/2022  Medication Sig   albuterol (PROAIR HFA) 108 (90 Base) MCG/ACT inhaler Inhale 2 puffs into the lungs every 4 (four) hours as needed for wheezing or shortness of breath.   amLODipine (NORVASC) 5 MG tablet Take 1 tablet (5 mg total) by mouth daily.   cholecalciferol (VITAMIN D3) 25 MCG (1000 UNIT) tablet Take 1,000 Units by mouth daily.   Cyanocobalamin 1000 MCG LOZG Take by mouth.   diclofenac sodium (VOLTAREN) 1 % GEL Apply 2 g topically as needed.   fexofenadine (ALLEGRA ALLERGY) 180 MG tablet Take 1 tablet (180 mg total) by mouth  daily.   levothyroxine (SYNTHROID) 75 MCG tablet Take 1 tablet (75 mcg total) by mouth daily before breakfast.   losartan (COZAAR) 25 MG tablet Take 0.5 tablets (12.5 mg total) by mouth daily.   simvastatin (ZOCOR) 10 MG tablet Take 1 tablet (10 mg total) by mouth every evening.   budesonide-formoterol (SYMBICORT) 160-4.5 MCG/ACT inhaler Inhale 1 puff into the lungs 2 (two) times daily. (Patient not taking: Reported on 10/25/2022)   No facility-administered encounter medications on file as of 10/25/2022.    Allergies (verified) Benzonatate, Sulfonamide derivatives, Fosamax [alendronate], Hctz [hydrochlorothiazide], and Lisinopril   History: Past Medical History:  Diagnosis Date   Arthritis    sed rate 10, RF, CCP pending   Blood in stool    Cervical cancer (HCC)    s/p hysterectomy/oop   DDD (degenerative disc disease), lumbosacral    Depression    Due to husbands passing away 09/22/2011   Diverticulosis of colon    Environmental allergies    GERD (gastroesophageal reflux disease)    H pylori ulcer    not treated due to expense  10/2001   Hand dermatitis    HTN (hypertension)    Hyperlipidemia    Hypothyroidism    TSH 14.488 (11/2005)   IBS (irritable bowel syndrome)    Lactose intolerance    Migraines    "  get them very rarely now" (02/08/2012)   Multiple pigmented nevi    last derm evaluation 01/2003   OA (osteoarthritis)    multiple sites   Other seborrheic keratosis    Pain in joint, lower leg    Pneumonia    "couple times in my lifetime" (02/08/2012)   PONV (postoperative nausea and vomiting)    "and takes me a long time to come out under it" (02/08/2012)   Posterior vitreous detachment 1996   Postmenopausal    s/p hysterectomy for h/o cervical cancer 1982 (both ovaries taken at that time) now on hormonal replacement    Postmenopausal HRT (hormone replacement therapy)    PPD positive    history +PPD 1984, no treatment, no abnormal CXR   Shortness of breath    with  ambulation   Stroke [facility])    TIA- April 2016 last one   Supraventricular tachycardia, paroxysmal    Urinary frequency    Vaginal pruritus    Past Surgical History:  Procedure Laterality Date   ABDOMINAL HYSTERECTOMY  1982   CARDIOVASCULAR STRESS TEST  12/07/11   Normal nuclear stress test   CATARACT EXTRACTION W/ INTRAOCULAR LENS IMPLANT  ?1997   right   COLONOSCOPY     EYE SURGERY     cataract removal right eye   TOTAL ABDOMINAL HYSTERECTOMY W/ BILATERAL SALPINGOOPHORECTOMY  1982   TOTAL KNEE ARTHROPLASTY  02/08/2012   Procedure: TOTAL KNEE ARTHROPLASTY;  Surgeon: Velna Ochs, MD;  Location: [facility] OR;  Service: Orthopedics;  Laterality: Left;   Family History  Problem Relation Age of Onset   Breast cancer Mother 73   Lung cancer Brother 76   Cancer Brother        lung   Heart failure Father        congestive   Heart disease Father    Epilepsy Son    Kidney disease Son        tuberous sclerosis, both kidneys removed, has transplant   Diabetes Maternal Uncle    Diabetes Paternal Aunt    Cancer Maternal Grandmother        breast   Breast cancer Maternal Grandmother    Heart disease Son        [place]   COPD Son    Rectal cancer Maternal Aunt    Colon cancer Neg Hx    Esophageal cancer Neg Hx    Stomach cancer Neg Hx    Social History   Socioeconomic History   Marital status: Widowed    Spouse name: Not on file   Number of children: 3   Years of education: 12TH   Highest education level: Not on file  Occupational History   Occupation: retired    Comment: Airline pilot: RETIRED  Tobacco Use   Smoking status: Former    Packs/day: 1.00    Years: 25.00    Additional pack years: 0.00    Total pack years: 25.00    Types: Cigarettes    Quit date: 04/17/1980    Years since quitting: 42.5   Smokeless tobacco: Never   Tobacco comments:    passive smoker, husband smoked  Vaping Use   Vaping Use: Never used  Substance and Sexual Activity   Alcohol  use: No   Drug use: No   Sexual activity: Never  Other Topics Concern   Not on file  Social History Narrative   Lives with son, Karleen [place] who has medical problems-they help each other.  Her husband died 6/13 after 39 years of marriage.  She has 2 other sons that live within 2 hrs from her. She does not drive-she uses [facility]. She used to work in Engineering geologist, retired.     Tobacco: 25 pack yr hx, quit 1985.   Social Determinants of Health   Financial Resource Strain: Low Risk  (10/25/2022)   Overall Financial Resource Strain (CARDIA)    Difficulty of Paying Living Expenses: Not hard at all  Food Insecurity: No Food Insecurity (10/25/2022)   Hunger Vital Sign    Worried About Running Out of Food in the Last Year: Never true    Ran Out of Food in the Last Year: Never true  Transportation Needs: No Transportation Needs (10/25/2022)   PRAPARE - Administrator, Civil Service (Medical): No    Lack of Transportation (Non-Medical): No  Physical Activity: Inactive (10/25/2022)   Exercise Vital Sign    Days of Exercise per Week: 0 days    Minutes of Exercise per Session: 0 min  Stress: Stress Concern Present (10/25/2022)   [employer] of Occupational Health - Occupational Stress Questionnaire    Feeling of Stress : To some extent  Social Connections: Moderately Isolated (10/25/2022)   Social Connection and Isolation Panel [NHANES]    Frequency of Communication with Friends and Family: More than three times a week    Frequency of Social Gatherings with Friends and Family: Twice a week    Attends Religious Services: More than 4 times per year    Active Member of [employer] or Organizations: No    Attends Banker Meetings: Never    Marital Status: Widowed    Tobacco Counseling Counseling given: Not Answered Tobacco comments: passive smoker, husband smoked   Clinical Intake:  Pre-visit preparation completed: Yes  Pain : No/denies pain     BMI - recorded:  28.12 Nutritional Status: BMI 25 -29 Overweight Nutritional Risks: None Diabetes: No  How often do you need to have someone help you when you read instructions, pamphlets, or other written materials from your doctor or pharmacy?: 1 - Never  Interpreter Needed?: No  Information entered by :: Lanier Ensign, LPN   Activities of Daily Living    10/25/2022    2:35 PM 11/26/2021   10:52 AM  In your present state of health, do you have any difficulty performing the following activities:  Hearing? 1 1  Comment HOH   Vision? 0 0  Difficulty concentrating or making decisions? 0 0  Walking or climbing stairs? 0 1  Dressing or bathing? 0 0  Doing errands, shopping? 0 0  Preparing Food and eating ? N N  Using the Toilet? N N  In the past six months, have you accidently leaked urine? N N  Do you have problems with loss of bowel control? N N  Managing your Medications? N N  Managing your Finances? N N  Housekeeping or managing your Housekeeping? N N    Patient Care Team: Natalia Leatherwood, DO as PCP - General (Family Medicine) Mckinley Jewel, MD as Consulting Physician (Ophthalmology) Leslye Peer, MD as Consulting Physician (Pulmonary Disease) Jens Som Madolyn Frieze, MD as Consulting Physician (Cardiology) Micki Riley, MD as Consulting Physician (Neurology) Louis Meckel, MD (Inactive) as Consulting Physician (Gastroenterology) Carlus Pavlov, MD as Consulting Physician (Internal Medicine) Marcene Corning, MD as Consulting Physician (Orthopedic Surgery)  Indicate any recent Medical Services you may have received from other than [facility] providers in  the past year (date may be approximate).     Assessment:   This is a routine wellness examination for [place].  Hearing/Vision screen Hearing Screening - Comments:: Pt stated HOH Vision Screening - Comments:: Pt follows up with [facility]   Dietary issues and exercise activities discussed:     Goals Addressed              This Visit's Progress    Patient Stated       Stay well        Depression Screen    10/25/2022    2:37 PM 11/26/2021   10:50 AM 07/06/2021   10:13 AM 01/19/2021    1:45 PM 03/23/2020    1:56 PM 09/27/2018    1:30 PM 08/14/2017    2:07 PM  PHQ 2/9 Scores  PHQ - 2 Score 1 0 0 0 0 0 0    Fall Risk    10/25/2022    2:40 PM 11/26/2021   11:00 AM 07/06/2021   10:28 AM 01/19/2021    1:44 PM 03/23/2020    1:56 PM  Fall Risk   Falls in the past year? 0 0 0 0 0  Number falls in past yr: 0 0 0 0 0  Injury with Fall? 0 0 0 0 0  Risk for fall due to : Impaired vision  No Fall Risks    Follow up Falls prevention discussed Falls evaluation completed Falls evaluation completed Falls evaluation completed Falls evaluation completed    MEDICARE RISK AT HOME:  Medicare Risk at Home - 10/25/22 1440     Any stairs in or around the home? Yes    If so, are there any without handrails? No    Home free of loose throw rugs in walkways, pet beds, electrical cords, etc? Yes    Adequate lighting in your home to reduce risk of falls? Yes    Use of a cane, walker or w/c? No    Grab bars in the bathroom? No    Shower chair or bench in shower? No    Elevated toilet seat or a handicapped toilet? No             TIMED UP AND GO:  Was the test performed?  No    Cognitive Function:    11/26/2021   11:04 AM 08/14/2017    2:08 PM 08/07/2016    2:07 PM  MMSE - Mini Mental State Exam  Not completed: Unable to complete    Orientation to time  5 5  Orientation to Place  5 5  Registration  3 3  Attention/ Calculation  5 5  Recall  3 3  Language- name 2 objects  2 2  Language- repeat  1 1  Language- follow 3 step command  3 3  Language- read & follow direction  1 1  Write a sentence  1 1  Copy design  0 1  Total score  29 30        10/25/2022    2:40 PM 11/26/2021   10:55 AM  6CIT Screen  What Year? 0 points 0 points  What month? 0 points 0 points  What time? 0 points 0 points   Count back from 20 0 points 0 points  Months in reverse 0 points 0 points  Repeat phrase 0 points 0 points  Total Score 0 points 0 points    Immunizations Immunization History  Administered Date(s) Administered   Fluad Quad(high Dose  65+) 02/06/2019, 02/10/2020, 01/19/2021   Influenza Split 01/12/2011, 02/09/2012   Influenza Whole 02/08/2007, 01/24/2008, 12/16/2008, 01/27/2009, 02/17/2010   Influenza, High Dose Seasonal PF 01/16/2017, 01/25/2018   Influenza,inj,Quad PF,6+ Mos 12/17/2012, 12/19/2013, 01/08/2015, 01/06/2016   PFIZER(Purple Top)SARS-COV-2 Vaccination 07/02/2019, 07/23/2019, 03/23/2020   Pneumococcal Conjugate-13 07/08/2014   Pneumococcal Polysaccharide-23 05/23/2004   Td 06/16/2002   Tdap 12/17/2012   Zoster, Live 12/17/2012    TDAP status: Up to date  Flu Vaccine status: Due, Education has been provided regarding the importance of this vaccine. Advised may receive this vaccine at local pharmacy or Health Dept. Aware to provide a copy of the vaccination record if obtained from local pharmacy or Health Dept. Verbalized acceptance and understanding.  Pneumococcal vaccine status: Up to date  Covid-19 vaccine status: Completed vaccines  Qualifies for Shingles Vaccine? No   Zostavax completed No     Screening Tests Health Maintenance  Topic Date Due   DEXA SCAN  09/18/2022   INFLUENZA VACCINE  11/16/2022   DTaP/Tdap/Td (3 - Td or Tdap) 12/18/2022   MAMMOGRAM  09/25/2023   Medicare Annual Wellness (AWV)  10/25/2023   Pneumonia Vaccine 48+ Years old  Completed   HPV VACCINES  Aged Out   COVID-19 Vaccine  Discontinued   Zoster Vaccines- Shingrix  Discontinued    Health Maintenance  Health Maintenance Due  Topic Date Due   DEXA SCAN  09/18/2022    Colorectal cancer screening: No longer required.   Mammogram status: Completed 09/25/22. Repeat every year  Bone Density status: Completed 09/17/20 scheduled 02/15/23. Results reflect: Bone density results:  OSTEOPOROSIS. Repeat every 2 years.   Additional Screening:   Vision Screening: Recommended annual ophthalmology exams for early detection of glaucoma and other disorders of the eye. Is the patient up to date with their annual eye exam?  Yes  Who is the provider or what is the name of the office in which the patient attends annual eye exams? [facility] opthalmology  If pt is not established with a provider, would they like to be referred to a provider to establish care? No .   Dental Screening: Recommended annual dental exams for proper oral hygiene    Community Resource Referral / Chronic Care Management: CRR required this visit?  No   CCM required this visit?  No     Plan:     I have personally reviewed and noted the following in the patient's chart:   Medical and social history Use of alcohol, tobacco or illicit drugs  Current medications and supplements including opioid prescriptions. Patient is not currently taking opioid prescriptions. Functional ability and status Nutritional status Physical activity Advanced directives List of other physicians Hospitalizations, surgeries, and ER visits in previous 12 months Vitals Screenings to include cognitive, depression, and falls Referrals and appointments  In addition, I have reviewed and discussed with patient certain preventive protocols, quality metrics, and best practice recommendations. A written personalized care plan for preventive services as well as general preventive health recommendations were provided to patient.     Marzella Schlein, LPN   1/61/0960   After Visit Summary: (MyChart) Due to this being a telephonic visit, the after visit summary with patients personalized plan was offered to patient via MyChart   Nurse Notes: none

## 2022-10-26 NOTE — Progress Notes (Signed)
Cardiology Rodriguez Note   Patient Name: Amy Rodriguez Date of Encounter: 10/27/2022  Primary Care Provider:  Natalia Leatherwood, DO Primary Cardiologist:  Amy Millers, MD  Patient Profile    89 year old with hx of SVT, hypertrophic,  cardiomyopathy, Holter 12/14 showed sinus with pacs, pvcs and rare couplet. Carotid dopplers 8/18 showed 1-39 bilateral stenosis.  Echocardiogram June 2021 showed normal LV function, mild left ventricular hypertrophy, mild RV dysfunction, trace aortic insufficiency and possible small PFO.  There was also question of apical hypertrophic cardiomyopathy and hypertrabeculation of the RV apex.   Past Medical History    Past Medical History:  Diagnosis Date   Arthritis    sed rate 10, RF, CCP pending   Blood in stool    Cervical cancer (HCC)    s/p hysterectomy/oop   DDD (degenerative disc disease), lumbosacral    Depression    Due to husbands passing away 09/22/2011   Diverticulosis of colon    Environmental allergies    GERD (gastroesophageal reflux disease)    H pylori ulcer    not treated due to expense  10/2001   Hand dermatitis    HTN (hypertension)    Hyperlipidemia    Hypothyroidism    TSH 14.488 (11/2005)   IBS (irritable bowel syndrome)    Lactose intolerance    Migraines    "get them very rarely now" (02/08/2012)   Multiple pigmented nevi    last derm evaluation 01/2003   OA (osteoarthritis)    multiple sites   Other seborrheic keratosis    Pain in joint, lower leg    Pneumonia    "couple times in my lifetime" (02/08/2012)   PONV (postoperative nausea and vomiting)    "and takes me a long time to come out under it" (02/08/2012)   Posterior vitreous detachment 1996   Postmenopausal    s/p hysterectomy for h/o cervical cancer 1982 (both ovaries taken at that time) now on hormonal replacement    Postmenopausal HRT (hormone replacement therapy)    PPD positive    history +PPD 1984, no treatment, no abnormal CXR   Shortness of breath     with ambulation   Stroke Amy Rodriguez)    TIA- April 2016 last one   Supraventricular tachycardia, paroxysmal    Urinary frequency    Vaginal pruritus    Past Surgical History:  Procedure Laterality Date   ABDOMINAL HYSTERECTOMY  1982   CARDIOVASCULAR STRESS TEST  12/07/11   Normal nuclear stress test   CATARACT EXTRACTION W/ INTRAOCULAR LENS IMPLANT  ?1997   right   COLONOSCOPY     EYE SURGERY     cataract removal right eye   TOTAL ABDOMINAL HYSTERECTOMY W/ BILATERAL SALPINGOOPHORECTOMY  1982   TOTAL KNEE ARTHROPLASTY  02/08/2012   Procedure: TOTAL KNEE ARTHROPLASTY;  Surgeon: Amy Ochs, MD;  Location: [facility] OR;  Service: Orthopedics;  Laterality: Left;    Allergies  Allergies  Allergen Reactions   Benzonatate Nausea And Vomiting    "tessalon pearl"   Sulfonamide Derivatives Itching and Rash   Fosamax [Alendronate]    Hctz [Hydrochlorothiazide] Other (See Comments)    Severe hyponatremia    Lisinopril Cough    History of Present Illness    Amy Rodriguez comes today for ongoing assessment and management of hypertension, history of PSVT, history of TIA, and apical variant hypertrophic cardiomyopathy.  She is completely without complaints.  She remains active around her home.  She does not exercise regularly however  she is not sedentary and remains busy.  She denies any lower extremity edema, rapid heart rate or palpitations, dizziness, or dyspnea on exertion.  She is medically compliant.  Labs are completed annually by PCP.  Home Medications    Current Outpatient Medications  Medication Sig Dispense Refill   albuterol (PROAIR HFA) 108 (90 Base) MCG/ACT inhaler Inhale 2 puffs into the lungs every 4 (four) hours as needed for wheezing or shortness of breath. 1 each 3   amLODipine (NORVASC) 5 MG tablet Take 1 tablet (5 mg total) by mouth daily. 90 tablet 1   budesonide-formoterol (SYMBICORT) 160-4.5 MCG/ACT inhaler Inhale 1 puff into the lungs 2 (two) times daily. 6 g 11    cholecalciferol (VITAMIN D3) 25 MCG (1000 UNIT) tablet Take 1,000 Units by mouth daily.     Cyanocobalamin 1000 MCG LOZG Take by mouth.     diclofenac sodium (VOLTAREN) 1 % GEL Apply 2 g topically as needed. 100 g 11   fexofenadine (ALLEGRA ALLERGY) 180 MG tablet Take 1 tablet (180 mg total) by mouth daily. 90 tablet 3   levothyroxine (SYNTHROID) 75 MCG tablet Take 1 tablet (75 mcg total) by mouth daily before breakfast. 90 tablet 3   losartan (COZAAR) 25 MG tablet Take 0.5 tablets (12.5 mg total) by mouth daily. 45 tablet 1   simvastatin (ZOCOR) 10 MG tablet Take 1 tablet (10 mg total) by mouth every evening. 45 tablet 3   No current facility-administered medications for this visit.     Family History    Family History  Problem Relation Age of Onset   Breast cancer Mother 83   Lung cancer Brother 87   Cancer Brother        lung   Heart failure Father        congestive   Heart disease Father    Epilepsy Son    Kidney disease Son        tuberous sclerosis, both kidneys removed, has transplant   Diabetes Maternal Uncle    Diabetes Paternal Aunt    Cancer Maternal Grandmother        breast   Breast cancer Maternal Grandmother    Heart disease Son        [place]   COPD Son    Rectal cancer Maternal Aunt    Colon cancer Neg Hx    Esophageal cancer Neg Hx    Stomach cancer Neg Hx    She indicated that her mother is deceased. She indicated that her father is deceased. She indicated that her brother is deceased. She indicated that her maternal grandmother is deceased. She indicated that her maternal grandfather is deceased. She indicated that her paternal grandmother is deceased. She indicated that her paternal grandfather is deceased. She indicated that all of her three sons are alive. She indicated that the status of her maternal aunt is unknown. She indicated that her maternal uncle is deceased. She indicated that her paternal aunt is deceased. She indicated that the status  of her neg hx is unknown.  Social History    Social History   Socioeconomic History   Marital status: Widowed    Spouse name: Not on file   Number of children: 3   Years of education: 12TH   Highest education level: Not on file  Occupational History   Occupation: retired    Comment: Airline pilot: RETIRED  Tobacco Use   Smoking status: Former    Current packs/day: 0.00  Average packs/day: 1 pack/day for 25.0 years (25.0 ttl pk-yrs)    Types: Cigarettes    Start date: 04/18/1955    Quit date: 04/17/1980    Years since quitting: 42.5   Smokeless tobacco: Never   Tobacco comments:    passive smoker, husband smoked  Vaping Use   Vaping status: Never Used  Substance and Sexual Activity   Alcohol use: No   Drug use: No   Sexual activity: Never  Other Topics Concern   Not on file  Social History Narrative   Lives with son, Karleen [place] who has medical problems-they help each other. Her husband died 2022/10/25 after 39 years of marriage.  She has 2 other sons that live within 2 hrs from her. She does not drive-she uses [facility]. She used to work in Engineering geologist, retired.     Tobacco: 25 pack yr hx, quit 1985.   Social Determinants of Health   Financial Resource Strain: Low Risk  (10/25/2022)   Overall Financial Resource Strain (CARDIA)    Difficulty of Paying Living Expenses: Not hard at all  Food Insecurity: No Food Insecurity (10/25/2022)   Hunger Vital Sign    Worried About Running Out of Food in the Last Year: Never true    Ran Out of Food in the Last Year: Never true  Transportation Needs: No Transportation Needs (10/25/2022)   PRAPARE - Administrator, Civil Service (Medical): No    Lack of Transportation (Non-Medical): No  Physical Activity: Inactive (10/25/2022)   Exercise Vital Sign    Days of Exercise per Week: 0 days    Minutes of Exercise per Session: 0 min  Stress: Stress Concern Present (10/25/2022)   [employer] of Occupational Health -  Occupational Stress Questionnaire    Feeling of Stress : To some extent  Social Connections: Moderately Isolated (10/25/2022)   Social Connection and Isolation Panel [NHANES]    Frequency of Communication with Friends and Family: More than three times a week    Frequency of Social Gatherings with Friends and Family: Twice a week    Attends Religious Services: More than 4 times per year    Active Member of [employer] or Organizations: No    Attends Banker Meetings: Never    Marital Status: Widowed  Intimate Partner Violence: Not At Risk (10/25/2022)   Humiliation, Afraid, Rape, and Kick questionnaire    Fear of Current or Ex-Partner: No    Emotionally Abused: No    Physically Abused: No    Sexually Abused: No     Review of Systems    General:  No chills, fever, night sweats or weight changes.  Cardiovascular:  No chest pain, dyspnea on exertion, edema, orthopnea, palpitations, paroxysmal nocturnal dyspnea. Dermatological: No rash, lesions/masses Respiratory: No cough, dyspnea Urologic: No hematuria, dysuria Abdominal:   No nausea, vomiting, diarrhea, bright red blood per rectum, melena, or hematemesis Neurologic:  No visual changes, wkns, changes in mental status. All other systems reviewed and are otherwise negative except as noted above.  EKG Interpretation Date/Time:  Friday October 27 2022 15:16:08 EDT Ventricular Rate:  57 PR Interval:  188 QRS Duration:  120 QT Interval:  452 QTC Calculation: 439 R Axis:   85  Text Interpretation: Sinus bradycardia Right bundle branch block When compared with ECG of 25-Jul-2013 18:15, PREVIOUS ECG IS PRESENT Confirmed by Joni Reining [phone]) on 10/27/2022 3:29:40 PM    Physical Exam    VS:  BP (!) 124/56 (BP Location:  Left Arm, Patient Position: Sitting, Cuff Size: Normal)   Pulse 64   Ht 5\' 1"  (1.549 m)   Wt 146 lb 6.4 oz (66.4 kg)   SpO2 93%   BMI 27.66 kg/m  , BMI Body mass index is 27.66 kg/m.     GEN: Well  nourished, well developed, in no acute distress. HEENT: normal. Neck: Supple, no JVD, carotid bruits, or masses. Cardiac: RRR, 2/6 systolic murmurs, rubs, or gallops. No clubbing, cyanosis, edema.  Radials/DP/PT 2+ and equal bilaterally.  Respiratory:  Respirations regular and unlabored, clear to auscultation bilaterally. GI: Soft, nontender, nondistended, BS + x 4. MS: no deformity or atrophy. Skin: warm and dry, no rash. Neuro:  Strength and sensation are intact. Psych: Normal affect.  EKG Interpretation Date/Time:  Friday October 27 2022 15:16:08 EDT Ventricular Rate:  57 PR Interval:  188 QRS Duration:  120 QT Interval:  452 QTC Calculation: 439 R Axis:   85  Text Interpretation: Sinus bradycardia Right bundle branch block When compared with ECG of 25-Jul-2013 18:15, PREVIOUS ECG IS PRESENT Confirmed by Joni Reining [id number]) on 10/27/2022 3:29:40 PM   Lab Results  Component Value Date   WBC 5.7 07/28/2022   HGB 13.4 07/28/2022   HCT 39.8 07/28/2022   MCV 84.3 07/28/2022   PLT 224 07/28/2022   Lab Results  Component Value Date   CREATININE 0.74 07/28/2022   BUN 8 07/28/2022   NA 134 (L) 07/28/2022   K 4.5 07/28/2022   CL 99 07/28/2022   CO2 23 07/28/2022   Lab Results  Component Value Date   ALT 7 07/28/2022   AST 15 07/28/2022   ALKPHOS 76 07/06/2021   BILITOT 0.4 07/28/2022   Lab Results  Component Value Date   CHOL 128 07/28/2022   HDL 47 (L) 07/28/2022   LDLCALC 59 07/28/2022   LDLDIRECT 71 09/07/2020   TRIG 136 07/28/2022   CHOLHDL 2.7 07/28/2022    Lab Results  Component Value Date   HGBA1C 6.0 (H) 07/28/2022     Review of Prior Studies EKG Interpretation Date/Time:  Friday October 27 2022 15:16:08 EDT Ventricular Rate:  57 PR Interval:  188 QRS Duration:  120 QT Interval:  452 QTC Calculation: 439 R Axis:   85  Text Interpretation: Sinus bradycardia Right bundle branch block When compared with ECG of 25-Jul-2013 18:15, PREVIOUS ECG IS  PRESENT Confirmed by Joni Reining [phone]) on 10/27/2022 3:29:40 PM    Assessment & Plan   1.  Hypertension: Blood pressure is currently well-controlled on current medication regimen to include amlodipine 5 mg daily and losartan 25 mg daily.  She is to continue this regimen and avoid salted foods.  2.  Hypercholesterolemia: Labs are followed by PCP.  Goal of LDL less than 100.  Continue simvastatin 10 mg daily.  3.  PSVT: She is not on any AV nodal blocking agents currently.  She has no complaints of racing heart rate or palpitations.  She should report these on follow-up appointments.  No changes in her current regimen.         Signed, Bettey Mare. Liborio Nixon, ANP, AACC   10/27/2022 5:10 PM      Office [phone] Fax [phone]  Notice: This dictation was prepared with Dragon dictation along with smaller phrase technology. Any transcriptional errors that result from this process are unintentional and may not be corrected upon review.

## 2022-10-27 ENCOUNTER — Ambulatory Visit: Payer: 59 | Attending: Adult Health | Admitting: Adult Health

## 2022-10-27 ENCOUNTER — Encounter: Payer: Self-pay | Admitting: Adult Health

## 2022-10-27 VITALS — BP 124/56 | HR 64 | Ht 61.0 in | Wt 146.4 lb

## 2022-10-27 DIAGNOSIS — E7800 Pure hypercholesterolemia, unspecified: Secondary | ICD-10-CM | POA: Diagnosis not present

## 2022-10-27 DIAGNOSIS — I471 Supraventricular tachycardia, unspecified: Secondary | ICD-10-CM | POA: Diagnosis not present

## 2022-10-27 DIAGNOSIS — I10 Essential (primary) hypertension: Secondary | ICD-10-CM | POA: Diagnosis not present

## 2022-10-27 NOTE — Patient Instructions (Signed)
Medication Instructions:  No changes  *If you need a refill on your cardiac medications before your next appointment, please call your pharmacy*   Lab Work: None needed  If you have labs (blood work) drawn today and your tests are completely normal, you will receive your results only by: MyChart Message (if you have MyChart) OR A paper copy in the mail If you have any lab test that is abnormal or we need to change your treatment, we will call you to review the results.   Testing/Procedures: None neeced   Follow-Up: At [facility], you and your health needs are our priority.  As part of our continuing mission to provide you with exceptional heart care, we have created designated Provider Care Teams.  These Care Teams include your primary Cardiologist (physician) and Advanced Practice Providers (APPs -  Physician Assistants and Nurse Practitioners) who all work together to provide you with the care you need, when you need it.  We recommend signing up for the patient portal called "MyChart".  Sign up information is provided on this After Visit Summary.  MyChart is used to connect with patients for Virtual Visits (Telemedicine).  Patients are able to view lab/test results, encounter notes, upcoming appointments, etc.  Non-urgent messages can be sent to your provider as well.   To learn more about what you can do with MyChart, go to [email or web address].    Your next appointment:   12 month(s)  Provider:   Dr. Jens Som

## 2022-11-24 DIAGNOSIS — H5213 Myopia, bilateral: Secondary | ICD-10-CM | POA: Diagnosis not present

## 2022-11-24 DIAGNOSIS — H25812 Combined forms of age-related cataract, left eye: Secondary | ICD-10-CM | POA: Diagnosis not present

## 2022-11-24 DIAGNOSIS — H02834 Dermatochalasis of left upper eyelid: Secondary | ICD-10-CM | POA: Diagnosis not present

## 2022-11-24 DIAGNOSIS — H02831 Dermatochalasis of right upper eyelid: Secondary | ICD-10-CM | POA: Diagnosis not present

## 2022-11-30 ENCOUNTER — Encounter (INDEPENDENT_AMBULATORY_CARE_PROVIDER_SITE_OTHER): Payer: Self-pay

## 2022-12-17 ENCOUNTER — Other Ambulatory Visit: Payer: Self-pay | Admitting: Family Medicine

## 2023-01-12 ENCOUNTER — Ambulatory Visit (INDEPENDENT_AMBULATORY_CARE_PROVIDER_SITE_OTHER): Payer: 59 | Admitting: Family Medicine

## 2023-01-12 ENCOUNTER — Other Ambulatory Visit: Payer: Self-pay | Admitting: Family Medicine

## 2023-01-12 ENCOUNTER — Encounter: Payer: Self-pay | Admitting: Family Medicine

## 2023-01-12 VITALS — BP 118/64 | HR 63 | Temp 97.8°F | Wt 145.2 lb

## 2023-01-12 DIAGNOSIS — G459 Transient cerebral ischemic attack, unspecified: Secondary | ICD-10-CM

## 2023-01-12 DIAGNOSIS — Z8673 Personal history of transient ischemic attack (TIA), and cerebral infarction without residual deficits: Secondary | ICD-10-CM

## 2023-01-12 DIAGNOSIS — E063 Autoimmune thyroiditis: Secondary | ICD-10-CM

## 2023-01-12 DIAGNOSIS — Z23 Encounter for immunization: Secondary | ICD-10-CM

## 2023-01-12 DIAGNOSIS — E782 Mixed hyperlipidemia: Secondary | ICD-10-CM

## 2023-01-12 DIAGNOSIS — I471 Supraventricular tachycardia, unspecified: Secondary | ICD-10-CM | POA: Diagnosis not present

## 2023-01-12 DIAGNOSIS — R7303 Prediabetes: Secondary | ICD-10-CM | POA: Diagnosis not present

## 2023-01-12 DIAGNOSIS — E038 Other specified hypothyroidism: Secondary | ICD-10-CM

## 2023-01-12 DIAGNOSIS — E538 Deficiency of other specified B group vitamins: Secondary | ICD-10-CM

## 2023-01-12 DIAGNOSIS — I422 Other hypertrophic cardiomyopathy: Secondary | ICD-10-CM | POA: Diagnosis not present

## 2023-01-12 DIAGNOSIS — I10 Essential (primary) hypertension: Secondary | ICD-10-CM | POA: Diagnosis not present

## 2023-01-12 DIAGNOSIS — I729 Aneurysm of unspecified site: Secondary | ICD-10-CM | POA: Diagnosis not present

## 2023-01-12 MED ORDER — GABAPENTIN 100 MG PO CAPS: 100.0000 mg | ORAL_CAPSULE | Freq: Every day | ORAL | 1 refills | Status: DC

## 2023-01-12 MED ORDER — AMLODIPINE BESYLATE 5 MG PO TABS: 5.0000 mg | ORAL_TABLET | Freq: Every day | ORAL | 1 refills | Status: DC

## 2023-01-12 MED ORDER — LOSARTAN POTASSIUM 25 MG PO TABS
12.5000 mg | ORAL_TABLET | Freq: Every day | ORAL | 1 refills | Status: DC
Start: 2023-01-12 — End: 2023-07-27

## 2023-01-12 NOTE — Telephone Encounter (Signed)
Pt has appt today 9/27

## 2023-01-12 NOTE — Patient Instructions (Addendum)
Return in about 7 months (around 07/29/2023) for cpe (20 min), Routine chronic condition follow-up.        Great to see you today.  I have refilled the medication(s) we provide.   If labs were collected or images ordered, we will inform you of  results once we have received them and reviewed. We will contact you either by echart message, or telephone call.  Please give ample time to the testing facility, and our office to run,  receive and review results. Please do not call inquiring of results, even if you can see them in your chart. We will contact you as soon as we are able. If it has been over 1 week since the test was completed, and you have not yet heard from [place], then please call us.    - echart message- for normal results that have been seen by the patient already.   - telephone call: abnormal results or if patient has not viewed results in their echart.  If a referral to a specialist was entered for you, please call us in 2 weeks if you have not heard from the specialist office to schedule.

## 2023-01-12 NOTE — Progress Notes (Signed)
Amy Rodriguez , 02-Apr-1934, 89 y.o., female MRN: [id number] Patient Care Team    Relationship Specialty Notifications Start End  Natalia Leatherwood, DO PCP - General Family Medicine  01/08/15   Lewayne Bunting, MD PCP - Cardiology Cardiology  10/27/22   Mckinley Jewel, MD Consulting Physician Ophthalmology  08/03/15   Leslye Peer, MD Consulting Physician Pulmonary Disease  08/03/15   Lewayne Bunting, MD Consulting Physician Cardiology  08/03/15   Micki Riley, MD Consulting Physician Neurology  08/03/15   Louis Meckel, MD (Inactive) Consulting Physician Gastroenterology  08/04/15   Carlus Pavlov, MD Consulting Physician Internal Medicine  08/07/16   Marcene Corning, MD Consulting Physician Orthopedic Surgery  08/07/16     Chief Complaint  Patient presents with   Hypertension    [facility]    Subjective: Amy Rodriguez is a 89 y.o. female present for Chronic Conditions/illness Management   Hypertension/hyperlipidemia/overweight/tachycardia/CAD: Patient reports compliance with amlodipine 5 mg daily, Zocor 10 mg every other day, and losartan 12.5 mg.   Patient does have a small MCA aneurysm, SVT, history of stroke/tia and is followed by cardiology.  She had been on atenolol low-dose and this was discontinued  by cardiology.  She also been tried on lisinopril but noticed a tickle cough and changed to losartan.  Patient denies chest pain, shortness of breath, dizziness or lower extremity edema.  Baby aspirin- pt reports her heart doctor told her she did not need to continue   osteopenia, unspecified location Up-to-date 09/17/2020  B12 deficiency/ Vitamin D deficiency Patient reports she has continued to supplementation. Aneurysm (HCC)/Apical variant hypertrophic cardiomyopathy (HCC) Follows with cardiology  Hypothyroid: Patient reports compliance with levothyroxine 75 g daily.         10/25/2022    2:37 PM 11/26/2021   10:50 AM 07/06/2021   10:13 AM 01/19/2021    1:45 PM  03/23/2020    1:56 PM  Depression screen PHQ 2/9  Decreased Interest 0 0 0 0 0  Down, Depressed, Hopeless 1 0 0 0   PHQ - 2 Score 1 0 0 0 0    Allergies  Allergen Reactions   Benzonatate Nausea And Vomiting    "tessalon pearl"   Sulfonamide Derivatives Itching and Rash   Fosamax [Alendronate]    Hctz [Hydrochlorothiazide] Other (See Comments)    Severe hyponatremia    Lisinopril Cough   Social History   Tobacco Use   Smoking status: Former    Current packs/day: 0.00    Average packs/day: 1 pack/day for 25.0 years (25.0 ttl pk-yrs)    Types: Cigarettes    Start date: 04/18/1955    Quit date: 04/17/1980    Years since quitting: 42.7   Smokeless tobacco: Never   Tobacco comments:    passive smoker, husband smoked  Substance Use Topics   Alcohol use: No   Past Medical History:  Diagnosis Date   Arthritis    sed rate 10, RF, CCP pending   Blood in stool    Cervical cancer (HCC)    s/p hysterectomy/oop   DDD (degenerative disc disease), lumbosacral    Depression    Due to husbands passing away 09/22/2011   Diverticulosis of colon    Environmental allergies    GERD (gastroesophageal reflux disease)    H pylori ulcer    not treated due to expense  10/2001   Hand dermatitis    HTN (hypertension)    Hyperlipidemia    Hypothyroidism  TSH 14.488 (11/2005)   IBS (irritable bowel syndrome)    Lactose intolerance    Migraines    "get them very rarely now" (02/08/2012)   Multiple pigmented nevi    last derm evaluation 01/2003   OA (osteoarthritis)    multiple sites   Other seborrheic keratosis    Pain in joint, lower leg    Pneumonia    "couple times in my lifetime" (02/08/2012)   PONV (postoperative nausea and vomiting)    "and takes me a long time to come out under it" (02/08/2012)   Posterior vitreous detachment 1996   Postmenopausal    s/p hysterectomy for h/o cervical cancer 1982 (both ovaries taken at that time) now on hormonal replacement    Postmenopausal HRT  (hormone replacement therapy)    PPD positive    history +PPD 1984, no treatment, no abnormal CXR   Shortness of breath    with ambulation   Stroke [facility])    TIA- April 2016 last one   Supraventricular tachycardia, paroxysmal    Urinary frequency    Vaginal pruritus    Past Surgical History:  Procedure Laterality Date   ABDOMINAL HYSTERECTOMY  1982   CARDIOVASCULAR STRESS TEST  12/07/11   Normal nuclear stress test   CATARACT EXTRACTION W/ INTRAOCULAR LENS IMPLANT  ?1997   right   COLONOSCOPY     EYE SURGERY     cataract removal right eye   TOTAL ABDOMINAL HYSTERECTOMY W/ BILATERAL SALPINGOOPHORECTOMY  1982   TOTAL KNEE ARTHROPLASTY  02/08/2012   Procedure: TOTAL KNEE ARTHROPLASTY;  Surgeon: Velna Ochs, MD;  Location: [facility] OR;  Service: Orthopedics;  Laterality: Left;   Family History  Problem Relation Age of Onset   Breast cancer Mother 68   Lung cancer Brother 63   Cancer Brother        lung   Heart failure Father        congestive   Heart disease Father    Epilepsy Son    Kidney disease Son        tuberous sclerosis, both kidneys removed, has transplant   Diabetes Maternal Uncle    Diabetes Paternal Aunt    Cancer Maternal Grandmother        breast   Breast cancer Maternal Grandmother    Heart disease Son        [place]   COPD Son    Rectal cancer Maternal Aunt    Colon cancer Neg Hx    Esophageal cancer Neg Hx    Stomach cancer Neg Hx    Allergies as of 01/12/2023       Reactions   Benzonatate Nausea And Vomiting   "tessalon pearl"   Sulfonamide Derivatives Itching, Rash   Fosamax [alendronate]    Hctz [hydrochlorothiazide] Other (See Comments)   Severe hyponatremia    Lisinopril Cough        Medication List        Accurate as of January 12, 2023  1:24 PM. If you have any questions, ask your nurse or doctor.          albuterol 108 (90 Base) MCG/ACT inhaler Commonly known as: ProAir HFA Inhale 2 puffs into the lungs every 4  (four) hours as needed for wheezing or shortness of breath.   amLODipine 5 MG tablet Commonly known as: NORVASC Take 1 tablet (5 mg total) by mouth daily.   budesonide-formoterol 160-4.5 MCG/ACT inhaler Commonly known as: Symbicort Inhale 1 puff into the lungs  2 (two) times daily.   cholecalciferol 25 MCG (1000 UNIT) tablet Commonly known as: VITAMIN D3 Take 1,000 Units by mouth daily.   Cyanocobalamin 1000 MCG Lozg Take by mouth.   diclofenac sodium 1 % Gel Commonly known as: Voltaren Apply 2 g topically as needed.   fexofenadine 180 MG tablet Commonly known as: Allegra Allergy Take 1 tablet (180 mg total) by mouth daily.   levothyroxine 75 MCG tablet Commonly known as: SYNTHROID Take 1 tablet (75 mcg total) by mouth daily before breakfast.   losartan 25 MG tablet Commonly known as: COZAAR Take 0.5 tablets (12.5 mg total) by mouth daily.   simvastatin 10 MG tablet Commonly known as: ZOCOR Take 1 tablet (10 mg total) by mouth every evening.        No results found for this or any previous visit (from the past 24 hour(s)).  No results found.   ROS: Negative, with the exception of above mentioned in HPI   Objective:  BP 118/64   Pulse 63   Temp 97.8 F (36.6 C)   Wt 145 lb 3.2 oz (65.9 kg)   SpO2 93%   BMI 27.44 kg/m  Body mass index is 27.44 kg/m. Physical Exam Vitals and nursing note reviewed.  Constitutional:      General: She is not in acute distress.    Appearance: Normal appearance. She is not ill-appearing, toxic-appearing or diaphoretic.  HENT:     Head: Normocephalic and atraumatic.  Eyes:     General: No scleral icterus.       Right eye: No discharge.        Left eye: No discharge.     Extraocular Movements: Extraocular movements intact.     Conjunctiva/sclera: Conjunctivae normal.     Pupils: Pupils are equal, round, and reactive to light.  Cardiovascular:     Rate and Rhythm: Normal rate and regular rhythm.     Heart sounds: No  murmur heard. Pulmonary:     Effort: Pulmonary effort is normal. No respiratory distress.     Breath sounds: Normal breath sounds. No wheezing, rhonchi or rales.  Musculoskeletal:     Right lower leg: No edema.     Left lower leg: No edema.  Skin:    General: Skin is warm.     Findings: No rash.  Neurological:     Mental Status: She is alert and oriented to person, place, and time. Mental status is at baseline.     Motor: No weakness.     Gait: Gait normal.  Psychiatric:        Mood and Affect: Mood normal.        Behavior: Behavior normal.        Thought Content: Thought content normal.        Judgment: Judgment normal.     Assessment/Plan: SEYMONE FORLENZA is a 89 y.o. female present for OV for management of chronic medical conditions. SVT/TIA history/small MCA aneurysm/hypertension/hyperlipidemia/CVD/overweight Stable Continue amlodipine 5 mg daily. Continue losartan 12.5 mg daily. -Was on low-dose beta-blocker that has been discontinued secondary to decreased energy and low heart rate. Continue routine follow-ups with cardiology Labs: UTD, due next visit -Continue simvastatin 10 mg QD  hypothyroidism -Continue levothyroxine 75 mcg daily. refills will be provided in appropriate dose based on lab result today  Leg pain:  Left shin pain. Followed with ortho and hardware is stable.' Trial of low dose gabapentin 100 mg at bedtime  vitamin D deficiency/B12 deficiency: Currently taking vitamin  D 1000 units and B12 sublingual 1000 Labs greatly improved.   UTD labs  Aneurysm (HCC)/Apical variant hypertrophic cardiomyopathy (HCC) Follows w/ cardio  Influenza vaccine administered today  Return in about 7 months (around 07/29/2023) for cpe (20 min), Routine chronic condition follow-up.   Reviewed expectations re: course of current medical issues. Discussed self-management of symptoms. Outlined signs and symptoms indicating need for more acute intervention. Patient  verbalized understanding and all questions were answered. Patient received an After-Visit Summary.    Orders Placed This Encounter  Procedures   Flu Vaccine Trivalent High Dose (Fluad)   Meds ordered this encounter  Medications   amLODipine (NORVASC) 5 MG tablet    Sig: Take 1 tablet (5 mg total) by mouth daily.    Dispense:  90 tablet    Refill:  1   losartan (COZAAR) 25 MG tablet    Sig: Take 0.5 tablets (12.5 mg total) by mouth daily.    Dispense:  45 tablet    Refill:  1    If you could, would you please cut these in half for her. She has trouble doing it herself.     electronically signed by:  Felix Pacini, DO  [facility] - OR

## 2023-01-22 ENCOUNTER — Telehealth: Payer: Self-pay

## 2023-01-22 NOTE — Telephone Encounter (Signed)
Please advise 

## 2023-01-22 NOTE — Telephone Encounter (Signed)
[facility] has told patient they have reached out several times regarding refills and have not heard back. Patient was seen on 9/27 and was prescribed Gabapentin, [facility] says [facility] do not have prescription.  Prescription Request  01/22/2023  LOV: Visit date not found  What is the name of the medication or equipment? simvastatin (ZOCOR) 10 MG tablet  gabapentin (NEURONTIN) 100 MG capsule   Have you contacted your pharmacy to request a refill? Yes   Which pharmacy would you like this sent to?  [id number] [place], [place] - [address] [place] [address] Phone: [phone] Fax: [phone]  [phone] [place] [place] - [address] [place] [address] Phone: [phone] Fax: [phone]    Patient notified that their request is being sent to the clinical staff for review and that they should receive a response within 2 business days.   Please advise at Mobile There is no such number on file (mobile).

## 2023-01-23 MED ORDER — GABAPENTIN 100 MG PO CAPS: 100.0000 mg | ORAL_CAPSULE | Freq: Every day | ORAL | 1 refills | Status: DC

## 2023-01-23 MED ORDER — SIMVASTATIN 10 MG PO TABS
10.0000 mg | ORAL_TABLET | Freq: Every evening | ORAL | 3 refills | Status: DC
Start: 2023-01-23 — End: 2023-07-27

## 2023-01-23 NOTE — Telephone Encounter (Signed)
Spoke with patient regarding results/recommendations.  

## 2023-01-23 NOTE — Telephone Encounter (Signed)
Sent the medications again. They [facility] [place], I am not certain why they are not receiving them. I would advise her to ask for the medication specifically and not the # on the side of the bottle. Sometimes the # changes and pharmacy will only look up the # and not name of medication if the # only is requested

## 2023-02-01 ENCOUNTER — Encounter: Payer: Self-pay | Admitting: Pharmacist

## 2023-02-01 NOTE — Progress Notes (Signed)
Pharmacy Quality Measure Review  This patient is appearing on a report for being at risk of failing the adherence measure for hypertension (ACEi/ARB) medications this calendar year.   Medication: losartan 25mg  - take 0.5 tablets daily  Last fill date: 10/21/2022 for #45 = 90 day supply  Updated Rx was sent in by Dr Claiborne Billings 01/12/2023 but not picked up because patient states she has plenty on hand.  2024 fills for losartan - 05/03/2022 #15 = 30 DS; 07/28/2022 - #45 = 90 DS and 10/21/2022 - #45 = 90 DS  Current percent day covered is 81%.   Spoke with patient today. She reports that she has a few more days of losartan on hand. She will request refill in the next 2 or 3 days.  She endorses that she is taking losartan as prescribed 0.5 tablet every day.   Also verified patient did receive prescription for gabapentin and started 3 days ago.   Henrene Pastor, PharmD Clinical Pharmacist [facility]  Population Health [phone]

## 2023-02-15 ENCOUNTER — Ambulatory Visit
Admission: RE | Admit: 2023-02-15 | Discharge: 2023-02-15 | Disposition: A | Discharge status: Home / Self Care | Payer: 59 | Source: Ambulatory Visit | Attending: Family Medicine | Admitting: Family Medicine

## 2023-02-15 DIAGNOSIS — M816 Localized osteoporosis [Lequesne]: Secondary | ICD-10-CM

## 2023-02-15 DIAGNOSIS — M8588 Other specified disorders of bone density and structure, other site: Secondary | ICD-10-CM | POA: Diagnosis not present

## 2023-02-19 ENCOUNTER — Telehealth: Payer: Self-pay | Admitting: Family Medicine

## 2023-02-19 NOTE — Telephone Encounter (Signed)
Spoke with patient regarding results/recommendations.  

## 2023-02-19 NOTE — Telephone Encounter (Signed)
Please inform patient we have received the results to her bone density test. Lung density is still in the osteoporosis range in her spine, although some density did improve.  The density in her left hip/femur area has mildly decreased in density and is now in the osteoporosis range as well.  If patient desires, we can see if she is a candidate for Prolia injections since she did not tolerate the Fosamax.  Please ask patient if she is interested in Prolia injections, and if she is then please start process to see if we can get her approved.

## 2023-03-27 IMAGING — MG MM DIGITAL SCREENING BILAT W/ TOMO AND CAD
8 series · 8 of 24 positions shown · non-contrast
Comparison: Previous exam(s).

CLINICAL DATA: Screening.

EXAM:
DIGITAL SCREENING BILATERAL MAMMOGRAM WITH TOMOSYNTHESIS AND CAD
TECHNIQUE: Bilateral screening digital craniocaudal and mediolateral oblique
mammograms were obtained. Bilateral screening digital breast
tomosynthesis was performed. The images were evaluated with
computer-aided detection.

[L MLO synth-2D]
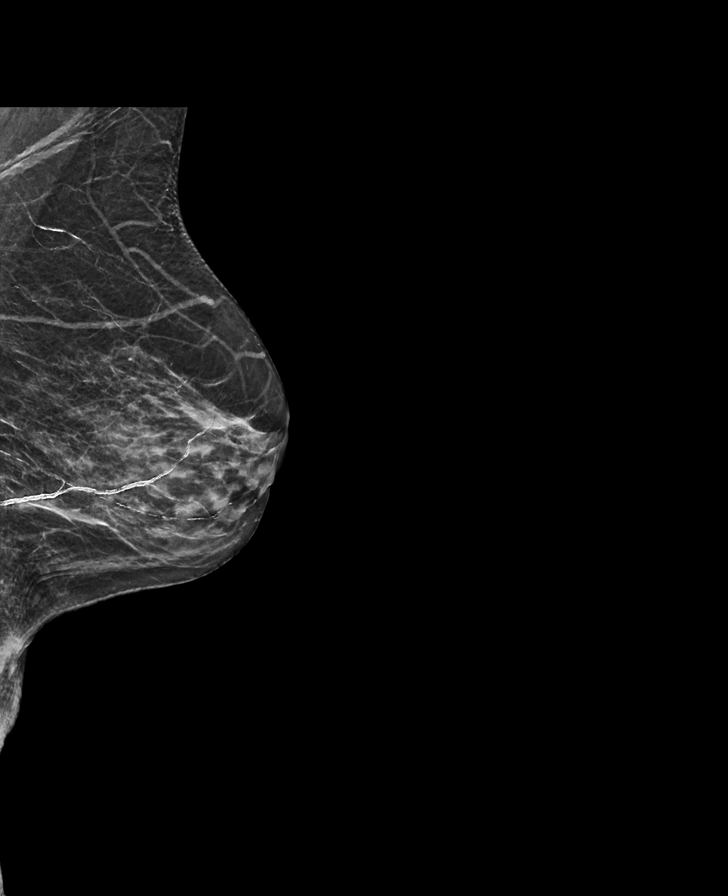

[L CC synth-2D]
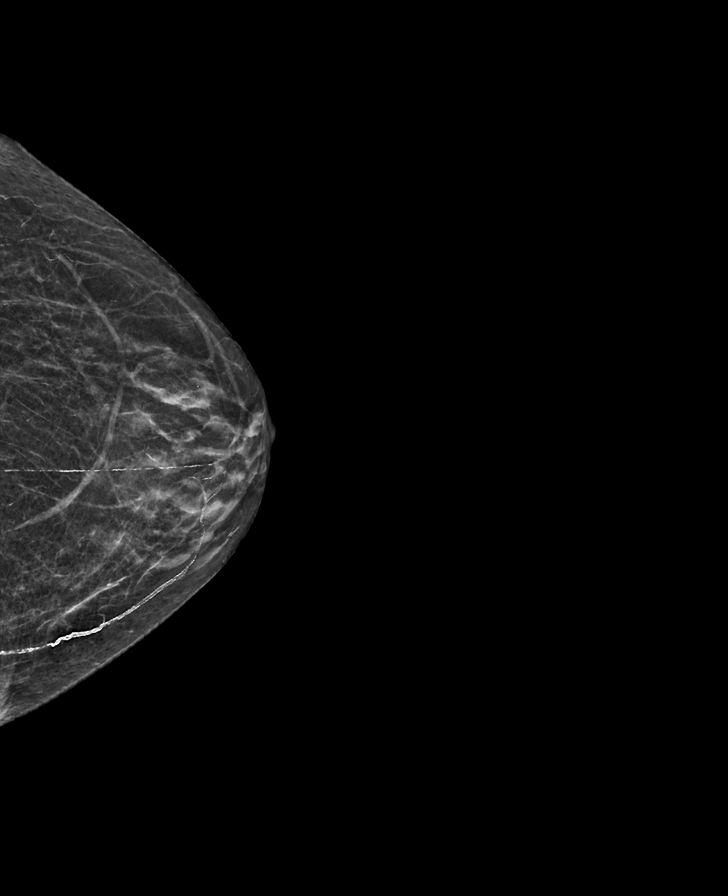

[R CC synth-2D]
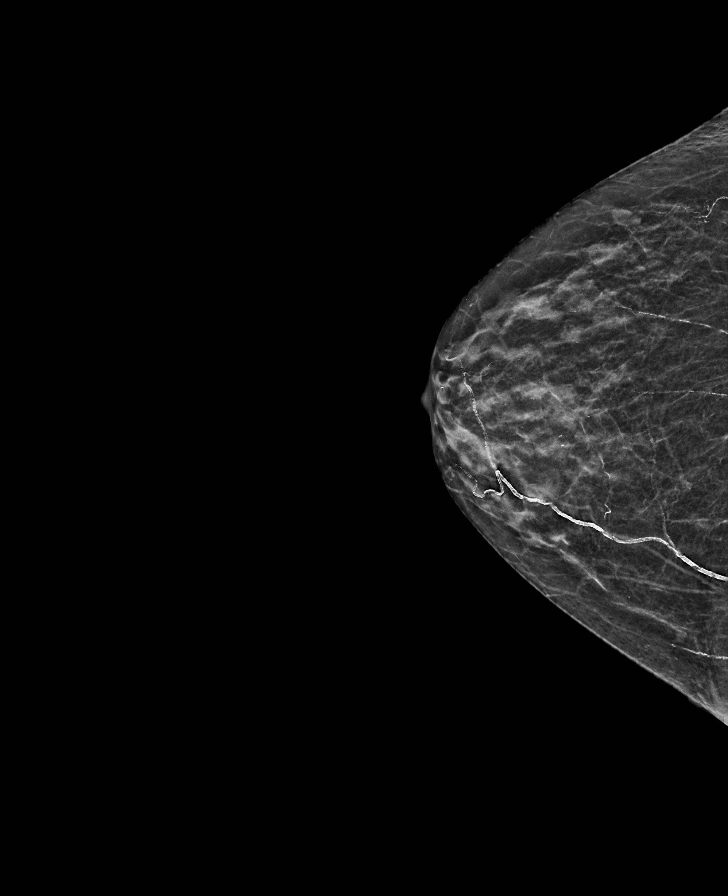

[R MLO synth-2D]
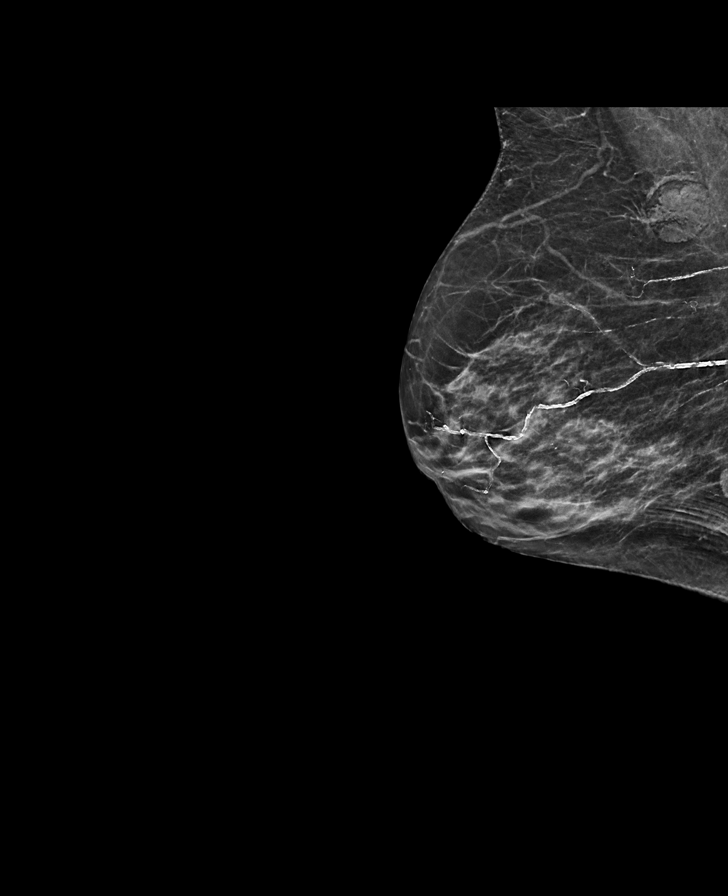

[L CC tomo · tomo slice 21/42.0]
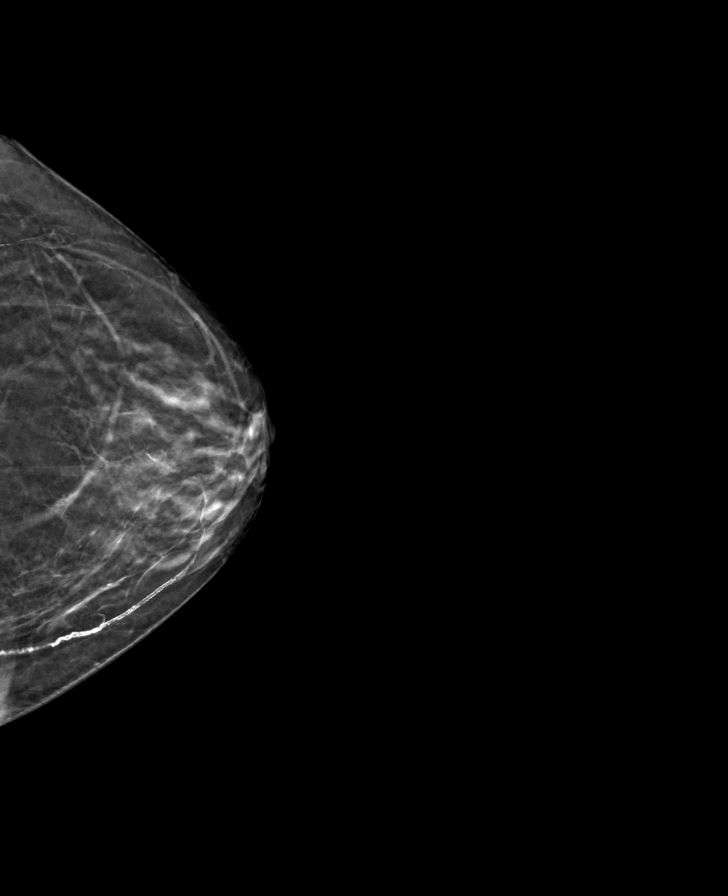

[R CC tomo · tomo slice 21/41.0]
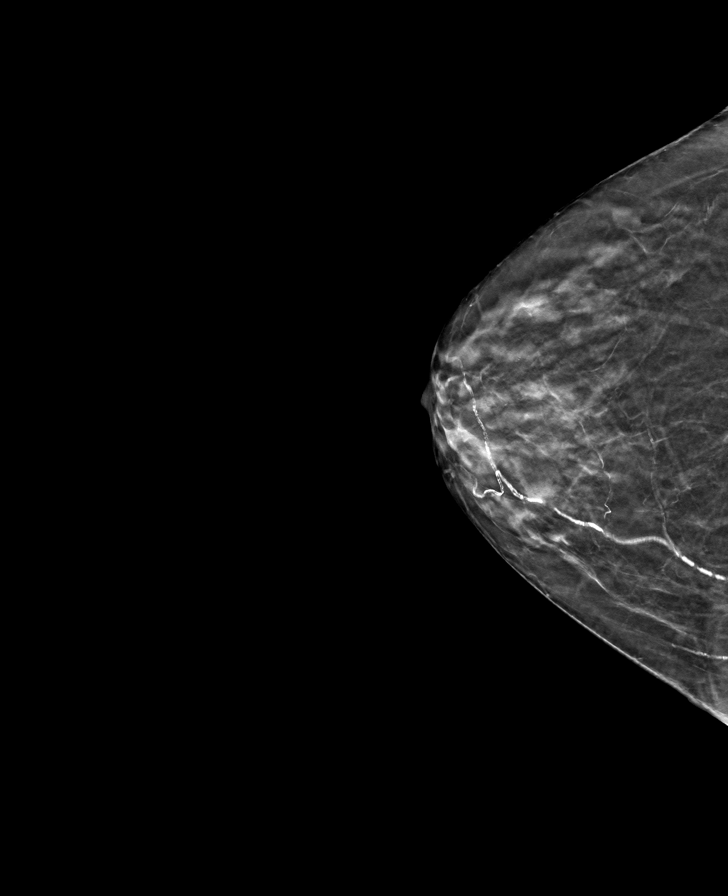

[L MLO tomo · tomo slice 25/49.0]
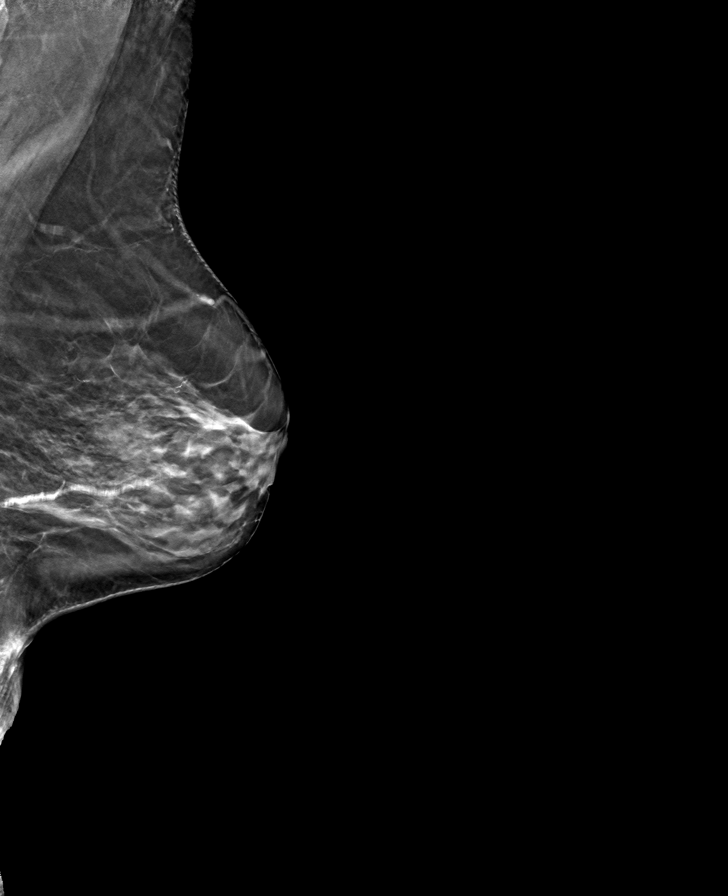

[R MLO tomo · tomo slice 25/49.0]
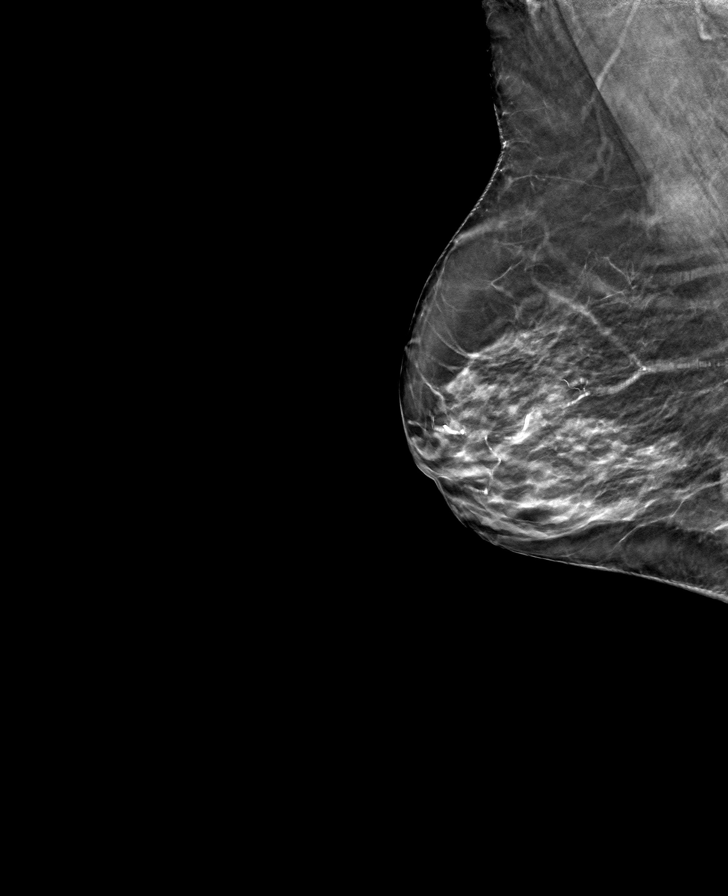

[8 of 24 positions shown; findings below may reference images not displayed]

ACR Breast Density Category c: The breast tissue is heterogeneously
dense, which may obscure small masses.
FINDINGS: There are no findings suspicious for malignancy.
IMPRESSION: No mammographic evidence of malignancy. A result letter of this
screening mammogram will be mailed directly to the patient.

RECOMMENDATION:
Screening mammogram in one year. (Code:Q3-W-BC3)

BI-RADS CATEGORY  1: Negative.

## 2023-04-30 ENCOUNTER — Other Ambulatory Visit: Payer: Self-pay | Admitting: Family Medicine

## 2023-05-18 ENCOUNTER — Other Ambulatory Visit: Payer: Self-pay | Admitting: Family Medicine

## 2023-07-09 ENCOUNTER — Telehealth: Admitting: Family Medicine

## 2023-07-09 ENCOUNTER — Telehealth: Admitting: Physician Assistant

## 2023-07-09 DIAGNOSIS — B9689 Other specified bacterial agents as the cause of diseases classified elsewhere: Secondary | ICD-10-CM | POA: Diagnosis not present

## 2023-07-09 DIAGNOSIS — R053 Chronic cough: Secondary | ICD-10-CM

## 2023-07-09 DIAGNOSIS — J441 Chronic obstructive pulmonary disease with (acute) exacerbation: Secondary | ICD-10-CM

## 2023-07-09 DIAGNOSIS — J208 Acute bronchitis due to other specified organisms: Secondary | ICD-10-CM

## 2023-07-09 MED ORDER — AZITHROMYCIN 250 MG PO TABS: ORAL_TABLET | ORAL | 0 refills | Status: AC

## 2023-07-09 MED ORDER — PREDNISONE 20 MG PO TABS: 40.0000 mg | ORAL_TABLET | Freq: Every day | ORAL | 0 refills | Status: DC

## 2023-07-09 MED ORDER — PROMETHAZINE-DM 6.25-15 MG/5ML PO SYRP: 2.5000 mL | ORAL_SOLUTION | Freq: Three times a day (TID) | ORAL | 0 refills | Status: DC | PRN

## 2023-07-09 NOTE — Patient Instructions (Signed)
 Amy Rodriguez, thank you for joining Margaretann Loveless, PA-C for today's virtual visit.  While this provider is not your primary care provider (PCP), if your PCP is located in our provider database this encounter information will be shared with them immediately following your visit.   A [facility] MyChart account gives you access to today's visit and all your visits, tests, and labs performed at [facility] " click here if you don't have a [facility] MyChart account or go to [email or web address]  Consent: (Patient) Amy Rodriguez provided verbal consent for this virtual visit at the beginning of the encounter.  Current Medications:  Current Outpatient Medications:    azithromycin (ZITHROMAX) 250 MG tablet, Take 2 tablets on day 1, then 1 tablet daily on days 2 through 5, Disp: 6 tablet, Rfl: 0   predniSONE (DELTASONE) 20 MG tablet, Take 2 tablets (40 mg total) by mouth daily with breakfast., Disp: 14 tablet, Rfl: 0   promethazine-dextromethorphan (PROMETHAZINE-DM) 6.25-15 MG/5ML syrup, Take 2.5 mLs by mouth every 8 (eight) hours as needed for cough., Disp: 118 mL, Rfl: 0   albuterol (PROAIR HFA) 108 (90 Base) MCG/ACT inhaler, Inhale 2 puffs into the lungs every 4 (four) hours as needed for wheezing or shortness of breath., Disp: 1 each, Rfl: 3   amLODipine (NORVASC) 5 MG tablet, Take 1 tablet (5 mg total) by mouth daily., Disp: 90 tablet, Rfl: 1   budesonide-formoterol (SYMBICORT) 160-4.5 MCG/ACT inhaler, Inhale 1 puff into the lungs 2 (two) times daily., Disp: 6 g, Rfl: 11   cholecalciferol (VITAMIN D3) 25 MCG (1000 UNIT) tablet, Take 1,000 Units by mouth daily., Disp: , Rfl:    Cyanocobalamin 1000 MCG LOZG, Take by mouth., Disp: , Rfl:    diclofenac sodium (VOLTAREN) 1 % GEL, Apply 2 g topically as needed., Disp: 100 g, Rfl: 11   fexofenadine (ALLEGRA ALLERGY) 180 MG tablet, Take 1 tablet (180 mg total) by mouth daily., Disp: 90 tablet, Rfl: 3   gabapentin (NEURONTIN)  100 MG capsule, Take 1 capsule (100 mg total) by mouth at bedtime., Disp: 90 capsule, Rfl: 1   levothyroxine (SYNTHROID) 75 MCG tablet, Take 1 tablet (75 mcg total) by mouth daily before breakfast., Disp: 90 tablet, Rfl: 3   losartan (COZAAR) 25 MG tablet, Take 0.5 tablets (12.5 mg total) by mouth daily., Disp: 45 tablet, Rfl: 1   simvastatin (ZOCOR) 10 MG tablet, Take 1 tablet (10 mg total) by mouth every evening., Disp: 45 tablet, Rfl: 3   Medications ordered in this encounter:  Meds ordered this encounter  Medications   azithromycin (ZITHROMAX) 250 MG tablet    Sig: Take 2 tablets on day 1, then 1 tablet daily on days 2 through 5    Dispense:  6 tablet    Refill:  0    Supervising Provider:   Merrilee Jansky [[id number]]   predniSONE (DELTASONE) 20 MG tablet    Sig: Take 2 tablets (40 mg total) by mouth daily with breakfast.    Dispense:  14 tablet    Refill:  0    Supervising Provider:   Merrilee Jansky [[id number]]   promethazine-dextromethorphan (PROMETHAZINE-DM) 6.25-15 MG/5ML syrup    Sig: Take 2.5 mLs by mouth every 8 (eight) hours as needed for cough.    Dispense:  118 mL    Refill:  0    Supervising Provider:   Merrilee Jansky [id number]     *If you need refills on other medications prior to your  next appointment, please contact your pharmacy*  Follow-Up: Call back or seek an in-person evaluation if the symptoms worsen or if the condition fails to improve as anticipated.  [facility] [phone]  Other Instructions Acute Bronchitis, Adult  Acute bronchitis is sudden inflammation of the main airways (bronchi) that come off the windpipe (trachea) in the lungs. The swelling causes the airways to get smaller and make more mucus than normal. This can make it hard to breathe and can cause coughing or noisy breathing (wheezing). Acute bronchitis may last several weeks. The cough may last longer. Allergies, asthma, and exposure to smoke may make the condition  worse. What are the causes? This condition can be caused by germs and by substances that irritate the lungs, including: Cold and flu viruses. The most common cause of this condition is the virus that causes the common cold. Bacteria. This is less common. Breathing in substances that irritate the lungs, including: Smoke from cigarettes and other forms of tobacco. Dust and pollen. Fumes from household cleaning products, gases, or burned fuel. Indoor or outdoor air pollution. What increases the risk? The following factors may make you more likely to develop this condition: A weak body's defense system, also called the immune system. A condition that affects your lungs and breathing, such as asthma. What are the signs or symptoms? Common symptoms of this condition include: Coughing. This may bring up clear, yellow, or green mucus from your lungs (sputum). Wheezing. Runny or stuffy nose. Having too much mucus in your lungs (chest congestion). Shortness of breath. Aches and pains, including sore throat or chest. How is this diagnosed? This condition is usually diagnosed based on: Your symptoms and medical history. A physical exam. You may also have other tests, including tests to rule out other conditions, such as pneumonia. These tests include: A test of lung function. Test of a mucus sample to look for the presence of bacteria. Tests to check the oxygen level in your blood. Blood tests. Chest X-ray. How is this treated? Most cases of acute bronchitis clear up over time without treatment. Your health care provider may recommend: Drinking more fluids to help thin your mucus so it is easier to cough up. Taking inhaled medicine (inhaler) to improve air flow in and out of your lungs. Using a vaporizer or a humidifier. These are machines that add water to the air to help you breathe better. Taking a medicine that thins mucus and clears congestion (expectorant). Taking a medicine that  prevents or stops coughing (cough suppressant). It is not common to take an antibiotic medicine for this condition. Follow these instructions at home:  Take over-the-counter and prescription medicines only as told by your health care provider. Use an inhaler, vaporizer, or humidifier as told by your health care provider. Take two teaspoons (10 mL) of honey at bedtime to lessen coughing at night. Drink enough fluid to keep your urine pale yellow. Do not use any products that contain nicotine or tobacco. These products include cigarettes, chewing tobacco, and vaping devices, such as e-cigarettes. If you need help quitting, ask your health care provider. Get plenty of rest. Return to your normal activities as told by your health care provider. Ask your health care provider what activities are safe for you. Keep all follow-up visits. This is important. How is this prevented? To lower your risk of getting this condition again: Wash your hands often with soap and water for at least 20 seconds. If soap and water are  not available, use hand sanitizer. Avoid contact with people who have cold symptoms. Try not to touch your mouth, nose, or eyes with your hands. Avoid breathing in smoke or chemical fumes. Breathing smoke or chemical fumes will make your condition worse. Get the flu shot every year. Contact a health care provider if: Your symptoms do not improve after 2 weeks. You have trouble coughing up the mucus. Your cough keeps you awake at night. You have a fever. Get help right away if you: Cough up blood. Feel pain in your chest. Have severe shortness of breath. Faint or keep feeling like you are going to faint. Have a severe headache. Have a fever or chills that get worse. These symptoms may represent a serious problem that is an emergency. Do not wait to see if the symptoms will go away. Get medical help right away. Call your local emergency services (911 in the [place] Do not drive  yourself to the hospital. Summary Acute bronchitis is inflammation of the main airways (bronchi) that come off the windpipe (trachea) in the lungs. The swelling causes the airways to get smaller and make more mucus than normal. Drinking more fluids can help thin your mucus so it is easier to cough up. Take over-the-counter and prescription medicines only as told by your health care provider. Do not use any products that contain nicotine or tobacco. These products include cigarettes, chewing tobacco, and vaping devices, such as e-cigarettes. If you need help quitting, ask your health care provider. Contact a health care provider if your symptoms do not improve after 2 weeks. This information is not intended to replace advice given to you by your health care provider. Make sure you discuss any questions you have with your health care provider. Document Revised: 07/14/2021 Document Reviewed: 08/04/2020 Elsevier Patient Education  2024 Elsevier Inc.   If you have been instructed to have an in-person evaluation today at a local Urgent Care facility, please use the link below. It will take you to a list of all of our available [facility], including address, phone number and hours of operation. Please do not delay care.  [facility]  If you or a family member do not have a primary care provider, use the link below to schedule a visit and establish care. When you choose a [facility] primary care physician or advanced practice provider, you gain a long-term partner in health. Find a Primary Care Provider  Learn more about [facility] in-office and virtual care options: [facility] - Get Care Now

## 2023-07-09 NOTE — Progress Notes (Signed)
 Virtual Visit Consent   Amy Rodriguez, you are scheduled for a virtual visit with a [facility] provider today. Just as with appointments in the office, your consent must be obtained to participate. Your consent will be active for this visit and any virtual visit you may have with one of our providers in the next 365 days. If you have a MyChart account, a copy of this consent can be sent to you electronically.  As this is a virtual visit, video technology does not allow for your provider to perform a traditional examination. This may limit your provider's ability to fully assess your condition. If your provider identifies any concerns that need to be evaluated in person or the need to arrange testing (such as labs, EKG, etc.), we will make arrangements to do so. Although advances in technology are sophisticated, we cannot ensure that it will always work on either your end or our end. If the connection with a video visit is poor, the visit may have to be switched to a telephone visit. With either a video or telephone visit, we are not always able to ensure that we have a secure connection.  By engaging in this virtual visit, you consent to the provision of healthcare and authorize for your insurance to be billed (if applicable) for the services provided during this visit. Depending on your insurance coverage, you may receive a charge related to this service.  I need to obtain your verbal consent now. Are you willing to proceed with your visit today? Amy Rodriguez has provided verbal consent on 07/09/2023 for a virtual visit (video or telephone). Margaretann Loveless, PA-C  Date: 07/09/2023 5:43 PM   Virtual Visit via Video Note   I, Margaretann Loveless, connected with  Amy Rodriguez  ([id number], Jun 20, 1933) on 07/09/23 at  5:30 PM EDT by a video-enabled telemedicine application and verified that I am speaking with the correct person using two identifiers.  Location: Patient: Virtual Visit  Location Patient: Home Provider: Virtual Visit Location Provider: Home Office   I discussed the limitations of evaluation and management by telemedicine and the availability of in person appointments. The patient expressed understanding and agreed to proceed.    History of Present Illness: Amy Rodriguez is a 89 y.o. who identifies as a female who was assigned female at birth, and is being seen today for cough and congestion.  HPI: URI  This is a new problem. The current episode started 1 to 4 weeks ago (Tuesday, 07/03/23). The problem has been gradually worsening. There has been no fever. Associated symptoms include congestion, coughing, diarrhea, ear pain (right), headaches, rhinorrhea and a sore throat (scratchy from mouth breathing and drainage). Pertinent negatives include no chest pain, nausea, plugged ear sensation, sinus pain, vomiting or wheezing. Associated symptoms comments: Mild SOB with coughing fits, albuterol helps. She has tried inhaler use and NSAIDs (albuterol, breeathing strips at night for nasal congestion, ibuprofen) for the symptoms. The treatment provided no relief.  Son had symptoms initially a week ago, had to be hospitalized and was diagnosed with the flu. She had stayed with him in the mornings while he was there. He is a kidney transplant patient.   Problems:  Patient Active Problem List   Diagnosis Date Noted   PFO (patent foramen ovale) 10/09/2019   Apical variant hypertrophic cardiomyopathy (HCC) 10/09/2019   Chronic systolic dysfunction of right ventricle 10/09/2019   Right thyroid enlargement 02/06/2019   Aneurysm (HCC) 05/01/2016   Osteoporosis  08/03/2015   Venous stasis 07/07/2015   B12 deficiency 07/08/2014   Prediabetes 07/08/2014   Hyperlipidemia 07/08/2014   COPD (chronic obstructive pulmonary disease) (HCC) 04/29/2014   Simple partial seizure with special sensory symptoms (HCC) 11/04/2013   Cerebrovascular disease 08/21/2013   TIA (transient  ischemic attack) 07/25/2013   Hypothyroidism 02/13/2012    Class: Chronic   Vitamin D deficiency 06/28/2009   HYPERTENSION, BENIGN ESSENTIAL 06/03/2008   TACHYCARDIA, PAROXYSMAL SUPRAVENTRICULAR 06/14/2006   Degenerative arthritis of knee, bilateral 06/14/2006   DDD (degenerative disc disease), lumbosacral 06/14/2006    Allergies:  Allergies  Allergen Reactions   Benzonatate Nausea And Vomiting    "tessalon pearl"   Sulfonamide Derivatives Itching and Rash   Fosamax [Alendronate]    Hctz [Hydrochlorothiazide] Other (See Comments)    Severe hyponatremia    Lisinopril Cough   Medications:  Current Outpatient Medications:    azithromycin (ZITHROMAX) 250 MG tablet, Take 2 tablets on day 1, then 1 tablet daily on days 2 through 5, Disp: 6 tablet, Rfl: 0   predniSONE (DELTASONE) 20 MG tablet, Take 2 tablets (40 mg total) by mouth daily with breakfast., Disp: 14 tablet, Rfl: 0   promethazine-dextromethorphan (PROMETHAZINE-DM) 6.25-15 MG/5ML syrup, Take 2.5 mLs by mouth every 8 (eight) hours as needed for cough., Disp: 118 mL, Rfl: 0   albuterol (PROAIR HFA) 108 (90 Base) MCG/ACT inhaler, Inhale 2 puffs into the lungs every 4 (four) hours as needed for wheezing or shortness of breath., Disp: 1 each, Rfl: 3   amLODipine (NORVASC) 5 MG tablet, Take 1 tablet (5 mg total) by mouth daily., Disp: 90 tablet, Rfl: 1   budesonide-formoterol (SYMBICORT) 160-4.5 MCG/ACT inhaler, Inhale 1 puff into the lungs 2 (two) times daily., Disp: 6 g, Rfl: 11   cholecalciferol (VITAMIN D3) 25 MCG (1000 UNIT) tablet, Take 1,000 Units by mouth daily., Disp: , Rfl:    Cyanocobalamin 1000 MCG LOZG, Take by mouth., Disp: , Rfl:    diclofenac sodium (VOLTAREN) 1 % GEL, Apply 2 g topically as needed., Disp: 100 g, Rfl: 11   fexofenadine (ALLEGRA ALLERGY) 180 MG tablet, Take 1 tablet (180 mg total) by mouth daily., Disp: 90 tablet, Rfl: 3   gabapentin (NEURONTIN) 100 MG capsule, Take 1 capsule (100 mg total) by mouth at  bedtime., Disp: 90 capsule, Rfl: 1   levothyroxine (SYNTHROID) 75 MCG tablet, Take 1 tablet (75 mcg total) by mouth daily before breakfast., Disp: 90 tablet, Rfl: 3   losartan (COZAAR) 25 MG tablet, Take 0.5 tablets (12.5 mg total) by mouth daily., Disp: 45 tablet, Rfl: 1   simvastatin (ZOCOR) 10 MG tablet, Take 1 tablet (10 mg total) by mouth every evening., Disp: 45 tablet, Rfl: 3  Observations/Objective: Patient is well-developed, well-nourished in no acute distress.  Resting comfortably at home.  Head is normocephalic, atraumatic.  No labored breathing.  Speech is clear and coherent with logical content.  Patient is alert and oriented at baseline.  Deep, harsh, bronchial cough heard once during call, not affecting speech  Assessment and Plan: 1. Acute bacterial bronchitis (Primary) - azithromycin (ZITHROMAX) 250 MG tablet; Take 2 tablets on day 1, then 1 tablet daily on days 2 through 5  Dispense: 6 tablet; Refill: 0 - predniSONE (DELTASONE) 20 MG tablet; Take 2 tablets (40 mg total) by mouth daily with breakfast.  Dispense: 14 tablet; Refill: 0 - promethazine-dextromethorphan (PROMETHAZINE-DM) 6.25-15 MG/5ML syrup; Take 2.5 mLs by mouth every 8 (eight) hours as needed for cough.  Dispense: 118  mL; Refill: 0  2. COPD exacerbation (HCC) - azithromycin (ZITHROMAX) 250 MG tablet; Take 2 tablets on day 1, then 1 tablet daily on days 2 through 5  Dispense: 6 tablet; Refill: 0 - predniSONE (DELTASONE) 20 MG tablet; Take 2 tablets (40 mg total) by mouth daily with breakfast.  Dispense: 14 tablet; Refill: 0 - promethazine-dextromethorphan (PROMETHAZINE-DM) 6.25-15 MG/5ML syrup; Take 2.5 mLs by mouth every 8 (eight) hours as needed for cough.  Dispense: 118 mL; Refill: 0  - Suspect progression of Influenza to bacterial bronchitis and COPD exacerbation - Worsening over a week despite OTC medications - Will treat with Z-pack and Prednisone - Promethazine DM for cough, drowsiness precautions  discussed and advised may need only at bedtime - Can continue Mucinex during the day - Push fluids.  - Rest.  - Steam and humidifier can help - Seek in person evaluation if worsening or symptoms fail to improve    Follow Up Instructions: I discussed the assessment and treatment plan with the patient. The patient was provided an opportunity to ask questions and all were answered. The patient agreed with the plan and demonstrated an understanding of the instructions.  A copy of instructions were sent to the patient via MyChart unless otherwise noted below.    The patient was advised to call back or seek an in-person evaluation if the symptoms worsen or if the condition fails to improve as anticipated.    Margaretann Loveless, PA-C

## 2023-07-09 NOTE — Progress Notes (Signed)
  Thank you for the details you included in the comment boxes. Those details are very helpful in determining the best course of treatment for you and help [place] to provide the best care.Because you have COPD and some worrisome symptoms, we recommend that you schedule a Virtual Urgent Care video visit in order for the provider to better assess what is going on.  The provider will be able to give you a more accurate diagnosis and treatment plan if we can more freely discuss your symptoms and with the addition of a virtual examination.   If you change your visit to a video visit, we will bill your insurance (similar to an office visit) and you will not be charged for this e-Visit. You will be able to stay at home and speak with the first available [facility] advanced practice provider. The link to do a video visit is in the drop down Menu tab of your Welcome screen in MyChart.

## 2023-07-20 ENCOUNTER — Telehealth: Admitting: Family Medicine

## 2023-07-20 DIAGNOSIS — K1379 Other lesions of oral mucosa: Secondary | ICD-10-CM

## 2023-07-20 DIAGNOSIS — R197 Diarrhea, unspecified: Secondary | ICD-10-CM | POA: Diagnosis not present

## 2023-07-20 NOTE — Progress Notes (Signed)
 Virtual Visit Consent   [name], you are scheduled for a virtual visit with a [facility] provider today. Just as with appointments in the office, your consent must be obtained to participate. Your consent will be active for this visit and any virtual visit you may have with one of our providers in the next 365 days. If you have a MyChart account, a copy of this consent can be sent to you electronically.  As this is a virtual visit, video technology does not allow for your provider to perform a traditional examination. This may limit your provider's ability to fully assess your condition. If your provider identifies any concerns that need to be evaluated in person or the need to arrange testing (such as labs, EKG, etc.), we will make arrangements to do so. Although advances in technology are sophisticated, we cannot ensure that it will always work on either your end or our end. If the connection with a video visit is poor, the visit may have to be switched to a telephone visit. With either a video or telephone visit, we are not always able to ensure that we have a secure connection.  By engaging in this virtual visit, you consent to the provision of healthcare and authorize for your insurance to be billed (if applicable) for the services provided during this visit. Depending on your insurance coverage, you may receive a charge related to this service.  I need to obtain your verbal consent now. Are you willing to proceed with your visit today? Amy Rodriguez has provided verbal consent on 07/20/2023 for a virtual visit (video or telephone). Georgana Curio, FNP  Date: 07/20/2023 5:30 PM   Virtual Visit via Video Note   I, Georgana Curio, connected with  Amy Rodriguez  ([id number], 1933-07-01) on 07/20/23 at  5:30 PM EDT by a video-enabled telemedicine application and verified that I am speaking with the correct person using two identifiers.  Location: Patient: Virtual Visit Location Patient:  Home Provider: Virtual Visit Location Provider: Home Office   I discussed the limitations of evaluation and management by telemedicine and the availability of in person appointments. The patient expressed understanding and agreed to proceed.    History of Present Illness: Amy Rodriguez is a 89 y.o. who identifies as a female who was assigned female at birth, and is being seen today for sore mouth and Gi upset since she was on an antibiotic, prednisone and cough meds. She just finished meds. Cough is improved and only occasional. No nausea or vomiting. Marland Kitchen  HPI: HPI  Problems:  Patient Active Problem List   Diagnosis Date Noted   PFO (patent foramen ovale) 10/09/2019   Apical variant hypertrophic cardiomyopathy (HCC) 10/09/2019   Chronic systolic dysfunction of right ventricle 10/09/2019   Right thyroid enlargement 02/06/2019   Aneurysm (HCC) 05/01/2016   Osteoporosis 08/03/2015   Venous stasis 07/07/2015   B12 deficiency 07/08/2014   Prediabetes 07/08/2014   Hyperlipidemia 07/08/2014   COPD (chronic obstructive pulmonary disease) (HCC) 04/29/2014   Simple partial seizure with special sensory symptoms (HCC) 11/04/2013   Cerebrovascular disease 08/21/2013   TIA (transient ischemic attack) 07/25/2013   Hypothyroidism 02/13/2012    Class: Chronic   Vitamin D deficiency 06/28/2009   HYPERTENSION, BENIGN ESSENTIAL 06/03/2008   TACHYCARDIA, PAROXYSMAL SUPRAVENTRICULAR 06/14/2006   Degenerative arthritis of knee, bilateral 06/14/2006   DDD (degenerative disc disease), lumbosacral 06/14/2006    Allergies:  Allergies  Allergen Reactions   Benzonatate Nausea And Vomiting    "  tessalon pearl"   Sulfonamide Derivatives Itching and Rash   Fosamax [Alendronate]    Hctz [Hydrochlorothiazide] Other (See Comments)    Severe hyponatremia    Lisinopril Cough   Medications:  Current Outpatient Medications:    albuterol (PROAIR HFA) 108 (90 Base) MCG/ACT inhaler, Inhale 2 puffs into the lungs  every 4 (four) hours as needed for wheezing or shortness of breath., Disp: 1 each, Rfl: 3   amLODipine (NORVASC) 5 MG tablet, Take 1 tablet (5 mg total) by mouth daily., Disp: 90 tablet, Rfl: 1   budesonide-formoterol (SYMBICORT) 160-4.5 MCG/ACT inhaler, Inhale 1 puff into the lungs 2 (two) times daily., Disp: 6 g, Rfl: 11   cholecalciferol (VITAMIN D3) 25 MCG (1000 UNIT) tablet, Take 1,000 Units by mouth daily., Disp: , Rfl:    Cyanocobalamin 1000 MCG LOZG, Take by mouth., Disp: , Rfl:    diclofenac sodium (VOLTAREN) 1 % GEL, Apply 2 g topically as needed., Disp: 100 g, Rfl: 11   fexofenadine (ALLEGRA ALLERGY) 180 MG tablet, Take 1 tablet (180 mg total) by mouth daily., Disp: 90 tablet, Rfl: 3   gabapentin (NEURONTIN) 100 MG capsule, Take 1 capsule (100 mg total) by mouth at bedtime., Disp: 90 capsule, Rfl: 1   levothyroxine (SYNTHROID) 75 MCG tablet, Take 1 tablet (75 mcg total) by mouth daily before breakfast., Disp: 90 tablet, Rfl: 3   losartan (COZAAR) 25 MG tablet, Take 0.5 tablets (12.5 mg total) by mouth daily., Disp: 45 tablet, Rfl: 1   predniSONE (DELTASONE) 20 MG tablet, Take 2 tablets (40 mg total) by mouth daily with breakfast., Disp: 14 tablet, Rfl: 0   promethazine-dextromethorphan (PROMETHAZINE-DM) 6.25-15 MG/5ML syrup, Take 2.5 mLs by mouth every 8 (eight) hours as needed for cough., Disp: 118 mL, Rfl: 0   simvastatin (ZOCOR) 10 MG tablet, Take 1 tablet (10 mg total) by mouth every evening., Disp: 45 tablet, Rfl: 3  Observations/Objective: Patient is well-developed, well-nourished in no acute distress.  Resting comfortably  at home.  Head is normocephalic, atraumatic.  No labored breathing.  Speech is clear and coherent with logical content.  Patient is alert and oriented at baseline.    Assessment and Plan: 1. Diarrhea, unspecified type (Primary)  2. Mouth sore  Increase fluids, no milk or dairy, start probiotic and follow up with urgent care if sx persist or  worsen.  Follow Up Instructions: I discussed the assessment and treatment plan with the patient. The patient was provided an opportunity to ask questions and all were answered. The patient agreed with the plan and demonstrated an understanding of the instructions.  A copy of instructions were sent to the patient via MyChart unless otherwise noted below.     The patient was advised to call back or seek an in-person evaluation if the symptoms worsen or if the condition fails to improve as anticipated.    Georgana Curio, FNP

## 2023-07-20 NOTE — Patient Instructions (Signed)
 Diarrhea, Adult Diarrhea is frequent loose and sometimes watery bowel movements. Diarrhea can make you feel weak and cause you to become dehydrated. Dehydration is a condition in which there is not enough water or other fluids in the body. Dehydration can make you tired and thirsty, cause you to have a dry mouth, and decrease how often you urinate. Diarrhea typically lasts 2-3 days. However, it can last longer if it is a sign of something more serious. It is important to treat your diarrhea as told by your health care provider. Follow these instructions at home: Eating and drinking     Follow these recommendations as told by your health care provider: Take an oral rehydration solution (ORS). This is an over-the-counter medicine that helps return your body to its normal balance of nutrients and water. It is found at pharmacies and retail stores. Drink enough fluid to keep your urine pale yellow. Drink fluids such as water, diluted fruit juice, and low-calorie sports drinks. You can drink milk also, if desired. Sucking on ice chips is another way to get fluids. Avoid drinking fluids that contain a lot of sugar or caffeine, such as soda, energy drinks, and regular sports drinks. Avoid alcohol. Eat bland, easy-to-digest foods in small amounts as you are able. These foods include bananas, applesauce, rice, lean meats, toast, and crackers. Avoid spicy or fatty foods.  Medicines Take over-the-counter and prescription medicines only as told by your health care provider. If you were prescribed antibiotics, take them as told by your health care provider. Do not stop using the antibiotic even if you start to feel better. General instructions  Wash your hands often using soap and water for at least 20 seconds. If soap and water are not available, use hand sanitizer. Others in the household should wash their hands as well. Hands should be washed: After using the toilet or changing a diaper. Before  preparing, cooking, or serving food. While caring for a sick person or while visiting someone in a hospital. Rest at home while you recover. Take a warm bath to relieve any burning or pain from frequent diarrhea episodes. Watch your condition for any changes. Contact a health care provider if: You have a fever. Your diarrhea gets worse. You have new symptoms. You vomit every time you eat or drink. You feel light-headed, dizzy, or have a headache. You have muscle cramps. You have signs of dehydration, such as: Dark urine, very little urine, or no urine. Cracked lips. Dry mouth. Sunken eyes. Sleepiness. Weakness. You have bloody or black stools or stools that look like tar. You have severe pain, cramping, or bloating in your abdomen. Your skin feels cold and clammy. You feel confused. Get help right away if: You have chest pain or your heart is beating very quickly. You have trouble breathing or you are breathing very quickly. You feel extremely weak or you faint. These symptoms may be an emergency. Get help right away. Call 911. Do not wait to see if the symptoms will go away. Do not drive yourself to the hospital. This information is not intended to replace advice given to you by your health care provider. Make sure you discuss any questions you have with your health care provider. Document Revised: 09/20/2021 Document Reviewed: 09/20/2021 Elsevier Patient Education  2024 [employer].

## 2023-07-23 ENCOUNTER — Other Ambulatory Visit: Payer: Self-pay | Admitting: Family Medicine

## 2023-07-30 ENCOUNTER — Encounter: Payer: Self-pay | Admitting: Family Medicine

## 2023-07-30 ENCOUNTER — Ambulatory Visit (INDEPENDENT_AMBULATORY_CARE_PROVIDER_SITE_OTHER): Payer: 59 | Admitting: Family Medicine

## 2023-07-30 VITALS — BP 120/60 | HR 62 | Temp 97.6°F | Ht 61.0 in | Wt 138.4 lb

## 2023-07-30 DIAGNOSIS — Z9181 History of falling: Secondary | ICD-10-CM

## 2023-07-30 DIAGNOSIS — R7303 Prediabetes: Secondary | ICD-10-CM

## 2023-07-30 DIAGNOSIS — I729 Aneurysm of unspecified site: Secondary | ICD-10-CM

## 2023-07-30 DIAGNOSIS — E559 Vitamin D deficiency, unspecified: Secondary | ICD-10-CM

## 2023-07-30 DIAGNOSIS — E538 Deficiency of other specified B group vitamins: Secondary | ICD-10-CM | POA: Diagnosis not present

## 2023-07-30 DIAGNOSIS — E782 Mixed hyperlipidemia: Secondary | ICD-10-CM | POA: Diagnosis not present

## 2023-07-30 DIAGNOSIS — I679 Cerebrovascular disease, unspecified: Secondary | ICD-10-CM

## 2023-07-30 DIAGNOSIS — E063 Autoimmune thyroiditis: Secondary | ICD-10-CM

## 2023-07-30 DIAGNOSIS — I10 Essential (primary) hypertension: Secondary | ICD-10-CM | POA: Diagnosis not present

## 2023-07-30 DIAGNOSIS — Z1231 Encounter for screening mammogram for malignant neoplasm of breast: Secondary | ICD-10-CM

## 2023-07-30 DIAGNOSIS — Z0000 Encounter for general adult medical examination without abnormal findings: Secondary | ICD-10-CM | POA: Diagnosis not present

## 2023-07-30 DIAGNOSIS — R2681 Unsteadiness on feet: Secondary | ICD-10-CM | POA: Diagnosis not present

## 2023-07-30 LAB — COMPREHENSIVE METABOLIC PANEL WITH GFR
ALT: 6 U/L (ref 0–35)
AST: 13 U/L (ref 0–37)
Albumin: 4.4 g/dL (ref 3.5–5.2)
Alkaline Phosphatase: 80 U/L (ref 39–117)
BUN: 7 mg/dL (ref 6–23)
CO2: 26 meq/L (ref 19–32)
Calcium: 8.8 mg/dL (ref 8.4–10.5)
Chloride: 99 meq/L (ref 96–112)
Creatinine, Ser: 0.76 mg/dL (ref 0.40–1.20)
GFR: 70.32 mL/min (ref >60.00)
Glucose, Bld: 96 mg/dL (ref 70–99)
Potassium: 4.2 meq/L (ref 3.5–5.1)
Sodium: 133 meq/L — ABNORMAL LOW (ref 135–145)
Total Bilirubin: 0.6 mg/dL (ref 0.2–1.2)
Total Protein: 6.5 g/dL (ref 6.0–8.3)

## 2023-07-30 LAB — HEMOGLOBIN A1C: Hgb A1c MFr Bld: 6.1 % (ref 4.6–6.5)

## 2023-07-30 LAB — LIPID PANEL
Cholesterol: 114 mg/dL (ref 0–200)
HDL: 49.1 mg/dL (ref >39.00)
LDL Cholesterol: 48 mg/dL (ref 0–99)
NonHDL: 64.74
Total CHOL/HDL Ratio: 2
Triglycerides: 82 mg/dL (ref 0.0–149.0)
VLDL: 16.4 mg/dL (ref 0.0–40.0)

## 2023-07-30 LAB — CBC
HCT: 39.1 % (ref 36.0–46.0)
Hemoglobin: 13.2 g/dL (ref 12.0–15.0)
MCHC: 33.7 g/dL (ref 30.0–36.0)
MCV: 83.2 fl (ref 78.0–100.0)
Platelets: 236 10*3/uL (ref 150.0–400.0)
RBC: 4.7 Mil/uL (ref 3.87–5.11)
RDW: 13.2 % (ref 11.5–15.5)
WBC: 3.8 10*3/uL — ABNORMAL LOW (ref 4.0–10.5)

## 2023-07-30 LAB — TSH: TSH: 2.6 u[IU]/mL (ref 0.35–5.50)

## 2023-07-30 LAB — VITAMIN D 25 HYDROXY (VIT D DEFICIENCY, FRACTURES): VITD: 27.56 ng/mL — ABNORMAL LOW (ref 30.00–100.00)

## 2023-07-30 MED ORDER — GABAPENTIN 100 MG PO CAPS: 200.0000 mg | ORAL_CAPSULE | Freq: Every day | ORAL | 1 refills | Status: DC

## 2023-07-30 MED ORDER — LEVOTHYROXINE SODIUM 75 MCG PO TABS: 75.0000 ug | ORAL_TABLET | Freq: Every day | ORAL | 3 refills | Status: AC

## 2023-07-30 MED ORDER — LOSARTAN POTASSIUM 25 MG PO TABS: 12.5000 mg | ORAL_TABLET | Freq: Every day | ORAL | 1 refills | Status: DC

## 2023-07-30 MED ORDER — BUDESONIDE-FORMOTEROL FUMARATE 160-4.5 MCG/ACT IN AERO: 1.0000 | INHALATION_SPRAY | Freq: Two times a day (BID) | RESPIRATORY_TRACT | 11 refills | Status: AC

## 2023-07-30 MED ORDER — SIMVASTATIN 10 MG PO TABS: 10.0000 mg | ORAL_TABLET | Freq: Every evening | ORAL | 3 refills | Status: DC

## 2023-07-30 MED ORDER — FEXOFENADINE HCL 180 MG PO TABS: 180.0000 mg | ORAL_TABLET | Freq: Every day | ORAL | 3 refills | Status: DC

## 2023-07-30 MED ORDER — AMLODIPINE BESYLATE 5 MG PO TABS: 5.0000 mg | ORAL_TABLET | Freq: Every day | ORAL | 1 refills | Status: DC

## 2023-07-30 NOTE — Progress Notes (Signed)
 Amy Rodriguez , 1933-12-28, 89 y.o., female MRN: [id number] Patient Care Team    Relationship Specialty Notifications Start End  Natalia Leatherwood, DO PCP - General Family Medicine  01/08/15   Lewayne Bunting, MD PCP - Cardiology Cardiology  10/27/22   Mckinley Jewel, MD Consulting Physician Ophthalmology  08/03/15   Leslye Peer, MD Consulting Physician Pulmonary Disease  08/03/15   Lewayne Bunting, MD Consulting Physician Cardiology  08/03/15   Micki Riley, MD Consulting Physician Neurology  08/03/15   Louis Meckel, MD (Inactive) Consulting Physician Gastroenterology  08/04/15   Carlus Pavlov, MD Consulting Physician Internal Medicine  08/07/16   Marcene Corning, MD Consulting Physician Orthopedic Surgery  08/07/16     Chief Complaint  Patient presents with   Annual Exam    Chronic Conditions/illness Management Pt is fasting.     Subjective: Amy Rodriguez is a 89 y.o. female present for CPE/CMC follow up.   Health maintenance:  Mammogram: fhx in her mother. completed:09/2022, bc-gso> ordered Immunizations: tdap 9/ 2014 UTD- she is aware of date to get at pharmacy, Influenza UTD (encouraged yearly), PNA series completed, zostavax completed, shingrix declined Infectious disease screening: completed Dexa: osteoporosis- UTD 2024- rpt 2 yrs (-2.9). declined prolia and SE to fosamax  Hypertension/hyperlipidemia/overweight/tachycardia/CAD: Patient reports compliance with amlodipine 5 mg daily, Zocor 10 mg every other day, and losartan 12.5 mg.   Patient does have a small MCA aneurysm, SVT, history of stroke/tia and is followed by cardiology.   She had been on atenolol low-dose and this was discontinued recently by cardiology.  She also been tried on lisinopril but noticed a tickle cough and changed to losartan.  Patient denies chest pain, shortness of breath, dizziness or lower extremity edema.   Baby aspirin- pt reports her heart doctor told her she did not need to  continue    B12 deficiency/ Vitamin D deficiency Patient reports she has continued to supplementation. Aneurysm (HCC)/Apical variant hypertrophic cardiomyopathy (HCC) Follows with cardiology  Hypothyroid: Patient reports compliance with levothyroxine 75 g daily.         07/30/2023   10:34 AM 10/25/2022    2:37 PM 11/26/2021   10:50 AM 07/06/2021   10:13 AM 01/19/2021    1:45 PM  Depression screen PHQ 2/9  Decreased Interest 0 0 0 0 0  Down, Depressed, Hopeless 0 1 0 0 0  PHQ - 2 Score 0 1 0 0 0  Altered sleeping 0      Tired, decreased energy 0      Change in appetite 0      Feeling bad or failure about yourself  0      Trouble concentrating 0      Moving slowly or fidgety/restless 0      Suicidal thoughts 0      PHQ-9 Score 0      Difficult doing work/chores Not difficult at all        Allergies  Allergen Reactions   Benzonatate Nausea And Vomiting    "tessalon pearl"   Sulfonamide Derivatives Itching and Rash   Fosamax [Alendronate]    Hctz [Hydrochlorothiazide] Other (See Comments)    Severe hyponatremia    Lisinopril Cough   Social History   Tobacco Use   Smoking status: Former    Current packs/day: 0.00    Average packs/day: 1 pack/day for 25.0 years (25.0 ttl pk-yrs)    Types: Cigarettes    Start date: 04/18/1955  Quit date: 04/17/1980    Years since quitting: 43.3   Smokeless tobacco: Never   Tobacco comments:    passive smoker, husband smoked  Substance Use Topics   Alcohol use: No   Past Medical History:  Diagnosis Date   Arthritis    sed rate 10, RF, CCP pending   Blood in stool    Cervical cancer (HCC)    s/p hysterectomy/oop   DDD (degenerative disc disease), lumbosacral    Depression    Due to husbands passing away 09/22/2011   Diverticulosis of colon    Environmental allergies    GERD (gastroesophageal reflux disease)    H pylori ulcer    not treated due to expense  10/2001   Hand dermatitis    HTN (hypertension)    Hyperlipidemia     Hypothyroidism    TSH 14.488 (11/2005)   IBS (irritable bowel syndrome)    Lactose intolerance    Migraines    "get them very rarely now" (02/08/2012)   Multiple pigmented nevi    last derm evaluation 01/2003   OA (osteoarthritis)    multiple sites   Other seborrheic keratosis    Pain in joint, lower leg    Pneumonia    "couple times in my lifetime" (02/08/2012)   PONV (postoperative nausea and vomiting)    "and takes me a long time to come out under it" (02/08/2012)   Posterior vitreous detachment 1996   Postmenopausal    s/p hysterectomy for h/o cervical cancer 1982 (both ovaries taken at that time) now on hormonal replacement    Postmenopausal HRT (hormone replacement therapy)    PPD positive    history +PPD 1984, no treatment, no abnormal CXR   Shortness of breath    with ambulation   Stroke [facility])    TIA- April 2016 last one   Supraventricular tachycardia, paroxysmal (HCC)    Urinary frequency    Vaginal pruritus    Past Surgical History:  Procedure Laterality Date   ABDOMINAL HYSTERECTOMY  1982   CARDIOVASCULAR STRESS TEST  12/07/11   Normal nuclear stress test   CATARACT EXTRACTION W/ INTRAOCULAR LENS IMPLANT  ?1997   right   COLONOSCOPY     EYE SURGERY     cataract removal right eye   TOTAL ABDOMINAL HYSTERECTOMY W/ BILATERAL SALPINGOOPHORECTOMY  1982   TOTAL KNEE ARTHROPLASTY  02/08/2012   Procedure: TOTAL KNEE ARTHROPLASTY;  Surgeon: Velna Ochs, MD;  Location: [facility] OR;  Service: Orthopedics;  Laterality: Left;   Family History  Problem Relation Age of Onset   Breast cancer Mother 21   Lung cancer Brother 36   Cancer Brother        lung   Heart failure Father        congestive   Heart disease Father    Epilepsy Son    Kidney disease Son        tuberous sclerosis, both kidneys removed, has transplant   Diabetes Maternal Uncle    Diabetes Paternal Aunt    Cancer Maternal Grandmother        breast   Breast cancer Maternal Grandmother    Heart  disease Son        [place]   COPD Son    Rectal cancer Maternal Aunt    Colon cancer Neg Hx    Esophageal cancer Neg Hx    Stomach cancer Neg Hx    Allergies as of 07/30/2023       Reactions  Benzonatate Nausea And Vomiting   "tessalon pearl"   Sulfonamide Derivatives Itching, Rash   Fosamax [alendronate]    Hctz [hydrochlorothiazide] Other (See Comments)   Severe hyponatremia    Lisinopril Cough        Medication List        Accurate as of July 30, 2023 11:04 AM. If you have any questions, ask your nurse or doctor.          STOP taking these medications    predniSONE 20 MG tablet Commonly known as: DELTASONE Stopped by: Felix Pacini   promethazine-dextromethorphan 6.25-15 MG/5ML syrup Commonly known as: PROMETHAZINE-DM Stopped by: Felix Pacini       TAKE these medications    albuterol 108 (90 Base) MCG/ACT inhaler Commonly known as: ProAir HFA Inhale 2 puffs into the lungs every 4 (four) hours as needed for wheezing or shortness of breath.   amLODipine 5 MG tablet Commonly known as: NORVASC Take 1 tablet (5 mg total) by mouth daily.   budesonide-formoterol 160-4.5 MCG/ACT inhaler Commonly known as: Symbicort Inhale 1 puff into the lungs 2 (two) times daily.   cholecalciferol 25 MCG (1000 UNIT) tablet Commonly known as: VITAMIN D3 Take 1,000 Units by mouth daily.   Cyanocobalamin 1000 MCG Lozg Take by mouth.   diclofenac sodium 1 % Gel Commonly known as: Voltaren Apply 2 g topically as needed.   fexofenadine 180 MG tablet Commonly known as: Allegra Allergy Take 1 tablet (180 mg total) by mouth daily.   gabapentin 100 MG capsule Commonly known as: NEURONTIN Take 2 capsules (200 mg total) by mouth at bedtime. What changed: how much to take Changed by: Felix Pacini   levothyroxine 75 MCG tablet Commonly known as: SYNTHROID Take 1 tablet (75 mcg total) by mouth daily before breakfast.   losartan 25 MG tablet Commonly known as:  COZAAR Take 0.5 tablets (12.5 mg total) by mouth daily.   simvastatin 10 MG tablet Commonly known as: ZOCOR Take 1 tablet (10 mg total) by mouth every evening.               Durable Medical Equipment  (From admission, onward)           Start     Ordered   07/30/23 0000  For home use only DME 4 wheeled rolling walker with seat ([id number])       Comments: Moderate fall risk, gait instability  Question:  Patient needs a walker to treat with the following condition  Answer:  Gait apraxia of elderly   07/30/23 1056            No results found for this or any previous visit (from the past 24 hours).  No results found.   ROS: Negative, with the exception of above mentioned in HPI   Objective:  BP 120/60   Pulse 62   Temp 97.6 F (36.4 C)   Ht 5\' 1"  (1.549 m)   Wt 138 lb 6.4 oz (62.8 kg)   SpO2 97%   BMI 26.15 kg/m  Body mass index is 26.15 kg/m. Physical Exam Vitals and nursing note reviewed.  Constitutional:      General: She is not in acute distress.    Appearance: Normal appearance. She is not ill-appearing or toxic-appearing.  HENT:     Head: Normocephalic and atraumatic.     Right Ear: Tympanic membrane, ear canal and external ear normal. There is no impacted cerumen.     Left Ear: Tympanic membrane, ear canal and  external ear normal. There is no impacted cerumen.     Nose: No congestion or rhinorrhea.     Mouth/Throat:     Mouth: Mucous membranes are moist.     Pharynx: Oropharynx is clear. No oropharyngeal exudate or posterior oropharyngeal erythema.  Eyes:     General: No scleral icterus.       Right eye: No discharge.        Left eye: No discharge.     Extraocular Movements: Extraocular movements intact.     Conjunctiva/sclera: Conjunctivae normal.     Pupils: Pupils are equal, round, and reactive to light.  Cardiovascular:     Rate and Rhythm: Normal rate and regular rhythm.     Pulses: Normal pulses.     Heart sounds: Normal heart  sounds. No murmur heard.    No friction rub. No gallop.  Pulmonary:     Effort: Pulmonary effort is normal. No respiratory distress.     Breath sounds: Normal breath sounds. No stridor. No wheezing, rhonchi or rales.  Chest:     Chest wall: No tenderness.  Abdominal:     General: Abdomen is flat. Bowel sounds are normal. There is no distension.     Palpations: Abdomen is soft. There is no mass.     Tenderness: There is no abdominal tenderness. There is no right CVA tenderness, left CVA tenderness, guarding or rebound.     Hernia: No hernia is present.  Musculoskeletal:        General: No swelling, tenderness or deformity. Normal range of motion.     Cervical back: Normal range of motion and neck supple. No rigidity or tenderness.     Right lower leg: No edema.     Left lower leg: No edema.  Lymphadenopathy:     Cervical: No cervical adenopathy.  Skin:    General: Skin is warm and dry.     Coloration: Skin is not jaundiced or pale.     Findings: No bruising, erythema, lesion or rash.  Neurological:     General: No focal deficit present.     Mental Status: She is alert and oriented to person, place, and time. Mental status is at baseline.     Cranial Nerves: No cranial nerve deficit.     Sensory: No sensory deficit.     Motor: No weakness.     Coordination: Coordination normal.     Gait: Gait normal.     Deep Tendon Reflexes: Reflexes normal.  Psychiatric:        Mood and Affect: Mood normal.        Behavior: Behavior normal.        Thought Content: Thought content normal.        Judgment: Judgment normal.     Assessment/Plan: Amy Rodriguez is a 89 y.o. female present for OV for follow-up on CPE and Chronic Conditions/illness Management Routine general medical examination at a health care facility Patient was encouraged to exercise greater than 150 minutes a week. Patient was encouraged to choose a diet filled with fresh fruits and vegetables, and lean meats. AVS provided  to patient today for education/recommendation on gender specific health and safety maintenance. Mammogram: fhx in her mother. completed:09/2022, bc-gso> ordered Immunizations: tdap 9/ 2014 UTD- she is aware of date to get at pharmacy, Influenza UTD (encouraged yearly), PNA series completed, zostavax completed, shingrix declined Infectious disease screening: completed Dexa: osteoporosis- UTD 09/2020- rpt 2 yrs (-2.9). declined prolia and SE to fosamax  SVT/TIA  history/small MCA aneurysm/hypertension/hyperlipidemia/CVD/overweight Stable Continue amlodipine 5 mg daily. Continue losartan 12.5 mg daily. -Was on low-dose beta-blocker that has been discontinued secondary to decreased energy and low heart rate. Continue routine follow-ups with cardiology Labs: CBC, cmp, tsh, lipids collected today -Continue simvastatin 10 mg QD  hypothyroidism TSH collected today -continue  levothyroxine 75 mcg daily. refills will be provided in appropriate dose based on lab result today  vitamin D deficiency/B12 deficiency: Currently taking vitamin D 1000 units and B12 sublingual 1000 Labs greatly improved.   Vitamin D and B12 levels collected today  Aneurysm (HCC)/Apical variant hypertrophic cardiomyopathy (HCC) Follows w/ cardio Gait instability/At moderate risk for fall - For home use only DME 4 wheeled rolling walker with seat ([id number])  Breast cancer screening by mammogram - MM 3D SCREENING MAMMOGRAM BILATERAL BREAST; Future  Return in about 26 weeks (around 01/28/2024) for Routine chronic condition follow-up.   Reviewed expectations re: course of current medical issues. Discussed self-management of symptoms. Outlined signs and symptoms indicating need for more acute intervention. Patient verbalized understanding and all questions were answered. Patient received an After-Visit Summary.    Orders Placed This Encounter  Procedures   For home use only DME 4 wheeled rolling walker with seat  ([id number])   MM 3D SCREENING MAMMOGRAM BILATERAL BREAST   CBC   Hemoglobin A1c   Comprehensive metabolic panel with GFR   Lipid panel   TSH   Vitamin D (25 hydroxy)   Meds ordered this encounter  Medications   amLODipine (NORVASC) 5 MG tablet    Sig: Take 1 tablet (5 mg total) by mouth daily.    Dispense:  90 tablet    Refill:  1   fexofenadine (ALLEGRA ALLERGY) 180 MG tablet    Sig: Take 1 tablet (180 mg total) by mouth daily.    Dispense:  90 tablet    Refill:  3   levothyroxine (SYNTHROID) 75 MCG tablet    Sig: Take 1 tablet (75 mcg total) by mouth daily before breakfast.    Dispense:  90 tablet    Refill:  3   losartan (COZAAR) 25 MG tablet    Sig: Take 0.5 tablets (12.5 mg total) by mouth daily.    Dispense:  45 tablet    Refill:  1    If you could, would you please cut these in half for her. She has trouble doing it herself.   simvastatin (ZOCOR) 10 MG tablet    Sig: Take 1 tablet (10 mg total) by mouth every evening.    Dispense:  45 tablet    Refill:  3   gabapentin (NEURONTIN) 100 MG capsule    Sig: Take 2 capsules (200 mg total) by mouth at bedtime.    Dispense:  180 capsule    Refill:  1   budesonide-formoterol (SYMBICORT) 160-4.5 MCG/ACT inhaler    Sig: Inhale 1 puff into the lungs 2 (two) times daily.    Dispense:  6 g    Refill:  11     electronically signed by:  Napolean Backbone, DO  [facility] - OR

## 2023-07-30 NOTE — Patient Instructions (Addendum)
 Return in about 26 weeks (around 01/28/2024) for Routine chronic condition follow-up.        Great to see you today.  I have refilled the medication(s) we provide.   If labs were collected or images ordered, we will inform you of  results once we have received them and reviewed. We will contact you either by echart message, or telephone call.  Please give ample time to the testing facility, and our office to run,  receive and review results. Please do not call inquiring of results, even if you can see them in your chart. We will contact you as soon as we are able. If it has been over 1 week since the test was completed, and you have not yet heard from us , then please call us .    - echart message- for normal results that have been seen by the patient already.   - telephone call: abnormal results or if patient has not viewed results in their echart.  If a referral to a specialist was entered for you, please call us  in 2 weeks if you have not heard from the specialist office to schedule.

## 2023-07-31 ENCOUNTER — Encounter: Payer: Self-pay | Admitting: Family Medicine

## 2023-10-02 ENCOUNTER — Other Ambulatory Visit: Payer: Self-pay | Admitting: Pharmacist

## 2023-10-02 ENCOUNTER — Encounter: Payer: Self-pay | Admitting: Pharmacist

## 2023-10-02 MED ORDER — SIMVASTATIN 10 MG PO TABS: 10.0000 mg | ORAL_TABLET | Freq: Every evening | ORAL | 1 refills | Status: DC

## 2023-10-02 NOTE — Progress Notes (Signed)
 Pharmacy Quality Measure Review  This patient is appearing on a report for being at risk of failing the adherence measure for cholesterol (statin) medications this calendar year.   Medication: simvastatin   Last fill date: 07/30/2023 for 30 day supply per adherence report. But filled for 30 days 08/31/2023.   Patient was also close to failing Med Adherence for hypertension in 2024. Losartan  Rx was updated in 2024 and she has filled on time in 2025 - 90 day supply on 06/06/2023 and 09/03/2023  Patient asked if she could get rx for 90 DS for simvastatin . Next appt with PCP is 01/2024. Rx sent to pharmacy for #90 with 1 refill to last until next OV.   Cecilie Coffee, PharmD Clinical Pharmacist [facility]  Population Health [phone]

## 2023-10-16 ENCOUNTER — Other Ambulatory Visit: Payer: Self-pay | Admitting: Family Medicine

## 2023-12-07 ENCOUNTER — Ambulatory Visit
Admission: RE | Admit: 2023-12-07 | Discharge: 2023-12-07 | Disposition: A | Discharge status: Home / Self Care | Source: Ambulatory Visit | Attending: Family Medicine | Admitting: Family Medicine

## 2023-12-07 DIAGNOSIS — Z1231 Encounter for screening mammogram for malignant neoplasm of breast: Secondary | ICD-10-CM

## 2023-12-11 DIAGNOSIS — H02831 Dermatochalasis of right upper eyelid: Secondary | ICD-10-CM | POA: Diagnosis not present

## 2023-12-11 DIAGNOSIS — H02834 Dermatochalasis of left upper eyelid: Secondary | ICD-10-CM | POA: Diagnosis not present

## 2023-12-11 DIAGNOSIS — H25812 Combined forms of age-related cataract, left eye: Secondary | ICD-10-CM | POA: Diagnosis not present

## 2023-12-11 DIAGNOSIS — H5213 Myopia, bilateral: Secondary | ICD-10-CM | POA: Diagnosis not present

## 2023-12-12 ENCOUNTER — Ambulatory Visit: Payer: Self-pay | Admitting: Family Medicine

## 2024-01-03 NOTE — Progress Notes (Signed)
 HPI: FU SVT. Nuclear study in August of 2013 showed an ejection fraction of 76% and normal perfusion. Holter 12/14 showed sinus with pacs, pvcs and rare couplet. Carotid dopplers 8/18 showed 1-39 bilateral stenosis. Echocardiogram June 2021 showed normal LV function, mild left ventricular hypertrophy, mild RV dysfunction, trace aortic insufficiency and possible small PFO.  There was also question of apical hypertrophic cardiomyopathy and hypertrabeculation of the RV apex.  Since last seen, the patient has dyspnea with more extreme activities but not with routine activities. It is relieved with rest. It is not associated with chest pain. There is no orthopnea, PND or pedal edema. There is no syncope or palpitations. There is no exertional chest pain.   Current Outpatient Medications  Medication Sig Dispense Refill   albuterol  (VENTOLIN  HFA) 108 (90 Base) MCG/ACT inhaler INHALE 2 PUFFS INTO THE LUNGS UP TO EVERY 4 HOURS AS NEEDED FOR WHEEZING/SHORTNESS OF BREATH. 8.5 each 0   amLODipine  (NORVASC ) 5 MG tablet Take 1 tablet (5 mg total) by mouth daily. 90 tablet 1   cholecalciferol  (VITAMIN D3) 25 MCG (1000 UNIT) tablet Take 1,000 Units by mouth daily.     Cyanocobalamin  1000 MCG LOZG Take by mouth.     diclofenac  sodium (VOLTAREN ) 1 % GEL Apply 2 g topically as needed. 100 g 11   gabapentin  (NEURONTIN ) 100 MG capsule Take 2 capsules (200 mg total) by mouth at bedtime. 180 capsule 1   levothyroxine  (SYNTHROID ) 75 MCG tablet Take 1 tablet (75 mcg total) by mouth daily before breakfast. 90 tablet 3   losartan  (COZAAR ) 25 MG tablet Take 0.5 tablets (12.5 mg total) by mouth daily. 45 tablet 1   simvastatin  (ZOCOR ) 10 MG tablet Take 1 tablet (10 mg total) by mouth every evening. 90 tablet 1   budesonide -formoterol  (SYMBICORT ) 160-4.5 MCG/ACT inhaler Inhale 1 puff into the lungs 2 (two) times daily. (Patient not taking: Reported on 01/14/2024) 6 g 11   No current facility-administered medications for  this visit.     Past Medical History:  Diagnosis Date   Arthritis    sed rate 10, RF, CCP pending   Blood in stool    Cervical cancer (HCC)    s/p hysterectomy/oop   DDD (degenerative disc disease), lumbosacral    Depression    Due to husbands passing away 09/22/2011   Diverticulosis of colon    Environmental allergies    GERD (gastroesophageal reflux disease)    H pylori ulcer    not treated due to expense  10/2001   Hand dermatitis    HTN (hypertension)    Hyperlipidemia    Hypothyroidism    TSH 14.488 (11/2005)   IBS (irritable bowel syndrome)    Lactose intolerance    Migraines    get them very rarely now (02/08/2012)   Multiple pigmented nevi    last derm evaluation 01/2003   OA (osteoarthritis)    multiple sites   Other seborrheic keratosis    Pain in joint, lower leg    Pneumonia    couple times in my lifetime (02/08/2012)   PONV (postoperative nausea and vomiting)    and takes me a long time to come out under it (02/08/2012)   Posterior vitreous detachment 1996   Postmenopausal    s/p hysterectomy for h/o cervical cancer 1982 (both ovaries taken at that time) now on hormonal replacement    Postmenopausal HRT (hormone replacement therapy)    PPD positive    history +PPD 1984, no  treatment, no abnormal CXR   Shortness of breath    with ambulation   Stroke [facility])    TIA- April 2016 last one   Supraventricular tachycardia, paroxysmal    Urinary frequency    Vaginal pruritus     Past Surgical History:  Procedure Laterality Date   ABDOMINAL HYSTERECTOMY  1982   CARDIOVASCULAR STRESS TEST  12/07/11   Normal nuclear stress test   CATARACT EXTRACTION W/ INTRAOCULAR LENS IMPLANT  ?1997   right   COLONOSCOPY     EYE SURGERY     cataract removal right eye   TOTAL ABDOMINAL HYSTERECTOMY W/ BILATERAL SALPINGOOPHORECTOMY  1982   TOTAL KNEE ARTHROPLASTY  02/08/2012   Procedure: TOTAL KNEE ARTHROPLASTY;  Surgeon: Maude KANDICE Herald, MD;  Location: [facility] OR;   Service: Orthopedics;  Laterality: Left;    Social History   Socioeconomic History   Marital status: Widowed    Spouse name: Not on file   Number of children: 3   Years of education: 12TH   Highest education level: 12th grade  Occupational History   Occupation: retired    Comment: Airline pilot: RETIRED  Tobacco Use   Smoking status: Former    Current packs/day: 0.00    Average packs/day: 1 pack/day for 25.0 years (25.0 ttl pk-yrs)    Types: Cigarettes    Start date: 04/18/1955    Quit date: 04/17/1980    Years since quitting: 43.7   Smokeless tobacco: Never   Tobacco comments:    passive smoker, husband smoked  Vaping Use   Vaping status: Never Used  Substance and Sexual Activity   Alcohol use: No   Drug use: No   Sexual activity: Never  Other Topics Concern   Not on file  Social History Narrative   Lives with son, Jacques who has medical problems-they help each other. Her husband died October 06, 2023 after 39 years of marriage.  She has 2 other sons that live within 2 hrs from her. She does not drive-she uses [facility]. She used to work in Engineering geologist, retired.     Tobacco: 25 pack yr hx, quit 1985.   Social Drivers of Corporate investment banker Strain: Low Risk  (01/11/2023)   Overall Financial Resource Strain (CARDIA)    Difficulty of Paying Living Expenses: Not hard at all  Food Insecurity: No Food Insecurity (01/11/2023)   Hunger Vital Sign    Worried About Running Out of Food in the Last Year: Never true    Ran Out of Food in the Last Year: Never true  Transportation Needs: No Transportation Needs (01/11/2023)   PRAPARE - Administrator, Civil Service (Medical): No    Lack of Transportation (Non-Medical): No  Physical Activity: Inactive (01/11/2023)   Exercise Vital Sign    Days of Exercise per Week: 0 days    Minutes of Exercise per Session: 0 min  Stress: No Stress Concern Present (01/11/2023)   [employer] of Occupational Health - Occupational  Stress Questionnaire    Feeling of Stress : Only a little  Recent Concern: Stress - Stress Concern Present (10/25/2022)   [employer] of Occupational Health - Occupational Stress Questionnaire    Feeling of Stress : To some extent  Social Connections: Moderately Isolated (01/11/2023)   Social Connection and Isolation Panel    Frequency of Communication with Friends and Family: More than three times a week    Frequency of Social Gatherings with Friends and Family:  Once a week    Attends Religious Services: More than 4 times per year    Active Member of Clubs or Organizations: No    Attends Banker Meetings: Never    Marital Status: Widowed  Intimate Partner Violence: Not At Risk (10/25/2022)   Humiliation, Afraid, Rape, and Kick questionnaire    Fear of Current or Ex-Partner: No    Emotionally Abused: No    Physically Abused: No    Sexually Abused: No    Family History  Problem Relation Age of Onset   Breast cancer Mother 61   Lung cancer Brother 26   Cancer Brother        lung   Heart failure Father        congestive   Heart disease Father    Epilepsy Son    Kidney disease Son        tuberous sclerosis, both kidneys removed, has transplant   Diabetes Maternal Uncle    Diabetes Paternal Aunt    Cancer Maternal Grandmother        breast   Breast cancer Maternal Grandmother    Heart disease Son        [place]   COPD Son    Rectal cancer Maternal Aunt    Colon cancer Neg Hx    Esophageal cancer Neg Hx    Stomach cancer Neg Hx     ROS: no fevers or chills, productive cough, hemoptysis, dysphasia, odynophagia, melena, hematochezia, dysuria, hematuria, rash, seizure activity, orthopnea, PND, pedal edema, claudication. Remaining systems are negative.  Physical Exam: Well-developed well-nourished in no acute distress.  Skin is warm and dry.  HEENT is normal.  Neck is supple.  Chest is clear to auscultation with normal expansion.  Cardiovascular  exam is regular rate and rhythm.  Abdominal exam nontender or distended. No masses palpated. Extremities show no edema. neuro grossly intact  EKG Interpretation Date/Time:  Monday January 14 2024 10:17:09 EDT Ventricular Rate:  58 PR Interval:  174 QRS Duration:  120 QT Interval:  478 QTC Calculation: 469 R Axis:   7  Text Interpretation: Sinus bradycardia Right bundle branch block Confirmed by Pietro Rogue ([id number]) on 01/14/2024 10:17:59 AM    A/P  1 history of SVT-patient has had no recurrences since last office visit.  Beta-blocker discontinued previously secondary to bradycardia.  2 hypertension-patient's blood pressure is controlled.  Continue present medications.  3 hyperlipidemia-continue statin.  4 history of lower extremity edema-continue compression hose.  5 carotid artery disease-mild on most recent Dopplers.  Rogue Pietro, MD

## 2024-01-14 ENCOUNTER — Encounter: Payer: Self-pay | Admitting: Cardiology

## 2024-01-14 ENCOUNTER — Ambulatory Visit: Attending: Cardiology | Admitting: Cardiology

## 2024-01-14 VITALS — BP 130/70 | HR 58 | Ht 60.0 in | Wt 136.8 lb

## 2024-01-14 DIAGNOSIS — E7800 Pure hypercholesterolemia, unspecified: Secondary | ICD-10-CM

## 2024-01-14 DIAGNOSIS — I10 Essential (primary) hypertension: Secondary | ICD-10-CM | POA: Diagnosis not present

## 2024-01-14 DIAGNOSIS — I471 Supraventricular tachycardia, unspecified: Secondary | ICD-10-CM | POA: Diagnosis not present

## 2024-01-14 NOTE — Patient Instructions (Signed)

## 2024-01-29 ENCOUNTER — Ambulatory Visit: Admitting: Family Medicine

## 2024-01-30 ENCOUNTER — Encounter: Payer: Self-pay | Admitting: Family Medicine

## 2024-01-30 ENCOUNTER — Ambulatory Visit (INDEPENDENT_AMBULATORY_CARE_PROVIDER_SITE_OTHER): Admitting: Family Medicine

## 2024-01-30 VITALS — BP 124/78 | HR 65 | Temp 98.2°F | Wt 136.0 lb

## 2024-01-30 DIAGNOSIS — E559 Vitamin D deficiency, unspecified: Secondary | ICD-10-CM

## 2024-01-30 DIAGNOSIS — I422 Other hypertrophic cardiomyopathy: Secondary | ICD-10-CM | POA: Diagnosis not present

## 2024-01-30 DIAGNOSIS — M51372 Other intervertebral disc degeneration, lumbosacral region with discogenic back pain and lower extremity pain: Secondary | ICD-10-CM | POA: Diagnosis not present

## 2024-01-30 DIAGNOSIS — Z23 Encounter for immunization: Secondary | ICD-10-CM

## 2024-01-30 DIAGNOSIS — Z8673 Personal history of transient ischemic attack (TIA), and cerebral infarction without residual deficits: Secondary | ICD-10-CM | POA: Diagnosis not present

## 2024-01-30 DIAGNOSIS — L600 Ingrowing nail: Secondary | ICD-10-CM

## 2024-01-30 DIAGNOSIS — I10 Essential (primary) hypertension: Secondary | ICD-10-CM

## 2024-01-30 DIAGNOSIS — I729 Aneurysm of unspecified site: Secondary | ICD-10-CM

## 2024-01-30 DIAGNOSIS — R7303 Prediabetes: Secondary | ICD-10-CM

## 2024-01-30 DIAGNOSIS — E063 Autoimmune thyroiditis: Secondary | ICD-10-CM

## 2024-01-30 DIAGNOSIS — E782 Mixed hyperlipidemia: Secondary | ICD-10-CM

## 2024-01-30 DIAGNOSIS — E538 Deficiency of other specified B group vitamins: Secondary | ICD-10-CM

## 2024-01-30 LAB — POCT GLYCOSYLATED HEMOGLOBIN (HGB A1C)
HbA1c POC (<> result, manual entry): 5.3 % (ref 4.0–5.6)
HbA1c, POC (controlled diabetic range): 5.3 % (ref 0.0–7.0)
HbA1c, POC (prediabetic range): 5.3 % — AB (ref 5.7–6.4)
Hemoglobin A1C: 5.3 % (ref 4.0–5.6)

## 2024-01-30 MED ORDER — SIMVASTATIN 10 MG PO TABS: 10.0000 mg | ORAL_TABLET | Freq: Every evening | ORAL | 1 refills | Status: AC

## 2024-01-30 MED ORDER — LOSARTAN POTASSIUM 25 MG PO TABS: 12.5000 mg | ORAL_TABLET | Freq: Every day | ORAL | 1 refills | Status: AC

## 2024-01-30 MED ORDER — PREDNISONE 20 MG PO TABS: ORAL_TABLET | ORAL | 0 refills | Status: AC

## 2024-01-30 MED ORDER — AMLODIPINE BESYLATE 5 MG PO TABS: 5.0000 mg | ORAL_TABLET | Freq: Every day | ORAL | 1 refills | Status: AC

## 2024-01-30 MED ORDER — GABAPENTIN 100 MG PO CAPS: 100.0000 mg | ORAL_CAPSULE | Freq: Every day | ORAL | 1 refills | Status: AC

## 2024-01-30 NOTE — Progress Notes (Signed)
 Amy Rodriguez , 03/21/1934, 89 y.o., female MRN: [id number] Patient Care Team    Relationship Specialty Notifications Start End  Catherine Charlies LABOR, DO PCP - General Family Medicine  01/08/15   Pietro Redell RAMAN, MD PCP - Cardiology Cardiology  10/27/22   Rosan Credit, MD Consulting Physician Ophthalmology  08/03/15   Shelah Lamar RAMAN, MD Consulting Physician Pulmonary Disease  08/03/15   Pietro Redell RAMAN, MD Consulting Physician Cardiology  08/03/15   Rosemarie Eather RAMAN, MD Consulting Physician Neurology  08/03/15   Debrah Lamar BIRCH, MD (Inactive) Consulting Physician Gastroenterology  08/04/15   Trixie File, MD Consulting Physician Internal Medicine  08/07/16   Sheril Coy, MD Consulting Physician Orthopedic Surgery  08/07/16     Chief Complaint  Patient presents with   Hypertension    Chronic Conditions/illness Management     Subjective: Amy Rodriguez is a 89 y.o. female present for chronic condition management Medication reconciliation completed today. Past medical history updated if appropriate.  Hypertension/hyperlipidemia/overweight/tachycardia/CAD: Patient reports compliance with amlodipine  5 mg daily, Zocor  10 mg every other day, and losartan  12.5 mg.   Patient does have a small MCA aneurysm, SVT, history of stroke/tia and is followed by cardiology.   She had been on atenolol  low-dose and this was discontinued recently by cardiology.  She also been tried on lisinopril  but noticed a tickle cough and changed to losartan .  Patient denies chest pain, shortness of breath, dizziness or lower extremity edema.  .   Baby aspirin - pt reports her heart doctor told her she did not need to continue    B12 deficiency/ Vitamin D  deficiency Patient reports she has continued to supplementation. Aneurysm (HCC)/Apical variant hypertrophic cardiomyopathy (HCC) Follows with cardiology  Hypothyroid: Patient reports compliance with levothyroxine  75 g daily.    Patient reports this  morning she woke up with left-sided sciatic discomfort radiating down her leg.  She has had a history of sciatic nerve pain in the past.  Typically responds to heat and does not improve.      07/30/2023   10:34 AM 10/25/2022    2:37 PM 11/26/2021   10:50 AM 07/06/2021   10:13 AM 01/19/2021    1:45 PM  Depression screen PHQ 2/9  Decreased Interest 0 0 0 0 0  Down, Depressed, Hopeless 0 1 0 0 0  PHQ - 2 Score 0 1 0 0 0  Altered sleeping 0      Tired, decreased energy 0      Change in appetite 0      Feeling bad or failure about yourself  0      Trouble concentrating 0      Moving slowly or fidgety/restless 0      Suicidal thoughts 0      PHQ-9 Score 0      Difficult doing work/chores Not difficult at all        Allergies  Allergen Reactions   Benzonatate Nausea And Vomiting    tessalon pearl   Sulfonamide Derivatives Itching and Rash   Fosamax  [Alendronate ]    Hctz [Hydrochlorothiazide ] Other (See Comments)    Severe hyponatremia    Lisinopril  Cough   Social History   Tobacco Use   Smoking status: Former    Current packs/day: 0.00    Average packs/day: 1 pack/day for 25.0 years (25.0 ttl pk-yrs)    Types: Cigarettes    Start date: 04/18/1955    Quit date: 04/17/1980    Years since quitting: 43.8  Smokeless tobacco: Never   Tobacco comments:    passive smoker, husband smoked  Substance Use Topics   Alcohol use: No   Past Medical History:  Diagnosis Date   Arthritis    sed rate 10, RF, CCP pending   Blood in stool    Cervical cancer (HCC)    s/p hysterectomy/oop   DDD (degenerative disc disease), lumbosacral    Depression    Due to husbands passing away 09/22/2011   Diverticulosis of colon    Environmental allergies    GERD (gastroesophageal reflux disease)    H pylori ulcer    not treated due to expense  10/2001   Hand dermatitis    HTN (hypertension)    Hyperlipidemia    Hypothyroidism    TSH 14.488 (11/2005)   IBS (irritable bowel syndrome)    Lactose  intolerance    Migraines    get them very rarely now (02/08/2012)   Multiple pigmented nevi    last derm evaluation 01/2003   OA (osteoarthritis)    multiple sites   Other seborrheic keratosis    Pain in joint, lower leg    Pneumonia    couple times in my lifetime (02/08/2012)   PONV (postoperative nausea and vomiting)    and takes me a long time to come out under it (02/08/2012)   Posterior vitreous detachment 1996   Postmenopausal    s/p hysterectomy for h/o cervical cancer 1982 (both ovaries taken at that time) now on hormonal replacement    Postmenopausal HRT (hormone replacement therapy)    PPD positive    history +PPD 1984, no treatment, no abnormal CXR   Shortness of breath    with ambulation   Simple partial seizure with special sensory symptoms (HCC) 11/04/2013   Stroke [facility])    TIA- April 2016 last one   Supraventricular tachycardia, paroxysmal    TACHYCARDIA, PAROXYSMAL SUPRAVENTRICULAR 06/14/2006   Managed by atenolol - Dr. Edith, caridology     Urinary frequency    Vaginal pruritus    Past Surgical History:  Procedure Laterality Date   ABDOMINAL HYSTERECTOMY  1982   CARDIOVASCULAR STRESS TEST  12/07/11   Normal nuclear stress test   CATARACT EXTRACTION W/ INTRAOCULAR LENS IMPLANT  ?1997   right   COLONOSCOPY     EYE SURGERY     cataract removal right eye   TOTAL ABDOMINAL HYSTERECTOMY W/ BILATERAL SALPINGOOPHORECTOMY  1982   TOTAL KNEE ARTHROPLASTY  02/08/2012   Procedure: TOTAL KNEE ARTHROPLASTY;  Surgeon: Maude KANDICE Herald, MD;  Location: [facility] OR;  Service: Orthopedics;  Laterality: Left;   Family History  Problem Relation Age of Onset   Breast cancer Mother 16   Lung cancer Brother 29   Cancer Brother        lung   Heart failure Father        congestive   Heart disease Father    Epilepsy Son    Kidney disease Son        tuberous sclerosis, both kidneys removed, has transplant   Diabetes Maternal Uncle    Diabetes Paternal Aunt    Cancer  Maternal Grandmother        breast   Breast cancer Maternal Grandmother    Heart disease Son        [place] white   COPD Son    Rectal cancer Maternal Aunt    Colon cancer Neg Hx    Esophageal cancer Neg Hx    Stomach cancer Neg Hx  Allergies as of 01/30/2024       Reactions   Benzonatate Nausea And Vomiting   tessalon pearl   Sulfonamide Derivatives Itching, Rash   Fosamax  [alendronate ]    Hctz [hydrochlorothiazide ] Other (See Comments)   Severe hyponatremia    Lisinopril  Cough        Medication List        Accurate as of January 30, 2024  3:12 PM. If you have any questions, ask your nurse or doctor.          albuterol  108 (90 Base) MCG/ACT inhaler Commonly known as: VENTOLIN  HFA INHALE 2 PUFFS INTO THE LUNGS UP TO EVERY 4 HOURS AS NEEDED FOR WHEEZING/SHORTNESS OF BREATH.   amLODipine  5 MG tablet Commonly known as: NORVASC  Take 1 tablet (5 mg total) by mouth daily.   budesonide -formoterol  160-4.5 MCG/ACT inhaler Commonly known as: Symbicort  Inhale 1 puff into the lungs 2 (two) times daily.   cholecalciferol  25 MCG (1000 UNIT) tablet Commonly known as: VITAMIN D3 Take 1,000 Units by mouth daily.   Cyanocobalamin  1000 MCG Lozg Take by mouth.   diclofenac  sodium 1 % Gel Commonly known as: Voltaren  Apply 2 g topically as needed.   gabapentin  100 MG capsule Commonly known as: NEURONTIN  Take 1 capsule (100 mg total) by mouth at bedtime. What changed: how much to take Changed by: Charlies Bellini   levothyroxine  75 MCG tablet Commonly known as: SYNTHROID  Take 1 tablet (75 mcg total) by mouth daily before breakfast.   losartan  25 MG tablet Commonly known as: COZAAR  Take 0.5 tablets (12.5 mg total) by mouth daily.   predniSONE  20 MG tablet Commonly known as: DELTASONE  60 mg x2d, 40 mg x4d, 20 mg x2d, 10 mg x2d Started by: Charlies Bellini   simvastatin  10 MG tablet Commonly known as: ZOCOR  Take 1 tablet (10 mg total) by mouth every evening.         Results for orders placed or performed in visit on 01/30/24 (from the past 24 hours)  POCT HgB A1C     Status: Abnormal   Collection Time: 01/30/24  1:48 PM  Result Value Ref Range   Hemoglobin A1C 5.3 4.0 - 5.6 %   HbA1c POC (<> result, manual entry) 5.3 4.0 - 5.6 %   HbA1c, POC (prediabetic range) 5.3 (A) 5.7 - 6.4 %   HbA1c, POC (controlled diabetic range) 5.3 0.0 - 7.0 %    No results found.   ROS: Negative, with the exception of above mentioned in HPI   Objective:  BP 124/78   Pulse 65   Temp 98.2 F (36.8 C)   Wt 136 lb (61.7 kg)   SpO2 97%   BMI 26.56 kg/m  Body mass index is 26.56 kg/m. Physical Exam Vitals and nursing note reviewed.  Constitutional:      General: She is not in acute distress.    Appearance: Normal appearance. She is not ill-appearing, toxic-appearing or diaphoretic.     Comments: Walking with a cane today  HENT:     Head: Normocephalic and atraumatic.  Eyes:     General: No scleral icterus.       Right eye: No discharge.        Left eye: No discharge.     Extraocular Movements: Extraocular movements intact.     Conjunctiva/sclera: Conjunctivae normal.     Pupils: Pupils are equal, round, and reactive to light.  Cardiovascular:     Rate and Rhythm: Normal rate and regular rhythm.  Heart sounds: No murmur heard. Pulmonary:     Effort: Pulmonary effort is normal. No respiratory distress.     Breath sounds: Normal breath sounds. No wheezing, rhonchi or rales.  Musculoskeletal:        General: Tenderness (Left large toe) present. No swelling.     Right lower leg: No edema.     Left lower leg: No edema.     Comments: TTP over sciatic nerve  Skin:    General: Skin is warm.     Findings: No erythema or rash.  Neurological:     Mental Status: She is alert and oriented to person, place, and time. Mental status is at baseline.     Motor: No weakness.     Gait: Gait normal.  Psychiatric:        Mood and Affect: Mood normal.         Behavior: Behavior normal.        Thought Content: Thought content normal.        Judgment: Judgment normal.    Assessment/Plan: Amy Rodriguez is a 89 y.o. female present for OV for chronic Conditions/illness Management SVT/TIA history/small MCA aneurysm/hypertension/hyperlipidemia/CVD/overweight Stable Continue amlodipine  5 mg daily. Continue losartan  12.5 mg daily. -Was on low-dose beta-blocker that has been discontinued secondary to decreased energy and low heart rate. Continue routine follow-ups with cardiology Labs: UTD -Continue simvastatin  10 mg QD  hypothyroidism stable Continue   levothyroxine  75 mcg daily.  Labs due next visit  vitamin D  deficiency/B12 deficiency: Currently taking vitamin D  1000 units and B12 sublingual 1000  Vitamin D  and B12 levels UTD  Aneurysm (HCC)/Apical variant hypertrophic cardiomyopathy (HCC) Follows w/ cardio  Prediabetes (Primary) - POCT HgB A1C> 5.3 today, much improved.  Influenza vaccine needed - Flu vaccine HIGH DOSE PF(Fluzone Trivalent)  Ingrown toenail Left large toe ingrown toenail reported with elongated nails. - Ambulatory referral to Podiatry   Degeneration of intervertebral disc of lumbosacral region with discogenic back pain and lower extremity pain Encouraged rest, heat application as sciatic nerve. Prednisone  taper prescribed Follow-up in 2-4 weeks if needed only.    Return in about 6 months (around 07/30/2024) for cpe (20 min), Routine chronic condition follow-up.   Reviewed expectations re: course of current medical issues. Discussed self-management of symptoms. Outlined signs and symptoms indicating need for more acute intervention. Patient verbalized understanding and all questions were answered. Patient received an After-Visit Summary.    Orders Placed This Encounter  Procedures   Flu vaccine HIGH DOSE PF(Fluzone Trivalent)   Ambulatory referral to Podiatry   POCT HgB A1C   Meds ordered this  encounter  Medications   amLODipine  (NORVASC ) 5 MG tablet    Sig: Take 1 tablet (5 mg total) by mouth daily.    Dispense:  90 tablet    Refill:  1   gabapentin  (NEURONTIN ) 100 MG capsule    Sig: Take 1 capsule (100 mg total) by mouth at bedtime.    Dispense:  90 capsule    Refill:  1   losartan  (COZAAR ) 25 MG tablet    Sig: Take 0.5 tablets (12.5 mg total) by mouth daily.    Dispense:  45 tablet    Refill:  1    If you could, would you please cut these in half for her. She has trouble doing it herself.   simvastatin  (ZOCOR ) 10 MG tablet    Sig: Take 1 tablet (10 mg total) by mouth every evening.    Dispense:  90  tablet    Refill:  1   predniSONE  (DELTASONE ) 20 MG tablet    Sig: 60 mg x2d, 40 mg x4d, 20 mg x2d, 10 mg x2d    Dispense:  17 tablet    Refill:  0     electronically signed by:  Charlies Bellini, DO  [facility] - OR

## 2024-01-30 NOTE — Patient Instructions (Signed)

## 2024-02-06 ENCOUNTER — Ambulatory Visit: Admitting: Podiatry

## 2024-02-07 ENCOUNTER — Ambulatory Visit: Admitting: Podiatry

## 2024-02-14 ENCOUNTER — Ambulatory Visit: Admitting: Podiatry

## 2024-02-27 ENCOUNTER — Ambulatory Visit (INDEPENDENT_AMBULATORY_CARE_PROVIDER_SITE_OTHER)

## 2024-02-27 VITALS — Ht 60.0 in | Wt 136.0 lb

## 2024-02-27 DIAGNOSIS — Z0000 Encounter for general adult medical examination without abnormal findings: Secondary | ICD-10-CM | POA: Diagnosis not present

## 2024-02-27 NOTE — Patient Instructions (Signed)
 Amy Rodriguez,  Thank you for taking the time for your Medicare Wellness Visit. I appreciate your continued commitment to your health goals. Please review the care plan we discussed, and feel free to reach out if I can assist you further.  Please note that Annual Wellness Visits do not include a physical exam. Some assessments may be limited, especially if the visit was conducted virtually. If needed, we may recommend an in-person follow-up with your provider.  Ongoing Care Seeing your primary care provider every 3 to 6 months helps us  monitor your health and provide consistent, personalized care. Last office visit on 01/30/2024.  Each day, aim for 6 glasses of water, plenty of protein in your diet and try to get up and walk/ stretch every hour for 5-10 minutes at a time.    Referrals If a referral was made during today's visit and you haven't received any updates within two weeks, please contact the referred provider directly to check on the status.  Recommended Screenings:  Health Maintenance  Topic Date Due   Breast Cancer Screening  12/06/2024   DEXA scan (bone density measurement)  02/14/2025   Medicare Annual Wellness Visit  02/26/2025   Pneumococcal Vaccine for age over 57  Completed   Flu Shot  Completed   Meningitis B Vaccine  Aged Out   DTaP/Tdap/Td vaccine  Discontinued   COVID-19 Vaccine  Discontinued   Zoster (Shingles) Vaccine  Discontinued       02/27/2024    1:13 PM  Advanced Directives  Does Patient Have a Medical Advance Directive? Yes  Type of Estate Agent of [place];Living will    Vision: Annual vision screenings are recommended for early detection of glaucoma, cataracts, and diabetic retinopathy. These exams can also reveal signs of chronic conditions such as diabetes and high blood pressure.  Dental: Annual dental screenings help detect early signs of oral cancer, gum disease, and other conditions linked to overall health, including heart  disease and diabetes.  Please see the attached documents for additional preventive care recommendations.

## 2024-02-27 NOTE — Progress Notes (Signed)
 Chief Complaint  Patient presents with   Medicare Wellness     Subjective:   Amy Rodriguez is a 89 y.o. female who presents for a Medicare Annual Wellness Visit.  Allergies (verified) Benzonatate, Sulfonamide derivatives, Fosamax  [alendronate ], Hctz [hydrochlorothiazide ], and Lisinopril    History: Past Medical History:  Diagnosis Date   Arthritis    sed rate 10, RF, CCP pending   Blood in stool    Cervical cancer (HCC)    s/p hysterectomy/oop   DDD (degenerative disc disease), lumbosacral    Depression    Due to husbands passing away 09/22/2011   Diverticulosis of colon    Environmental allergies    GERD (gastroesophageal reflux disease)    H pylori ulcer    not treated due to expense  10/2001   Hand dermatitis    HTN (hypertension)    Hyperlipidemia    Hypothyroidism    TSH 14.488 (11/2005)   IBS (irritable bowel syndrome)    Lactose intolerance    Migraines    get them very rarely now (02/08/2012)   Multiple pigmented nevi    last derm evaluation 01/2003   OA (osteoarthritis)    multiple sites   Other seborrheic keratosis    Pain in joint, lower leg    Pneumonia    couple times in my lifetime (02/08/2012)   PONV (postoperative nausea and vomiting)    and takes me a long time to come out under it (02/08/2012)   Posterior vitreous detachment 1996   Postmenopausal    s/p hysterectomy for h/o cervical cancer 1982 (both ovaries taken at that time) now on hormonal replacement    Postmenopausal HRT (hormone replacement therapy)    PPD positive    history +PPD 1984, no treatment, no abnormal CXR   Shortness of breath    with ambulation   Simple partial seizure with special sensory symptoms (HCC) 11/04/2013   Stroke [facility])    TIA- April 2016 last one   Supraventricular tachycardia, paroxysmal    TACHYCARDIA, PAROXYSMAL SUPRAVENTRICULAR 06/14/2006   Managed by atenolol - Dr. Edith, caridology     Urinary frequency    Vaginal pruritus    Past Surgical  History:  Procedure Laterality Date   ABDOMINAL HYSTERECTOMY  1982   CARDIOVASCULAR STRESS TEST  12/07/11   Normal nuclear stress test   CATARACT EXTRACTION W/ INTRAOCULAR LENS IMPLANT  ?1997   right   COLONOSCOPY     EYE SURGERY     cataract removal right eye   TOTAL ABDOMINAL HYSTERECTOMY W/ BILATERAL SALPINGOOPHORECTOMY  1982   TOTAL KNEE ARTHROPLASTY  02/08/2012   Procedure: TOTAL KNEE ARTHROPLASTY;  Surgeon: Maude KANDICE Herald, MD;  Location: [facility] OR;  Service: Orthopedics;  Laterality: Left;   Family History  Problem Relation Age of Onset   Breast cancer Mother 51   Lung cancer Brother 42   Cancer Brother        lung   Heart failure Father        congestive   Heart disease Father    Epilepsy Son    Kidney disease Son        tuberous sclerosis, both kidneys removed, has transplant   Diabetes Maternal Uncle    Diabetes Paternal Aunt    Cancer Maternal Grandmother        breast   Breast cancer Maternal Grandmother    Heart disease Son        [place] white   COPD Son    Rectal  cancer Maternal Aunt    Colon cancer Neg Hx    Esophageal cancer Neg Hx    Stomach cancer Neg Hx    Social History   Occupational History   Occupation: retired    Comment: [employer]: RETIRED  Tobacco Use   Smoking status: Former    Current packs/day: 0.00    Average packs/day: 1 pack/day for 25.0 years (25.0 ttl pk-yrs)    Types: Cigarettes    Start date: 04/18/1955    Quit date: 04/17/1980    Years since quitting: 43.8   Smokeless tobacco: Never   Tobacco comments:    passive smoker, husband smoked  Vaping Use   Vaping status: Never Used  Substance and Sexual Activity   Alcohol use: No   Drug use: No   Sexual activity: Never   Tobacco Counseling Counseling given: Not Answered Tobacco comments: passive smoker, husband smoked  SDOH Screenings   Food Insecurity: No Food Insecurity (01/11/2023)  Housing: Medium Risk (01/11/2023)  Transportation Needs: No Transportation Needs  (01/11/2023)  Utilities: Not At Risk (10/25/2022)  Alcohol Screen: Low Risk  (11/24/2020)  Depression (PHQ2-9): Low Risk  (02/27/2024)  Financial Resource Strain: Low Risk  (01/11/2023)  Physical Activity: Inactive (02/27/2024)  Social Connections: Moderately Isolated (02/27/2024)  Stress: No Stress Concern Present (02/27/2024)  Tobacco Use: Medium Risk (02/27/2024)  Health Literacy: Adequate Health Literacy (02/27/2024)   Depression Screen    02/27/2024    1:20 PM 07/30/2023   10:34 AM 10/25/2022    2:37 PM 11/26/2021   10:50 AM 07/06/2021   10:13 AM 01/19/2021    1:45 PM 03/23/2020    1:56 PM  PHQ 2/9 Scores  PHQ - 2 Score 0 0 1 0 0 0 0  PHQ- 9 Score 1 0           Data saved with a previous flowsheet row definition      Goals Addressed             This Visit's Progress    Patient Stated   On track    Stay well        Visit info / Clinical Intake: Medicare Wellness Visit Type:: Subsequent Annual Wellness Visit Persons participating in visit:: patient Medicare Wellness Visit Mode:: Telephone If telephone:: video declined Because this visit was a virtual/telehealth visit:: vitals recorded from last visit If Telephone or Video please confirm:: I connected with the patient using audio enabled telemedicine application and verified that I am speaking with the correct person using two identifiers; I discussed the limitations of evaluation and management by telemedicine; The patient expressed understanding and agreed to proceed Patient Location:: Home Provider Location:: Home Information given by:: patient Interpreter Needed?: No Pre-visit prep was completed: no AWV questionnaire completed by patient prior to visit?: no Living arrangements:: with family/others Patient's Overall Health Status Rating: (!) fair Typical amount of pain: some (arthritis pain) Does pain affect daily life?: no Are you currently prescribed opioids?: no  Dietary Habits and Nutritional Risks How many  meals a day?: 2 Eats fruit and vegetables daily?: yes Most meals are obtained by: preparing own meals In the last 2 weeks, have you had any of the following?: none Diabetic:: no  Functional Status Activities of Daily Living (to include ambulation/medication): Independent Ambulation: Independent with device- listed below Home Assistive Devices/Equipment: Johna Finder (specify Type); Eyeglasses Medication Administration: Independent Home Management: Independent Manage your own finances?: yes Primary transportation is: facility / other (medical transportation) Concerns about  vision?: no *vision screening is required for WTM* Concerns about hearing?: (!) yes (hard to hear at times) Uses hearing aids?: no Hear whispered voice?: yes  Fall Screening Falls in the past year?: 0 Number of falls in past year: 0 Was there an injury with Fall?: 0 Fall Risk Category Calculator: 0 Patient Fall Risk Level: Low Fall Risk  Fall Risk Patient at Risk for Falls Due to: No Fall Risks Fall risk Follow up: Falls evaluation completed; Falls prevention discussed  Home and Transportation Safety: All rugs have non-skid backing?: N/A, no rugs All stairs or steps have railings?: N/A, no stairs Grab bars in the bathtub or shower?: (!) no Have non-skid surface in bathtub or shower?: (!) no Good home lighting?: yes Regular seat belt use?: yes Hospital stays in the last year:: no  Cognitive Assessment Difficulty concentrating, remembering, or making decisions? : yes Will 6CIT or Mini Cog be Completed: yes What year is it?: 0 points What month is it?: 0 points Give patient an address phrase to remember (5 components): [address], [place] About what time is it?: 0 points Count backwards from 20 to 1: 0 points Say the months of the year in reverse: 0 points Repeat the address phrase from earlier: 0 points 6 CIT Score: 0 points  Advance Directives (For Healthcare) Does Patient Have a Medical Advance  Directive?: Yes Type of Advance Directive: Healthcare Power of [place]; Living will  Reviewed/Updated  Reviewed/Updated: Reviewed All (Medical, Surgical, Family, Medications, Allergies, Care Teams, Patient Goals)        Objective:    Today's Vitals   02/27/24 1310  Weight: 136 lb (61.7 kg)  Height: 5' (1.524 m)   Body mass index is 26.56 kg/m.  Current Medications (verified) Outpatient Encounter Medications as of 02/27/2024  Medication Sig   albuterol  (VENTOLIN  HFA) 108 (90 Base) MCG/ACT inhaler INHALE 2 PUFFS INTO THE LUNGS UP TO EVERY 4 HOURS AS NEEDED FOR WHEEZING/SHORTNESS OF BREATH.   amLODipine  (NORVASC ) 5 MG tablet Take 1 tablet (5 mg total) by mouth daily.   cholecalciferol  (VITAMIN D3) 25 MCG (1000 UNIT) tablet Take 1,000 Units by mouth daily.   Cyanocobalamin  1000 MCG LOZG Take by mouth.   diclofenac  sodium (VOLTAREN ) 1 % GEL Apply 2 g topically as needed.   gabapentin  (NEURONTIN ) 100 MG capsule Take 1 capsule (100 mg total) by mouth at bedtime.   levothyroxine  (SYNTHROID ) 75 MCG tablet Take 1 tablet (75 mcg total) by mouth daily before breakfast.   losartan  (COZAAR ) 25 MG tablet Take 0.5 tablets (12.5 mg total) by mouth daily.   predniSONE  (DELTASONE ) 20 MG tablet 60 mg x2d, 40 mg x4d, 20 mg x2d, 10 mg x2d   simvastatin  (ZOCOR ) 10 MG tablet Take 1 tablet (10 mg total) by mouth every evening.   budesonide -formoterol  (SYMBICORT ) 160-4.5 MCG/ACT inhaler Inhale 1 puff into the lungs 2 (two) times daily. (Patient not taking: Reported on 02/27/2024)   No facility-administered encounter medications on file as of 02/27/2024.   Hearing/Vision screen Hearing Screening - Comments:: hard to hear at times Vision Screening - Comments:: Wears eyeglasses/[place] [facility]/UTD Immunizations and Health Maintenance Health Maintenance  Topic Date Due   Medicare Annual Wellness (AWV)  10/25/2023   Mammogram  12/06/2024   DEXA SCAN  02/14/2025   Pneumococcal Vaccine: 50+  Years  Completed   Influenza Vaccine  Completed   Meningococcal B Vaccine  Aged Out   DTaP/Tdap/Td  Discontinued   COVID-19 Vaccine  Discontinued   Zoster  Vaccines- Shingrix   Discontinued        Assessment/Plan:  This is a routine wellness examination for [place].  Patient Care Team: Catherine Charlies LABOR, DO as PCP - General (Family Medicine) Pietro Redell RAMAN, MD as PCP - Cardiology (Cardiology) Rosan Credit, MD as Consulting Physician (Ophthalmology) Shelah Lamar RAMAN, MD as Consulting Physician (Pulmonary Disease) Pietro Redell RAMAN, MD as Consulting Physician (Cardiology) Rosemarie Eather RAMAN, MD as Consulting Physician (Neurology) Debrah Lamar BIRCH, MD (Inactive) as Consulting Physician (Gastroenterology) Trixie File, MD as Consulting Physician (Internal Medicine) Sheril Coy, MD as Consulting Physician (Orthopedic Surgery)  I have personally reviewed and noted the following in the patient's chart:   Medical and social history Use of alcohol, tobacco or illicit drugs  Current medications and supplements including opioid prescriptions. Functional ability and status Nutritional status Physical activity Advanced directives List of other physicians Hospitalizations, surgeries, and ER visits in previous 12 months Vitals Screenings to include cognitive, depression, and falls Referrals and appointments  No orders of the defined types were placed in this encounter.  In addition, I have reviewed and discussed with patient certain preventive protocols, quality metrics, and best practice recommendations. A written personalized care plan for preventive services as well as general preventive health recommendations were provided to patient.   Caliope Ruppert L Trenace Coughlin, CMA   02/27/2024   No follow-ups on file.  After Visit Summary: (MyChart) Due to this being a telephonic visit, the after visit summary with patients personalized plan was offered to patient via MyChart   Nurse Notes:  Patient is up to date on all health maintenance with no concerns to address today.

## 2024-07-30 ENCOUNTER — Ambulatory Visit: Admitting: Family Medicine
